# Patient Record
Sex: Female | Born: 1940
Health system: Southern US, Community
[De-identification: ages and names within clinical notes are randomized; demographics above are authoritative.]

## PROBLEM LIST (undated history)

## (undated) DIAGNOSIS — I519 Heart disease, unspecified: Secondary | ICD-10-CM

## (undated) DIAGNOSIS — J189 Pneumonia, unspecified organism: Secondary | ICD-10-CM

## (undated) DIAGNOSIS — I119 Hypertensive heart disease without heart failure: Secondary | ICD-10-CM

## (undated) DIAGNOSIS — R0602 Shortness of breath: Secondary | ICD-10-CM

## (undated) DIAGNOSIS — I10 Essential (primary) hypertension: Secondary | ICD-10-CM

## (undated) DIAGNOSIS — J449 Chronic obstructive pulmonary disease, unspecified: Secondary | ICD-10-CM

## (undated) DIAGNOSIS — E1121 Type 2 diabetes mellitus with diabetic nephropathy: Secondary | ICD-10-CM

## (undated) DIAGNOSIS — N289 Disorder of kidney and ureter, unspecified: Secondary | ICD-10-CM

## (undated) DIAGNOSIS — F329 Major depressive disorder, single episode, unspecified: Secondary | ICD-10-CM

## (undated) DIAGNOSIS — D649 Anemia, unspecified: Secondary | ICD-10-CM

## (undated) DIAGNOSIS — M1990 Unspecified osteoarthritis, unspecified site: Secondary | ICD-10-CM

## (undated) DIAGNOSIS — F32A Depression, unspecified: Secondary | ICD-10-CM

## (undated) DIAGNOSIS — E119 Type 2 diabetes mellitus without complications: Secondary | ICD-10-CM

## (undated) DIAGNOSIS — K436 Other and unspecified ventral hernia with obstruction, without gangrene: Secondary | ICD-10-CM

## (undated) DIAGNOSIS — E1165 Type 2 diabetes mellitus with hyperglycemia: Secondary | ICD-10-CM

## (undated) DIAGNOSIS — F419 Anxiety disorder, unspecified: Secondary | ICD-10-CM

## (undated) DIAGNOSIS — Z8701 Personal history of pneumonia (recurrent): Secondary | ICD-10-CM

## (undated) DIAGNOSIS — I2510 Atherosclerotic heart disease of native coronary artery without angina pectoris: Secondary | ICD-10-CM

## (undated) DIAGNOSIS — K469 Unspecified abdominal hernia without obstruction or gangrene: Secondary | ICD-10-CM

## (undated) DIAGNOSIS — I129 Hypertensive chronic kidney disease with stage 1 through stage 4 chronic kidney disease, or unspecified chronic kidney disease: Secondary | ICD-10-CM

## (undated) HISTORY — DX: Unspecified osteoarthritis, unspecified site: M19.90

## (undated) HISTORY — DX: Essential (primary) hypertension: I10

## (undated) HISTORY — DX: Chronic obstructive pulmonary disease, unspecified: J44.9

## (undated) HISTORY — DX: Anxiety disorder, unspecified: F41.9

## (undated) HISTORY — DX: Depression, unspecified: F32.A

## (undated) HISTORY — DX: Atherosclerotic heart disease of native coronary artery without angina pectoris: I25.10

## (undated) HISTORY — DX: Other and unspecified ventral hernia with obstruction, without gangrene: K43.6

## (undated) HISTORY — DX: Personal history of pneumonia (recurrent): Z87.01

## (undated) HISTORY — DX: Type 2 diabetes mellitus without complications: E11.9

## (undated) HISTORY — PX: NEPHRECTOMY: SHX65

---
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
## 2005-08-30 ENCOUNTER — Emergency Department (HOSPITAL_COMMUNITY): Admission: EM | Admit: 2005-08-30 | Discharge: 2005-08-31 | Payer: Self-pay | Admitting: *Deleted

## 2005-09-01 ENCOUNTER — Ambulatory Visit (HOSPITAL_COMMUNITY): Admission: RE | Admit: 2005-09-01 | Discharge: 2005-09-01 | Payer: Self-pay | Admitting: *Deleted

## 2005-09-08 ENCOUNTER — Encounter (HOSPITAL_COMMUNITY): Admission: RE | Admit: 2005-09-08 | Discharge: 2005-09-08 | Payer: Self-pay | Admitting: Pulmonary Disease

## 2007-07-04 ENCOUNTER — Emergency Department (HOSPITAL_COMMUNITY): Admission: EM | Admit: 2007-07-04 | Discharge: 2007-07-04 | Payer: Self-pay | Admitting: Emergency Medicine

## 2007-07-14 ENCOUNTER — Ambulatory Visit: Payer: Self-pay | Admitting: Cardiovascular Disease

## 2007-07-23 ENCOUNTER — Ambulatory Visit: Payer: Self-pay | Admitting: Internal Medicine

## 2007-07-23 ENCOUNTER — Encounter (HOSPITAL_COMMUNITY): Admission: RE | Admit: 2007-07-23 | Discharge: 2007-08-22 | Payer: Self-pay | Admitting: Cardiovascular Disease

## 2007-07-30 ENCOUNTER — Ambulatory Visit: Payer: Self-pay | Admitting: Gastroenterology

## 2007-08-02 ENCOUNTER — Ambulatory Visit (HOSPITAL_COMMUNITY): Admission: RE | Admit: 2007-08-02 | Discharge: 2007-08-02 | Payer: Self-pay | Admitting: Gastroenterology

## 2007-08-11 ENCOUNTER — Ambulatory Visit: Payer: Self-pay | Admitting: Cardiovascular Disease

## 2007-08-12 ENCOUNTER — Encounter (HOSPITAL_COMMUNITY): Admission: RE | Admit: 2007-08-12 | Discharge: 2007-09-11 | Payer: Self-pay | Admitting: Gastroenterology

## 2007-11-02 ENCOUNTER — Ambulatory Visit: Payer: Self-pay | Admitting: Cardiovascular Disease

## 2008-09-07 ENCOUNTER — Ambulatory Visit: Payer: Self-pay | Admitting: Cardiology

## 2008-12-11 ENCOUNTER — Ambulatory Visit (HOSPITAL_COMMUNITY): Admission: RE | Admit: 2008-12-11 | Discharge: 2008-12-11 | Payer: Self-pay | Admitting: Family Medicine

## 2009-06-13 DIAGNOSIS — E119 Type 2 diabetes mellitus without complications: Secondary | ICD-10-CM

## 2009-06-13 DIAGNOSIS — K219 Gastro-esophageal reflux disease without esophagitis: Secondary | ICD-10-CM | POA: Insufficient documentation

## 2009-06-13 HISTORY — DX: Gastro-esophageal reflux disease without esophagitis: K21.9

## 2009-06-13 HISTORY — DX: Type 2 diabetes mellitus without complications: E11.9

## 2010-01-28 ENCOUNTER — Inpatient Hospital Stay (HOSPITAL_COMMUNITY): Admission: EM | Admit: 2010-01-28 | Discharge: 2010-02-05 | Payer: Self-pay | Admitting: Emergency Medicine

## 2010-05-16 ENCOUNTER — Ambulatory Visit (HOSPITAL_COMMUNITY): Admission: RE | Admit: 2010-05-16 | Discharge: 2010-05-16 | Payer: Self-pay | Admitting: Pulmonary Disease

## 2010-05-30 ENCOUNTER — Emergency Department (HOSPITAL_COMMUNITY): Admission: EM | Admit: 2010-05-30 | Discharge: 2010-05-30 | Payer: Self-pay | Admitting: Emergency Medicine

## 2011-03-02 LAB — HEPATIC FUNCTION PANEL
Bilirubin, Direct: 0.1 mg/dL (ref 0.0–0.3)
Indirect Bilirubin: 0.1 mg/dL — ABNORMAL LOW (ref 0.3–0.9)
Total Bilirubin: 0.2 mg/dL — ABNORMAL LOW (ref 0.3–1.2)

## 2011-03-02 LAB — BASIC METABOLIC PANEL
BUN: 16 mg/dL (ref 6–23)
CO2: 27 mEq/L (ref 19–32)
Calcium: 8.8 mg/dL (ref 8.4–10.5)
Chloride: 104 mEq/L (ref 96–112)
Creatinine, Ser: 1.12 mg/dL (ref 0.4–1.2)
GFR calc non Af Amer: 48 mL/min — ABNORMAL LOW (ref >60)
Potassium: 4.4 mEq/L (ref 3.5–5.1)

## 2011-03-02 LAB — LIPASE, BLOOD: Lipase: 41 U/L (ref 11–59)

## 2011-03-02 LAB — DIFFERENTIAL
Eosinophils Absolute: 0.1 10*3/uL (ref 0.0–0.7)
Eosinophils Relative: 1 % (ref 0–5)
Monocytes Relative: 3 % (ref 3–12)

## 2011-03-02 LAB — URINALYSIS, ROUTINE W REFLEX MICROSCOPIC
Glucose, UA: NEGATIVE mg/dL
Hgb urine dipstick: NEGATIVE
Nitrite: NEGATIVE
Urobilinogen, UA: 0.2 mg/dL (ref 0.0–1.0)

## 2011-03-02 LAB — CBC: Platelets: 268 10*3/uL (ref 150–400)

## 2011-03-05 LAB — URINE CULTURE: Colony Count: NO GROWTH

## 2011-03-05 LAB — GLUCOSE, CAPILLARY
Glucose-Capillary: 120 mg/dL — ABNORMAL HIGH (ref 70–99)
Glucose-Capillary: 141 mg/dL — ABNORMAL HIGH (ref 70–99)
Glucose-Capillary: 198 mg/dL — ABNORMAL HIGH (ref 70–99)
Glucose-Capillary: 200 mg/dL — ABNORMAL HIGH (ref 70–99)
Glucose-Capillary: 214 mg/dL — ABNORMAL HIGH (ref 70–99)
Glucose-Capillary: 234 mg/dL — ABNORMAL HIGH (ref 70–99)
Glucose-Capillary: 256 mg/dL — ABNORMAL HIGH (ref 70–99)
Glucose-Capillary: 267 mg/dL — ABNORMAL HIGH (ref 70–99)
Glucose-Capillary: 284 mg/dL — ABNORMAL HIGH (ref 70–99)
Glucose-Capillary: 303 mg/dL — ABNORMAL HIGH (ref 70–99)
Glucose-Capillary: 305 mg/dL — ABNORMAL HIGH (ref 70–99)
Glucose-Capillary: 329 mg/dL — ABNORMAL HIGH (ref 70–99)
Glucose-Capillary: 335 mg/dL — ABNORMAL HIGH (ref 70–99)
Glucose-Capillary: 343 mg/dL — ABNORMAL HIGH (ref 70–99)
Glucose-Capillary: 364 mg/dL — ABNORMAL HIGH (ref 70–99)
Glucose-Capillary: 368 mg/dL — ABNORMAL HIGH (ref 70–99)
Glucose-Capillary: 392 mg/dL — ABNORMAL HIGH (ref 70–99)
Glucose-Capillary: 413 mg/dL — ABNORMAL HIGH (ref 70–99)

## 2011-03-05 LAB — POCT CARDIAC MARKERS
CKMB, poc: 1.9 ng/mL (ref 1.0–8.0)
Myoglobin, poc: 115 ng/mL (ref 12–200)
Troponin i, poc: <0.05 ng/mL (ref 0.00–0.09)

## 2011-03-05 LAB — URINALYSIS, ROUTINE W REFLEX MICROSCOPIC
Glucose, UA: NEGATIVE mg/dL
Hgb urine dipstick: NEGATIVE
pH: 5 (ref 5.0–8.0)

## 2011-03-05 LAB — BASIC METABOLIC PANEL
BUN: 21 mg/dL (ref 6–23)
Calcium: 8.2 mg/dL — ABNORMAL LOW (ref 8.4–10.5)
Creatinine, Ser: 1.02 mg/dL (ref 0.4–1.2)
GFR calc non Af Amer: 54 mL/min — ABNORMAL LOW (ref >60)
Glucose, Bld: 116 mg/dL — ABNORMAL HIGH (ref 70–99)

## 2011-03-05 LAB — LIPASE, BLOOD: Lipase: 35 U/L (ref 11–59)

## 2011-03-05 LAB — DIFFERENTIAL
Basophils Absolute: 0 10*3/uL (ref 0.0–0.1)
Basophils Absolute: 0 10*3/uL (ref 0.0–0.1)
Eosinophils Relative: 0 % (ref 0–5)
Lymphocytes Relative: 12 % (ref 12–46)
Lymphocytes Relative: 28 % (ref 12–46)
Lymphs Abs: 1 10*3/uL (ref 0.7–4.0)
Neutro Abs: 6.4 10*3/uL (ref 1.7–7.7)
Neutro Abs: 7.7 10*3/uL (ref 1.7–7.7)

## 2011-03-05 LAB — CBC
Hemoglobin: 12.8 g/dL (ref 12.0–15.0)
Platelets: 254 10*3/uL (ref 150–400)
Platelets: 282 10*3/uL (ref 150–400)
RDW: 13.4 % (ref 11.5–15.5)

## 2011-03-05 LAB — COMPREHENSIVE METABOLIC PANEL
Albumin: 3.4 g/dL — ABNORMAL LOW (ref 3.5–5.2)
Alkaline Phosphatase: 61 U/L (ref 39–117)
BUN: 14 mg/dL (ref 6–23)
Chloride: 101 mEq/L (ref 96–112)
Potassium: 4.3 mEq/L (ref 3.5–5.1)
Total Bilirubin: 0.4 mg/dL (ref 0.3–1.2)

## 2011-03-05 LAB — CULTURE, BLOOD (ROUTINE X 2)
Culture: NO GROWTH
Report Status: 2192011

## 2011-03-05 LAB — GLUCOSE, RANDOM: Glucose, Bld: 404 mg/dL — ABNORMAL HIGH (ref 70–99)

## 2011-03-05 LAB — BRAIN NATRIURETIC PEPTIDE: Pro B Natriuretic peptide (BNP): 63.8 pg/mL (ref 0.0–100.0)

## 2011-04-29 NOTE — Assessment & Plan Note (Signed)
[facility]                       [place] CARDIOLOGY OFFICE NOTE   NAME:[name], [name] KITAMURA                       MRN:          [id number]  DATE:08/11/2007                            DOB:          [birth year]    HISTORY OF PRESENT ILLNESS:  [name] [name] returns today for followup. I needed  to go over her results with her. She is a very complicated patient. She  is very fearful of hospitals. She had a fairly bad experience in [place]  with a normal kidney being removed, resulting in deformity and scarring  of her entire abdomen. She is a diabetic who has been having chest pain.  She was referred to me for workup of her heart. Her echocardiogram was  abnormal with an ejection fraction in the 45% to 50% range with lateral  hypokinesis. Her Myoview is read by Dr. Tenny Craw. I have reviewed it.  Unfortunately, there are some hot spots in the inferior wall and  anterior wall. This leaves the lateral wall relatively account poor.  There is a suggestion of a lateral wall infarct, which would match the  regional wall motion abnormality on echocardiogram. There was no  ischemia.   I had a long discussion with [name] [name]. She continues to have somewhat  atypical chest pain. It is sharp. It is in the center of her chest. It  can occur all day long. She takes Tylenol for it and it seems to go  away. She has never had nitroglycerin. She is having ongoing GI workup  and has had a normal esophagram with mild reflux. It sounds like she is  to have an EGD later this week. She is taking her Nexium.   I told [name] [name] that given her diabetes, her chest pain, and her abnormal  echo and Myoview, she should probably have a heart catheterization. She  did not want to have one at this time. She recounted another story of a  relative who bled all over the place with a heart catheterization.   I explained the risks to her including bleeding, infection, contrast  reaction, stroke, need for emergency  surgery. Fro the time being, she  does not want to proceed with a catheterization.   She understands that there is a high likelihood that she has had a  previous myocardial infarction that was silent, due to her diabetes. She  is willing to take the risk.   I told her that I would give her some sublingual nitroglycerin in case  she gets very bad pain.   REVIEW OF SYSTEMS:  Otherwise remarkable for ongoing stomach problems,  which she is having an EGD for. As far as I know, the colonoscopy has  not been scheduled. Otherwise, negative.   MEDICATIONS:  Nexium 40 daily, Glucovance 2 1/2-500 2 tabs bid, aspirin  a day.   PHYSICAL EXAMINATION:  GENERAL:  An overweight and somewhat anxious  white female in no distress.  VITAL SIGNS:  Blood pressure 120/72, weight 177. Pulse 96 and regular.  She is afebrile. Respiratory rate 14.  HEENT:  Normal.  NECK:  Carotids normal without bruits. No lymphadenopathy,  no  thyromegaly, and no JVP elevation.  LUNGS:  Clear with good diaphragmatic motion. No wheezing.  CARDIOVASCULAR:  There is an S1 and S2 with distant heart sounds.  ABDOMEN:  Protuberant and grossly deformed. She has some prolapse of  small bowel into the left lower quadrant. She has a huge scar on her  left flank. She is status post right nephrectomy. I cannot palpate her  aorta. There is no current tenderness. Bowel sound are throughout the  mid epigastric area and left lower quadrant. There is no  hepatosplenomegaly. No hepatojugular reflux and no AAA. Femoral's are  difficult to palpate. Distal pulses are intact with trace edema.  NEURO:  Non-focal. There is no muscular weakness.   LABORATORY DATA:  Her baseline EKG shows sinus rhythm with no previous  infarction.   IMPRESSION/PLAN:  1. Chest pain, atypical. However, abnormal echocardiogram and Myoview.      The patient declining catheterization at this time. Nitroglycerin      given in case symptoms worsen. Continue an aspirin  a day.  2. Decreased left ventricular function in the setting of diabetes.      Start Lisinopril 10 mg daily. Followup B-met in about a month. I      explained to the patient that this will help her left ventricular      function, lower her blood pressure and also protect her kidneys.  3. Diabetes. Followup with Dr. Juanetta Gosling. Hemoglobin A1C quarterly.      Currently creatinine in good shape with no retinopathy.  4. Gastrointestinal symptoms including reflux, mild on esophagram.      Followup EGD to be done this week. The patient will probably need a      screening colonoscopy as well.   Again, the patient understands that there is a risk involved with not  proceed with heart catheterization but given her horrendous experiences  in hospital, she prefers to wait. I will see her in 3 months unless she  calls [place] with increasing pain.     Noralyn Pick. Eden Emms, MD, [facility]  Electronically Signed    PCN/MedQ  DD: 08/11/2007  DT: 08/11/2007  Job #: [id number]

## 2011-04-29 NOTE — Assessment & Plan Note (Signed)
NAMEMarland Kitchen  ANASTYN, AYARS                 CHART#:  [id number]   DATE:  07/30/2007                       DOB:  May 18, 1941   REFERRING PHYSICIAN:  Oneal Deputy. Juanetta Gosling, M.D.   REASON FOR CONSULTATION:  Reflux.   HISTORY OF PRESENT ILLNESS:  Ms. [name] [name] is a 70 year old female who  reports having a surgery in 1975 that will not allow her to vomit or  feel the urge to have a bowel movement.  She often has accidents because  she does not know when her rectum is full of stool.  She subsequently  has seepage.  She reports having trouble swallowing off and on over the  years, but has gotten worse over the last month or 2.  She was started  on Nexium.  The Nexium has helped.  She states, when she bends over to  pick something up, she gets dizzy and hurts in her chest.  She can  heave.  She denies any heartburn or indigestion.  Sometimes, she gets  strangled on her own saliva.  She states, when she gets strangled on the  saliva, she cannot breathe.  She has had no weight loss.  When she  drinks something really cold, she feels it bubbling up in her esophageus  and the food comes back.  She has never had a colonoscopy.  She denies  any black or tarry stool, or blood in her stool.  Her last upper  endoscopy was years ago.  She has 0 to 3 bowel movements daily.  Her  stools vary in consistency from hard to normal, to loose.  She also may  have a heaviness in her chest periodically.   PAST MEDICAL HISTORY:  1. Diabetes diagnosed approximately 2 to 3 years ago.  2. Ulcers in the 1960s.  3. Phlebitis.  4. Arthritis.   PAST SURGICAL HISTORY:  Nephrectomy in 1975 with complications.   ALLERGIES:  Penicillin, iodine, and codeine.   MEDICATIONS:  1. Aspirin 81 mg daily.  2. Nexium 40 mg daily.  3. Glyburide/metformin 2.5/500 two twice daily.  4. Multivitamins.   FAMILY HISTORY:  She states her mother had throat cancer and breast  cancer.  Her dad died of a myocardial infarction.  She has no family  history of colon cancer.   SOCIAL HISTORY:  She is widowed and has 1 daughter.  Her home phone  number is [phone].  She was employed until 1975 and then she became  disabled.  She attributes her recovery to God.  She denies any tobacco  or alcohol use.   REVIEW OF SYSTEMS:  As per the HPI.  All other systems otherwise  negative.   PHYSICAL EXAM:  Weight 174 pounds, height 5 feet, BMI 34 (obese).  Temperature 98.3, blood pressure 115/70.  Pulse 88.  GENERAL:  She is in no apparent distress.  Alert and oriented x4.HEENT:  Normocephalic, atraumatic.  Pupils equal, round, and reactive to light.  Mouth with no oral lesions.  Posterior pharynx without erythema or  exudate.NECK:  Full range of motion.  No lymphadenopathy.  LUNGS:  Clear  to auscultation bilaterally.  CARDIOVASCULAR:  Irregular rhythm.  No  murmur.  Normal S1 and S2.  Upon attempting to examine her abdomen, she  tensed up before I even started my exam.  She stated that touching  her  abdomen causes her excruciating pain and it has been like that for  years.  She reported mild tenderness to palpation in the right upper  quadrant and the right lower quadrant.  She had moderate tenderness to  palpation in the epigastrium, left upper quadrant, and left lower  quadrant.  She has multiple incisions on her abdomen, which are well-  healed.  She does have distortion of her abdominal contour, which is  consistent with abdominal wall defects.  Unable to really appreciate  hernias, but the exam is limited secondary to her complaint of  discomfort during the exam.  EXTREMITIES:  Without cyanosis, clubbing,  or edema.  NEURO:  She has no focal neurologic deficits.   ASSESSMENT:  Ms. Leu is a 70 year old female who complains of gastric  contents bubbling up into the back of her throat, which is consistent  with regurgitation.  Differential diagnoses include esophageal motility  disorder, primary or secondary, gastric outlet obstruction  in patient  who has had previous surgery, small bowel obstruction in a patient who  has had a previous surgery, and low-likelihood of delayed gastric  emptying.  Her symptoms are atypical for gastroesophageal reflux  disease, but if she has had symptomatic improvement, then she should  continue her Nexium.  Thank you for allowing me to see Ms. Botero in  consultation.  My recommendations are to follow.   RECOMMENDATIONS:  1. We will schedule an upper GI with small bowel follow through next      week.  If the etiology for her regurgitation is not identified,      then she will have a gastric emptying study.  2. If a gastric emptying study and her small bowel follow through do      not identify a reason for her regurgitation, then an EGD will be      performed.  3. She is to continue her Nexium.  4. She should follow the [facility] handout recommendations.  5. She has a return patient visit to see me in 2 months and we will      discuss the need for screening colonoscopy.  Performing a      colonoscopy on her may be challenging due to her prior surgical      history.      Kassie Mends, M.D.  Electronically Signed    SM/MEDQ  D:  07/30/2007  T:  07/31/2007  Job:  [id number]   cc:   Ramon Dredge L. Juanetta Gosling, M.D.

## 2011-04-29 NOTE — Assessment & Plan Note (Signed)
[facility]                       [place] CARDIOLOGY OFFICE NOTE   NAME:[name] [name], [name] [name]                       MRN:          [id number]  DATE:07/14/2007                            DOB:          Jun 02, 1941    [name] [name] is an unfortunate 70 year old patient referred by Dr. Juanetta Gosling  for chest pain.  The patient was in the emergency room at [facility] 2  weeks ago.  She had substernal chest pain that was atypical.  It had  lasted 48 hours.  She ruled out for myocardial infarction.  She was sent  home with a diagnosis of question of TT syndrome or costochondritis.   The patient is a fairly new diabetic over the last year or 2.  She has  unknown cholesterol status.  She is not hypertensive.  Family history is  positive with the father having an MI at age 32.  The patient had has  chest pain over the last few months.  It tends to be atypical.  It is  actually made worse by eating.  She had significant reflux, and that  also is made worse by bending over.   She does occasionally get it with exercise.   She has some chronic exertional dyspnea.  There is no diagnosis of COPD.  She does not smoke.  However, she does probably have some restrictive  lung disease given her body habitus.   The patient felt her chest pain was progressive, culminating in her ER  visit 2 weeks ago.  It has actually not been that bad over the last  week.  She does not think antacids have helped the pain.  She has not  been taking any nonsteroidals.   Most of the time, she just sits quietly and it passes on its own in a  matter of minutes to an hour.   In regard to her history of reflux, she definitely has this.  She has  been on Nexium.  She does have some mild epigastric pain and food  definitely can bring on her pain.   The patient's past medical history is somewhat sad.  She apparently had  a left kidney removed in 1975 at [place].  The patient seems to  indicate that her  kidney was removed for no reason.  She also is very  emotional about the experience saying that the doctor was selling  kidneys to people, and it also sounds as though she was left with an  open wound and has had chronic debility from this surgery.  We actually  spent about 15 minutes talking about this experience, as it is quite  tragic for the patient and has ruined her life as far as she can tell.   PAST MEDICAL HISTORY:  Otherwise, remarkable for diabetes and reflux.   She is allergic to PENICILLIN.   Social: The patient is disabled.  She likes to sew, cook, and do crafts.  She does walk on occasion.  She is widowed.  She has 1 child.  She does  not drink. She does not smoke   Family History non-contributory  MEDICATIONS:  1. Nexium 40 a day.  2. Glucovance 2.5/500 two tabs b.i.d.  3. Multivitamins.  4. An aspirin a day.   EXAM:  Remarkable for a morbidly obese white female in no distress.  Weight is 169, blood pressure 132/66, pulse 90 and regular, respiratory  rate is 14.  She is afebrile.  HEENT:  Normal.  Carotids normal without bruit.  There is no  lymphadenopathy.  No thyromegaly.  No JVP elevation.  LUNGS:  Clear with reasonable diaphragmatic motion.  No wheezing.  There is an S1, S2 with normal heart sounds.  PMI is normal.  Her entire abdominal exam is grossly abnormal.  She appears to have  continued prolapse of some bowel in the left lower quadrant.  She has a  huge scar on the left flank.  She obviously had a horrendous experience  with her nephrectomy.  I cannot palpate an AAA.  There is no  hepatosplenomegaly or hepatojugular reflux.  There is no tenderness and  bowel sounds are positive.  Her popliteal are +2 bilaterally, as are her  PTs.  There is no lower extremity edema.  NEURO:  Nonfocal.  There is no muscular weakness.   Her baseline EKG shows sinus tachycardia at a rate of 100 with poor R  wave progression, nonspecific ST-T wave changes.    IMPRESSION:  1. Chest pain is atypical.  She has required an emergency room visit.      Although it may be difficult, we will see if we can do an adenosine      Myoview on the patient.  I think this is particularly important      given that she has had chest pain necessitating an emergency room      visit and is a diabetic.  2. Resting tachycardia.  I suspect it is due to her anxiety and her      diabetes with some autonomic dysfunction.  She may benefit from      some low-dose core b.i.d.  We will see what her left ventricular      function is by echo and make further recommendations on this.  3. Symptoms do seem like they may be related to reflux.  She will      continue her Nexium.  It may be worthwhile for her to have a      swallowing study in the future.  4. She needs a yearly followup for her creatinine.  She is status post      nephrectomy and only has 1 kidney.  She is a diabetic.  I think it      may also be worthwhile to place her on low-dose ACE inhibitor to      protect this kidney from renal failure.  I will leave this up to      Dr. Juanetta Gosling.  5. Exertional dyspnea.  It sounds functional and related to her      weight.  We will check a 2D echocardiogram to make sure she has      normal left ventricular function, particularly in light of her      resting tachycardia.  6. Emotional lability.  The patient may benefit from Prozac.  She has      clearly been scarred by this experience over 20 years ago with a      Careers adviser at [facility].  She has grossly disfiguring scars in her      abdomen and I am not sure if she has  ever seen a Engineer, petroleum,      but appears to have protruding bowel still in the left lower      quadrant.  This may have something to do with her reflux.   However, her current chest pain symptoms appear to be benign and most  likely related to reflux or costochondritis.   Further recommendations will be based on the results of her echo and  stress  test.     Theron Arista C. Eden Emms, MD, [facility]  Electronically Signed    PCN/MedQ  DD: 07/14/2007  DT: 07/15/2007  Job #: [id number]   cc:   Ramon Dredge L. Juanetta Gosling, M.D.

## 2011-04-29 NOTE — Assessment & Plan Note (Signed)
[facility]                       [place] CARDIOLOGY OFFICE NOTE   NAME:[name] [name], Spoto                       MRN:          [id number]  DATE:11/02/2007                            DOB:          30-Dec-1940    Ms. [name] [name] returns today in followup.  She is her usual anxious self.  She probably has coronary artery disease but has refused  catheterization.  She had a Myoview study done on July 23, 2007, which  showed lateral wall scar with an EF of 51%.  There was no real ischemia.  She has not been having significant chest pain.  She has not had to take  nitroglycerin.  She is a diabetic.  She had a personal family member  have a complication at catheterization and has not wanted to proceed  with this.  We have not had her on beta blockers due to relatively low  heart rates.  Apparently she has been somewhat swimmy headed lately, and  her lisinopril was cut back to 5 mg a day.   REVIEW OF SYSTEMS:  Otherwise remarkable for significant peripheral  neuropathy from her diabetes.  She has not had any hypoglycemic  reactions.  Otherwise negative.   CURRENT MEDICATIONS:  1. Nexium 40 mg a day.  2. Glucovance  2.5/500 two tablets b.i.d.  3. Aspirin 1 a day.  4. Lisinopril 5 a day.   PHYSICAL EXAMINATION:  GENERAL:  Remarkable for an elderly white female  in no distress.  VITAL SIGNS:  Weight is 175.  Blood pressure 110/70, pulse 80 and  regular, afebrile, respiratory rate 14.  HEENT:  Unremarkable.  NECK:  No carotid bruits, no lymphadenopathy, thyromegaly, or JVP  elevation.  LUNGS:  Clear to diaphragmatic motion, no wheezing.  CARDIAC:  S1, S2 with normal heart sounds.  PMI normal.  ABDOMEN:  Benign.  Bowel sounds positive.  No hepatosplenomegaly or  hepatojugular reflux.  EXTREMITIES:  Distal pulses intact,  no edema.  She does have peripheral  neuropathy below the ankles.  SKIN:  Warm and dry.  MUSCULOSKELETAL:  No muscular weakness.   IMPRESSION:  1. Presumed coronary artery disease in a diabetic, treated  medically.      Continue aspirin. Relatively low resting heart rate.  Consider      adding beta blocker if chest pain increases.  She has nitroglycerin      at home.  She will call me if she has any prolonged episodes of      pain. She will also let us know if she reconsiders heart      catheterization.  2. Diabetes, currently well controlled.  Hemoglobin A1c quarterly.      Continue Glucovance.  3. Relative hypotension on higher dose lisinopril.  Maintain 5 mg dose      related to protecting      kidneys in the setting of diabetes.  4. Reflux.  Continue Nexium 40 a day, avoid spicy foods and late-night      meals.   I will see the patient back in 6 months.     Noralyn Pick. Eden Emms, MD, [facility]  Electronically Signed    PCN/MedQ  DD: 11/02/2007  DT: 11/02/2007  Job #: [id number]

## 2011-04-29 NOTE — Assessment & Plan Note (Signed)
[facility]                       [place] CARDIOLOGY OFFICE NOTE   NAME:[name] [name]                       MRN:          [id number]  DATE:09/07/2008                            DOB:          [birth year]    PRIMARY CARE PHYSICIAN:  [name] Dredge L. Juanetta Gosling, MD   REASON FOR VISIT:  Cardiac followup.   HISTORY OF PRESENT ILLNESS:  This is my first meeting with [name] [name].  She was last seen by Dr. Tenny Craw back in November 2008.  She has been  managed medically for apparent underlying cardiovascular disease based  on a previous abnormal Myoview done back in August 2008 demonstrating  scar in the lateral wall with an ejection fraction of 51%.  Concurrent  wall motion abnormalities were also noted by echocardiography.  A  cardiac catheterization was discussed, although she has been fairly  adamant in refusal to proceed with this.  Symptomatically, she reports  occasional chest pain, although has not had to use any sublingual  nitroglycerin.  Her medicines are outlined below and have been  relatively stable.  Labs from March 2009 showed a potassium of 4.9, BUN  19, and creatinine of 0.9.  I did not see any recent lipids.  Lipid  profile from 2008 showed fairly substantial hypertriglyceridemia at 1048  with HDL of 36.  LDL was not calculated.  She is not on any statin  medications or omega-3 supplements.  I spoke with [name] [name] today about  repeating lipid studies and perhaps considering therapy depending on her  numbers.  This will be particularly important in light of her diabetes  mellitus and plans for medical therapy for cardiovascular disease.  Otherwise, she seems to be hemodynamically stable.   ALLERGIES:  PENICILLIN.   PRESENT MEDICATIONS:  1. Glucovance 2.5/500 mg 2 tablets p.o. b.i.d.  2. Multivitamin 1 p.o. daily.  3. Aspirin 81 mg p.o. daily.  4. Lisinopril 5 mg p.o. daily.  5. Omeprazole 20 mg p.o. b.i.d.  6. Azithromycin 250 mg p.o. daily for  5-day course.  7. Sublingual nitroglycerin 0.5 mg p.r.n.   REVIEW OF SYSTEMS:  As described in the history of present illness.  Otherwise negative.   PHYSICAL EXAMINATION:  VITAL SIGNS:  Blood pressure 98/60, heart rate  78, weight is 172 pounds, which is stable.  GENERAL:  This is an overweight woman, short in stature, very  loquacious, in no acute distress.  HEENT:  Conjunctiva is normal.  Oropharynx clear.  NECK:  Supple.  No elevated jugular venous pressure.  No audible bruits.  No thyromegaly is noted.  LUNGS:  Clear without labored breathing at rest.  CARDIAC:  Regular rate and rhythm.  No pathologic murmur or S3 gallop.  ABDOMEN:  Obese, nontender.  No obvious tenderness.  Bowel sounds are  present.  She has a left lateral scar apparently some degree of  abdominal herniation on that side.  EXTREMITIES:  Exhibit chronic-appearing trace edema.  Some mild venous  stasis.  Distal pulse is 1+.  SKIN:  Warm and dry.  MUSCULOSKELETAL:  No kyphosis noted.  NEUROPSYCHIATRIC:  The patient is alert and  oriented x3, somewhat  anxious.   IMPRESSION AND RECOMMENDATIONS:  1. Presumed underlying coronary artery disease based on previous      abnormal Myoview and echocardiogram as outlined.  [name] [name] is      fairly adamant and refusal to proceed with any invasive cardiac      assessment and prefers medical therapy and observation at this      point.  This was reiterated today.  In reviewing her medications, I      suggested that she have a followup lipid profile.  She may well      benefit from statin therapy as well as possibly even need      additional treatment for hypertriglyceridemia.  We will obtain      these labs and can make subsequent recommendations.  Otherwise, I      encouraged her to walk as tolerated and remain compliant with her      medications.  She does not smoke.  2. Follow up will be arranged over the next 6 months.     Jonelle Sidle, MD  Electronically  Signed    SGM/MedQ  DD: 09/07/2008  DT: 09/07/2008  Job #: [id number]   cc:   [name] [name], M.D.

## 2011-04-29 NOTE — Procedures (Signed)
NAMEALEXZA, [name] [name] NO.:  [id number]   MEDICAL RECORD NO.:  [id number]          PATIENT TYPE:  REC   LOCATION:  RAD                           FACILITY:  [facility]   PHYSICIAN:  Pricilla Riffle, MD, FACCDATE OF BIRTH:  January 25, 1941   DATE OF PROCEDURE:  07/23/2007  DATE OF DISCHARGE:                                ECHOCARDIOGRAM   PROCEDURES PERFORMED:  1. Two-dimensional echocardiogram; with,  2. Echocardiographic Doppler.   CARDIOLOGIST:  Pricilla Riffle, M.D.,  [facility]   INDICATIONS:  The patient is a 70 year old being evaluate for chest  pain.  The patient has diabetes; and, has  no other prior history of  cardiac problems.   DESCRIPTION OF THE PROCEDURE:  The left ventricle was normal in size  with an end-diastolic dimension of 45 mm.  The intraventricular septum  and posterior wall were mildly thickened at 12 mm each.  The left atrium  was normal at 37 mm.  The aortic root was normal at 29 mm.  The right  atrium and the right ventricle were grossly normal.   The aortic valve was mildly sclerotic.  There was no stenosis and no  insufficiency.  The mitral valve was mildly thickened with mild annular  calcification.  There was moderate insufficiency directed more  posteriorly, 2/4 in severity.  Pulmonic valve was normal with no  insufficiency.  The tricuspid valve was normal with trace insufficiency.   The acoustic windows were difficult, but overall LV systolic function  appeared to be mildly depressed at approximately 45-50%.  Of note, there  is hypokinesis noted in the lateral wall and also hypokinesis in the  posterior wall, mid distal.   Overall RV systolic function appeared to be normal.   On subcostal views there was no pericardial effusion. __________  function was grossly normal.   No pericardial effusion was seen.      Pricilla Riffle, MD, [facility]  Electronically Signed     PVR/MEDQ  D:  07/23/2007  T:  07/25/2007  Job:  [id number]   cc:   Ramon Dredge L.  Juanetta Gosling, M.D.  Fax: [phone]

## 2012-01-06 DIAGNOSIS — E119 Type 2 diabetes mellitus without complications: Secondary | ICD-10-CM | POA: Diagnosis not present

## 2012-01-06 DIAGNOSIS — E1149 Type 2 diabetes mellitus with other diabetic neurological complication: Secondary | ICD-10-CM | POA: Diagnosis not present

## 2012-02-12 DIAGNOSIS — I2510 Atherosclerotic heart disease of native coronary artery without angina pectoris: Secondary | ICD-10-CM | POA: Diagnosis not present

## 2012-02-12 DIAGNOSIS — J449 Chronic obstructive pulmonary disease, unspecified: Secondary | ICD-10-CM | POA: Diagnosis not present

## 2012-03-12 ENCOUNTER — Emergency Department (HOSPITAL_COMMUNITY): Payer: Medicare Other

## 2012-03-12 ENCOUNTER — Encounter (HOSPITAL_COMMUNITY): Payer: Self-pay

## 2012-03-12 ENCOUNTER — Emergency Department (HOSPITAL_COMMUNITY)
Admission: EM | Admit: 2012-03-12 | Discharge: 2012-03-12 | Disposition: A | Discharge status: Home / Self Care | Payer: Medicare Other | Attending: Emergency Medicine | Admitting: Emergency Medicine

## 2012-03-12 DIAGNOSIS — R10814 Left lower quadrant abdominal tenderness: Secondary | ICD-10-CM | POA: Diagnosis not present

## 2012-03-12 DIAGNOSIS — IMO0002 Reserved for concepts with insufficient information to code with codable children: Secondary | ICD-10-CM | POA: Diagnosis not present

## 2012-03-12 DIAGNOSIS — I10 Essential (primary) hypertension: Secondary | ICD-10-CM | POA: Diagnosis not present

## 2012-03-12 DIAGNOSIS — M542 Cervicalgia: Secondary | ICD-10-CM | POA: Diagnosis not present

## 2012-03-12 DIAGNOSIS — E119 Type 2 diabetes mellitus without complications: Secondary | ICD-10-CM | POA: Diagnosis not present

## 2012-03-12 DIAGNOSIS — M62838 Other muscle spasm: Secondary | ICD-10-CM | POA: Insufficient documentation

## 2012-03-12 DIAGNOSIS — M47812 Spondylosis without myelopathy or radiculopathy, cervical region: Secondary | ICD-10-CM | POA: Diagnosis not present

## 2012-03-12 HISTORY — DX: Disorder of kidney and ureter, unspecified: N28.9

## 2012-03-12 HISTORY — DX: Essential (primary) hypertension: I10

## 2012-03-12 LAB — BASIC METABOLIC PANEL WITH GFR
BUN: 18 mg/dL (ref 6–23)
CO2: 27 meq/L (ref 19–32)
Calcium: 9.7 mg/dL (ref 8.4–10.5)
Chloride: 99 meq/L (ref 96–112)
Creatinine, Ser: 0.98 mg/dL (ref 0.50–1.10)
GFR calc Af Amer: 66 mL/min — ABNORMAL LOW
GFR calc non Af Amer: 57 mL/min — ABNORMAL LOW
Glucose, Bld: 214 mg/dL — ABNORMAL HIGH (ref 70–99)
Potassium: 4.7 meq/L (ref 3.5–5.1)
Sodium: 137 meq/L (ref 135–145)

## 2012-03-12 LAB — DIFFERENTIAL
Basophils Absolute: 0 K/uL (ref 0.0–0.1)
Basophils Relative: 0 % (ref 0–1)
Eosinophils Absolute: 0.1 K/uL (ref 0.0–0.7)
Eosinophils Relative: 1 % (ref 0–5)
Lymphocytes Relative: 19 % (ref 12–46)
Lymphs Abs: 1.8 K/uL (ref 0.7–4.0)
Monocytes Absolute: 0.5 K/uL (ref 0.1–1.0)
Monocytes Relative: 5 % (ref 3–12)
Neutro Abs: 6.8 K/uL (ref 1.7–7.7)
Neutrophils Relative %: 74 % (ref 43–77)

## 2012-03-12 LAB — CBC
Hemoglobin: 12.9 g/dL (ref 12.0–15.0)
MCH: 31.3 pg (ref 26.0–34.0)
RBC: 4.12 MIL/uL (ref 3.87–5.11)
WBC: 9.2 10*3/uL (ref 4.0–10.5)

## 2012-03-12 MED ORDER — HYDROMORPHONE HCL PF 1 MG/ML IJ SOLN
1.0000 mg | Freq: Once | INTRAMUSCULAR | Status: AC
  Administered 2012-03-12: 1 mg via INTRAMUSCULAR
  Filled 2012-03-12: qty 1

## 2012-03-12 MED ORDER — HYDROCODONE-ACETAMINOPHEN 5-325 MG PO TABS: 1.0000 | ORAL_TABLET | Freq: Four times a day (QID) | ORAL | Status: AC | PRN

## 2012-03-12 MED ORDER — CYCLOBENZAPRINE HCL 10 MG PO TABS: 10.0000 mg | ORAL_TABLET | Freq: Three times a day (TID) | ORAL | Status: AC | PRN

## 2012-03-12 NOTE — ED Notes (Signed)
C/o rt sided neck pain that began last week and has progressively gotten worse.

## 2012-03-12 NOTE — Discharge Instructions (Signed)
Follow up with your md next week for reckeck

## 2012-03-12 NOTE — ED Provider Notes (Signed)
History   This chart was scribed for [name] Lennert, MD by [name] [name]. The patient was seen in room APA07/APA07 and the patient's care was started at 4:09PM.    CSN: [id number]  Arrival date & time 03/12/12  1544   First MD Initiated Contact with Patient 03/12/12 1605      Chief Complaint  Patient presents with  . Neck Pain    (Consider location/radiation/quality/duration/timing/severity/associated sxs/prior treatment) HPI [name] [name] is a 71 y.o. female who presents to the Emergency Department complaining of constant, moderate to severe right-sided neck pain with an onset 3 days ago. Pt stated that she woke up that morning with what she thought was a crick in her neck and presumed that the stiffness would go away; however, the pain and stiffness has both gotten progressively worse. Pt has been recently sick and has been to her PCP. Rotation of her head to the right side aggravates the pain. No HA, fever, cough, back pain, n/v/d, or extremity pain. Hx of HTN and diabetes mellitus. Allergic to penicillin. No other pertinent medical problems.  PCP: [name] [name]   Past Medical History  Diagnosis Date  . Hypertension   . Diabetes mellitus   . Renal disorder     Past Surgical History  Procedure Date  . Nephrectomy     No family history on file.  History  Substance Use Topics  . Smoking status: Never Smoker   . Smokeless tobacco: Not on file  . Alcohol Use: No    OB History    Grav Para Term Preterm Abortions TAB SAB Ect Mult Living                  Review of Systems 10 Systems reviewed and all are negative for acute change except as noted in the HPI.   Allergies  Codeine; Contrast media; Morphine and related; Other; and Penicillins  Home Medications   Current Outpatient Rx  Name Route Sig Dispense Refill  . OMEGA-3 FATTY ACIDS 1000 MG PO CAPS Oral Take 1 g by mouth 2 (two) times daily.    . GLYBURIDE-METFORMIN 2.5-500 MG PO TABS Oral Take 2 tablets by  mouth 2 (two) times daily.    Marland Kitchen LOSARTAN POTASSIUM 50 MG PO TABS Oral Take 25 mg by mouth daily.    . ADULT MULTIVITAMIN W/MINERALS CH Oral Take 1 tablet by mouth daily.    Docia Barrier IN Inhalation Inhale into the lungs at bedtime as needed.    Marland Kitchen SITAGLIPTIN PHOSPHATE 100 MG PO TABS Oral Take 100 mg by mouth daily.      BP 137/71  Pulse 106  Temp(Src) 98.7 F (37.1 C) (Oral)  Resp 22  Ht 5' (1.524 m)  Wt 180 lb (81.647 kg)  BMI 35.15 kg/m2  SpO2 96%  Physical Exam  Nursing note and vitals reviewed. Constitutional: She is oriented to person, place, and time. She appears well-developed and well-nourished.       Awake, alert, nontoxic appearance.  HENT:  Head: Normocephalic and atraumatic.  Eyes: EOM are normal. Pupils are equal, round, and reactive to light. Right eye exhibits no discharge. Left eye exhibits no discharge.  Neck: Normal range of motion. Neck supple. No tracheal deviation present.       Right posterior lateral neck tenderness with minimal swelling.  Cardiovascular: Normal rate and regular rhythm.   Pulmonary/Chest: Effort normal. She exhibits no tenderness.  Abdominal: Soft. There is tenderness (Mild LLQ tenderness). There is no rebound.  Musculoskeletal: She exhibits no tenderness.       Baseline ROM, no obvious new focal weakness.  Lymphadenopathy:    She has no cervical adenopathy.  Neurological: She is alert and oriented to person, place, and time.       Mental status and motor strength appears baseline for patient and situation.  Skin: Skin is warm. No rash noted.  Psychiatric: She has a normal mood and affect. Her behavior is normal.    ED Course  Procedures (including critical care time)  DIAGNOSTIC STUDIES: Oxygen Saturation is 96% on room air, adequate by my interpretation.    COORDINATION OF CARE:  4:14PM - EDMD will order pain meds and a neck XR for the pt. Dr. Presumes this is muscle-related. 8:22PM - recheck; pt feels better; ready for  d/c  Results for orders placed during the hospital encounter of 03/12/12  CBC      Component Value Range   WBC 9.2  4.0 - 10.5 (K/uL)   RBC 4.12  3.87 - 5.11 (MIL/uL)   Hemoglobin 12.9  12.0 - 15.0 (g/dL)   HCT 40.9  81.1 - 91.4 (%)   MCV 93.2  78.0 - 100.0 (fL)   MCH 31.3  26.0 - 34.0 (pg)   MCHC 33.6  30.0 - 36.0 (g/dL)   RDW 78.2  95.6 - 21.3 (%)   Platelets 308  150 - 400 (K/uL)  DIFFERENTIAL      Component Value Range   Neutrophils Relative 74  43 - 77 (%)   Neutro Abs 6.8  1.7 - 7.7 (K/uL)   Lymphocytes Relative 19  12 - 46 (%)   Lymphs Abs 1.8  0.7 - 4.0 (K/uL)   Monocytes Relative 5  3 - 12 (%)   Monocytes Absolute 0.5  0.1 - 1.0 (K/uL)   Eosinophils Relative 1  0 - 5 (%)   Eosinophils Absolute 0.1  0.0 - 0.7 (K/uL)   Basophils Relative 0  0 - 1 (%)   Basophils Absolute 0.0  0.0 - 0.1 (K/uL)  BASIC METABOLIC PANEL      Component Value Range   Sodium 137  135 - 145 (mEq/L)   Potassium 4.7  3.5 - 5.1 (mEq/L)   Chloride 99  96 - 112 (mEq/L)   CO2 27  19 - 32 (mEq/L)   Glucose, Bld 214 (*) 70 - 99 (mg/dL)   BUN 18  6 - 23 (mg/dL)   Creatinine, Ser 0.86  0.50 - 1.10 (mg/dL)   Calcium 9.7  8.4 - 57.8 (mg/dL)   GFR calc non Af Amer 57 (*) >90 (mL/min)   GFR calc Af Amer 66 (*) >90 (mL/min)     Ct Soft Tissue Neck Wo Contrast  03/12/2012  *RADIOLOGY REPORT*  Clinical Data: Severe right-sided posterior neck pain for past few days.  CT NECK WITHOUT CONTRAST  Technique:  Multidetector CT imaging of the neck was performed without intravenous contrast.  Comparison: No priors.  Findings: No focal soft tissue masses, adenopathy, or abnormal fluid collections are identified on this noncontrast examination. This cervical spine itself is normal in appearance, with exception of some mild multilevel degenerative disc disease and facet arthropathy.  Specifically, no displaced C-spine fractures, and no acute malalignment. Mild straightening of the normal cervical lordosis is likely  positional.  Prevertebral soft tissues are normal.  The visualized lung apices are unremarkable.  Atherosclerotic calcifications are noted throughout the visualized vasculature.  IMPRESSION: 1.  No acute radiographic abnormality within the neck  to account for the patient's symptoms. 2.  Mild multilevel degenerative disc disease and cervical spondylosis.  Original Report Authenticated By: Florencia Reasons, M.D.     No diagnosis found.  Pt improved with tx  MDM  Normal ct.  Muscle spasm  The chart was scribed for me under my direct supervision.  I personally performed the history, physical, and medical decision making and all procedures in the evaluation of this patient.[name] Lennert, MD 03/12/12 2025

## 2012-03-17 DIAGNOSIS — J3089 Other allergic rhinitis: Secondary | ICD-10-CM | POA: Diagnosis not present

## 2012-03-17 DIAGNOSIS — M542 Cervicalgia: Secondary | ICD-10-CM | POA: Diagnosis not present

## 2012-03-17 DIAGNOSIS — J449 Chronic obstructive pulmonary disease, unspecified: Secondary | ICD-10-CM | POA: Diagnosis not present

## 2012-03-23 DIAGNOSIS — E119 Type 2 diabetes mellitus without complications: Secondary | ICD-10-CM | POA: Diagnosis not present

## 2012-03-23 DIAGNOSIS — E1149 Type 2 diabetes mellitus with other diabetic neurological complication: Secondary | ICD-10-CM | POA: Diagnosis not present

## 2012-05-12 DIAGNOSIS — I2510 Atherosclerotic heart disease of native coronary artery without angina pectoris: Secondary | ICD-10-CM | POA: Diagnosis not present

## 2012-05-12 DIAGNOSIS — F411 Generalized anxiety disorder: Secondary | ICD-10-CM | POA: Diagnosis not present

## 2012-05-12 DIAGNOSIS — F329 Major depressive disorder, single episode, unspecified: Secondary | ICD-10-CM | POA: Diagnosis not present

## 2012-06-01 DIAGNOSIS — E119 Type 2 diabetes mellitus without complications: Secondary | ICD-10-CM | POA: Diagnosis not present

## 2012-06-01 DIAGNOSIS — E1149 Type 2 diabetes mellitus with other diabetic neurological complication: Secondary | ICD-10-CM | POA: Diagnosis not present

## 2012-06-01 DIAGNOSIS — H52229 Regular astigmatism, unspecified eye: Secondary | ICD-10-CM | POA: Diagnosis not present

## 2012-06-01 DIAGNOSIS — H2510 Age-related nuclear cataract, unspecified eye: Secondary | ICD-10-CM | POA: Diagnosis not present

## 2012-06-01 DIAGNOSIS — H524 Presbyopia: Secondary | ICD-10-CM | POA: Diagnosis not present

## 2012-08-10 DIAGNOSIS — E1149 Type 2 diabetes mellitus with other diabetic neurological complication: Secondary | ICD-10-CM | POA: Diagnosis not present

## 2012-08-10 DIAGNOSIS — E119 Type 2 diabetes mellitus without complications: Secondary | ICD-10-CM | POA: Diagnosis not present

## 2012-08-11 DIAGNOSIS — I259 Chronic ischemic heart disease, unspecified: Secondary | ICD-10-CM | POA: Diagnosis not present

## 2012-08-11 DIAGNOSIS — J449 Chronic obstructive pulmonary disease, unspecified: Secondary | ICD-10-CM | POA: Diagnosis not present

## 2012-08-11 DIAGNOSIS — E109 Type 1 diabetes mellitus without complications: Secondary | ICD-10-CM | POA: Diagnosis not present

## 2012-08-11 DIAGNOSIS — M1990 Unspecified osteoarthritis, unspecified site: Secondary | ICD-10-CM | POA: Diagnosis not present

## 2012-10-19 DIAGNOSIS — E119 Type 2 diabetes mellitus without complications: Secondary | ICD-10-CM | POA: Diagnosis not present

## 2012-10-19 DIAGNOSIS — E1149 Type 2 diabetes mellitus with other diabetic neurological complication: Secondary | ICD-10-CM | POA: Diagnosis not present

## 2012-11-03 DIAGNOSIS — I10 Essential (primary) hypertension: Secondary | ICD-10-CM | POA: Diagnosis not present

## 2012-11-03 DIAGNOSIS — Z23 Encounter for immunization: Secondary | ICD-10-CM | POA: Diagnosis not present

## 2012-11-03 DIAGNOSIS — E109 Type 1 diabetes mellitus without complications: Secondary | ICD-10-CM | POA: Diagnosis not present

## 2012-11-03 DIAGNOSIS — I259 Chronic ischemic heart disease, unspecified: Secondary | ICD-10-CM | POA: Diagnosis not present

## 2012-11-03 DIAGNOSIS — J449 Chronic obstructive pulmonary disease, unspecified: Secondary | ICD-10-CM | POA: Diagnosis not present

## 2013-01-04 DIAGNOSIS — E119 Type 2 diabetes mellitus without complications: Secondary | ICD-10-CM | POA: Diagnosis not present

## 2013-01-04 DIAGNOSIS — E1149 Type 2 diabetes mellitus with other diabetic neurological complication: Secondary | ICD-10-CM | POA: Diagnosis not present

## 2013-02-02 DIAGNOSIS — F329 Major depressive disorder, single episode, unspecified: Secondary | ICD-10-CM | POA: Diagnosis not present

## 2013-02-02 DIAGNOSIS — J449 Chronic obstructive pulmonary disease, unspecified: Secondary | ICD-10-CM | POA: Diagnosis not present

## 2013-03-22 DIAGNOSIS — E119 Type 2 diabetes mellitus without complications: Secondary | ICD-10-CM | POA: Diagnosis not present

## 2013-03-22 DIAGNOSIS — E1149 Type 2 diabetes mellitus with other diabetic neurological complication: Secondary | ICD-10-CM | POA: Diagnosis not present

## 2013-05-04 DIAGNOSIS — J4489 Other specified chronic obstructive pulmonary disease: Secondary | ICD-10-CM | POA: Diagnosis not present

## 2013-05-04 DIAGNOSIS — E109 Type 1 diabetes mellitus without complications: Secondary | ICD-10-CM | POA: Diagnosis not present

## 2013-05-04 DIAGNOSIS — F329 Major depressive disorder, single episode, unspecified: Secondary | ICD-10-CM | POA: Diagnosis not present

## 2013-05-04 DIAGNOSIS — J449 Chronic obstructive pulmonary disease, unspecified: Secondary | ICD-10-CM | POA: Diagnosis not present

## 2013-05-04 DIAGNOSIS — M1990 Unspecified osteoarthritis, unspecified site: Secondary | ICD-10-CM | POA: Diagnosis not present

## 2013-05-04 DIAGNOSIS — N19 Unspecified kidney failure: Secondary | ICD-10-CM | POA: Diagnosis not present

## 2013-05-04 DIAGNOSIS — F411 Generalized anxiety disorder: Secondary | ICD-10-CM | POA: Diagnosis not present

## 2013-05-04 DIAGNOSIS — I119 Hypertensive heart disease without heart failure: Secondary | ICD-10-CM | POA: Diagnosis not present

## 2013-06-01 DIAGNOSIS — H2510 Age-related nuclear cataract, unspecified eye: Secondary | ICD-10-CM | POA: Diagnosis not present

## 2013-06-01 DIAGNOSIS — H52229 Regular astigmatism, unspecified eye: Secondary | ICD-10-CM | POA: Diagnosis not present

## 2013-06-01 DIAGNOSIS — H5200 Hypermetropia, unspecified eye: Secondary | ICD-10-CM | POA: Diagnosis not present

## 2013-06-01 DIAGNOSIS — H43399 Other vitreous opacities, unspecified eye: Secondary | ICD-10-CM | POA: Diagnosis not present

## 2013-06-08 DIAGNOSIS — E1149 Type 2 diabetes mellitus with other diabetic neurological complication: Secondary | ICD-10-CM | POA: Diagnosis not present

## 2013-06-08 DIAGNOSIS — E119 Type 2 diabetes mellitus without complications: Secondary | ICD-10-CM | POA: Diagnosis not present

## 2013-08-03 DIAGNOSIS — E109 Type 1 diabetes mellitus without complications: Secondary | ICD-10-CM | POA: Diagnosis not present

## 2013-08-03 DIAGNOSIS — I119 Hypertensive heart disease without heart failure: Secondary | ICD-10-CM | POA: Diagnosis not present

## 2013-08-03 DIAGNOSIS — J449 Chronic obstructive pulmonary disease, unspecified: Secondary | ICD-10-CM | POA: Diagnosis not present

## 2013-08-17 DIAGNOSIS — E119 Type 2 diabetes mellitus without complications: Secondary | ICD-10-CM | POA: Diagnosis not present

## 2013-08-17 DIAGNOSIS — E1149 Type 2 diabetes mellitus with other diabetic neurological complication: Secondary | ICD-10-CM | POA: Diagnosis not present

## 2013-11-02 DIAGNOSIS — F411 Generalized anxiety disorder: Secondary | ICD-10-CM | POA: Diagnosis not present

## 2013-11-02 DIAGNOSIS — E109 Type 1 diabetes mellitus without complications: Secondary | ICD-10-CM | POA: Diagnosis not present

## 2013-11-02 DIAGNOSIS — F329 Major depressive disorder, single episode, unspecified: Secondary | ICD-10-CM | POA: Diagnosis not present

## 2013-11-02 DIAGNOSIS — I119 Hypertensive heart disease without heart failure: Secondary | ICD-10-CM | POA: Diagnosis not present

## 2013-11-02 DIAGNOSIS — L659 Nonscarring hair loss, unspecified: Secondary | ICD-10-CM | POA: Diagnosis not present

## 2013-11-02 DIAGNOSIS — J449 Chronic obstructive pulmonary disease, unspecified: Secondary | ICD-10-CM | POA: Diagnosis not present

## 2013-11-02 DIAGNOSIS — I259 Chronic ischemic heart disease, unspecified: Secondary | ICD-10-CM | POA: Diagnosis not present

## 2014-01-24 DIAGNOSIS — E1149 Type 2 diabetes mellitus with other diabetic neurological complication: Secondary | ICD-10-CM | POA: Diagnosis not present

## 2014-01-24 DIAGNOSIS — E119 Type 2 diabetes mellitus without complications: Secondary | ICD-10-CM | POA: Diagnosis not present

## 2014-03-22 DIAGNOSIS — F411 Generalized anxiety disorder: Secondary | ICD-10-CM | POA: Diagnosis not present

## 2014-03-22 DIAGNOSIS — I259 Chronic ischemic heart disease, unspecified: Secondary | ICD-10-CM | POA: Diagnosis not present

## 2014-03-22 DIAGNOSIS — J449 Chronic obstructive pulmonary disease, unspecified: Secondary | ICD-10-CM | POA: Diagnosis not present

## 2014-03-22 DIAGNOSIS — E109 Type 1 diabetes mellitus without complications: Secondary | ICD-10-CM | POA: Diagnosis not present

## 2014-04-11 DIAGNOSIS — E119 Type 2 diabetes mellitus without complications: Secondary | ICD-10-CM | POA: Diagnosis not present

## 2014-04-11 DIAGNOSIS — E1149 Type 2 diabetes mellitus with other diabetic neurological complication: Secondary | ICD-10-CM | POA: Diagnosis not present

## 2014-06-01 DIAGNOSIS — H43399 Other vitreous opacities, unspecified eye: Secondary | ICD-10-CM | POA: Diagnosis not present

## 2014-06-01 DIAGNOSIS — H2510 Age-related nuclear cataract, unspecified eye: Secondary | ICD-10-CM | POA: Diagnosis not present

## 2014-06-01 DIAGNOSIS — E119 Type 2 diabetes mellitus without complications: Secondary | ICD-10-CM | POA: Diagnosis not present

## 2014-06-01 DIAGNOSIS — H524 Presbyopia: Secondary | ICD-10-CM | POA: Diagnosis not present

## 2014-07-11 DIAGNOSIS — E119 Type 2 diabetes mellitus without complications: Secondary | ICD-10-CM | POA: Diagnosis not present

## 2014-07-11 DIAGNOSIS — E1149 Type 2 diabetes mellitus with other diabetic neurological complication: Secondary | ICD-10-CM | POA: Diagnosis not present

## 2014-09-20 DIAGNOSIS — I119 Hypertensive heart disease without heart failure: Secondary | ICD-10-CM | POA: Diagnosis not present

## 2014-09-20 DIAGNOSIS — Z23 Encounter for immunization: Secondary | ICD-10-CM | POA: Diagnosis not present

## 2014-09-20 DIAGNOSIS — F419 Anxiety disorder, unspecified: Secondary | ICD-10-CM | POA: Diagnosis not present

## 2014-09-20 DIAGNOSIS — J449 Chronic obstructive pulmonary disease, unspecified: Secondary | ICD-10-CM | POA: Diagnosis not present

## 2014-09-20 DIAGNOSIS — E1165 Type 2 diabetes mellitus with hyperglycemia: Secondary | ICD-10-CM | POA: Diagnosis not present

## 2014-09-26 DIAGNOSIS — E1140 Type 2 diabetes mellitus with diabetic neuropathy, unspecified: Secondary | ICD-10-CM | POA: Diagnosis not present

## 2014-09-26 DIAGNOSIS — E1151 Type 2 diabetes mellitus with diabetic peripheral angiopathy without gangrene: Secondary | ICD-10-CM | POA: Diagnosis not present

## 2014-12-12 DIAGNOSIS — E1151 Type 2 diabetes mellitus with diabetic peripheral angiopathy without gangrene: Secondary | ICD-10-CM | POA: Diagnosis not present

## 2014-12-12 DIAGNOSIS — E1140 Type 2 diabetes mellitus with diabetic neuropathy, unspecified: Secondary | ICD-10-CM | POA: Diagnosis not present

## 2014-12-20 DIAGNOSIS — E1165 Type 2 diabetes mellitus with hyperglycemia: Secondary | ICD-10-CM | POA: Diagnosis not present

## 2014-12-20 DIAGNOSIS — F419 Anxiety disorder, unspecified: Secondary | ICD-10-CM | POA: Diagnosis not present

## 2014-12-20 DIAGNOSIS — I119 Hypertensive heart disease without heart failure: Secondary | ICD-10-CM | POA: Diagnosis not present

## 2014-12-20 DIAGNOSIS — J449 Chronic obstructive pulmonary disease, unspecified: Secondary | ICD-10-CM | POA: Diagnosis not present

## 2015-02-20 DIAGNOSIS — E1151 Type 2 diabetes mellitus with diabetic peripheral angiopathy without gangrene: Secondary | ICD-10-CM | POA: Diagnosis not present

## 2015-02-20 DIAGNOSIS — E1140 Type 2 diabetes mellitus with diabetic neuropathy, unspecified: Secondary | ICD-10-CM | POA: Diagnosis not present

## 2015-03-21 DIAGNOSIS — E1165 Type 2 diabetes mellitus with hyperglycemia: Secondary | ICD-10-CM | POA: Diagnosis not present

## 2015-03-21 DIAGNOSIS — J449 Chronic obstructive pulmonary disease, unspecified: Secondary | ICD-10-CM | POA: Diagnosis not present

## 2015-03-21 DIAGNOSIS — M1990 Unspecified osteoarthritis, unspecified site: Secondary | ICD-10-CM | POA: Diagnosis not present

## 2015-06-20 DIAGNOSIS — F419 Anxiety disorder, unspecified: Secondary | ICD-10-CM | POA: Diagnosis not present

## 2015-06-20 DIAGNOSIS — J449 Chronic obstructive pulmonary disease, unspecified: Secondary | ICD-10-CM | POA: Diagnosis not present

## 2015-06-20 DIAGNOSIS — I119 Hypertensive heart disease without heart failure: Secondary | ICD-10-CM | POA: Diagnosis not present

## 2015-06-20 DIAGNOSIS — E1165 Type 2 diabetes mellitus with hyperglycemia: Secondary | ICD-10-CM | POA: Diagnosis not present

## 2015-09-19 DIAGNOSIS — Z23 Encounter for immunization: Secondary | ICD-10-CM | POA: Diagnosis not present

## 2015-09-19 DIAGNOSIS — F419 Anxiety disorder, unspecified: Secondary | ICD-10-CM | POA: Diagnosis not present

## 2015-09-19 DIAGNOSIS — I119 Hypertensive heart disease without heart failure: Secondary | ICD-10-CM | POA: Diagnosis not present

## 2015-09-19 DIAGNOSIS — J449 Chronic obstructive pulmonary disease, unspecified: Secondary | ICD-10-CM | POA: Diagnosis not present

## 2015-09-19 DIAGNOSIS — E1165 Type 2 diabetes mellitus with hyperglycemia: Secondary | ICD-10-CM | POA: Diagnosis not present

## 2015-12-19 DIAGNOSIS — E1165 Type 2 diabetes mellitus with hyperglycemia: Secondary | ICD-10-CM | POA: Diagnosis not present

## 2015-12-19 DIAGNOSIS — F419 Anxiety disorder, unspecified: Secondary | ICD-10-CM | POA: Diagnosis not present

## 2015-12-19 DIAGNOSIS — J449 Chronic obstructive pulmonary disease, unspecified: Secondary | ICD-10-CM | POA: Diagnosis not present

## 2015-12-19 DIAGNOSIS — I119 Hypertensive heart disease without heart failure: Secondary | ICD-10-CM | POA: Diagnosis not present

## 2016-03-18 DIAGNOSIS — I2510 Atherosclerotic heart disease of native coronary artery without angina pectoris: Secondary | ICD-10-CM | POA: Diagnosis not present

## 2016-03-18 DIAGNOSIS — I10 Essential (primary) hypertension: Secondary | ICD-10-CM | POA: Diagnosis not present

## 2016-03-18 DIAGNOSIS — J449 Chronic obstructive pulmonary disease, unspecified: Secondary | ICD-10-CM | POA: Diagnosis not present

## 2016-03-18 DIAGNOSIS — E119 Type 2 diabetes mellitus without complications: Secondary | ICD-10-CM | POA: Diagnosis not present

## 2016-06-11 ENCOUNTER — Emergency Department (HOSPITAL_COMMUNITY): Payer: Medicare Other

## 2016-06-11 ENCOUNTER — Encounter (HOSPITAL_COMMUNITY): Payer: Self-pay | Admitting: Emergency Medicine

## 2016-06-11 ENCOUNTER — Observation Stay (HOSPITAL_COMMUNITY)
Admission: EM | Admit: 2016-06-11 | Discharge: 2016-06-12 | Disposition: A | Discharge status: Home / Self Care | Payer: Medicare Other | Attending: Pulmonary Disease | Admitting: Pulmonary Disease

## 2016-06-11 ENCOUNTER — Other Ambulatory Visit: Payer: Self-pay

## 2016-06-11 DIAGNOSIS — E119 Type 2 diabetes mellitus without complications: Secondary | ICD-10-CM | POA: Diagnosis not present

## 2016-06-11 DIAGNOSIS — I10 Essential (primary) hypertension: Secondary | ICD-10-CM | POA: Diagnosis not present

## 2016-06-11 DIAGNOSIS — Z79899 Other long term (current) drug therapy: Secondary | ICD-10-CM | POA: Diagnosis not present

## 2016-06-11 DIAGNOSIS — Z7982 Long term (current) use of aspirin: Secondary | ICD-10-CM | POA: Insufficient documentation

## 2016-06-11 DIAGNOSIS — K439 Ventral hernia without obstruction or gangrene: Principal | ICD-10-CM | POA: Insufficient documentation

## 2016-06-11 DIAGNOSIS — R109 Unspecified abdominal pain: Secondary | ICD-10-CM | POA: Diagnosis present

## 2016-06-11 DIAGNOSIS — R079 Chest pain, unspecified: Secondary | ICD-10-CM | POA: Diagnosis present

## 2016-06-11 DIAGNOSIS — R0789 Other chest pain: Secondary | ICD-10-CM | POA: Insufficient documentation

## 2016-06-11 DIAGNOSIS — K449 Diaphragmatic hernia without obstruction or gangrene: Secondary | ICD-10-CM | POA: Diagnosis not present

## 2016-06-11 HISTORY — DX: Chest pain, unspecified: R07.9

## 2016-06-11 LAB — I-STAT TROPONIN, ED: Troponin i, poc: 0 ng/mL (ref 0.00–0.08)

## 2016-06-11 LAB — HEPATIC FUNCTION PANEL
ALK PHOS: 50 U/L (ref 38–126)
ALT: 21 U/L (ref 14–54)
AST: 22 U/L (ref 15–41)
Albumin: 4.1 g/dL (ref 3.5–5.0)
BILIRUBIN DIRECT: 0.1 mg/dL (ref 0.1–0.5)
BILIRUBIN INDIRECT: 0.3 mg/dL (ref 0.3–0.9)
Total Bilirubin: 0.4 mg/dL (ref 0.3–1.2)
Total Protein: 7.4 g/dL (ref 6.5–8.1)

## 2016-06-11 LAB — BASIC METABOLIC PANEL
Anion gap: 7 (ref 5–15)
BUN: 19 mg/dL (ref 6–20)
CALCIUM: 8.6 mg/dL — AB (ref 8.9–10.3)
CHLORIDE: 103 mmol/L (ref 101–111)
CO2: 23 mmol/L (ref 22–32)
Creatinine, Ser: 0.97 mg/dL (ref 0.44–1.00)
GFR calc Af Amer: >60 mL/min (ref >60)
GFR calc non Af Amer: 56 mL/min — ABNORMAL LOW (ref >60)
Glucose, Bld: 237 mg/dL — ABNORMAL HIGH (ref 65–99)
Potassium: 4.8 mmol/L (ref 3.5–5.1)
SODIUM: 133 mmol/L — AB (ref 135–145)

## 2016-06-11 LAB — CBC
HEMATOCRIT: 38.4 % (ref 36.0–46.0)
HEMOGLOBIN: 13.1 g/dL (ref 12.0–15.0)
MCH: 31.1 pg (ref 26.0–34.0)
MCHC: 34.1 g/dL (ref 30.0–36.0)
MCV: 91.2 fL (ref 78.0–100.0)
Platelets: 268 10*3/uL (ref 150–400)
RBC: 4.21 MIL/uL (ref 3.87–5.11)
RDW: 12.5 % (ref 11.5–15.5)
WBC: 13.5 10*3/uL — ABNORMAL HIGH (ref 4.0–10.5)

## 2016-06-11 LAB — LIPASE, BLOOD: Lipase: 39 U/L (ref 11–51)

## 2016-06-11 LAB — TROPONIN I: Troponin I: <0.03 ng/mL (ref <0.03)

## 2016-06-11 MED ORDER — INSULIN ASPART 100 UNIT/ML ~~LOC~~ SOLN: 0.0000 [IU] | Freq: Three times a day (TID) | SUBCUTANEOUS | Status: DC

## 2016-06-11 MED ORDER — ASPIRIN 81 MG PO CHEW
324.0000 mg | CHEWABLE_TABLET | Freq: Once | ORAL | Status: AC
  Administered 2016-06-11: 324 mg via ORAL
  Filled 2016-06-11: qty 4

## 2016-06-11 MED ORDER — ACETAMINOPHEN 325 MG PO TABS
650.0000 mg | ORAL_TABLET | ORAL | Status: DC | PRN
  Administered 2016-06-12: 650 mg via ORAL
  Filled 2016-06-11: qty 2

## 2016-06-11 MED ORDER — OMEGA-3-ACID ETHYL ESTERS 1 G PO CAPS
1.0000 g | ORAL_CAPSULE | Freq: Two times a day (BID) | ORAL | Status: DC
  Administered 2016-06-11 – 2016-06-12 (×2): 1 g via ORAL
  Filled 2016-06-11 (×2): qty 1

## 2016-06-11 MED ORDER — ONDANSETRON HCL 4 MG/2ML IJ SOLN: 4.0000 mg | Freq: Four times a day (QID) | INTRAMUSCULAR | Status: DC | PRN

## 2016-06-11 MED ORDER — GI COCKTAIL ~~LOC~~
30.0000 mL | Freq: Four times a day (QID) | ORAL | Status: DC | PRN
Start: 2016-06-11 — End: 2016-06-12

## 2016-06-11 MED ORDER — LOSARTAN POTASSIUM 50 MG PO TABS
25.0000 mg | ORAL_TABLET | Freq: Every day | ORAL | Status: DC
  Administered 2016-06-12: 25 mg via ORAL
  Filled 2016-06-11: qty 1

## 2016-06-11 MED ORDER — SODIUM CHLORIDE 0.9 % IV BOLUS (SEPSIS)
250.0000 mL | Freq: Once | INTRAVENOUS | Status: AC
  Administered 2016-06-11: 250 mL via INTRAVENOUS

## 2016-06-11 MED ORDER — MORPHINE SULFATE (PF) 2 MG/ML IV SOLN: 2.0000 mg | INTRAVENOUS | Status: DC | PRN

## 2016-06-11 MED ORDER — ASPIRIN EC 81 MG PO TBEC
81.0000 mg | DELAYED_RELEASE_TABLET | Freq: Every day | ORAL | Status: DC
  Administered 2016-06-12: 81 mg via ORAL
  Filled 2016-06-11: qty 1

## 2016-06-11 MED ORDER — ONDANSETRON HCL 4 MG/2ML IJ SOLN
4.0000 mg | Freq: Once | INTRAMUSCULAR | Status: AC
  Administered 2016-06-11: 4 mg via INTRAVENOUS
  Filled 2016-06-11: qty 2

## 2016-06-11 MED ORDER — SODIUM CHLORIDE 0.9 % IV SOLN
INTRAVENOUS | Status: DC
  Administered 2016-06-11: 17:00:00 via INTRAVENOUS

## 2016-06-11 MED ORDER — ENOXAPARIN SODIUM 40 MG/0.4ML ~~LOC~~ SOLN
40.0000 mg | SUBCUTANEOUS | Status: DC
  Filled 2016-06-11: qty 0.4

## 2016-06-11 NOTE — H&P (Signed)
[facility] H&P   Patient Demographics:    [name] [name], is a 75 y.o. female  MRN: [id number]   DOB - 1941/06/23  Admit Date - 06/11/2016  Outpatient Primary MD for the patient is No primary care provider on file.  Referring MD/NP/PA: Dr Jodelle GrossZakowski  Patient coming from: home   Chief Complaint  Patient presents with  . Chest Pain  . Abdominal Pain      HPI:    [name] MedicusJoyce Supak  is a 75 y.o. female, Referred from Dr. Juanetta GoslingHawkins office for concern for left-sided chest pain which started around 8:00 this morning. Patient has long-standing history of left-sided ventral hernia after she had left nephrectomy in 1970s. CT abdomen and pelvis done in the ED showed no acute abnormality but showed left large ventral hernia with small bowel and sigmoid colon without evidence for bowel obstruction. Patient denies shortness of breath. No fever or chills. No nausea vomiting or diarrhea. No dysuria urgency frequency of urination.    Review of systems:    In addition to the HPI above,   No Headache, No changes with Vision or hearing, No problems swallowing food or Liquids,   No Blood in stool or Urine, No dysuria, No new skin rashes or bruises, No new joints pains-aches,  No new weakness, tingling, numbness in any extremity, No recent weight gain or loss, No polyuria, polydypsia or polyphagia, No significant Mental Stressors.  A full 10 point Review of Systems was done, except as stated above, all other Review of Systems were negative.   With Past History of the following :    Past Medical History  Diagnosis Date  . Hypertension   . Diabetes mellitus   . Renal disorder       Past Surgical History  Procedure Laterality Date  . Nephrectomy        Social History:     Social History  Substance Use Topics  . Smoking status: Never Smoker   . Smokeless tobacco: Not on file  . Alcohol Use: No        Family History :      Home Medications:   Prior to Admission medications   Medication Sig Start Date End Date Taking? Authorizing Provider  aspirin 81 MG tablet Take 81 mg by mouth daily.   Yes Historical Provider, MD  fish oil-omega-3 fatty acids 1000 MG capsule Take 1 g by mouth 2 (two) times daily.   Yes Historical Provider, MD  JANUMET 50-1000 MG tablet Take 1 tablet by mouth 2 (two) times daily. 05/13/16  Yes Historical Provider, MD  losartan (COZAAR) 50 MG tablet Take 25 mg by mouth daily.   Yes Historical Provider, MD  Multiple Vitamin (MULITIVITAMIN WITH MINERALS) TABS Take 1 tablet by mouth daily.   Yes Historical Provider, MD  OXYGEN-HELIUM IN Inhale into the lungs at bedtime as needed.    Historical Provider, MD     Allergies:     Allergies  Allergen Reactions  . Codeine   . Contrast Media [  Iodinated Diagnostic Agents]   . Morphine And Related   . Other     Pt has multiple allergies per pt but unsure of all.   . Penicillins     Has patient had a PCN reaction causing immediate rash, facial/tongue/throat swelling, SOB or lightheadedness with hypotension: Yes Has patient had a PCN reaction causing severe rash involving mucus membranes or skin necrosis: No Has patient had a PCN reaction that required hospitalization No Has patient had a PCN reaction occurring within the last 10 years: No If all of the above answers are "NO", then may proceed with Cephalosporin use.      Physical Exam:   Vitals  Blood pressure 147/85, pulse 77, temperature 99.3 F (37.4 C), temperature source Oral, resp. rate 23, height 5' (1.524 m), weight 81.647 kg (180 lb), SpO2 99 %.   1. General Caucasian female lying in bed in NAD, cooperative with exam  2. Normal affect and insight, Not Suicidal or Homicidal, Awake Alert, Oriented X 3.  3. No F.N deficits, ALL C.Nerves Intact, Strength 5/5 all 4 extremities, Sensation intact all 4 extremities, Plantars down going.  4. Ears and Eyes appear  Normal, Conjunctivae clear, PERRLA. Moist Oral Mucosa.  5. Supple Neck, No JVD, No cervical lymphadenopathy appriciated, No Carotid Bruits.  6. Symmetrical Chest wall movement, Good air movement bilaterally, CTAB.  7. RRR, No Gallops, Rubs or Murmurs, No Parasternal Heave.  8. Positive Bowel Sounds, Abdomen Soft, No tenderness,very large left sided ventral hernia, No organomegaly appriciated,No rebound -guarding or rigidity.  9.  No Cyanosis, Normal Skin Turgor, No Skin Rash or Bruise.  10. Good muscle tone,  joints appear normal , no effusions, Normal ROM.      Data Review:    CBC  Recent Labs Lab 06/11/16 1555  WBC 13.5*  HGB 13.1  HCT 38.4  PLT 268  MCV 91.2  MCH 31.1  MCHC 34.1  RDW 12.5   ------------------------------------------------------------------------------------------------------------------  Chemistries   Recent Labs Lab 06/11/16 1555  NA 133*  K 4.8  CL 103  CO2 23  GLUCOSE 237*  BUN 19  CREATININE 0.97  CALCIUM 8.6*  AST 22  ALT 21  ALKPHOS 50  BILITOT 0.4   -------------------------------------------------------------------------------------------------------------------  Cardiac Enzymes  Recent Labs Lab 06/11/16 1555  TROPONINI <0.03   ------------------------------------------------------------------------------------------------------------------ No results found for: BNP   ---------------------------------------------------------------------------------------------------------------  Urinalysis    Component Value Date/Time   COLORURINE YELLOW 05/30/2010 1903   APPEARANCEUR CLEAR 05/30/2010 1903   LABSPEC >1.030* 05/30/2010 1903   PHURINE 6.0 05/30/2010 1903   GLUCOSEU NEGATIVE 05/30/2010 1903   HGBUR NEGATIVE 05/30/2010 1903   BILIRUBINUR NEGATIVE 05/30/2010 1903   KETONESUR TRACE* 05/30/2010 1903   PROTEINUR NEGATIVE 05/30/2010 1903   UROBILINOGEN 0.2 05/30/2010 1903   NITRITE NEGATIVE 05/30/2010 1903    LEUKOCYTESUR  05/30/2010 1903    NEGATIVE MICROSCOPIC NOT DONE ON URINES WITH NEGATIVE PROTEIN, BLOOD, LEUKOCYTES, NITRITE, OR GLUCOSE <1000 mg/dL.    ----------------------------------------------------------------------------------------------------------------   Imaging Results:    Ct Abdomen Pelvis Wo Contrast  06/11/2016  CLINICAL DATA:  Abdominal pain ventral hernia.  Abdominal swelling. EXAM: CT ABDOMEN AND PELVIS WITHOUT CONTRAST TECHNIQUE: Multidetector CT imaging of the abdomen and pelvis was performed following the standard protocol without IV contrast. COMPARISON:  12/11/2008. FINDINGS: Lower chest:  Subsegmental atelectasis. Hepatobiliary: No focal abnormality in the liver on this study without intravenous contrast. No evidence of hepatomegaly. There is no evidence for gallstones, gallbladder wall thickening, or pericholecystic fluid. No intrahepatic  or extrahepatic biliary dilation. Pancreas: No focal mass lesion. No dilatation of the main duct. No intraparenchymal cyst. No peripancreatic edema. Spleen: No splenomegaly. No focal mass lesion. Adrenals/Urinary Tract: No adrenal nodule or mass. No hydronephrosis or gross lesion seen in the right kidney on this study without IV contrast material. No right hydroureter left kidney surgically absent. The urinary bladder appears normal for the degree of distention. Stomach/Bowel: Stomach is nondistended. No gastric wall thickening. No evidence of outlet obstruction. Duodenum is normally positioned as is the ligament of Treitz. No small bowel wall thickening. No small bowel dilatation. The terminal ileum is normal. The appendix is normal. Diverticuli are seen scattered along the entire length of the colon without CT findings of diverticulitis. Vascular/Lymphatic: There is abdominal aortic atherosclerosis without aneurysm. There is no gastrohepatic or hepatoduodenal ligament lymphadenopathy. No intraperitoneal or retroperitoneal lymphadenopathy. No  pelvic sidewall lymphadenopathy. Reproductive: Uterus unremarkable.  There is no adnexal mass. Other: No intraperitoneal free fluid. Musculoskeletal: Complex left abdominal wall hernia contains loops of small bowel and sigmoid colon there is no evidence for bowel obstruction related to the hernia and although there may be a trace amount of edema in the hernia sac, there is no gross free fluid in the hernia. No evidence for wall thickening in the small bowel or colon involved in the hernia. IMPRESSION: 1. Large, complex, left-sided the anterior abdominal wall hernia contains small bowel and sigmoid colon without evidence for bowel obstruction. There is no bowel wall thickening in the herniated loops in no gross fluid in the hernia sac. 2. Status post left nephrectomy. Imaging findings of potential clinical significance: Aortic Atherosclerosis (ICD10-170.0) Electronically Signed   By: Kennith CenterEric  Mansell M.D.   On: 06/11/2016 20:53   Dg Chest 2 View  06/11/2016  CLINICAL DATA:  Intermittent right flank pain and central chest pain since yesterday. EXAM: CHEST  2 VIEW COMPARISON:  May 16, 2010 FINDINGS: The heart size and mediastinal contours are within normal limits. There is no focal infiltrate, pulmonary edema, or pleural effusion. Minimal left lung base atelectasis is identified. The visualized skeletal structures are stable. IMPRESSION: No active cardiopulmonary disease. Electronically Signed   By: Sherian ReinWei-Chen  Lin M.D.   On: 06/11/2016 17:17   EKG- Sinus bradycardia    Assessment & Plan:    Active Problems:   Chest pain     1. Chest pain- will admit under observation, will obtain serial cardiac enzymes. Morphine prn for pain. 2. Diabetes mellitus- will start sliding scale insulin. Hold oral hypoglycemic agents. 3. Large ventral hernia- chronic, no small bowel obstruction. Morphine prn for pain.   DVT Prophylaxis-   Lovenox   AM Labs Ordered, also please review Full Orders  Family Communication:  Admission, patients condition and plan of care including tests being ordered have been discussed with the patient and her daughter at bedside* who indicate understanding and agree with the plan and Code Status.  Code Status:  Full code  Admission status: Observation  Time spent in minutes : 60 minutes   Shaman Muscarella S M.D on 06/11/2016 at 10:08 PM  Between 7am to 7pm - Pager - [phone]. After 7pm go to [email or web address] - password [email or web address]  [facility] - Office  [phone]

## 2016-06-11 NOTE — ED Provider Notes (Signed)
CSN: [id number]     Arrival date & time 06/11/16  1505 History   First MD Initiated Contact with Patient 06/11/16 1546     Chief Complaint  Patient presents with  . Chest Pain  . Abdominal Pain     (Consider location/radiation/quality/duration/timing/severity/associated sxs/prior Treatment) The history is provided by the patient.  75 year old female referred in by Dr. Juanetta GoslingHawkins office for concern for left chest pain described as pressure that started about 8 or 8:30 this morning. Patient's had a long-standing large left-sided ventral hernia going back into the 1970s. Patient has cardiac risk factors of hypertension diabetes. Patient states the pressure is something new and has been persistent. Patient states that is 10 out of 10. Patient is difficult to get specifics from the history. Friend was helpful.  Past Medical History  Diagnosis Date  . Hypertension   . Diabetes mellitus   . Renal disorder    Past Surgical History  Procedure Laterality Date  . Nephrectomy     No family history on file. Social History  Substance Use Topics  . Smoking status: Never Smoker   . Smokeless tobacco: None  . Alcohol Use: No   OB History    No data available     Review of Systems  Constitutional: Negative for fever.  HENT: Negative for congestion.   Eyes: Negative for visual disturbance.  Respiratory: Negative for shortness of breath.   Cardiovascular: Positive for chest pain.  Gastrointestinal: Positive for nausea, abdominal pain and abdominal distention. Negative for vomiting.  Genitourinary: Negative for dysuria.  Musculoskeletal: Negative for back pain.  Skin: Negative for rash.  Neurological: Negative for headaches.  Hematological: Does not bruise/bleed easily.  Psychiatric/Behavioral: Negative for confusion.      Allergies  Codeine; Contrast media; Morphine and related; Other; and Penicillins  Home Medications   Prior to Admission medications   Medication Sig Start Date  End Date Taking? Authorizing Provider  aspirin 81 MG tablet Take 81 mg by mouth daily.   Yes Historical Provider, MD  fish oil-omega-3 fatty acids 1000 MG capsule Take 1 g by mouth 2 (two) times daily.   Yes Historical Provider, MD  JANUMET 50-1000 MG tablet Take 1 tablet by mouth 2 (two) times daily. 05/13/16  Yes Historical Provider, MD  losartan (COZAAR) 50 MG tablet Take 25 mg by mouth daily.   Yes Historical Provider, MD  Multiple Vitamin (MULITIVITAMIN WITH MINERALS) TABS Take 1 tablet by mouth daily.   Yes Historical Provider, MD  OXYGEN-HELIUM IN Inhale into the lungs at bedtime as needed.    Historical Provider, MD   BP 147/85 mmHg  Pulse 77  Temp(Src) 99.3 F (37.4 C) (Oral)  Resp 23  Ht 5' (1.524 m)  Wt 81.647 kg  BMI 35.15 kg/m2  SpO2 99% Physical Exam  Constitutional: She is oriented to person, place, and time. She appears well-developed and well-nourished. No distress.  HENT:  Head: Normocephalic and atraumatic.  Mouth/Throat: Oropharynx is clear and moist.  Eyes: Conjunctivae and EOM are normal. Pupils are equal, round, and reactive to light.  Neck: Normal range of motion. Neck supple.  Cardiovascular: Normal rate, regular rhythm and normal heart sounds.   No murmur heard. Pulmonary/Chest: Effort normal and breath sounds normal. No respiratory distress.  Abdominal: Soft. Bowel sounds are normal. She exhibits mass.  Very large left-sided ventral hernia no erythema mild discomfort to palpation. Too large to be reducible.  Musculoskeletal: Normal range of motion.  Neurological: She is alert and oriented to  person, place, and time. No cranial nerve deficit. She exhibits normal muscle tone. Coordination normal.  Skin: Skin is warm.  Nursing note and vitals reviewed.   ED Course  Procedures (including critical care time) Labs Review Labs Reviewed  BASIC METABOLIC PANEL - Abnormal; Notable for the following:    Sodium 133 (*)    Glucose, Bld 237 (*)    Calcium 8.6 (*)     GFR calc non Af Amer 56 (*)    All other components within normal limits  CBC - Abnormal; Notable for the following:    WBC 13.5 (*)    All other components within normal limits  TROPONIN I  HEPATIC FUNCTION PANEL  LIPASE, BLOOD  I-STAT TROPOININ, ED    Imaging Review Ct Abdomen Pelvis Wo Contrast  06/11/2016  CLINICAL DATA:  Abdominal pain ventral hernia.  Abdominal swelling. EXAM: CT ABDOMEN AND PELVIS WITHOUT CONTRAST TECHNIQUE: Multidetector CT imaging of the abdomen and pelvis was performed following the standard protocol without IV contrast. COMPARISON:  12/11/2008. FINDINGS: Lower chest:  Subsegmental atelectasis. Hepatobiliary: No focal abnormality in the liver on this study without intravenous contrast. No evidence of hepatomegaly. There is no evidence for gallstones, gallbladder wall thickening, or pericholecystic fluid. No intrahepatic or extrahepatic biliary dilation. Pancreas: No focal mass lesion. No dilatation of the main duct. No intraparenchymal cyst. No peripancreatic edema. Spleen: No splenomegaly. No focal mass lesion. Adrenals/Urinary Tract: No adrenal nodule or mass. No hydronephrosis or gross lesion seen in the right kidney on this study without IV contrast material. No right hydroureter left kidney surgically absent. The urinary bladder appears normal for the degree of distention. Stomach/Bowel: Stomach is nondistended. No gastric wall thickening. No evidence of outlet obstruction. Duodenum is normally positioned as is the ligament of Treitz. No small bowel wall thickening. No small bowel dilatation. The terminal ileum is normal. The appendix is normal. Diverticuli are seen scattered along the entire length of the colon without CT findings of diverticulitis. Vascular/Lymphatic: There is abdominal aortic atherosclerosis without aneurysm. There is no gastrohepatic or hepatoduodenal ligament lymphadenopathy. No intraperitoneal or retroperitoneal lymphadenopathy. No pelvic  sidewall lymphadenopathy. Reproductive: Uterus unremarkable.  There is no adnexal mass. Other: No intraperitoneal free fluid. Musculoskeletal: Complex left abdominal wall hernia contains loops of small bowel and sigmoid colon there is no evidence for bowel obstruction related to the hernia and although there may be a trace amount of edema in the hernia sac, there is no gross free fluid in the hernia. No evidence for wall thickening in the small bowel or colon involved in the hernia. IMPRESSION: 1. Large, complex, left-sided the anterior abdominal wall hernia contains small bowel and sigmoid colon without evidence for bowel obstruction. There is no bowel wall thickening in the herniated loops in no gross fluid in the hernia sac. 2. Status post left nephrectomy. Imaging findings of potential clinical significance: Aortic Atherosclerosis (ICD10-170.0) Electronically Signed   By: Kennith Center M.D.   On: 06/11/2016 20:53   Dg Chest 2 View  06/11/2016  CLINICAL DATA:  Intermittent right flank pain and central chest pain since yesterday. EXAM: CHEST  2 VIEW COMPARISON:  May 16, 2010 FINDINGS: The heart size and mediastinal contours are within normal limits. There is no focal infiltrate, pulmonary edema, or pleural effusion. Minimal left lung base atelectasis is identified. The visualized skeletal structures are stable. IMPRESSION: No active cardiopulmonary disease. Electronically Signed   By: Sherian Rein M.D.   On: 06/11/2016 17:17   I have personally  reviewed and evaluated these images and lab results as part of my medical decision-making.   EKG Interpretation   Date/Time:  Wednesday June 11 2016 15:25:40 EDT Ventricular Rate:  58 PR Interval:  166 QRS Duration: 92 QT Interval:  382 QTC Calculation: 374 R Axis:   73 Text Interpretation:  Sinus bradycardia with marked sinus arrhythmia Low  voltage QRS Borderline ECG Confirmed by Markeith Jue  MD, Aldea Avis ([id number]) on  06/11/2016 3:51:07 PM      MDM    Final diagnoses:  Chest pain, unspecified chest pain type  Ventral hernia without obstruction or gangrene    Patient with persistent left-sided chest pressure. 2 negative troponins unlikely to be an acute cardiac event. However patient has risk factors with diabetes and hypertension. Said discomfort in that area since 8 or 8:30 this morning. However it may be related to the large ventral hernia. Has no acute abnormalities based on CT scan.  Discussed with hospitalist they will admit for a cardiac rule out patient followed by Dr. Juanetta GoslingHawkins.    Vanetta MuldersScott Samanta Gal, MD 06/11/16 2117

## 2016-06-11 NOTE — ED Notes (Signed)
Pt sent over by Dr. Juanetta GoslingHawkins for evaluation of LT sided abdominal swelling and chest pain. States chest pain is right under rib cage. States that her abdomen began swelling last night. States she thinks the pressure is what is causing the CP. Pt also reports n/v.

## 2016-06-12 DIAGNOSIS — K439 Ventral hernia without obstruction or gangrene: Secondary | ICD-10-CM | POA: Diagnosis not present

## 2016-06-12 DIAGNOSIS — R079 Chest pain, unspecified: Secondary | ICD-10-CM | POA: Diagnosis not present

## 2016-06-12 DIAGNOSIS — R072 Precordial pain: Secondary | ICD-10-CM

## 2016-06-12 LAB — GLUCOSE, CAPILLARY
GLUCOSE-CAPILLARY: 157 mg/dL — AB (ref 65–99)
GLUCOSE-CAPILLARY: 147 mg/dL — AB (ref 65–99)
Glucose-Capillary: 193 mg/dL — ABNORMAL HIGH (ref 65–99)

## 2016-06-12 LAB — TROPONIN I: Troponin I: <0.03 ng/mL (ref <0.03)

## 2016-06-12 NOTE — Consult Note (Signed)
Primary Physician:  Juanetta GoslingHawkins   Primary Cardiologist:  2012:  Nishan  Pt is a 75 yo we are asked to see by Dr Juanetta GoslingHawkins for CP  HPI: Pt is a 75 yo with history of CP She was last seen in cardiology in 2012 for CP   Had a myoview in 2008 that showed lateral scar +/- soft tissue attenuation.  Myoview not done in 2012. The pt was admitted on 6/28 with L sided CP  Begain yesterday at 8 AM  CT abdomen showed ventral hernia      Tues felt OK  Wed  Woke up with abdominal and then chest pressure  Constant  Swollen back and side.  Tight.  Told to come to hspital  Pt has a long history of abdomnal pain  Has ventral hernia  Has a lot of swelling    Today feeling better  No CP  Breathing is OK    Father with MI at age 75  He was a smoker      Past Medical History  Diagnosis Date  . Hypertension   . Diabetes mellitus   . Renal disorder     Medications Prior to Admission  Medication Sig Dispense Refill  . aspirin 81 MG tablet Take 81 mg by mouth daily.    . fish oil-omega-3 fatty acids 1000 MG capsule Take 1 g by mouth 2 (two) times daily.    Marland Kitchen. JANUMET 50-1000 MG tablet Take 1 tablet by mouth 2 (two) times daily.    Marland Kitchen. losartan (COZAAR) 50 MG tablet Take 25 mg by mouth daily.    . Multiple Vitamin (MULITIVITAMIN WITH MINERALS) TABS Take 1 tablet by mouth daily.    Docia Barrier. OXYGEN-HELIUM IN Inhale into the lungs at bedtime as needed.       Marland Kitchen. aspirin EC  81 mg Oral Daily  . enoxaparin (LOVENOX) injection  40 mg Subcutaneous Q24H  . insulin aspart  0-9 Units Subcutaneous TID WC  . losartan  25 mg Oral Daily  . omega-3 acid ethyl esters  1 g Oral BID    Infusions: . sodium chloride 75 mL/hr at 06/11/16 1721    Allergies  Allergen Reactions  . Codeine   . Contrast Media [Iodinated Diagnostic Agents]   . Morphine And Related   . Other     Pt has multiple allergies per pt but unsure of all.   . Penicillins     Has patient had a PCN reaction causing immediate rash, facial/tongue/throat  swelling, SOB or lightheadedness with hypotension: Yes Has patient had a PCN reaction causing severe rash involving mucus membranes or skin necrosis: No Has patient had a PCN reaction that required hospitalization No Has patient had a PCN reaction occurring within the last 10 years: No If all of the above answers are "NO", then may proceed with Cephalosporin use.     Social History   Social History  . Marital Status: Widowed    Spouse Name: N/A  . Number of Children: N/A  . Years of Education: N/A   Occupational History  . Not on file.   Social History Main Topics  . Smoking status: Never Smoker   . Smokeless tobacco: Not on file  . Alcohol Use: No  . Drug Use: No  . Sexual Activity: Not on file   Other Topics Concern  . Not on file   Social History Narrative    History reviewed. No pertinent family history.  REVIEW OF SYSTEMS:  All  systems reviewed  Negative to the above problem except as noted above.    PHYSICAL EXAM: Filed Vitals:   06/12/16 0303 06/12/16 0540  BP: 121/75 117/46  Pulse: 76 85  Temp: 98.3 F (36.8 C) 97.9 F (36.6 C)  Resp: 20 20     Intake/Output Summary (Last 24 hours) at 06/12/16 0858 Last data filed at 06/12/16 0302  Gross per 24 hour  Intake      0 ml  Output   1075 ml  Net  -1075 ml    General:  Morbidly obese 75 yo in NAD   HEENT: normal Neck: supple. no JVD. Carotids 2+ bilat; no bruits. No lymphadenopathy or thryomegaly appreciated. Cor: PMI nondisplaced. Regular rate & rhythm. No rubs, gallops or murmurs. Lungs: clear Abdomen: Diffusely tender  Supple  No masses  No rebound  No hepatosplenomegaly. No bruits or masses. Good bowel sounds.  surg scar   Extremities: no cyanosis, clubbing, rash, edema Neuro: alert & oriented x 3, cranial nerves grossly intact. moves all 4 extremities w/o difficulty. Affect pleasant.  ECG:  SB 58 bpm with PACs  Nonspecific ST changes    Results for orders placed or performed during the hospital  encounter of 06/11/16 (from the past 24 hour(s))  Basic metabolic panel     Status: Abnormal   Collection Time: 06/11/16  3:55 PM  Result Value Ref Range   Sodium 133 (L) 135 - 145 mmol/L   Potassium 4.8 3.5 - 5.1 mmol/L   Chloride 103 101 - 111 mmol/L   CO2 23 22 - 32 mmol/L   Glucose, Bld 237 (H) 65 - 99 mg/dL   BUN 19 6 - 20 mg/dL   Creatinine, Ser 1.61 0.44 - 1.00 mg/dL   Calcium 8.6 (L) 8.9 - 10.3 mg/dL   GFR calc non Af Amer 56 (L) >60 mL/min   GFR calc Af Amer >60 >60 mL/min   Anion gap 7 5 - 15  CBC     Status: Abnormal   Collection Time: 06/11/16  3:55 PM  Result Value Ref Range   WBC 13.5 (H) 4.0 - 10.5 K/uL   RBC 4.21 3.87 - 5.11 MIL/uL   Hemoglobin 13.1 12.0 - 15.0 g/dL   HCT 09.6 04.5 - 40.9 %   MCV 91.2 78.0 - 100.0 fL   MCH 31.1 26.0 - 34.0 pg   MCHC 34.1 30.0 - 36.0 g/dL   RDW 81.1 91.4 - 78.2 %   Platelets 268 150 - 400 K/uL  Troponin I     Status: None   Collection Time: 06/11/16  3:55 PM  Result Value Ref Range   Troponin I <0.03 <0.03 ng/mL  Hepatic function panel     Status: None   Collection Time: 06/11/16  3:55 PM  Result Value Ref Range   Total Protein 7.4 6.5 - 8.1 g/dL   Albumin 4.1 3.5 - 5.0 g/dL   AST 22 15 - 41 U/L   ALT 21 14 - 54 U/L   Alkaline Phosphatase 50 38 - 126 U/L   Total Bilirubin 0.4 0.3 - 1.2 mg/dL   Bilirubin, Direct 0.1 0.1 - 0.5 mg/dL   Indirect Bilirubin 0.3 0.3 - 0.9 mg/dL  Lipase, blood     Status: None   Collection Time: 06/11/16  3:55 PM  Result Value Ref Range   Lipase 39 11 - 51 U/L  I-Stat Troponin, ED (not at [facility])     Status: None   Collection Time: 06/11/16  8:37 PM  Result Value Ref Range   Troponin i, poc 0.00 0.00 - 0.08 ng/mL   Comment 3          Troponin I (q 6hr x 3)     Status: None   Collection Time: 06/11/16 11:33 PM  Result Value Ref Range   Troponin I <0.03 <0.03 ng/mL  Troponin I (q 6hr x 3)     Status: None   Collection Time: 06/12/16  5:53 AM  Result Value Ref Range   Troponin I <0.03 <0.03  ng/mL  Glucose, capillary     Status: Abnormal   Collection Time: 06/12/16  7:27 AM  Result Value Ref Range   Glucose-Capillary 157 (H) 65 - 99 mg/dL   Comment 1 Notify RN    Comment 2 Document in Chart    Ct Abdomen Pelvis Wo Contrast  06/11/2016  CLINICAL DATA:  Abdominal pain ventral hernia.  Abdominal swelling. EXAM: CT ABDOMEN AND PELVIS WITHOUT CONTRAST TECHNIQUE: Multidetector CT imaging of the abdomen and pelvis was performed following the standard protocol without IV contrast. COMPARISON:  12/11/2008. FINDINGS: Lower chest:  Subsegmental atelectasis. Hepatobiliary: No focal abnormality in the liver on this study without intravenous contrast. No evidence of hepatomegaly. There is no evidence for gallstones, gallbladder wall thickening, or pericholecystic fluid. No intrahepatic or extrahepatic biliary dilation. Pancreas: No focal mass lesion. No dilatation of the main duct. No intraparenchymal cyst. No peripancreatic edema. Spleen: No splenomegaly. No focal mass lesion. Adrenals/Urinary Tract: No adrenal nodule or mass. No hydronephrosis or gross lesion seen in the right kidney on this study without IV contrast material. No right hydroureter left kidney surgically absent. The urinary bladder appears normal for the degree of distention. Stomach/Bowel: Stomach is nondistended. No gastric wall thickening. No evidence of outlet obstruction. Duodenum is normally positioned as is the ligament of Treitz. No small bowel wall thickening. No small bowel dilatation. The terminal ileum is normal. The appendix is normal. Diverticuli are seen scattered along the entire length of the colon without CT findings of diverticulitis. Vascular/Lymphatic: There is abdominal aortic atherosclerosis without aneurysm. There is no gastrohepatic or hepatoduodenal ligament lymphadenopathy. No intraperitoneal or retroperitoneal lymphadenopathy. No pelvic sidewall lymphadenopathy. Reproductive: Uterus unremarkable.  There is no  adnexal mass. Other: No intraperitoneal free fluid. Musculoskeletal: Complex left abdominal wall hernia contains loops of small bowel and sigmoid colon there is no evidence for bowel obstruction related to the hernia and although there may be a trace amount of edema in the hernia sac, there is no gross free fluid in the hernia. No evidence for wall thickening in the small bowel or colon involved in the hernia. IMPRESSION: 1. Large, complex, left-sided the anterior abdominal wall hernia contains small bowel and sigmoid colon without evidence for bowel obstruction. There is no bowel wall thickening in the herniated loops in no gross fluid in the hernia sac. 2. Status post left nephrectomy. Imaging findings of potential clinical significance: Aortic Atherosclerosis (ICD10-170.0) Electronically Signed   By: Kennith Center M.D.   On: 06/11/2016 20:53   Dg Chest 2 View  06/11/2016  CLINICAL DATA:  Intermittent right flank pain and central chest pain since yesterday. EXAM: CHEST  2 VIEW COMPARISON:  May 16, 2010 FINDINGS: The heart size and mediastinal contours are within normal limits. There is no focal infiltrate, pulmonary edema, or pleural effusion. Minimal left lung base atelectasis is identified. The visualized skeletal structures are stable. IMPRESSION: No active cardiopulmonary disease. Electronically Signed   By: Scherry Ran  Juel BurrowLin M.D.   On: 06/11/2016 17:17     ASSESSMENT: 75 yo with history of CP in past  Does have a history of HTN and DM Admitted yesterday with abdominal pain rising to chest   Not exertional  She says belly has been more swollen  R/O for MI  EKG without acute changes   I do not think discomfort is cardiac in origin  Probably related to hernia   I would not pursue any cardiac testing at present    2.  HTN  Adequately controlled    Will be available for questions

## 2016-06-12 NOTE — Care Management Note (Signed)
Case Management Note  Patient Details  Name: [name] BourbonJoyce H Shaff MRN: [id number] Date of Birth: 1941-12-06  Subjective/Objective:  Patient admitted from home with chest pain. She is independent with ADL's. Her PCP is Dr. Juanetta GoslingHawkins. She has oxygen at home but has not had to use it "in a long time".                   Action/Plan: Anticipate d/c home today with self care. No CM needs.    Expected Discharge Date:  06/12/16               Expected Discharge Plan:  Home/Self Care  In-House Referral:  NA  Discharge planning Services  CM Consult  Post Acute Care Choice:  NA Choice offered to:  NA  DME Arranged:    DME Agency:     HH Arranged:    HH Agency:     Status of Service:  Completed, signed off  If discussed at [employer] Stay Meetings, dates discussed:    Additional Comments:  Kaion Tisdale, Chrystine OilerSharley Diane, RN 06/12/2016, 11:35 AM

## 2016-06-12 NOTE — Progress Notes (Signed)
Subjective: She says she feels okay. She is not having any chest pain now. Troponins have been negative. It seems to me that her problem was likely was that she had problems with her known very large hernia and this produced some chest discomfort. However she does have a relatively vague history of cardiac disease and I think it would be reasonable to have cardiology see her and get her established with them to try to make some sort of definition of if she has cardiac disease and if so how severe.  Objective: Vital signs in last 24 hours: Temp:  [97.9 F (36.6 C)-99.3 F (37.4 C)] 97.9 F (36.6 C) (06/29 0540) Pulse Rate:  [67-86] 85 (06/29 0540) Resp:  [17-23] 20 (06/29 0540) BP: (104-147)/(46-97) 117/46 mmHg (06/29 0540) SpO2:  [95 %-100 %] 96 % (06/29 0540) Weight:  [81.647 kg (180 lb)] 81.647 kg (180 lb) (06/28 1520) Weight change:  Last BM Date: 06/11/16  Intake/Output from previous day: 06/28 0701 - 06/29 0700 In: -  Out: 1075 [Urine:1075]  PHYSICAL EXAM General appearance: alert, cooperative and no distress Resp: clear to auscultation bilaterally Cardio: regular rate and rhythm, S1, S2 normal, no murmur, click, rub or gallop GI: She has a very large hernia. We have discussed surgical repair multiple times in the past and she doesn't want to do that and I'm not sure that this is going to be very well surgically repairable anyway Extremities: extremities normal, atraumatic, no cyanosis or edema  Lab Results:  Results for orders placed or performed during the hospital encounter of 06/11/16 (from the past 48 hour(s))  Basic metabolic panel     Status: Abnormal   Collection Time: 06/11/16  3:55 PM  Result Value Ref Range   Sodium 133 (L) 135 - 145 mmol/L   Potassium 4.8 3.5 - 5.1 mmol/L   Chloride 103 101 - 111 mmol/L   CO2 23 22 - 32 mmol/L   Glucose, Bld 237 (H) 65 - 99 mg/dL   BUN 19 6 - 20 mg/dL   Creatinine, Ser 0.97 0.44 - 1.00 mg/dL   Calcium 8.6 (L) 8.9 - 10.3  mg/dL   GFR calc non Af Amer 56 (L) >60 mL/min   GFR calc Af Amer >60 >60 mL/min    Comment: (NOTE) The eGFR has been calculated using the CKD EPI equation. This calculation has not been validated in all clinical situations. eGFR's persistently <60 mL/min signify possible Chronic Kidney Disease.    Anion gap 7 5 - 15  CBC     Status: Abnormal   Collection Time: 06/11/16  3:55 PM  Result Value Ref Range   WBC 13.5 (H) 4.0 - 10.5 K/uL   RBC 4.21 3.87 - 5.11 MIL/uL   Hemoglobin 13.1 12.0 - 15.0 g/dL   HCT 38.4 36.0 - 46.0 %   MCV 91.2 78.0 - 100.0 fL   MCH 31.1 26.0 - 34.0 pg   MCHC 34.1 30.0 - 36.0 g/dL   RDW 12.5 11.5 - 15.5 %   Platelets 268 150 - 400 K/uL  Troponin I     Status: None   Collection Time: 06/11/16  3:55 PM  Result Value Ref Range   Troponin I <0.03 <0.03 ng/mL  Hepatic function panel     Status: None   Collection Time: 06/11/16  3:55 PM  Result Value Ref Range   Total Protein 7.4 6.5 - 8.1 g/dL   Albumin 4.1 3.5 - 5.0 g/dL   AST 22 15 -  41 U/L   ALT 21 14 - 54 U/L   Alkaline Phosphatase 50 38 - 126 U/L   Total Bilirubin 0.4 0.3 - 1.2 mg/dL   Bilirubin, Direct 0.1 0.1 - 0.5 mg/dL   Indirect Bilirubin 0.3 0.3 - 0.9 mg/dL  Lipase, blood     Status: None   Collection Time: 06/11/16  3:55 PM  Result Value Ref Range   Lipase 39 11 - 51 U/L  I-Stat Troponin, ED (not at [facility])     Status: None   Collection Time: 06/11/16  8:37 PM  Result Value Ref Range   Troponin i, poc 0.00 0.00 - 0.08 ng/mL   Comment 3            Comment: Due to the release kinetics of cTnI, a negative result within the first hours of the onset of symptoms does not rule out myocardial infarction with certainty. If myocardial infarction is still suspected, repeat the test at appropriate intervals.   Troponin I (q 6hr x 3)     Status: None   Collection Time: 06/11/16 11:33 PM  Result Value Ref Range   Troponin I <0.03 <0.03 ng/mL  Troponin I (q 6hr x 3)     Status: None   Collection  Time: 06/12/16  5:53 AM  Result Value Ref Range   Troponin I <0.03 <0.03 ng/mL  Glucose, capillary     Status: Abnormal   Collection Time: 06/12/16  7:27 AM  Result Value Ref Range   Glucose-Capillary 157 (H) 65 - 99 mg/dL   Comment 1 Notify RN    Comment 2 Document in Chart     ABGS No results for input(s): PHART, PO2ART, TCO2, HCO3 in the last 72 hours.  Invalid input(s): PCO2 CULTURES No results found for this or any previous visit (from the past 240 hour(s)). Studies/Results: Ct Abdomen Pelvis Wo Contrast  06/11/2016  CLINICAL DATA:  Abdominal pain ventral hernia.  Abdominal swelling. EXAM: CT ABDOMEN AND PELVIS WITHOUT CONTRAST TECHNIQUE: Multidetector CT imaging of the abdomen and pelvis was performed following the standard protocol without IV contrast. COMPARISON:  12/11/2008. FINDINGS: Lower chest:  Subsegmental atelectasis. Hepatobiliary: No focal abnormality in the liver on this study without intravenous contrast. No evidence of hepatomegaly. There is no evidence for gallstones, gallbladder wall thickening, or pericholecystic fluid. No intrahepatic or extrahepatic biliary dilation. Pancreas: No focal mass lesion. No dilatation of the main duct. No intraparenchymal cyst. No peripancreatic edema. Spleen: No splenomegaly. No focal mass lesion. Adrenals/Urinary Tract: No adrenal nodule or mass. No hydronephrosis or gross lesion seen in the right kidney on this study without IV contrast material. No right hydroureter left kidney surgically absent. The urinary bladder appears normal for the degree of distention. Stomach/Bowel: Stomach is nondistended. No gastric wall thickening. No evidence of outlet obstruction. Duodenum is normally positioned as is the ligament of Treitz. No small bowel wall thickening. No small bowel dilatation. The terminal ileum is normal. The appendix is normal. Diverticuli are seen scattered along the entire length of the colon without CT findings of diverticulitis.  Vascular/Lymphatic: There is abdominal aortic atherosclerosis without aneurysm. There is no gastrohepatic or hepatoduodenal ligament lymphadenopathy. No intraperitoneal or retroperitoneal lymphadenopathy. No pelvic sidewall lymphadenopathy. Reproductive: Uterus unremarkable.  There is no adnexal mass. Other: No intraperitoneal free fluid. Musculoskeletal: Complex left abdominal wall hernia contains loops of small bowel and sigmoid colon there is no evidence for bowel obstruction related to the hernia and although there may be a trace amount of  edema in the hernia sac, there is no gross free fluid in the hernia. No evidence for wall thickening in the small bowel or colon involved in the hernia. IMPRESSION: 1. Large, complex, left-sided the anterior abdominal wall hernia contains small bowel and sigmoid colon without evidence for bowel obstruction. There is no bowel wall thickening in the herniated loops in no gross fluid in the hernia sac. 2. Status post left nephrectomy. Imaging findings of potential clinical significance: Aortic Atherosclerosis (ICD10-170.0) Electronically Signed   By: Misty Stanley M.D.   On: 06/11/2016 20:53   Dg Chest 2 View  06/11/2016  CLINICAL DATA:  Intermittent right flank pain and central chest pain since yesterday. EXAM: CHEST  2 VIEW COMPARISON:  May 16, 2010 FINDINGS: The heart size and mediastinal contours are within normal limits. There is no focal infiltrate, pulmonary edema, or pleural effusion. Minimal left lung base atelectasis is identified. The visualized skeletal structures are stable. IMPRESSION: No active cardiopulmonary disease. Electronically Signed   By: Abelardo Diesel M.D.   On: 06/11/2016 17:17    Medications:  Prior to Admission:  Prescriptions prior to admission  Medication Sig Dispense Refill Last Dose  . aspirin 81 MG tablet Take 81 mg by mouth daily.   06/11/2016 at Unknown time  . fish oil-omega-3 fatty acids 1000 MG capsule Take 1 g by mouth 2 (two) times  daily.   06/11/2016 at Unknown time  . JANUMET 50-1000 MG tablet Take 1 tablet by mouth 2 (two) times daily.   06/11/2016 at Unknown time  . losartan (COZAAR) 50 MG tablet Take 25 mg by mouth daily.   06/11/2016 at Unknown time  . Multiple Vitamin (MULITIVITAMIN WITH MINERALS) TABS Take 1 tablet by mouth daily.   06/11/2016 at Unknown time  . OXYGEN-HELIUM IN Inhale into the lungs at bedtime as needed.      Scheduled: . aspirin EC  81 mg Oral Daily  . enoxaparin (LOVENOX) injection  40 mg Subcutaneous Q24H  . insulin aspart  0-9 Units Subcutaneous TID WC  . losartan  25 mg Oral Daily  . omega-3 acid ethyl esters  1 g Oral BID   Continuous: . sodium chloride 75 mL/hr at 06/11/16 1721   GTX:[id number], gi cocktail, morphine injection, ondansetron (ZOFRAN) IV  Assesment:She was admitted with chest pain. She has ruled out for MI. She has a history of cardiac disease but that is not very well-defined. Active Problems:   Chest pain    Plan: I will ask for cardiology to see her. If she doesn't need any inpatient workup I will plan to let her go home later today      Quintina Hakeem L 06/12/2016, 8:35 AM

## 2016-06-12 NOTE — Care Management Obs Status (Signed)
MEDICARE OBSERVATION STATUS NOTIFICATION   Patient Details  Name: [name] [name] MRN: [id number] Date of Birth: 1941-08-10   Medicare Observation Status Notification Given:  Yes    Jerian Morais, Chrystine OilerSharley Diane, RN 06/12/2016, 11:37 AM

## 2016-06-12 NOTE — Progress Notes (Signed)
Patient with orders to be discharge home. Discharge instructions given, patient verbalized understanding. Patient stable.

## 2016-06-13 LAB — HEMOGLOBIN A1C
HEMOGLOBIN A1C: 6.8 % — AB (ref 4.8–5.6)
Mean Plasma Glucose: 148 mg/dL

## 2016-06-19 DIAGNOSIS — J449 Chronic obstructive pulmonary disease, unspecified: Secondary | ICD-10-CM | POA: Diagnosis not present

## 2016-06-19 DIAGNOSIS — K210 Gastro-esophageal reflux disease with esophagitis: Secondary | ICD-10-CM | POA: Diagnosis not present

## 2016-06-19 DIAGNOSIS — E1165 Type 2 diabetes mellitus with hyperglycemia: Secondary | ICD-10-CM | POA: Diagnosis not present

## 2016-06-19 DIAGNOSIS — I2510 Atherosclerotic heart disease of native coronary artery without angina pectoris: Secondary | ICD-10-CM | POA: Diagnosis not present

## 2016-06-19 NOTE — Discharge Summary (Signed)
Physician Discharge Summary  Patient ID: [name] [name] MRN: [id number] DOB/AGE: November 11, 1941 75 y.o. Primary Care Physician:No primary care provider on file. Admit date: 06/11/2016 Discharge date: 06/19/2016    Discharge Diagnoses:   Active Problems:   Chest pain Hypertension Diabetes COPD Large incisional hernia Anxiety and depression    Medication List    TAKE these medications        aspirin 81 MG tablet  Take 81 mg by mouth daily.     fish oil-omega-3 fatty acids 1000 MG capsule  Take 1 g by mouth 2 (two) times daily.     JANUMET 50-1000 MG tablet  Generic drug:  sitaGLIPtin-metformin  Take 1 tablet by mouth 2 (two) times daily.     losartan 50 MG tablet  Commonly known as:  COZAAR  Take 25 mg by mouth daily.     multivitamin with minerals Tabs tablet  Take 1 tablet by mouth daily.     OXYGEN  Inhale into the lungs at bedtime as needed.        Discharged Condition:Improved    Consults: Cardiology  Significant Diagnostic Studies: Ct Abdomen Pelvis Wo Contrast  06/11/2016  CLINICAL DATA:  Abdominal pain ventral hernia.  Abdominal swelling. EXAM: CT ABDOMEN AND PELVIS WITHOUT CONTRAST TECHNIQUE: Multidetector CT imaging of the abdomen and pelvis was performed following the standard protocol without IV contrast. COMPARISON:  12/11/2008. FINDINGS: Lower chest:  Subsegmental atelectasis. Hepatobiliary: No focal abnormality in the liver on this study without intravenous contrast. No evidence of hepatomegaly. There is no evidence for gallstones, gallbladder wall thickening, or pericholecystic fluid. No intrahepatic or extrahepatic biliary dilation. Pancreas: No focal mass lesion. No dilatation of the main duct. No intraparenchymal cyst. No peripancreatic edema. Spleen: No splenomegaly. No focal mass lesion. Adrenals/Urinary Tract: No adrenal nodule or mass. No hydronephrosis or gross lesion seen in the right kidney on this study without IV contrast material. No right  hydroureter left kidney surgically absent. The urinary bladder appears normal for the degree of distention. Stomach/Bowel: Stomach is nondistended. No gastric wall thickening. No evidence of outlet obstruction. Duodenum is normally positioned as is the ligament of Treitz. No small bowel wall thickening. No small bowel dilatation. The terminal ileum is normal. The appendix is normal. Diverticuli are seen scattered along the entire length of the colon without CT findings of diverticulitis. Vascular/Lymphatic: There is abdominal aortic atherosclerosis without aneurysm. There is no gastrohepatic or hepatoduodenal ligament lymphadenopathy. No intraperitoneal or retroperitoneal lymphadenopathy. No pelvic sidewall lymphadenopathy. Reproductive: Uterus unremarkable.  There is no adnexal mass. Other: No intraperitoneal free fluid. Musculoskeletal: Complex left abdominal wall hernia contains loops of small bowel and sigmoid colon there is no evidence for bowel obstruction related to the hernia and although there may be a trace amount of edema in the hernia sac, there is no gross free fluid in the hernia. No evidence for wall thickening in the small bowel or colon involved in the hernia. IMPRESSION: 1. Large, complex, left-sided the anterior abdominal wall hernia contains small bowel and sigmoid colon without evidence for bowel obstruction. There is no bowel wall thickening in the herniated loops in no gross fluid in the hernia sac. 2. Status post left nephrectomy. Imaging findings of potential clinical significance: Aortic Atherosclerosis (ICD10-170.0) Electronically Signed   By: Kennith CenterEric  Mansell M.D.   On: 06/11/2016 20:53   Dg Chest 2 View  06/11/2016  CLINICAL DATA:  Intermittent right flank pain and central chest pain since yesterday. EXAM: CHEST  2 VIEW COMPARISON:  May 16, 2010 FINDINGS: The heart size and mediastinal contours are within normal limits. There is no focal infiltrate, pulmonary edema, or pleural effusion.  Minimal left lung base atelectasis is identified. The visualized skeletal structures are stable. IMPRESSION: No active cardiopulmonary disease. Electronically Signed   By: Sherian ReinWei-Chen  Lin M.D.   On: 06/11/2016 17:17    Lab Results: Basic Metabolic Panel: No results for input(s): NA, K, CL, CO2, GLUCOSE, BUN, CREATININE, CALCIUM, MG, PHOS in the last 72 hours. Liver Function Tests: No results for input(s): AST, ALT, ALKPHOS, BILITOT, PROT, ALBUMIN in the last 72 hours.   CBC: No results for input(s): WBC, NEUTROABS, HGB, HCT, MCV, PLT in the last 72 hours.  No results found for this or any previous visit (from the past 240 hour(s)).   Hospital Course: This is a 75 year old came to the emergency department because of chest pain. She said that it felt like she had pressure. This seemed to have started when she developed problems with a long known very large abdominal hernia and had abdominal discomfort and some bloating. When she came to the emergency department her troponin was negative she had EKG that did not show any acute changes. She ruled out for MI had cardiology consultation and it was felt that her problem was related to the hernia and not due to cardiac disease. She had significant flatulence and burping and her pain and bloating resolved. She was ready for discharge  Discharge Exam: Blood pressure 120/61, pulse 71, temperature 98 F (36.7 C), temperature source Oral, resp. rate 20, height 5' (1.524 m), weight 81.647 kg (180 lb), SpO2 95 %. She is awake and alert. She is anxious. Her chest is clear. Her heart is regular.  Disposition: Home      Signed: Onell Mcmath L   06/19/2016, 7:22 AM

## 2016-06-30 DIAGNOSIS — E1151 Type 2 diabetes mellitus with diabetic peripheral angiopathy without gangrene: Secondary | ICD-10-CM | POA: Diagnosis not present

## 2016-06-30 DIAGNOSIS — E1140 Type 2 diabetes mellitus with diabetic neuropathy, unspecified: Secondary | ICD-10-CM | POA: Diagnosis not present

## 2016-09-08 DIAGNOSIS — E1140 Type 2 diabetes mellitus with diabetic neuropathy, unspecified: Secondary | ICD-10-CM | POA: Diagnosis not present

## 2016-09-08 DIAGNOSIS — E1151 Type 2 diabetes mellitus with diabetic peripheral angiopathy without gangrene: Secondary | ICD-10-CM | POA: Diagnosis not present

## 2016-09-23 DIAGNOSIS — E119 Type 2 diabetes mellitus without complications: Secondary | ICD-10-CM | POA: Diagnosis not present

## 2016-09-23 DIAGNOSIS — J449 Chronic obstructive pulmonary disease, unspecified: Secondary | ICD-10-CM | POA: Diagnosis not present

## 2016-09-23 DIAGNOSIS — I2510 Atherosclerotic heart disease of native coronary artery without angina pectoris: Secondary | ICD-10-CM | POA: Diagnosis not present

## 2016-09-23 DIAGNOSIS — I10 Essential (primary) hypertension: Secondary | ICD-10-CM | POA: Diagnosis not present

## 2016-09-23 DIAGNOSIS — Z23 Encounter for immunization: Secondary | ICD-10-CM | POA: Diagnosis not present

## 2016-12-24 DIAGNOSIS — J449 Chronic obstructive pulmonary disease, unspecified: Secondary | ICD-10-CM | POA: Diagnosis not present

## 2016-12-24 DIAGNOSIS — I10 Essential (primary) hypertension: Secondary | ICD-10-CM | POA: Diagnosis not present

## 2016-12-24 DIAGNOSIS — I2510 Atherosclerotic heart disease of native coronary artery without angina pectoris: Secondary | ICD-10-CM | POA: Diagnosis not present

## 2016-12-24 DIAGNOSIS — E119 Type 2 diabetes mellitus without complications: Secondary | ICD-10-CM | POA: Diagnosis not present

## 2017-03-24 DIAGNOSIS — I2510 Atherosclerotic heart disease of native coronary artery without angina pectoris: Secondary | ICD-10-CM | POA: Diagnosis not present

## 2017-03-24 DIAGNOSIS — J449 Chronic obstructive pulmonary disease, unspecified: Secondary | ICD-10-CM | POA: Diagnosis not present

## 2017-03-24 DIAGNOSIS — E1165 Type 2 diabetes mellitus with hyperglycemia: Secondary | ICD-10-CM | POA: Diagnosis not present

## 2017-03-24 DIAGNOSIS — K210 Gastro-esophageal reflux disease with esophagitis: Secondary | ICD-10-CM | POA: Diagnosis not present

## 2017-03-24 DIAGNOSIS — J309 Allergic rhinitis, unspecified: Secondary | ICD-10-CM | POA: Diagnosis not present

## 2017-06-24 ENCOUNTER — Encounter: Payer: Self-pay | Admitting: Family Medicine

## 2017-06-24 DIAGNOSIS — J309 Allergic rhinitis, unspecified: Secondary | ICD-10-CM | POA: Diagnosis not present

## 2017-06-24 DIAGNOSIS — K210 Gastro-esophageal reflux disease with esophagitis: Secondary | ICD-10-CM | POA: Diagnosis not present

## 2017-06-24 DIAGNOSIS — J449 Chronic obstructive pulmonary disease, unspecified: Secondary | ICD-10-CM | POA: Diagnosis not present

## 2017-06-24 DIAGNOSIS — I2510 Atherosclerotic heart disease of native coronary artery without angina pectoris: Secondary | ICD-10-CM | POA: Diagnosis not present

## 2017-06-24 DIAGNOSIS — Z0000 Encounter for general adult medical examination without abnormal findings: Secondary | ICD-10-CM | POA: Diagnosis not present

## 2017-06-24 DIAGNOSIS — E1165 Type 2 diabetes mellitus with hyperglycemia: Secondary | ICD-10-CM | POA: Diagnosis not present

## 2017-06-24 LAB — HM DIABETES FOOT EXAM

## 2017-07-01 ENCOUNTER — Emergency Department (HOSPITAL_COMMUNITY): Payer: Medicare Other

## 2017-07-01 ENCOUNTER — Emergency Department (HOSPITAL_COMMUNITY)
Admission: EM | Admit: 2017-07-01 | Discharge: 2017-07-02 | Disposition: A | Discharge status: Home / Self Care | Payer: Medicare Other | Attending: Emergency Medicine | Admitting: Emergency Medicine

## 2017-07-01 ENCOUNTER — Encounter (HOSPITAL_COMMUNITY): Payer: Self-pay | Admitting: Emergency Medicine

## 2017-07-01 DIAGNOSIS — K439 Ventral hernia without obstruction or gangrene: Secondary | ICD-10-CM | POA: Diagnosis not present

## 2017-07-01 DIAGNOSIS — E119 Type 2 diabetes mellitus without complications: Secondary | ICD-10-CM | POA: Diagnosis not present

## 2017-07-01 DIAGNOSIS — B379 Candidiasis, unspecified: Secondary | ICD-10-CM | POA: Diagnosis not present

## 2017-07-01 DIAGNOSIS — Z7984 Long term (current) use of oral hypoglycemic drugs: Secondary | ICD-10-CM | POA: Diagnosis not present

## 2017-07-01 DIAGNOSIS — Z7982 Long term (current) use of aspirin: Secondary | ICD-10-CM | POA: Diagnosis not present

## 2017-07-01 DIAGNOSIS — I10 Essential (primary) hypertension: Secondary | ICD-10-CM | POA: Diagnosis not present

## 2017-07-01 DIAGNOSIS — K469 Unspecified abdominal hernia without obstruction or gangrene: Secondary | ICD-10-CM | POA: Diagnosis not present

## 2017-07-01 DIAGNOSIS — Z79899 Other long term (current) drug therapy: Secondary | ICD-10-CM | POA: Insufficient documentation

## 2017-07-01 DIAGNOSIS — B372 Candidiasis of skin and nail: Secondary | ICD-10-CM | POA: Diagnosis not present

## 2017-07-01 DIAGNOSIS — R109 Unspecified abdominal pain: Secondary | ICD-10-CM | POA: Diagnosis present

## 2017-07-01 DIAGNOSIS — K5730 Diverticulosis of large intestine without perforation or abscess without bleeding: Secondary | ICD-10-CM | POA: Diagnosis not present

## 2017-07-01 LAB — COMPREHENSIVE METABOLIC PANEL
ALT: 18 U/L (ref 14–54)
ANION GAP: 10 (ref 5–15)
AST: 20 U/L (ref 15–41)
Albumin: 3.7 g/dL (ref 3.5–5.0)
Alkaline Phosphatase: 49 U/L (ref 38–126)
BUN: 23 mg/dL — AB (ref 6–20)
CALCIUM: 9.3 mg/dL (ref 8.9–10.3)
CO2: 22 mmol/L (ref 22–32)
Chloride: 106 mmol/L (ref 101–111)
Creatinine, Ser: 1.16 mg/dL — ABNORMAL HIGH (ref 0.44–1.00)
GFR calc Af Amer: 52 mL/min — ABNORMAL LOW (ref >60)
GFR, EST NON AFRICAN AMERICAN: 45 mL/min — AB (ref >60)
Glucose, Bld: 196 mg/dL — ABNORMAL HIGH (ref 65–99)
POTASSIUM: 4.7 mmol/L (ref 3.5–5.1)
Sodium: 138 mmol/L (ref 135–145)
TOTAL PROTEIN: 7.3 g/dL (ref 6.5–8.1)
Total Bilirubin: 0.4 mg/dL (ref 0.3–1.2)

## 2017-07-01 LAB — CBC
HEMATOCRIT: 35.7 % — AB (ref 36.0–46.0)
HEMOGLOBIN: 12.1 g/dL (ref 12.0–15.0)
MCH: 31.2 pg (ref 26.0–34.0)
MCHC: 33.9 g/dL (ref 30.0–36.0)
MCV: 92 fL (ref 78.0–100.0)
Platelets: 289 10*3/uL (ref 150–400)
RBC: 3.88 MIL/uL (ref 3.87–5.11)
RDW: 12.6 % (ref 11.5–15.5)
WBC: 12.1 10*3/uL — AB (ref 4.0–10.5)

## 2017-07-01 LAB — LIPASE, BLOOD: Lipase: 35 U/L (ref 11–51)

## 2017-07-01 LAB — URINALYSIS, ROUTINE W REFLEX MICROSCOPIC
BILIRUBIN URINE: NEGATIVE
Bacteria, UA: NONE SEEN
Glucose, UA: NEGATIVE mg/dL
HGB URINE DIPSTICK: NEGATIVE
KETONES UR: NEGATIVE mg/dL
NITRITE: NEGATIVE
PH: 5 (ref 5.0–8.0)
Protein, ur: NEGATIVE mg/dL
Specific Gravity, Urine: 1.02 (ref 1.005–1.030)

## 2017-07-01 LAB — I-STAT CG4 LACTIC ACID, ED: Lactic Acid, Venous: 2.01 mmol/L (ref 0.5–1.9)

## 2017-07-01 MED ORDER — ONDANSETRON HCL 4 MG/2ML IJ SOLN
4.0000 mg | Freq: Once | INTRAMUSCULAR | Status: AC
  Administered 2017-07-01: 4 mg via INTRAVENOUS
  Filled 2017-07-01: qty 2

## 2017-07-01 MED ORDER — SODIUM CHLORIDE 0.9 % IV BOLUS (SEPSIS)
500.0000 mL | Freq: Once | INTRAVENOUS | Status: AC
  Administered 2017-07-01: 500 mL via INTRAVENOUS

## 2017-07-01 NOTE — ED Notes (Signed)
Pt reports she has had L sided abd hernia since the 70s. Denies new sensations of giving way. Had normal BM this morning. States she was told her hernia was inoperable. Has noted skin breakdown in groin and fold of abd with serosanguinous d/c and bleeding per pt.

## 2017-07-01 NOTE — ED Provider Notes (Signed)
At signout, f/u on CT imaging Will need treatment for topical candidiasis   Zadie RhineWickline, Klayton Monie, MD 07/01/17 [id number]

## 2017-07-01 NOTE — ED Triage Notes (Signed)
Pt c/o increased abd hernia pain since Sunday.

## 2017-07-01 NOTE — ED Provider Notes (Signed)
[facility] DEPT Provider Note   CSN: [id number] Arrival date & time: 07/01/17  1944     History   Chief Complaint Chief Complaint  Patient presents with  . Abdominal Pain    HPI [name] [name] is a 76 y.o. female.  HPI Patient with chronic abdominal wall hernias states that the pain has acutely worsened since Sunday evening. She's had nausea but no vomiting. Says she's had several small bowel movements today. She also complains of irritation, bleeding and redness in the fold between the hernia in the abdominal wall. No fever or chills. No urinary symptoms. States she occasionally takes Tylenol for pain. Past Medical History:  Diagnosis Date  . Diabetes mellitus   . Hypertension   . Renal disorder     Patient Active Problem List   Diagnosis Date Noted  . Chest pain 06/11/2016  . DIABETES MELLITUS 06/13/2009  . GERD 06/13/2009    Past Surgical History:  Procedure Laterality Date  . NEPHRECTOMY      OB History    No data available       Home Medications    Prior to Admission medications   Medication Sig Start Date End Date Taking? Authorizing Provider  aspirin 81 MG tablet Take 81 mg by mouth daily.   Yes [provider]  fish oil-omega-3 fatty acids 1000 MG capsule Take 1 g by mouth 2 (two) times daily.   Yes [provider]  JANUMET 50-1000 MG tablet Take 1 tablet by mouth 2 (two) times daily. 05/13/16  Yes [provider]  losartan (COZAAR) 50 MG tablet Take 25 mg by mouth daily.   Yes [provider]  Multiple Vitamin (MULITIVITAMIN WITH MINERALS) TABS Take 1 tablet by mouth daily.   Yes [provider]  HYDROcodone-acetaminophen (NORCO/VICODIN) 5-325 MG tablet Take 1 tablet by mouth every 6 (six) hours as needed for severe pain. 07/02/17   Zadie Rhine, MD  nystatin (MYCOSTATIN/NYSTOP) powder Apply topically 3 (three) times daily. 07/02/17   Zadie Rhine, MD    Family History History reviewed. No  pertinent family history.  Social History Social History  Substance Use Topics  . Smoking status: Never Smoker  . Smokeless tobacco: Never Used  . Alcohol use No     Allergies   Contrast media [iodinated diagnostic agents]; Morphine and related; Other; Penicillins; and Codeine   Review of Systems Review of Systems  Constitutional: Negative for chills and fever.  Eyes: Negative for visual disturbance.  Respiratory: Negative for cough and shortness of breath.   Cardiovascular: Negative for chest pain.  Gastrointestinal: Positive for abdominal distention, abdominal pain and nausea. Negative for blood in stool, constipation, diarrhea and vomiting.  Genitourinary: Negative for dysuria, flank pain and frequency.  Musculoskeletal: Negative for back pain, myalgias, neck pain and neck stiffness.  Skin: Positive for rash.  Neurological: Negative for dizziness, weakness, light-headedness, numbness and headaches.  All other systems reviewed and are negative.    Physical Exam Updated Vital Signs BP 124/62   Pulse 67   Temp 98.5 F (36.9 C) (Oral)   Resp 18   Ht 5' (1.524 m)   Wt 63.5 kg (140 lb)   SpO2 98%   BMI 27.34 kg/m   Physical Exam  Constitutional: She is oriented to person, place, and time. She appears well-developed and well-nourished. No distress.  HENT:  Head: Normocephalic and atraumatic.  Mouth/Throat: Oropharynx is clear and moist. No oropharyngeal exudate.  Eyes: Pupils are equal, round, and reactive to light.  EOM are normal.  Neck: Normal range of motion. Neck supple.  Cardiovascular: Normal rate and regular rhythm.  Exam reveals no gallop and no friction rub.   No murmur heard. Pulmonary/Chest: Effort normal and breath sounds normal.  Abdominal: Soft. She exhibits distension and mass. There is tenderness. There is no rebound and no guarding. A hernia is present.  Patient with very large left-sided lower abdominal wall hernia which wraps around to the left  flank. Very tender to palpation diffusely. High-pitched bowel sounds heard in the mass.  Musculoskeletal: Normal range of motion. She exhibits no edema or tenderness.  Neurological: She is alert and oriented to person, place, and time.  Skin: Skin is warm and dry. Capillary refill takes less than 2 seconds. No rash noted. She is not diaphoretic. There is erythema.  Patient has erythematous intertriginous rash with satellite lesions in the fold between the hernia and the abdominal wall in the left lower abdomen. No obvious bleeding.  Psychiatric: She has a normal mood and affect. Her behavior is normal.  Nursing note and vitals reviewed.    ED Treatments / Results  Labs (all labs ordered are listed, but only abnormal results are displayed) Labs Reviewed  COMPREHENSIVE METABOLIC PANEL - Abnormal; Notable for the following:       Result Value   Glucose, Bld 196 (*)    BUN 23 (*)    Creatinine, Ser 1.16 (*)    GFR calc non Af Amer 45 (*)    GFR calc Af Amer 52 (*)    All other components within normal limits  CBC - Abnormal; Notable for the following:    WBC 12.1 (*)    HCT 35.7 (*)    All other components within normal limits  URINALYSIS, ROUTINE W REFLEX MICROSCOPIC - Abnormal; Notable for the following:    Leukocytes, UA TRACE (*)    Squamous Epithelial / LPF 0-5 (*)    All other components within normal limits  I-STAT CG4 LACTIC ACID, ED - Abnormal; Notable for the following:    Lactic Acid, Venous 2.01 (*)    All other components within normal limits  LIPASE, BLOOD    EKG  EKG Interpretation None       Radiology Ct Abdomen Pelvis Wo Contrast  Result Date: 07/02/2017 CLINICAL DATA:  76 year old female with abdominal pain. EXAM: CT ABDOMEN AND PELVIS WITHOUT CONTRAST TECHNIQUE: Multidetector CT imaging of the abdomen and pelvis was performed following the standard protocol without IV contrast. COMPARISON:  Abdominal CT dated 06/11/2016 FINDINGS: Evaluation of this exam is  limited in the absence of intravenous contrast. Lower chest: The visualized lung bases are clear. There is coronary vascular calcification. No intra-abdominal free air or free fluid. Hepatobiliary: No focal liver abnormality is seen. No gallstones, gallbladder wall thickening, or biliary dilatation. Pancreas: Unremarkable. No pancreatic ductal dilatation or surrounding inflammatory changes. Spleen: Normal in size without focal abnormality. Adrenals/Urinary Tract: The adrenal glands are unremarkable. There is a left nephrectomy. The right kidney is unremarkable. The visualized right ureter and urinary bladder appear unremarkable. Stomach/Bowel: Oral contrast opacifies the stomach and multiple loops of small bowel. The stomach is distended. No evidence of gastric outlet obstruction. There is a 3.3 cm distal duodenal diverticulum without active inflammation. There is a large complex left anterolateral abdominal wall hernia containing multiple loops of small bowel as well as a segment of the sigmoid colon. There is no evidence of obstruction of the bowel in the hernia sac. Mild stranding of the  mesentery in the hernia without free fluid. The appearance of the hernia is similar to prior CT. There is large amount of stool throughout the colon. There are scattered colonic diverticula without active inflammatory changes. There is no evidence of bowel obstruction or active inflammation. Normal appendix. Vascular/Lymphatic: There is advanced aortoiliac atherosclerotic disease. Evaluation of the vasculature is limited on this noncontrast CT. No portal venous gas identified. There is no adenopathy. Reproductive: The uterus and ovaries are grossly unremarkable. No pelvic mass. Other: Complex left anterolateral hernia containing loops of small bowel and segment of sigmoid colon with probable mild edema. These findings are similar to the prior CT. Musculoskeletal: Degenerative changes of the spine. No acute osseous pathology.  IMPRESSION: 1. Large complex left anterolateral pelvic wall hernia containing loops of small bowel and a segment of sigmoid colon. The appearance of the hernia is similar to prior CT. No evidence of obstruction. 2. Scattered colonic diverticula as well as a distal duodenal diverticulum without active inflammatory changes. 3. Moderate colonic stool burden. No bowel obstruction or active inflammation. Normal appendix. 4. Status post prior left nephrectomy. No hydronephrosis on the right. 5.  Aortic Atherosclerosis (ICD10-I70.0). Electronically Signed   By: Elgie Collard M.D.   On: 07/02/2017 01:00    Procedures Procedures (including critical care time)  Medications Ordered in ED Medications  sodium chloride 0.9 % bolus 500 mL (0 mLs Intravenous Stopped 07/01/17 2350)  ondansetron (ZOFRAN) injection 4 mg (4 mg Intravenous Given 07/01/17 2158)     Initial Impression / Assessment and Plan / ED Course  I have reviewed the triage vital signs and the nursing notes.  Pertinent labs & imaging results that were available during my care of the patient were reviewed by me and considered in my medical decision making (see chart for details).    Signed out to oncoming emergency physician pending CT abdomen and pelvis.   Final Clinical Impressions(s) / ED Diagnoses   Final diagnoses:  Abdominal wall hernia  Intertriginous candidiasis    New Prescriptions Discharge Medication List as of 07/02/2017  1:35 AM    START taking these medications   Details  nystatin (MYCOSTATIN/NYSTOP) powder Apply topically 3 (three) times daily., Starting Thu 07/02/2017, Print         Loren Racer, MD 07/03/17 2146

## 2017-07-02 DIAGNOSIS — K469 Unspecified abdominal hernia without obstruction or gangrene: Secondary | ICD-10-CM | POA: Diagnosis not present

## 2017-07-02 DIAGNOSIS — K5730 Diverticulosis of large intestine without perforation or abscess without bleeding: Secondary | ICD-10-CM | POA: Diagnosis not present

## 2017-07-02 DIAGNOSIS — K439 Ventral hernia without obstruction or gangrene: Secondary | ICD-10-CM | POA: Diagnosis not present

## 2017-07-02 MED ORDER — NYSTATIN 100000 UNIT/GM EX POWD: Freq: Three times a day (TID) | CUTANEOUS | 0 refills | Status: DC

## 2017-07-02 MED ORDER — HYDROCODONE-ACETAMINOPHEN 5-325 MG PO TABS: 1.0000 | ORAL_TABLET | Freq: Four times a day (QID) | ORAL | 0 refills | Status: DC | PRN

## 2017-07-02 NOTE — ED Notes (Signed)
Informed pt that her daughter had called

## 2017-07-02 NOTE — ED Provider Notes (Signed)
Pt stable/sleeping No distress Ct imaging negative Pt feels well for d/c home Referred to gen. Surgery Discussed return precautions Ordered nystatin at time of discharge    Zadie RhineWickline, Prudence Heiny, MD 07/02/17 [id number]

## 2017-07-09 MED FILL — Hydrocodone-Acetaminophen Tab 5-325 MG: ORAL | Qty: 6 | Status: AC

## 2017-07-16 LAB — FECAL OCCULT BLOOD, GUAIAC: Fecal Occult Blood: NEGATIVE

## 2017-07-21 DIAGNOSIS — Z1211 Encounter for screening for malignant neoplasm of colon: Secondary | ICD-10-CM | POA: Diagnosis not present

## 2017-07-27 ENCOUNTER — Emergency Department (HOSPITAL_COMMUNITY): Payer: Medicare Other

## 2017-07-27 ENCOUNTER — Emergency Department (HOSPITAL_COMMUNITY)
Admission: EM | Admit: 2017-07-27 | Discharge: 2017-07-28 | Disposition: A | Discharge status: Home / Self Care | Payer: Medicare Other | Attending: Emergency Medicine | Admitting: Emergency Medicine

## 2017-07-27 ENCOUNTER — Encounter (HOSPITAL_COMMUNITY): Payer: Self-pay

## 2017-07-27 DIAGNOSIS — K439 Ventral hernia without obstruction or gangrene: Secondary | ICD-10-CM | POA: Insufficient documentation

## 2017-07-27 DIAGNOSIS — Z7984 Long term (current) use of oral hypoglycemic drugs: Secondary | ICD-10-CM | POA: Diagnosis not present

## 2017-07-27 DIAGNOSIS — Z7982 Long term (current) use of aspirin: Secondary | ICD-10-CM | POA: Insufficient documentation

## 2017-07-27 DIAGNOSIS — R1084 Generalized abdominal pain: Secondary | ICD-10-CM | POA: Insufficient documentation

## 2017-07-27 DIAGNOSIS — R109 Unspecified abdominal pain: Secondary | ICD-10-CM | POA: Diagnosis not present

## 2017-07-27 DIAGNOSIS — Z79899 Other long term (current) drug therapy: Secondary | ICD-10-CM | POA: Insufficient documentation

## 2017-07-27 DIAGNOSIS — R05 Cough: Secondary | ICD-10-CM | POA: Diagnosis not present

## 2017-07-27 DIAGNOSIS — R1111 Vomiting without nausea: Secondary | ICD-10-CM | POA: Diagnosis not present

## 2017-07-27 DIAGNOSIS — I10 Essential (primary) hypertension: Secondary | ICD-10-CM | POA: Diagnosis not present

## 2017-07-27 DIAGNOSIS — R1110 Vomiting, unspecified: Secondary | ICD-10-CM | POA: Insufficient documentation

## 2017-07-27 DIAGNOSIS — R079 Chest pain, unspecified: Secondary | ICD-10-CM | POA: Diagnosis not present

## 2017-07-27 DIAGNOSIS — E119 Type 2 diabetes mellitus without complications: Secondary | ICD-10-CM | POA: Insufficient documentation

## 2017-07-27 LAB — COMPREHENSIVE METABOLIC PANEL
ALBUMIN: 4.1 g/dL (ref 3.5–5.0)
ALK PHOS: 50 U/L (ref 38–126)
ALT: 22 U/L (ref 14–54)
AST: 23 U/L (ref 15–41)
Anion gap: 13 (ref 5–15)
BILIRUBIN TOTAL: 0.5 mg/dL (ref 0.3–1.2)
BUN: 22 mg/dL — AB (ref 6–20)
CALCIUM: 9.4 mg/dL (ref 8.9–10.3)
CO2: 26 mmol/L (ref 22–32)
CREATININE: 1.22 mg/dL — AB (ref 0.44–1.00)
Chloride: 103 mmol/L (ref 101–111)
GFR calc Af Amer: 49 mL/min — ABNORMAL LOW (ref >60)
GFR, EST NON AFRICAN AMERICAN: 42 mL/min — AB (ref >60)
GLUCOSE: 258 mg/dL — AB (ref 65–99)
Potassium: 4.6 mmol/L (ref 3.5–5.1)
Sodium: 142 mmol/L (ref 135–145)
TOTAL PROTEIN: 7.9 g/dL (ref 6.5–8.1)

## 2017-07-27 LAB — CBC
HCT: 37.7 % (ref 36.0–46.0)
Hemoglobin: 12.8 g/dL (ref 12.0–15.0)
MCH: 31 pg (ref 26.0–34.0)
MCHC: 34 g/dL (ref 30.0–36.0)
MCV: 91.3 fL (ref 78.0–100.0)
Platelets: 291 10*3/uL (ref 150–400)
RBC: 4.13 MIL/uL (ref 3.87–5.11)
RDW: 12.9 % (ref 11.5–15.5)
WBC: 14.9 10*3/uL — ABNORMAL HIGH (ref 4.0–10.5)

## 2017-07-27 LAB — LIPASE, BLOOD: Lipase: 43 U/L (ref 11–51)

## 2017-07-27 LAB — URINALYSIS, ROUTINE W REFLEX MICROSCOPIC
Bilirubin Urine: NEGATIVE
Glucose, UA: >=500 mg/dL — AB
HGB URINE DIPSTICK: NEGATIVE
Ketones, ur: 20 mg/dL — AB
NITRITE: NEGATIVE
PH: 5 (ref 5.0–8.0)
Protein, ur: 100 mg/dL — AB
SPECIFIC GRAVITY, URINE: 1.023 (ref 1.005–1.030)

## 2017-07-27 LAB — TROPONIN I

## 2017-07-27 MED ORDER — ONDANSETRON HCL 4 MG/2ML IJ SOLN
4.0000 mg | Freq: Once | INTRAMUSCULAR | Status: AC | PRN
  Administered 2017-07-27: 4 mg via INTRAVENOUS
  Filled 2017-07-27: qty 2

## 2017-07-27 NOTE — ED Notes (Signed)
Reminded pt urine was needed

## 2017-07-27 NOTE — ED Notes (Signed)
EKG given to Dr. Mesner. 

## 2017-07-27 NOTE — ED Triage Notes (Addendum)
Reports of abdominal pain that radiates into left flank/chest. Started vomiting today. Patient diaphoretic.

## 2017-07-28 DIAGNOSIS — R109 Unspecified abdominal pain: Secondary | ICD-10-CM | POA: Diagnosis not present

## 2017-07-28 DIAGNOSIS — E119 Type 2 diabetes mellitus without complications: Secondary | ICD-10-CM | POA: Diagnosis not present

## 2017-07-28 DIAGNOSIS — R1111 Vomiting without nausea: Secondary | ICD-10-CM | POA: Diagnosis not present

## 2017-07-28 DIAGNOSIS — R1084 Generalized abdominal pain: Secondary | ICD-10-CM | POA: Diagnosis not present

## 2017-07-28 DIAGNOSIS — K469 Unspecified abdominal hernia without obstruction or gangrene: Secondary | ICD-10-CM | POA: Diagnosis not present

## 2017-07-28 DIAGNOSIS — I10 Essential (primary) hypertension: Secondary | ICD-10-CM | POA: Diagnosis not present

## 2017-07-28 MED ORDER — HYDROCODONE-ACETAMINOPHEN 5-325 MG PO TABS: 1.0000 | ORAL_TABLET | Freq: Four times a day (QID) | ORAL | 0 refills | Status: DC | PRN

## 2017-07-28 MED ORDER — FENTANYL CITRATE (PF) 100 MCG/2ML IJ SOLN
25.0000 ug | Freq: Once | INTRAMUSCULAR | Status: AC
  Administered 2017-07-28: 25 ug via INTRAVENOUS
  Filled 2017-07-28: qty 2

## 2017-07-28 NOTE — ED Provider Notes (Signed)
[facility] Provider Note   CSN: [id number] Arrival date & time: 07/27/17  4098     History   Chief Complaint Chief Complaint  Patient presents with  . Abdominal Pain  . Emesis    HPI [name] [name] is a 76 y.o. female.  Patient with long-standing large ventral hernia. Patient states is been there since the 1970s. 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 Patient was seen in mid July for similar symptoms with negative CT here. Dr. Juanetta Gosling as her primary care doctor. Patient states his left sided abdominal pain left flank pain radiates to the epigastric area. Associated with nausea and did have some vomiting today. Patient states that she had diaphoresis. Not diaphoretic here today. No fevers.Chest discomfort is mostly epigastric area. Not as severe as the left lower quadrant pain. Patient has a known history of hypertension and diabetes.      Past Medical History:  Diagnosis Date  . Diabetes mellitus   . Hypertension   . Renal disorder     Patient Active Problem List   Diagnosis Date Noted  . Chest pain 06/11/2016  . DIABETES MELLITUS 06/13/2009  . GERD 06/13/2009    Past Surgical History:  Procedure Laterality Date  . NEPHRECTOMY      OB History    No data available       Home Medications    Prior to Admission medications   Medication Sig Start Date End Date Taking? Authorizing Provider  aspirin 81 MG tablet Take 81 mg by mouth every evening.    Yes [provider]  fish oil-omega-3 fatty acids 1000 MG capsule Take 1 g by mouth 2 (two) times daily.   Yes [provider]  JANUMET 50-1000 MG tablet Take 1 tablet by mouth 2 (two) times daily. 05/13/16  Yes [provider]  losartan (COZAAR) 50 MG tablet Take 25 mg by mouth daily.   Yes [provider]  Multiple Vitamin (MULITIVITAMIN WITH MINERALS) TABS Take 1 tablet by mouth daily.   Yes [provider]  nystatin (MYCOSTATIN/NYSTOP)  powder Apply topically 3 (three) times daily. 07/02/17  Yes Zadie Rhine, MD  HYDROcodone-acetaminophen (NORCO/VICODIN) 5-325 MG tablet Take 1 tablet by mouth every 6 (six) hours as needed for severe pain. Patient not taking: Reported on 07/27/2017 07/02/17   Zadie Rhine, MD    Family History No family history on file.  Social History Social History  Substance Use Topics  . Smoking status: Never Smoker  . Smokeless tobacco: Never Used  . Alcohol use No     Allergies   Contrast media [iodinated diagnostic agents]; Morphine and related; Other; Penicillins; and Codeine   Review of Systems Review of Systems  Constitutional: Positive for diaphoresis. Negative for fever.  HENT: Positive for congestion.   Eyes: Negative for redness.  Respiratory: Negative for shortness of breath.   Cardiovascular: Positive for chest pain.  Gastrointestinal: Positive for abdominal pain, nausea and vomiting. Negative for diarrhea.  Genitourinary: Positive for dysuria.  Musculoskeletal: Positive for back pain.  Skin: Negative for rash.  Neurological: Negative for syncope and headaches.  Hematological: Does not bruise/bleed easily.  Psychiatric/Behavioral: Negative for confusion.     Physical Exam Updated Vital Signs BP 118/69   Pulse 81   Temp 98.2 F (36.8 C) (Oral)   Resp 18   Ht 1.524 m (5')   Wt 63.5 kg (140 lb)   SpO2 93%   BMI 27.34 kg/m   Physical Exam  Constitutional: She is oriented to person, place, and time. She appears well-developed and well-nourished. She appears distressed.  HENT:  Head: Normocephalic and atraumatic.  Mouth/Throat: Oropharynx is clear and moist.  Eyes: Pupils are equal, round, and reactive to light. Conjunctivae and EOM are normal.  Neck: Normal range of motion. Neck supple.  Cardiovascular: Normal rate and regular rhythm.   Pulmonary/Chest: Effort normal and breath sounds normal.  Abdominal: Soft. Bowel sounds are normal. She exhibits distension  and mass. A hernia is present.  large ventral hernia predominantly left lower quadrant area. No erythema. Mild tenderness no significant tenderness.although not able to ensure that it's completely reduced. Do not feel that it is incarcerated at this time.  Musculoskeletal: Normal range of motion.  Neurological: She is alert and oriented to person, place, and time. No cranial nerve deficit or sensory deficit. She exhibits normal muscle tone. Coordination normal.  Skin: Skin is warm.  Nursing note and vitals reviewed.    ED Treatments / Results  Labs (all labs ordered are listed, but only abnormal results are displayed) Labs Reviewed  COMPREHENSIVE METABOLIC PANEL - Abnormal; Notable for the following:       Result Value   Glucose, Bld 258 (*)    BUN 22 (*)    Creatinine, Ser 1.22 (*)    GFR calc non Af Amer 42 (*)    GFR calc Af Amer 49 (*)    All other components within normal limits  CBC - Abnormal; Notable for the following:    WBC 14.9 (*)    All other components within normal limits  URINALYSIS, ROUTINE W REFLEX MICROSCOPIC - Abnormal; Notable for the following:    APPearance HAZY (*)    Glucose, UA >=500 (*)    Ketones, ur 20 (*)    Protein, ur 100 (*)    Leukocytes, UA TRACE (*)    Bacteria, UA RARE (*)    Squamous Epithelial / LPF 0-5 (*)    All other components within normal limits  LIPASE, BLOOD  TROPONIN I    EKG  EKG Interpretation  Date/Time:  Monday July 27 2017 19:36:39 EDT Ventricular Rate:  38 PR Interval:    QRS Duration: 115 QT Interval:  435 QTC Calculation: 346 R Axis:   88 Text Interpretation:  Junctional rhythm Nonspecific intraventricular conduction delay Low voltage, precordial leads Nonspecific repol abnormality, diffuse leads rhythm changed since earlier in day, present on june 2017 Confirmed by Marily MemosMesner, Jason [phone]([id number]) on 07/27/2017 7:38:59 PM       Radiology Dg Chest 2 View  Result Date: 07/27/2017 CLINICAL DATA:  Mid abdominal and mid  chest pain. Cough today. Question pneumonia. EXAM: CHEST  2 VIEW COMPARISON:  Portable AP view earlier this day demonstrating retrocardiac opacity. FINDINGS: Left retrocardiac opacity on prior exam is consistent with atelectasis or scarring with mild elevation of left hemidiaphragm. No evidence of pneumonia. The lungs are otherwise clear. There is atherosclerosis of the thoracic aorta. Heart is normal in size. No pulmonary edema or pleural fluid. No pneumothorax. No acute osseous abnormalities are seen. IMPRESSION: Left retrocardiac opacity consistent with atelectasis or scarring. No evidence of pneumonia. Electronically Signed   By: Rubye OaksMelanie  Ehinger M.D.   On: 07/27/2017 22:11   Dg Chest Portable 1 View  Result Date: 07/27/2017 CLINICAL DATA:  Chest pain EXAM: PORTABLE CHEST 1 VIEW COMPARISON:  06/11/2016 FINDINGS: The lungs are clear without focal pneumonia, edema, right lung is unremarkable. There is retrocardiac opacity which may be related atelectasis  or pneumonia. Cardiopericardial silhouette is at upper limits of normal for size. The visualized bony structures of the thorax are intact. Telemetry leads overlie the chest. IMPRESSION: Retrocardiac opacity compatible with atelectasis or pneumonia. Dedicated upright PA and lateral views of the chest when the patient is able may prove helpful to further evaluate. Electronically Signed   By: Kennith Center M.D.   On: 07/27/2017 20:21   Results for orders placed or performed during the hospital encounter of 07/27/17  Lipase, blood  Result Value Ref Range   Lipase 43 11 - 51 U/L  Comprehensive metabolic panel  Result Value Ref Range   Sodium 142 135 - 145 mmol/L   Potassium 4.6 3.5 - 5.1 mmol/L   Chloride 103 101 - 111 mmol/L   CO2 26 22 - 32 mmol/L   Glucose, Bld 258 (H) 65 - 99 mg/dL   BUN 22 (H) 6 - 20 mg/dL   Creatinine, Ser 1.61 (H) 0.44 - 1.00 mg/dL   Calcium 9.4 8.9 - 09.6 mg/dL   Total Protein 7.9 6.5 - 8.1 g/dL   Albumin 4.1 3.5 - 5.0 g/dL    AST 23 15 - 41 U/L   ALT 22 14 - 54 U/L   Alkaline Phosphatase 50 38 - 126 U/L   Total Bilirubin 0.5 0.3 - 1.2 mg/dL   GFR calc non Af Amer 42 (L) >60 mL/min   GFR calc Af Amer 49 (L) >60 mL/min   Anion gap 13 5 - 15  CBC  Result Value Ref Range   WBC 14.9 (H) 4.0 - 10.5 K/uL   RBC 4.13 3.87 - 5.11 MIL/uL   Hemoglobin 12.8 12.0 - 15.0 g/dL   HCT 04.5 40.9 - 81.1 %   MCV 91.3 78.0 - 100.0 fL   MCH 31.0 26.0 - 34.0 pg   MCHC 34.0 30.0 - 36.0 g/dL   RDW 91.4 78.2 - 95.6 %   Platelets 291 150 - 400 K/uL  Urinalysis, Routine w reflex microscopic  Result Value Ref Range   Color, Urine YELLOW YELLOW   APPearance HAZY (A) CLEAR   Specific Gravity, Urine 1.023 1.005 - 1.030   pH 5.0 5.0 - 8.0   Glucose, UA >=500 (A) NEGATIVE mg/dL   Hgb urine dipstick NEGATIVE NEGATIVE   Bilirubin Urine NEGATIVE NEGATIVE   Ketones, ur 20 (A) NEGATIVE mg/dL   Protein, ur 213 (A) NEGATIVE mg/dL   Nitrite NEGATIVE NEGATIVE   Leukocytes, UA TRACE (A) NEGATIVE   RBC / HPF 0-5 0 - 5 RBC/hpf   WBC, UA 6-30 0 - 5 WBC/hpf   Bacteria, UA RARE (A) NONE SEEN   Squamous Epithelial / LPF 0-5 (A) NONE SEEN   Mucous PRESENT   Troponin I  Result Value Ref Range   Troponin I <0.03 <0.03 ng/mL   Ct Abdomen Pelvis Wo Contrast  Result Date: 07/02/2017 CLINICAL DATA:  76 year old female with abdominal pain. EXAM: CT ABDOMEN AND PELVIS WITHOUT CONTRAST TECHNIQUE: Multidetector CT imaging of the abdomen and pelvis was performed following the standard protocol without IV contrast. COMPARISON:  Abdominal CT dated 06/11/2016 FINDINGS: Evaluation of this exam is limited in the absence of intravenous contrast. Lower chest: The visualized lung bases are clear. There is coronary vascular calcification. No intra-abdominal free air or free fluid. Hepatobiliary: No focal liver abnormality is seen. No gallstones, gallbladder wall thickening, or biliary dilatation. Pancreas: Unremarkable. No pancreatic ductal dilatation or  surrounding inflammatory changes. Spleen: Normal in size without focal abnormality. Adrenals/Urinary Tract:  The adrenal glands are unremarkable. There is a left nephrectomy. The right kidney is unremarkable. The visualized right ureter and urinary bladder appear unremarkable. Stomach/Bowel: Oral contrast opacifies the stomach and multiple loops of small bowel. The stomach is distended. No evidence of gastric outlet obstruction. There is a 3.3 cm distal duodenal diverticulum without active inflammation. There is a large complex left anterolateral abdominal wall hernia containing multiple loops of small bowel as well as a segment of the sigmoid colon. There is no evidence of obstruction of the bowel in the hernia sac. Mild stranding of the mesentery in the hernia without free fluid. The appearance of the hernia is similar to prior CT. There is large amount of stool throughout the colon. There are scattered colonic diverticula without active inflammatory changes. There is no evidence of bowel obstruction or active inflammation. Normal appendix. Vascular/Lymphatic: There is advanced aortoiliac atherosclerotic disease. Evaluation of the vasculature is limited on this noncontrast CT. No portal venous gas identified. There is no adenopathy. Reproductive: The uterus and ovaries are grossly unremarkable. No pelvic mass. Other: Complex left anterolateral hernia containing loops of small bowel and segment of sigmoid colon with probable mild edema. These findings are similar to the prior CT. Musculoskeletal: Degenerative changes of the spine. No acute osseous pathology. IMPRESSION: 1. Large complex left anterolateral pelvic wall hernia containing loops of small bowel and a segment of sigmoid colon. The appearance of the hernia is similar to prior CT. No evidence of obstruction. 2. Scattered colonic diverticula as well as a distal duodenal diverticulum without active inflammatory changes. 3. Moderate colonic stool burden. No  bowel obstruction or active inflammation. Normal appendix. 4. Status post prior left nephrectomy. No hydronephrosis on the right. 5.  Aortic Atherosclerosis (ICD10-I70.0). Electronically Signed   By: Elgie Collard M.D.   On: 07/02/2017 01:00   Dg Chest 2 View  Result Date: 07/27/2017 CLINICAL DATA:  Mid abdominal and mid chest pain. Cough today. Question pneumonia. EXAM: CHEST  2 VIEW COMPARISON:  Portable AP view earlier this day demonstrating retrocardiac opacity. FINDINGS: Left retrocardiac opacity on prior exam is consistent with atelectasis or scarring with mild elevation of left hemidiaphragm. No evidence of pneumonia. The lungs are otherwise clear. There is atherosclerosis of the thoracic aorta. Heart is normal in size. No pulmonary edema or pleural fluid. No pneumothorax. No acute osseous abnormalities are seen. IMPRESSION: Left retrocardiac opacity consistent with atelectasis or scarring. No evidence of pneumonia. Electronically Signed   By: Rubye Oaks M.D.   On: 07/27/2017 22:11   Dg Chest Portable 1 View  Result Date: 07/27/2017 CLINICAL DATA:  Chest pain EXAM: PORTABLE CHEST 1 VIEW COMPARISON:  06/11/2016 FINDINGS: The lungs are clear without focal pneumonia, edema, right lung is unremarkable. There is retrocardiac opacity which may be related atelectasis or pneumonia. Cardiopericardial silhouette is at upper limits of normal for size. The visualized bony structures of the thorax are intact. Telemetry leads overlie the chest. IMPRESSION: Retrocardiac opacity compatible with atelectasis or pneumonia. Dedicated upright PA and lateral views of the chest when the patient is able may prove helpful to further evaluate. Electronically Signed   By: Kennith Center M.D.   On: 07/27/2017 20:21    Procedures Procedures (including critical care time)  Medications Ordered in ED Medications  fentaNYL (SUBLIMAZE) injection 25 mcg (not administered)  ondansetron (ZOFRAN) injection 4 mg (4 mg  Intravenous Given 07/27/17 1955)     Initial Impression / Assessment and Plan / ED Course  I have reviewed  the triage vital signs and the nursing notes.  Pertinent labs & imaging results that were available during my care of the patient were reviewed by me and considered in my medical decision making (see chart for details).    Patient presents today with significant abdominal pain. Patient known to have a large left-sided the ventral hernia. Patient states the pain is fair radiates to her left low back and across to her epigastric area. Some slight discomfort in the chest area. Symptoms have been present for the past 2 days but have gotten worse just today.  Patient was seen on July 19 with similar complaints had CT of the abdomen which showed no obstruction or incarceration or strangulation.  Today's workup started with portable chest which raise some concerns for pneumonia thought maybe that's why she was feeling bad clinically I felt as if the ventral hernia was stable and baseline. Was no redness no significant tenderness to palpation.  Repeat chest x-ray PA and lateral showed no evidence of pneumonia. So have ordered a CT scan of the abdomen with oral contrast only. Patient has IV dye contrast contrast allergy.  Patient will be turned over to the overnight emergency physician to check CT scan. If negative for any acute process patient can follow-up with Dr. Juanetta Gosling.  Patient's labs here today without any significant abnormalities there is a slight leukocytosis. Patient has had that frequently. Patient also has some hyperglycemia with blood sugars around 258 but no evidence of any acidosis. Urinalysis negative for any urinary tract infection.  Final Clinical Impressions(s) / ED Diagnoses   Final diagnoses:  Generalized abdominal pain  Ventral hernia without obstruction or gangrene    New Prescriptions New Prescriptions   No medications on file     Vanetta Mulders, MD 07/28/17  [phone]

## 2017-07-28 NOTE — Discharge Instructions (Signed)
Follow-up with Dr. Juanetta GoslingHawkins make an appointment. Return for any new or worse symptoms.

## 2017-07-28 NOTE — ED Notes (Signed)
Pt wheeled to waiting room. Pt verbalized understanding of discharge instructions.   

## 2017-07-28 NOTE — ED Provider Notes (Addendum)
Patient signed out pending CT scan. CT scan shows no evidence of obstruction or strangulation. Large ventral wall hernia. Symptoms appear acute on chronic. Follow-up with primary physician as directed area discharge instructions per Dr. Jodelle GrossZakowski.  2:47 AM Discussed plan with family. Previously referred to general surgery. They prefer to go through Dr. Juanetta GoslingHawkins for referrals. They're requesting pain medication at discharge. [place] controlled substance database reviewed without any active prescriptions. Patient given a short course of pain medication. Follow-up with primary physician for any further needs.   Shon BatonHorton, Jaedan Schuman F, MD 07/28/17 [phone]    Shon BatonHorton, Alissia Lory F, MD 07/28/17 [phone]

## 2017-08-03 ENCOUNTER — Other Ambulatory Visit (HOSPITAL_BASED_OUTPATIENT_CLINIC_OR_DEPARTMENT_OTHER): Payer: Self-pay

## 2017-08-03 DIAGNOSIS — G4710 Hypersomnia, unspecified: Secondary | ICD-10-CM

## 2017-08-03 DIAGNOSIS — G4730 Sleep apnea, unspecified: Secondary | ICD-10-CM

## 2017-08-03 DIAGNOSIS — R0683 Snoring: Secondary | ICD-10-CM

## 2017-08-26 ENCOUNTER — Encounter (HOSPITAL_COMMUNITY): Payer: Self-pay | Admitting: Emergency Medicine

## 2017-08-26 ENCOUNTER — Emergency Department (HOSPITAL_COMMUNITY)
Admission: EM | Admit: 2017-08-26 | Discharge: 2017-08-26 | Disposition: A | Discharge status: Home / Self Care | Payer: Medicare Other | Attending: Emergency Medicine | Admitting: Emergency Medicine

## 2017-08-26 ENCOUNTER — Emergency Department (HOSPITAL_COMMUNITY): Payer: Medicare Other

## 2017-08-26 DIAGNOSIS — Z7984 Long term (current) use of oral hypoglycemic drugs: Secondary | ICD-10-CM | POA: Diagnosis not present

## 2017-08-26 DIAGNOSIS — Z7982 Long term (current) use of aspirin: Secondary | ICD-10-CM | POA: Diagnosis not present

## 2017-08-26 DIAGNOSIS — Z79899 Other long term (current) drug therapy: Secondary | ICD-10-CM | POA: Diagnosis not present

## 2017-08-26 DIAGNOSIS — I10 Essential (primary) hypertension: Secondary | ICD-10-CM | POA: Insufficient documentation

## 2017-08-26 DIAGNOSIS — R1084 Generalized abdominal pain: Secondary | ICD-10-CM | POA: Diagnosis present

## 2017-08-26 DIAGNOSIS — E119 Type 2 diabetes mellitus without complications: Secondary | ICD-10-CM | POA: Diagnosis not present

## 2017-08-26 DIAGNOSIS — K439 Ventral hernia without obstruction or gangrene: Secondary | ICD-10-CM | POA: Diagnosis not present

## 2017-08-26 DIAGNOSIS — R1031 Right lower quadrant pain: Secondary | ICD-10-CM | POA: Diagnosis not present

## 2017-08-26 LAB — COMPREHENSIVE METABOLIC PANEL
ALT: 18 U/L (ref 14–54)
ANION GAP: 10 (ref 5–15)
AST: 21 U/L (ref 15–41)
Albumin: 3.7 g/dL (ref 3.5–5.0)
Alkaline Phosphatase: 45 U/L (ref 38–126)
BUN: 15 mg/dL (ref 6–20)
CO2: 27 mmol/L (ref 22–32)
Calcium: 8.9 mg/dL (ref 8.9–10.3)
Chloride: 102 mmol/L (ref 101–111)
Creatinine, Ser: 1.1 mg/dL — ABNORMAL HIGH (ref 0.44–1.00)
GFR calc Af Amer: 55 mL/min — ABNORMAL LOW (ref >60)
GFR, EST NON AFRICAN AMERICAN: 48 mL/min — AB (ref >60)
Glucose, Bld: 115 mg/dL — ABNORMAL HIGH (ref 65–99)
POTASSIUM: 4.3 mmol/L (ref 3.5–5.1)
Sodium: 139 mmol/L (ref 135–145)
TOTAL PROTEIN: 7.1 g/dL (ref 6.5–8.1)
Total Bilirubin: 0.3 mg/dL (ref 0.3–1.2)

## 2017-08-26 LAB — CBC
HEMATOCRIT: 34.7 % — AB (ref 36.0–46.0)
HEMOGLOBIN: 12 g/dL (ref 12.0–15.0)
MCH: 31.8 pg (ref 26.0–34.0)
MCHC: 34.6 g/dL (ref 30.0–36.0)
MCV: 92 fL (ref 78.0–100.0)
Platelets: 266 10*3/uL (ref 150–400)
RBC: 3.77 MIL/uL — ABNORMAL LOW (ref 3.87–5.11)
RDW: 12.7 % (ref 11.5–15.5)
WBC: 11.5 10*3/uL — AB (ref 4.0–10.5)

## 2017-08-26 LAB — URINALYSIS, ROUTINE W REFLEX MICROSCOPIC
BACTERIA UA: NONE SEEN
Glucose, UA: NEGATIVE mg/dL
Hgb urine dipstick: NEGATIVE
KETONES UR: 5 mg/dL — AB
Nitrite: NEGATIVE
PH: 5 (ref 5.0–8.0)
PROTEIN: 100 mg/dL — AB
Specific Gravity, Urine: 1.029 (ref 1.005–1.030)

## 2017-08-26 LAB — LIPASE, BLOOD: Lipase: 39 U/L (ref 11–51)

## 2017-08-26 MED ORDER — ONDANSETRON HCL 4 MG/2ML IJ SOLN
4.0000 mg | Freq: Once | INTRAMUSCULAR | Status: AC
  Administered 2017-08-26: 4 mg via INTRAVENOUS
  Filled 2017-08-26: qty 2

## 2017-08-26 MED ORDER — HYDROMORPHONE HCL 1 MG/ML IJ SOLN
0.5000 mg | Freq: Once | INTRAMUSCULAR | Status: AC
  Administered 2017-08-26: 0.5 mg via INTRAVENOUS
  Filled 2017-08-26: qty 1

## 2017-08-26 NOTE — Discharge Instructions (Signed)
Follow-up with your primary doctor and consider seeing a surgeon in consultation, return as needed for worsening symptoms

## 2017-08-26 NOTE — ED Provider Notes (Signed)
[facility] DEPT Provider Note   CSN: [id number] Arrival date & time: 08/26/17  1826   History   Chief Complaint Chief Complaint  Patient presents with  . Abdominal Pain    HPI [name] [name] is a 76 y.o. female.  HPI Pt presents to the ED for recurrent abdominal pain.  Pt has a history of ventral hernia after complications from renal surgery in the 70s.  Pt has had intermittent trouble with abdominal pain over the years.   She started having pain this evening.  The pain was sharp and moved to both sides of her abdomen and toward her chest.  The pain has decreased somewhat. She denies any vomiting.  No fever.  No dysuria. Past Medical History:  Diagnosis Date  . Diabetes mellitus   . Hypertension   . Renal disorder     Patient Active Problem List   Diagnosis Date Noted  . Chest pain 06/11/2016  . DIABETES MELLITUS 06/13/2009  . GERD 06/13/2009    Past Surgical History:  Procedure Laterality Date  . NEPHRECTOMY      OB History    No data available       Home Medications    Prior to Admission medications   Medication Sig Start Date End Date Taking? Authorizing Provider  aspirin 81 MG tablet Take 81 mg by mouth every evening.    Yes [provider]  fish oil-omega-3 fatty acids 1000 MG capsule Take 1 g by mouth 2 (two) times daily.   Yes [provider]  JANUMET 50-1000 MG tablet Take 1 tablet by mouth 2 (two) times daily. 05/13/16  Yes [provider]  ketoconazole (NIZORAL) 2 % cream Apply 1 application topically daily.   Yes [provider]  losartan (COZAAR) 50 MG tablet Take 25 mg by mouth daily.   Yes [provider]  Multiple Vitamin (MULITIVITAMIN WITH MINERALS) TABS Take 1 tablet by mouth daily.   Yes [provider]  nystatin (MYCOSTATIN/NYSTOP) powder Apply topically 3 (three) times daily. 07/02/17  Yes Zadie Rhine, MD  HYDROcodone-acetaminophen (NORCO/VICODIN) 5-325 MG tablet Take 1 tablet by  mouth every 6 (six) hours as needed for severe pain. Patient not taking: Reported on 08/26/2017 07/28/17   Horton, Mayer Masker, MD    Family History History reviewed. No pertinent family history.  Social History Social History  Substance Use Topics  . Smoking status: Never Smoker  . Smokeless tobacco: Never Used  . Alcohol use No     Allergies   Contrast media [iodinated diagnostic agents]; Morphine and related; Other; Penicillins; and Codeine   Review of Systems Review of Systems  All other systems reviewed and are negative.    Physical Exam Updated Vital Signs BP (!) 116/56 (BP Location: Right Arm)   Pulse 67   Temp 98.2 F (36.8 C) (Oral)   Resp 18   Ht 1.524 m (5')   Wt 63.5 kg (140 lb)   SpO2 97%   BMI 27.34 kg/m   Physical Exam  Constitutional: She appears well-developed and well-nourished. No distress.  HENT:  Head: Normocephalic and atraumatic.  Right Ear: External ear normal.  Left Ear: External ear normal.  Eyes: Conjunctivae are normal. Right eye exhibits no discharge. Left eye exhibits no discharge. No scleral icterus.  Neck: Neck supple. No tracheal deviation present.  Cardiovascular: Normal rate, regular rhythm and intact distal pulses.   Pulmonary/Chest: Effort normal and breath sounds normal. No stridor. No respiratory distress. She has no wheezes. She  has no rales.  Abdominal: Soft. Bowel sounds are normal. She exhibits mass. She exhibits no distension. There is no tenderness. There is no rebound and no guarding. A hernia is present. Hernia confirmed positive in the ventral area.  Large ventral hernia, abdomen is soft, no guarding, no erythema  Musculoskeletal: She exhibits no edema or tenderness.  Neurological: She is alert. She has normal strength. No cranial nerve deficit (no facial droop, extraocular movements intact, no slurred speech) or sensory deficit. She exhibits normal muscle tone. She displays no seizure activity. Coordination normal.    Skin: Skin is warm and dry. No rash noted.  Psychiatric: She has a normal mood and affect.  Nursing note and vitals reviewed.    ED Treatments / Results  Labs (all labs ordered are listed, but only abnormal results are displayed) Labs Reviewed  COMPREHENSIVE METABOLIC PANEL - Abnormal; Notable for the following:       Result Value   Glucose, Bld 115 (*)    Creatinine, Ser 1.10 (*)    GFR calc non Af Amer 48 (*)    GFR calc Af Amer 55 (*)    All other components within normal limits  CBC - Abnormal; Notable for the following:    WBC 11.5 (*)    RBC 3.77 (*)    HCT 34.7 (*)    All other components within normal limits  URINALYSIS, ROUTINE W REFLEX MICROSCOPIC - Abnormal; Notable for the following:    Color, Urine AMBER (*)    APPearance HAZY (*)    Bilirubin Urine SMALL (*)    Ketones, ur 5 (*)    Protein, ur 100 (*)    Leukocytes, UA MODERATE (*)    Squamous Epithelial / LPF 0-5 (*)    All other components within normal limits  LIPASE, BLOOD    EKG  EKG Interpretation  Date/Time:  Wednesday August 26 2017 18:36:58 EDT Ventricular Rate:  77 PR Interval:  152 QRS Duration: 82 QT Interval:  372 QTC Calculation: 420 R Axis:   67 Text Interpretation:  Normal sinus rhythm Low voltage QRS Nonspecific ST and T wave abnormality Abnormal ECG sinus rhythm has replaced junctional rhythm compared to last tracing Confirmed by Linwood DibblesKnapp, Aasiyah Auerbach [phone]([id number]) on 08/26/2017 6:44:53 PM       Radiology Dg Abdomen Acute W/chest  Result Date: 08/26/2017 CLINICAL DATA:  Left upper quadrant pain radiating to the chest and right abdomen. Vomiting. EXAM: DG ABDOMEN ACUTE W/ 1V CHEST COMPARISON:  07/28/2017 CT FINDINGS: Small and large bowel loops are seen within the expected location of the left lateral abdominopelvic hernia previously identified on recent CT. No bowel obstruction is noted. Small rounded density in the left hemiabdomen may reflect contrast within a diverticulum. There is  thoracolumbar spondylosis. No acute osseous abnormality. Heart size is normal. There is aortic atherosclerosis at the arch. Minimal atelectasis is seen at the left lung base. No overt pulmonary edema, effusion or pneumothorax. IMPRESSION: 1. Small and large bowel loops are lateralized to the left, some of which are likely to be within the patient's pannus and in the expected location of the known left lateral hernia. 2. There is no bowel obstruction or free air. 3. No acute cardiopulmonary disease. Electronically Signed   By: Tollie Ethavid  Kwon M.D.   On: 08/26/2017 22:01    Procedures Procedures (including critical care time)  Medications Ordered in ED Medications  HYDROmorphone (DILAUDID) injection 0.5 mg (0.5 mg Intravenous Given 08/26/17 2150)  ondansetron (ZOFRAN) injection 4 mg (  4 mg Intravenous Given 08/26/17 2150)     Initial Impression / Assessment and Plan / ED Course  I have reviewed the triage vital signs and the nursing notes.  Pertinent labs & imaging results that were available during my care of the patient were reviewed by me and considered in my medical decision making (see chart for details).   patient has a large ventral hernia on exam.  The abdomen is soft. Plain films show no evidence of obstruction.  I reviewed her previous CT scans over the last couple of months.  eachTime they showed her large complicated ventral hernias without any signs of obstruction.  Patient is comfortable in the ED. She's not having any vomiting. I suspect her symptoms were related to her hernia but there is no indication for any acute obstruction or incarceration. We discussed outpatient follow-up  Final Clinical Impressions(s) / ED Diagnoses   Final diagnoses:  Ventral hernia without obstruction or gangrene    New Prescriptions New Prescriptions   No medications on file     Linwood Dibbles, MD 08/26/17 2318

## 2017-08-26 NOTE — ED Triage Notes (Signed)
Pt reports LUQ pain that radiates to chest and right side of abdomen. Pt reports history of same and reports "ive had this hernia since 1975."

## 2017-09-01 ENCOUNTER — Observation Stay (HOSPITAL_COMMUNITY)
Admission: EM | Admit: 2017-09-01 | Discharge: 2017-09-02 | Disposition: A | Discharge status: Home / Self Care | Payer: Medicare Other | Attending: General Surgery | Admitting: General Surgery

## 2017-09-01 ENCOUNTER — Encounter (HOSPITAL_COMMUNITY): Payer: Self-pay | Admitting: Cardiology

## 2017-09-01 ENCOUNTER — Emergency Department (HOSPITAL_COMMUNITY): Payer: Medicare Other

## 2017-09-01 DIAGNOSIS — K219 Gastro-esophageal reflux disease without esophagitis: Secondary | ICD-10-CM | POA: Insufficient documentation

## 2017-09-01 DIAGNOSIS — Z880 Allergy status to penicillin: Secondary | ICD-10-CM | POA: Diagnosis not present

## 2017-09-01 DIAGNOSIS — R140 Abdominal distension (gaseous): Secondary | ICD-10-CM | POA: Diagnosis not present

## 2017-09-01 DIAGNOSIS — K439 Ventral hernia without obstruction or gangrene: Secondary | ICD-10-CM | POA: Diagnosis not present

## 2017-09-01 DIAGNOSIS — Z888 Allergy status to other drugs, medicaments and biological substances status: Secondary | ICD-10-CM | POA: Insufficient documentation

## 2017-09-01 DIAGNOSIS — Z91041 Radiographic dye allergy status: Secondary | ICD-10-CM | POA: Insufficient documentation

## 2017-09-01 DIAGNOSIS — Z7982 Long term (current) use of aspirin: Secondary | ICD-10-CM | POA: Diagnosis not present

## 2017-09-01 DIAGNOSIS — I10 Essential (primary) hypertension: Secondary | ICD-10-CM | POA: Insufficient documentation

## 2017-09-01 DIAGNOSIS — K460 Unspecified abdominal hernia with obstruction, without gangrene: Secondary | ICD-10-CM

## 2017-09-01 DIAGNOSIS — R279 Unspecified lack of coordination: Secondary | ICD-10-CM | POA: Diagnosis not present

## 2017-09-01 DIAGNOSIS — I700 Atherosclerosis of aorta: Secondary | ICD-10-CM | POA: Diagnosis not present

## 2017-09-01 DIAGNOSIS — E119 Type 2 diabetes mellitus without complications: Secondary | ICD-10-CM | POA: Insufficient documentation

## 2017-09-01 DIAGNOSIS — Z885 Allergy status to narcotic agent status: Secondary | ICD-10-CM | POA: Diagnosis not present

## 2017-09-01 DIAGNOSIS — R1012 Left upper quadrant pain: Secondary | ICD-10-CM | POA: Diagnosis not present

## 2017-09-01 DIAGNOSIS — K436 Other and unspecified ventral hernia with obstruction, without gangrene: Secondary | ICD-10-CM | POA: Diagnosis not present

## 2017-09-01 DIAGNOSIS — Z79899 Other long term (current) drug therapy: Secondary | ICD-10-CM | POA: Insufficient documentation

## 2017-09-01 DIAGNOSIS — Z905 Acquired absence of kidney: Secondary | ICD-10-CM | POA: Insufficient documentation

## 2017-09-01 DIAGNOSIS — G8929 Other chronic pain: Secondary | ICD-10-CM | POA: Diagnosis not present

## 2017-09-01 DIAGNOSIS — Z743 Need for continuous supervision: Secondary | ICD-10-CM | POA: Diagnosis not present

## 2017-09-01 HISTORY — PX: VENTRAL HERNIA REPAIR: SHX424

## 2017-09-01 LAB — COMPREHENSIVE METABOLIC PANEL
ALBUMIN: 3.8 g/dL (ref 3.5–5.0)
ALK PHOS: 45 U/L (ref 38–126)
ALT: 20 U/L (ref 14–54)
ANION GAP: 9 (ref 5–15)
AST: 22 U/L (ref 15–41)
BILIRUBIN TOTAL: 0.4 mg/dL (ref 0.3–1.2)
BUN: 21 mg/dL — ABNORMAL HIGH (ref 6–20)
CALCIUM: 9 mg/dL (ref 8.9–10.3)
CO2: 26 mmol/L (ref 22–32)
Chloride: 101 mmol/L (ref 101–111)
Creatinine, Ser: 1.26 mg/dL — ABNORMAL HIGH (ref 0.44–1.00)
GFR calc Af Amer: 47 mL/min — ABNORMAL LOW (ref >60)
GFR calc non Af Amer: 41 mL/min — ABNORMAL LOW (ref >60)
Glucose, Bld: 198 mg/dL — ABNORMAL HIGH (ref 65–99)
POTASSIUM: 5.2 mmol/L — AB (ref 3.5–5.1)
SODIUM: 136 mmol/L (ref 135–145)
Total Protein: 7.4 g/dL (ref 6.5–8.1)

## 2017-09-01 LAB — CBC WITH DIFFERENTIAL/PLATELET
BASOS ABS: 0 10*3/uL (ref 0.0–0.1)
BASOS PCT: 0 %
Eosinophils Absolute: 0.2 10*3/uL (ref 0.0–0.7)
Eosinophils Relative: 2 %
HEMATOCRIT: 35.7 % — AB (ref 36.0–46.0)
HEMOGLOBIN: 12.1 g/dL (ref 12.0–15.0)
Lymphocytes Relative: 20 %
Lymphs Abs: 1.7 10*3/uL (ref 0.7–4.0)
MCH: 31.3 pg (ref 26.0–34.0)
MCHC: 33.9 g/dL (ref 30.0–36.0)
MCV: 92.5 fL (ref 78.0–100.0)
MONOS PCT: 6 %
Monocytes Absolute: 0.5 10*3/uL (ref 0.1–1.0)
NEUTROS ABS: 5.9 10*3/uL (ref 1.7–7.7)
Neutrophils Relative %: 72 %
Platelets: 236 10*3/uL (ref 150–400)
RBC: 3.86 MIL/uL — AB (ref 3.87–5.11)
RDW: 12.7 % (ref 11.5–15.5)
WBC: 8.3 10*3/uL (ref 4.0–10.5)

## 2017-09-01 LAB — URINALYSIS, ROUTINE W REFLEX MICROSCOPIC
BILIRUBIN URINE: NEGATIVE
GLUCOSE, UA: NEGATIVE mg/dL
HGB URINE DIPSTICK: NEGATIVE
Ketones, ur: 5 mg/dL — AB
NITRITE: NEGATIVE
Protein, ur: 30 mg/dL — AB
SPECIFIC GRAVITY, URINE: 1.017 (ref 1.005–1.030)
pH: 5 (ref 5.0–8.0)

## 2017-09-01 LAB — I-STAT CG4 LACTIC ACID, ED
Lactic Acid, Venous: 2.92 mmol/L (ref 0.5–1.9)
Lactic Acid, Venous: 2.47 mmol/L (ref 0.5–1.9)

## 2017-09-01 MED ORDER — SODIUM CHLORIDE 0.9 % IV BOLUS (SEPSIS)
500.0000 mL | Freq: Once | INTRAVENOUS | Status: AC
  Administered 2017-09-01: 500 mL via INTRAVENOUS

## 2017-09-01 MED ORDER — ACETAMINOPHEN 325 MG PO TABS: 650.0000 mg | ORAL_TABLET | Freq: Four times a day (QID) | ORAL | Status: DC | PRN

## 2017-09-01 MED ORDER — HYDROMORPHONE HCL 1 MG/ML IJ SOLN
0.5000 mg | Freq: Once | INTRAMUSCULAR | Status: AC
  Administered 2017-09-01: 0.5 mg via INTRAVENOUS
  Filled 2017-09-01: qty 1

## 2017-09-01 MED ORDER — ACETAMINOPHEN 650 MG RE SUPP
650.0000 mg | Freq: Four times a day (QID) | RECTAL | Status: DC | PRN
Start: 2017-09-01 — End: 2017-09-02

## 2017-09-01 MED ORDER — METHOCARBAMOL 500 MG PO TABS: 500.0000 mg | ORAL_TABLET | Freq: Four times a day (QID) | ORAL | Status: DC | PRN

## 2017-09-01 MED ORDER — LOSARTAN POTASSIUM 50 MG PO TABS
25.0000 mg | ORAL_TABLET | Freq: Every day | ORAL | Status: DC
  Administered 2017-09-02: 25 mg via ORAL
  Filled 2017-09-01: qty 1

## 2017-09-01 MED ORDER — ONDANSETRON HCL 4 MG/2ML IJ SOLN
4.0000 mg | Freq: Four times a day (QID) | INTRAMUSCULAR | Status: DC | PRN
Start: 2017-09-01 — End: 2017-09-02

## 2017-09-01 MED ORDER — INSULIN ASPART 100 UNIT/ML ~~LOC~~ SOLN: 0.0000 [IU] | Freq: Every day | SUBCUTANEOUS | Status: DC

## 2017-09-01 MED ORDER — HYDROMORPHONE HCL 1 MG/ML IJ SOLN
1.0000 mg | Freq: Once | INTRAMUSCULAR | Status: DC
  Filled 2017-09-01: qty 1

## 2017-09-01 MED ORDER — OXYCODONE HCL 5 MG PO TABS
5.0000 mg | ORAL_TABLET | ORAL | Status: DC | PRN
Start: 2017-09-01 — End: 2017-09-02
  Administered 2017-09-02: 10 mg via ORAL
  Filled 2017-09-01: qty 2

## 2017-09-01 MED ORDER — SODIUM CHLORIDE 0.9 % IV SOLN
INTRAVENOUS | Status: DC
  Administered 2017-09-02: 01:00:00 via INTRAVENOUS

## 2017-09-01 MED ORDER — ONDANSETRON 4 MG PO TBDP: 4.0000 mg | ORAL_TABLET | Freq: Four times a day (QID) | ORAL | Status: DC | PRN

## 2017-09-01 MED ORDER — INSULIN ASPART 100 UNIT/ML ~~LOC~~ SOLN
0.0000 [IU] | Freq: Three times a day (TID) | SUBCUTANEOUS | Status: DC
  Administered 2017-09-02: 2 [IU] via SUBCUTANEOUS
  Administered 2017-09-02: 3 [IU] via SUBCUTANEOUS

## 2017-09-01 MED ORDER — ENOXAPARIN SODIUM 40 MG/0.4ML ~~LOC~~ SOLN
40.0000 mg | Freq: Every day | SUBCUTANEOUS | Status: DC
  Administered 2017-09-02: 40 mg via SUBCUTANEOUS
  Filled 2017-09-01: qty 0.4

## 2017-09-01 MED ORDER — HYDROMORPHONE HCL 1 MG/ML IJ SOLN: 0.5000 mg | INTRAMUSCULAR | Status: DC | PRN

## 2017-09-01 NOTE — ED Triage Notes (Signed)
Worsening  LUQ pain this morning.  Sent here by Dr. Juanetta Gosling.  Vomited several times this morning.
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
## 2017-09-01 NOTE — ED Provider Notes (Signed)
Please see previous physicians note regarding patient's presenting history and physical, initial ED course, and associated medical decision making.  76 year old female who presents with abdominal pain. She was seen at [facility] earlier, with CT scan suggestive of multiple hernias. Was evaluated by Dr. Henreitta Leber from general surgery who felt that she had concerns for incarcerated hernia, nearing strangulation. She had discussed this with Dr. Janee Morn who accepted her at [facility] for evaluation.  Patient is hemodynamically stable. Tender in the upper abdomen, with some persistent vomiting since then. General surgery consulted and I spoke with Dr. Dwain Sarna. He evaluated patient with plan for admission.    Lavera Guise, MD 09/02/17 [phone]

## 2017-09-01 NOTE — ED Notes (Signed)
Have put pt. On a bedpan to try to obtain urine specimen

## 2017-09-01 NOTE — ED Notes (Signed)
CRITICAL VALUE ALERT  Critical Value:  Lactic acid 2.92  Date & Time Notied:  09/01/17 1222  Provider Notified: Ranae Palms  Orders Received/Actions taken: na

## 2017-09-01 NOTE — ED Notes (Signed)
Called [facility] for transport to [facility] ED.Staff  Stated, "It would be awhile, several calls ahead of [place]".  Called [facility] for transfer after consult with EDP.

## 2017-09-01 NOTE — ED Notes (Signed)
Pt to CT

## 2017-09-01 NOTE — ED Notes (Signed)
Pt arrives by [facility].  Pt is alert and oriented.  IV in RAC.  Pt reports that her pain is less

## 2017-09-01 NOTE — ED Provider Notes (Signed)
4:50 PM patient is resting comfortably at after treatment here she is alert Glasgow Coma Score 15. Patient to be transferred to [facility] emergency department. Dr.Thompson is acceptinng MD after evalaution here by Dr Henreitta Leber from general surgery. Patient agrees with transfer Results for orders placed or performed during the hospital encounter of 09/01/17  CBC with Differential/Platelet  Result Value Ref Range   WBC 8.3 4.0 - 10.5 K/uL   RBC 3.86 (L) 3.87 - 5.11 MIL/uL   Hemoglobin 12.1 12.0 - 15.0 g/dL   HCT 40.3 (L) 47.4 - 25.9 %   MCV 92.5 78.0 - 100.0 fL   MCH 31.3 26.0 - 34.0 pg   MCHC 33.9 30.0 - 36.0 g/dL   RDW 56.3 87.5 - 64.3 %   Platelets 236 150 - 400 K/uL   Neutrophils Relative % 72 %   Neutro Abs 5.9 1.7 - 7.7 K/uL   Lymphocytes Relative 20 %   Lymphs Abs 1.7 0.7 - 4.0 K/uL   Monocytes Relative 6 %   Monocytes Absolute 0.5 0.1 - 1.0 K/uL   Eosinophils Relative 2 %   Eosinophils Absolute 0.2 0.0 - 0.7 K/uL   Basophils Relative 0 %   Basophils Absolute 0.0 0.0 - 0.1 K/uL  Comprehensive metabolic panel  Result Value Ref Range   Sodium 136 135 - 145 mmol/L   Potassium 5.2 (H) 3.5 - 5.1 mmol/L   Chloride 101 101 - 111 mmol/L   CO2 26 22 - 32 mmol/L   Glucose, Bld 198 (H) 65 - 99 mg/dL   BUN 21 (H) 6 - 20 mg/dL   Creatinine, Ser 3.29 (H) 0.44 - 1.00 mg/dL   Calcium 9.0 8.9 - 51.8 mg/dL   Total Protein 7.4 6.5 - 8.1 g/dL   Albumin 3.8 3.5 - 5.0 g/dL   AST 22 15 - 41 U/L   ALT 20 14 - 54 U/L   Alkaline Phosphatase 45 38 - 126 U/L   Total Bilirubin 0.4 0.3 - 1.2 mg/dL   GFR calc non Af Amer 41 (L) >60 mL/min   GFR calc Af Amer 47 (L) >60 mL/min   Anion gap 9 5 - 15  Urinalysis, Routine w reflex microscopic  Result Value Ref Range   Color, Urine YELLOW YELLOW   APPearance HAZY (A) CLEAR   Specific Gravity, Urine 1.017 1.005 - 1.030   pH 5.0 5.0 - 8.0   Glucose, UA NEGATIVE NEGATIVE mg/dL   Hgb urine dipstick NEGATIVE NEGATIVE   Bilirubin Urine NEGATIVE NEGATIVE    Ketones, ur 5 (A) NEGATIVE mg/dL   Protein, ur 30 (A) NEGATIVE mg/dL   Nitrite NEGATIVE NEGATIVE   Leukocytes, UA TRACE (A) NEGATIVE   RBC / HPF 0-5 0 - 5 RBC/hpf   WBC, UA 0-5 0 - 5 WBC/hpf   Bacteria, UA RARE (A) NONE SEEN   Squamous Epithelial / LPF 0-5 (A) NONE SEEN  I-Stat CG4 Lactic Acid, ED  Result Value Ref Range   Lactic Acid, Venous 2.92 (HH) 0.5 - 1.9 mmol/L   Comment NOTIFIED PHYSICIAN   I-Stat CG4 Lactic Acid, ED  Result Value Ref Range   Lactic Acid, Venous 2.47 (HH) 0.5 - 1.9 mmol/L   Ct Abdomen Pelvis Wo Contrast  Result Date: 09/01/2017 CLINICAL DATA:  Left upper quadrant pain. Abdominal distention. Patient states has had swelling of for 44 years, progressive. EXAM: CT ABDOMEN AND PELVIS WITHOUT CONTRAST TECHNIQUE: Multidetector CT imaging of the abdomen and pelvis was performed following the standard protocol  without IV contrast. COMPARISON:  07/28/2017 FINDINGS: Lower chest:  No acute finding.  Mild scarring at the bases. Hepatobiliary: No focal liver abnormality.No evidence of biliary obstruction or stone. Pancreas: Unremarkable. Spleen: Unremarkable. Adrenals/Urinary Tract: Negative adrenals. 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 No hydronephrosis or stone. Unremarkable bladder. Stomach/Bowel: No obstruction. No appendicitis. Few colonic diverticula. Vascular/Lymphatic: Diffuse atherosclerotic calcification of the aorta and iliacs. No acute vascular finding. No mass or adenopathy. Reproductive:No pathologic findings. Other: No ascites or pneumoperitoneum. Lax abdominal wall. There is a superior left lumbar hernia containing fat and descending colon, likely incisional in this patient with left nephrectomy. Large small and large bowel containing left lower quadrant hernia with relatively small neck for the size of the hernia sac. There is chronic reticulation of fat within this hernia sac. There is mild haziness of fat along the associated small bowel mesentery which  may have progressed from prior. Musculoskeletal: No acute or aggressive finding. Lumbar facet arthropathy and spondylosis. IMPRESSION: 1. Chronic abdominal distention correlates with 2 large hernias superimposed on a lax abdominal wall. 2. Left lower quadrant hernia contains small and large bowel. The small bowel mesentery is mildly congested in the hernia sac. Negative for bowel obstruction or wall thickening. 3. A superior left lumbar hernia, likely incisional after nephrectomy, contains ascending colon. 4.  Aortic Atherosclerosis (ICD10-I70.0). Electronically Signed   By: Marnee Spring M.D.   On: 09/01/2017 13:17   Dg Abdomen Acute W/chest  Result Date: 08/26/2017 CLINICAL DATA:  Left upper quadrant pain radiating to the chest and right abdomen. Vomiting. EXAM: DG ABDOMEN ACUTE W/ 1V CHEST COMPARISON:  07/28/2017 CT FINDINGS: Small and large bowel loops are seen within the expected location of the left lateral abdominopelvic hernia previously identified on recent CT. No bowel obstruction is noted. Small rounded density in the left hemiabdomen may reflect contrast within a diverticulum. There is thoracolumbar spondylosis. No acute osseous abnormality. Heart size is normal. There is aortic atherosclerosis at the arch. Minimal atelectasis is seen at the left lung base. No overt pulmonary edema, effusion or pneumothorax. IMPRESSION: 1. Small and large bowel loops are lateralized to the left, some of which are likely to be within the patient's pannus and in the expected location of the known left lateral hernia. 2. There is no bowel obstruction or free air. 3. No acute cardiopulmonary disease. Electronically Signed   By: Tollie Eth M.D.   On: 08/26/2017 22:01     Doug Sou, MD 09/01/17 [phone]

## 2017-09-01 NOTE — ED Provider Notes (Signed)
[facility]-EMERGENCY DEPT Provider Note   CSN: [id number] Arrival date & time: 09/01/17  1107     History   Chief Complaint Chief Complaint  Patient presents with  . Abdominal Pain    HPI [name] [name] is a 76 y.o. female.  HPI Patient presents from her doctor's office for concerns for possible small bowel obstruction. Patient has chronic large ventral hernia and presents with worsening pain to the abdomen, distention of the hernia and nausea and vomiting. Last had a bowel movement this morning.No fever or chills. Past Medical History:  Diagnosis Date  . Diabetes mellitus   . Hypertension   . Renal disorder     Patient Active Problem List   Diagnosis Date Noted  . Ventral hernia without obstruction or gangrene 09/01/2017  . Incarcerated ventral hernia   . Chest pain 06/11/2016  . DIABETES MELLITUS 06/13/2009  . GERD 06/13/2009    Past Surgical History:  Procedure Laterality Date  . NEPHRECTOMY    . VENTRAL HERNIA REPAIR  09/01/2017   mistaken entry    OB History    No data available       Home Medications    Prior to Admission medications   Medication Sig Start Date End Date Taking? Authorizing Provider  aspirin 81 MG tablet Take 81 mg by mouth every evening.    Yes [provider]  aspirin-sod bicarb-citric acid (ALKA-SELTZER) 325 MG TBEF tablet Take 325 mg by mouth every 6 (six) hours as needed (stomach).   Yes [provider]  fish oil-omega-3 fatty acids 1000 MG capsule Take 1 g by mouth 2 (two) times daily.   Yes [provider]  JANUMET 50-1000 MG tablet Take 1 tablet by mouth 2 (two) times daily. 05/13/16  Yes [provider]  ketoconazole (NIZORAL) 2 % cream Apply 1 application topically daily.   Yes [provider]  losartan (COZAAR) 50 MG tablet Take 25 mg by mouth daily.   Yes [provider]  Multiple Vitamin (MULITIVITAMIN WITH MINERALS) TABS Take 1 tablet by mouth daily.   Yes [provider]  nystatin (MYCOSTATIN/NYSTOP) powder Apply topically 3 (three) times daily. Patient taking differently: Apply 1 g topically daily as needed (infection).  07/02/17  Yes Zadie Rhine, MD  HYDROcodone-acetaminophen (NORCO/VICODIN) 5-325 MG tablet Take 1 tablet by mouth every 6 (six) hours as needed for severe pain. 07/28/17   Horton, Mayer Masker, MD    Family History History reviewed. No pertinent family history.  Social History Social History  Substance Use Topics  . Smoking status: Never Smoker  . Smokeless tobacco: Never Used  . Alcohol use No     Allergies   Contrast media [iodinated diagnostic agents]; Morphine and related; Other; Penicillins; and Codeine   Review of Systems Review of Systems  Constitutional: Negative for chills and fever.  Respiratory: Negative for cough and shortness of breath.   Cardiovascular: Negative for chest pain.  Gastrointestinal: Positive for abdominal distention, abdominal pain, nausea and vomiting.  Genitourinary: Negative for dysuria, flank pain and frequency.  Musculoskeletal: Negative for back pain and myalgias.  Neurological: Negative for dizziness, weakness, light-headedness, numbness and headaches.  All other systems reviewed and are negative.    Physical Exam Updated Vital Signs BP (!) 119/58 (BP Location: Left Arm)   Pulse 62   Temp 98.8 F (37.1 C) (Oral)   Resp 16   Ht 5' (1.524 m)   Wt 67.1 kg (147 lb 14.4 oz)   SpO2 96%  BMI 28.88 kg/m   Physical Exam  Constitutional: She is oriented to person, place, and time. She appears well-developed and well-nourished.  HENT:  Head: Normocephalic and atraumatic.  Mouth/Throat: Oropharynx is clear and moist.  Eyes: Pupils are equal, round, and reactive to light. EOM are normal.  Neck: Normal range of motion. Neck supple.  Cardiovascular: Normal rate and regular rhythm.   Pulmonary/Chest: Effort normal and breath sounds normal.  Abdominal: Soft. Bowel sounds are  normal. She exhibits distension. There is tenderness. There is no rebound and no guarding.  Large left-sided ventral hernia. Very tender to palpation. High-pitched bowel sounds noted.  Musculoskeletal: Normal range of motion. She exhibits no edema or tenderness.  Neurological: She is alert and oriented to person, place, and time.  Skin: Skin is warm and dry. No rash noted. No erythema.  Psychiatric: She has a normal mood and affect. Her behavior is normal.  Nursing note and vitals reviewed.    ED Treatments / Results  Labs (all labs ordered are listed, but only abnormal results are displayed) Labs Reviewed  CBC WITH DIFFERENTIAL/PLATELET - Abnormal; Notable for the following:       Result Value   RBC 3.86 (*)    HCT 35.7 (*)    All other components within normal limits  COMPREHENSIVE METABOLIC PANEL - Abnormal; Notable for the following:    Potassium 5.2 (*)    Glucose, Bld 198 (*)    BUN 21 (*)    Creatinine, Ser 1.26 (*)    GFR calc non Af Amer 41 (*)    GFR calc Af Amer 47 (*)    All other components within normal limits  URINALYSIS, ROUTINE W REFLEX MICROSCOPIC - Abnormal; Notable for the following:    APPearance HAZY (*)    Ketones, ur 5 (*)    Protein, ur 30 (*)    Leukocytes, UA TRACE (*)    Bacteria, UA RARE (*)    Squamous Epithelial / LPF 0-5 (*)    All other components within normal limits  BASIC METABOLIC PANEL - Abnormal; Notable for the following:    Glucose, Bld 131 (*)    Creatinine, Ser 1.04 (*)    Calcium 8.4 (*)    GFR calc non Af Amer 51 (*)    GFR calc Af Amer 59 (*)    All other components within normal limits  CBC - Abnormal; Notable for the following:    RBC 3.46 (*)    Hemoglobin 10.8 (*)    HCT 31.9 (*)    All other components within normal limits  HEMOGLOBIN A1C - Abnormal; Notable for the following:    Hgb A1c MFr Bld 6.3 (*)    All other components within normal limits  GLUCOSE, CAPILLARY - Abnormal; Notable for the following:     Glucose-Capillary 102 (*)    All other components within normal limits  GLUCOSE, CAPILLARY - Abnormal; Notable for the following:    Glucose-Capillary 138 (*)    All other components within normal limits  GLUCOSE, CAPILLARY - Abnormal; Notable for the following:    Glucose-Capillary 180 (*)    All other components within normal limits  I-STAT CG4 LACTIC ACID, ED - Abnormal; Notable for the following:    Lactic Acid, Venous 2.92 (*)    All other components within normal limits  I-STAT CG4 LACTIC ACID, ED - Abnormal; Notable for the following:    Lactic Acid, Venous 2.47 (*)    All other components within normal  limits    EKG  EKG Interpretation None       Radiology Ct Abdomen Pelvis Wo Contrast  Result Date: 09/01/2017 CLINICAL DATA:  Left upper quadrant pain. Abdominal distention. Patient states has had swelling of for 44 years, progressive. EXAM: CT ABDOMEN AND PELVIS WITHOUT CONTRAST TECHNIQUE: Multidetector CT imaging of the abdomen and pelvis was performed following the standard protocol without IV contrast. COMPARISON:  07/28/2017 FINDINGS: Lower chest:  No acute finding.  Mild scarring at the bases. Hepatobiliary: No focal liver abnormality.No evidence of biliary obstruction or stone. Pancreas: Unremarkable. Spleen: Unremarkable. Adrenals/Urinary Tract: Negative adrenals. 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 No hydronephrosis or stone. Unremarkable bladder. Stomach/Bowel: No obstruction. No appendicitis. Few colonic diverticula. Vascular/Lymphatic: Diffuse atherosclerotic calcification of the aorta and iliacs. No acute vascular finding. No mass or adenopathy. Reproductive:No pathologic findings. Other: No ascites or pneumoperitoneum. Lax abdominal wall. There is a superior left lumbar hernia containing fat and descending colon, likely incisional in this patient with left nephrectomy. Large small and large bowel containing left lower quadrant hernia with relatively small  neck for the size of the hernia sac. There is chronic reticulation of fat within this hernia sac. There is mild haziness of fat along the associated small bowel mesentery which may have progressed from prior. Musculoskeletal: No acute or aggressive finding. Lumbar facet arthropathy and spondylosis. IMPRESSION: 1. Chronic abdominal distention correlates with 2 large hernias superimposed on a lax abdominal wall. 2. Left lower quadrant hernia contains small and large bowel. The small bowel mesentery is mildly congested in the hernia sac. Negative for bowel obstruction or wall thickening. 3. A superior left lumbar hernia, likely incisional after nephrectomy, contains ascending colon. 4.  Aortic Atherosclerosis (ICD10-I70.0). Electronically Signed   By: Marnee Spring M.D.   On: 09/01/2017 13:17    Procedures Procedures (including critical care time)  Medications Ordered in ED Medications  enoxaparin (LOVENOX) injection 40 mg (40 mg Subcutaneous Given 09/02/17 0856)  0.9 %  sodium chloride infusion ( Intravenous New Bag/Given 09/02/17 0040)  acetaminophen (TYLENOL) tablet 650 mg (not administered)    Or  acetaminophen (TYLENOL) suppository 650 mg (not administered)  oxyCODONE (Oxy IR/ROXICODONE) immediate release tablet 5-10 mg (not administered)  HYDROmorphone (DILAUDID) injection 0.5 mg (not administered)  methocarbamol (ROBAXIN) tablet 500 mg (not administered)  ondansetron (ZOFRAN-ODT) disintegrating tablet 4 mg (not administered)    Or  ondansetron (ZOFRAN) injection 4 mg (not administered)  insulin aspart (novoLOG) injection 0-15 Units (2 Units Subcutaneous Given 09/02/17 0856)  insulin aspart (novoLOG) injection 0-5 Units (0 Units Subcutaneous Not Given 09/02/17 0050)  losartan (COZAAR) tablet 25 mg (25 mg Oral Given 09/02/17 0855)  HYDROmorphone (DILAUDID) injection 0.5 mg (0.5 mg Intravenous Given 09/01/17 1400)  sodium chloride 0.9 % bolus 500 mL (0 mLs Intravenous Stopped 09/01/17 1533)    HYDROmorphone (DILAUDID) injection 0.5 mg (0.5 mg Intravenous Given 09/01/17 1526)  sodium chloride 0.9 % bolus 500 mL (0 mLs Intravenous Stopped 09/01/17 1713)  HYDROmorphone (DILAUDID) injection 0.5 mg (0.5 mg Intravenous Given 09/01/17 1930)     Initial Impression / Assessment and Plan / ED Course  I have reviewed the triage vital signs and the nursing notes.  Pertinent labs & imaging results that were available during my care of the patient were reviewed by me and considered in my medical decision making (see chart for details).    Discussed with Dr. Henreitta Leber who will evaluate patient in the emergency department. Dr. Henreitta Leber evaluated in the emergency department.  Dr. Henreitta Leber asked me to discuss case with surgeon at [facility]. Spoke with Dr. Janee Morn. Review of patient's CT scan and labs. Does not believe that emergent transfer and surgery is necessary at this point. Recommends reducing pain medications and fluids.  Patient states that her pain has improved after IV medication. She still tender to palpation. Dr. Henreitta Leber will reevaluate in the emergency department and disposition. Final Clinical Impressions(s) / ED Diagnoses   Final diagnoses:  Hernia of abdominal wall    New Prescriptions Current Discharge Medication List       Loren Racer, MD 09/02/17 1248

## 2017-09-01 NOTE — ED Notes (Signed)
Call to 6N to give report 

## 2017-09-02 ENCOUNTER — Encounter (HOSPITAL_COMMUNITY): Payer: Self-pay | Admitting: General Practice

## 2017-09-02 ENCOUNTER — Ambulatory Visit: Payer: Medicare Other | Attending: Pulmonary Disease | Admitting: Neurology

## 2017-09-02 DIAGNOSIS — G4733 Obstructive sleep apnea (adult) (pediatric): Secondary | ICD-10-CM | POA: Insufficient documentation

## 2017-09-02 DIAGNOSIS — G4710 Hypersomnia, unspecified: Secondary | ICD-10-CM | POA: Diagnosis not present

## 2017-09-02 DIAGNOSIS — G4730 Sleep apnea, unspecified: Secondary | ICD-10-CM

## 2017-09-02 DIAGNOSIS — R0683 Snoring: Secondary | ICD-10-CM

## 2017-09-02 DIAGNOSIS — K436 Other and unspecified ventral hernia with obstruction, without gangrene: Secondary | ICD-10-CM | POA: Diagnosis not present

## 2017-09-02 LAB — BASIC METABOLIC PANEL
Anion gap: 6 (ref 5–15)
BUN: 12 mg/dL (ref 6–20)
CHLORIDE: 105 mmol/L (ref 101–111)
CO2: 26 mmol/L (ref 22–32)
CREATININE: 1.04 mg/dL — AB (ref 0.44–1.00)
Calcium: 8.4 mg/dL — ABNORMAL LOW (ref 8.9–10.3)
GFR calc Af Amer: 59 mL/min — ABNORMAL LOW (ref >60)
GFR calc non Af Amer: 51 mL/min — ABNORMAL LOW (ref >60)
Glucose, Bld: 131 mg/dL — ABNORMAL HIGH (ref 65–99)
Potassium: 4.2 mmol/L (ref 3.5–5.1)
Sodium: 137 mmol/L (ref 135–145)

## 2017-09-02 LAB — CBC
HEMATOCRIT: 31.9 % — AB (ref 36.0–46.0)
Hemoglobin: 10.8 g/dL — ABNORMAL LOW (ref 12.0–15.0)
MCH: 31.2 pg (ref 26.0–34.0)
MCHC: 33.9 g/dL (ref 30.0–36.0)
MCV: 92.2 fL (ref 78.0–100.0)
PLATELETS: 216 10*3/uL (ref 150–400)
RBC: 3.46 MIL/uL — AB (ref 3.87–5.11)
RDW: 13 % (ref 11.5–15.5)
WBC: 7.3 10*3/uL (ref 4.0–10.5)

## 2017-09-02 LAB — GLUCOSE, CAPILLARY
GLUCOSE-CAPILLARY: 102 mg/dL — AB (ref 65–99)
Glucose-Capillary: 138 mg/dL — ABNORMAL HIGH (ref 65–99)
Glucose-Capillary: 180 mg/dL — ABNORMAL HIGH (ref 65–99)
Glucose-Capillary: 107 mg/dL — ABNORMAL HIGH (ref 65–99)

## 2017-09-02 LAB — HEMOGLOBIN A1C
HEMOGLOBIN A1C: 6.3 % — AB (ref 4.8–5.6)
MEAN PLASMA GLUCOSE: 134.11 mg/dL

## 2017-09-02 MED ORDER — METHOCARBAMOL 750 MG PO TABS: 750.0000 mg | ORAL_TABLET | Freq: Four times a day (QID) | ORAL | 2 refills | Status: DC | PRN

## 2017-09-02 MED ORDER — OXYCODONE HCL 5 MG PO TABS: 5.0000 mg | ORAL_TABLET | Freq: Four times a day (QID) | ORAL | 0 refills | Status: DC | PRN
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
## 2017-09-02 NOTE — Progress Notes (Signed)
Mathis Dad, RN went over discharge instructions with patient and family. Home medications were discussed. Prescriptions given. Follow up appointments are in place. Diet, activity, and reasons to call the doctor discussed. Patient verbalized understanding of instructions.

## 2017-09-02 NOTE — Progress Notes (Signed)
Patient ID: [name] [name], female   DOB: Sep 14, 1941, 76 y.o.   MRN: [id number] I discussed plan with her and added robaxin PRN to D/C meds. SHe will see Dr. Abbey Chatters this Friday at 2pm. I advised her that it may not be possible to repair this long standing hernia. Violeta Gelinas, MD, MPH, FACS Trauma: [phone] General Surgery: [phone]

## 2017-09-04 DIAGNOSIS — K439 Ventral hernia without obstruction or gangrene: Secondary | ICD-10-CM | POA: Diagnosis not present

## 2017-09-04 NOTE — Consult Note (Signed)
           [facility] CM Primary Care Navigator  09/04/2017  CLYTEE HEINRICH 1941/11/21 [id number]   Attempt to seepatient at the bedsideto identify possible discharge needs but she was already discharged. Patient was discharged homeper staff report.  Primary care provider's office called (Joy Smith)to notify of patient's discharge and needfor post hospital follow-up and transition of care. Joy, RN was notified ofpatient's health issues needing follow-up.   Made aware to refer patient to [facility] care management ifdeemed appropriateand necessary for further services.   For questions, please contact:  Wyatt Haste, BSN, RN- [facility] Primary Care Navigator  Telephone: [phone] [facility]

## 2017-09-13 NOTE — Procedures (Signed)
[facility] Audrie Kuri A. Gerilyn Pilgrim, MD     [email or web address]             NOCTURNAL POLYSOMNOGRAPHY   LOCATION: [facility]  Patient Name: [name] [name], [name] [name] Date: 09/02/2017 Gender: Female D.O.B: [birth year] Age (years): 59 Referring Provider: Kari Baars Height (inches): 60 Interpreting Physician: Beryle Beams MD, ABSM Weight (lbs): 147 RPSGT: Peak, Robert BMI: 29 MRN: [id number] Neck Size: 15.50 CLINICAL INFORMATION Sleep Study Type: NPSG  Indication for sleep study: Excessive Daytime Sleepiness, Snoring  Epworth Sleepiness Score: 11  SLEEP STUDY TECHNIQUE As per the AASM Manual for the Scoring of Sleep and Associated Events v2.3 (April 2016) with a hypopnea requiring 4% desaturations.  The channels recorded and monitored were frontal, central and occipital EEG, electrooculogram (EOG), submentalis EMG (chin), nasal and oral airflow, thoracic and abdominal wall motion, anterior tibialis EMG, snore microphone, electrocardiogram, and pulse oximetry.  MEDICATIONS Medications self-administered by patient taken the night of the study : N/A  Current Outpatient Prescriptions:  .  aspirin 81 MG tablet, Take 81 mg by mouth every evening. , Disp: , Rfl:  .  aspirin-sod bicarb-citric acid (ALKA-SELTZER) 325 MG TBEF tablet, Take 325 mg by mouth every 6 (six) hours as needed (stomach)., Disp: , Rfl:  .  fish oil-omega-3 fatty acids 1000 MG capsule, Take 1 g by mouth 2 (two) times daily., Disp: , Rfl:  .  JANUMET 50-1000 MG tablet, Take 1 tablet by mouth 2 (two) times daily., Disp: , Rfl:  .  ketoconazole (NIZORAL) 2 % cream, Apply 1 application topically daily., Disp: , Rfl:  .  losartan (COZAAR) 50 MG tablet, Take 25 mg by mouth daily., Disp: , Rfl:  .  methocarbamol (ROBAXIN) 750 MG tablet, Take 1 tablet (750 mg total) by mouth 4 (four) times daily as needed (use for muscle cramps/pain)., Disp: 30 tablet, Rfl: 2 .  Multiple Vitamin (MULITIVITAMIN WITH MINERALS)  TABS, Take 1 tablet by mouth daily., Disp: , Rfl:  .  nystatin (MYCOSTATIN/NYSTOP) powder, Apply topically 3 (three) times daily. (Patient taking differently: Apply 1 g topically daily as needed (infection). ), Disp: 15 g, Rfl: 0 .  oxyCODONE (OXY IR/ROXICODONE) 5 MG immediate release tablet, Take 1-2 tablets (5-10 mg total) by mouth every 6 (six) hours as needed for moderate pain, severe pain or breakthrough pain., Disp: 30 tablet, Rfl: 0   SLEEP ARCHITECTURE The study was initiated at 10:13:15 PM and ended at 4:41:07 AM.  Sleep onset time was 5.3 minutes and the sleep efficiency was 96.0%. The total sleep time was 372.5 minutes.  Stage REM latency was 62.5 minutes.  The patient spent 6.84% of the night in stage N1 sleep, 80.00% in stage N2 sleep, 1.74% in stage N3 and 11.41% in REM.  Alpha intrusion was absent.  Supine sleep was 100.00%.  RESPIRATORY PARAMETERS The overall apnea/hypopnea index (AHI) was 32.2 per hour. There were 65 total apneas, including 65 obstructive, 0 central and 0 mixed apneas. There were 135 hypopneas and 164 RERAs.  The AHI during Stage REM sleep was 33.9 per hour.  AHI while supine was 32.2 per hour.  The mean oxygen saturation was 93.28%. The minimum SpO2 during sleep was 81.00%.  moderate snoring was noted during this study.  CARDIAC DATA The 2 lead EKG demonstrated sinus rhythm. The mean heart rate was 68.89 beats per minute. Other EKG findings include: None. LEG MOVEMENT DATA The total PLMS were 0 with a resulting PLMS index of 0.00. Associated arousal with leg movement index  was 0.0.  IMPRESSIONS - Moderate obstructive sleep apnea is documented during the study. The patient should have a formal CPAP titration study.   Argie Ramming, MD Diplomate, American Board of Sleep Medicine.  ELECTRONICALLY SIGNED ON:  09/13/2017, 5:31 PM [facility] PH: [phone]   FX: [phone] ACCREDITED BY THE AMERICAN ACADEMY OF  SLEEP MEDICINE

## 2017-09-19 ENCOUNTER — Emergency Department (HOSPITAL_COMMUNITY): Payer: Medicare Other

## 2017-09-19 ENCOUNTER — Emergency Department (HOSPITAL_COMMUNITY)
Admission: EM | Admit: 2017-09-19 | Discharge: 2017-09-19 | Disposition: A | Discharge status: Home / Self Care | Payer: Medicare Other | Attending: Emergency Medicine | Admitting: Emergency Medicine

## 2017-09-19 ENCOUNTER — Encounter (HOSPITAL_COMMUNITY): Payer: Self-pay | Admitting: Emergency Medicine

## 2017-09-19 DIAGNOSIS — Z7984 Long term (current) use of oral hypoglycemic drugs: Secondary | ICD-10-CM | POA: Insufficient documentation

## 2017-09-19 DIAGNOSIS — R140 Abdominal distension (gaseous): Secondary | ICD-10-CM | POA: Diagnosis not present

## 2017-09-19 DIAGNOSIS — K469 Unspecified abdominal hernia without obstruction or gangrene: Secondary | ICD-10-CM | POA: Diagnosis not present

## 2017-09-19 DIAGNOSIS — Z79899 Other long term (current) drug therapy: Secondary | ICD-10-CM | POA: Diagnosis not present

## 2017-09-19 DIAGNOSIS — Z7982 Long term (current) use of aspirin: Secondary | ICD-10-CM | POA: Diagnosis not present

## 2017-09-19 DIAGNOSIS — R1030 Lower abdominal pain, unspecified: Secondary | ICD-10-CM | POA: Diagnosis not present

## 2017-09-19 DIAGNOSIS — E119 Type 2 diabetes mellitus without complications: Secondary | ICD-10-CM | POA: Diagnosis not present

## 2017-09-19 DIAGNOSIS — I10 Essential (primary) hypertension: Secondary | ICD-10-CM | POA: Diagnosis not present

## 2017-09-19 DIAGNOSIS — R1110 Vomiting, unspecified: Secondary | ICD-10-CM | POA: Diagnosis not present

## 2017-09-19 LAB — CBC WITH DIFFERENTIAL/PLATELET
BASOS PCT: 0 %
Basophils Absolute: 0 10*3/uL (ref 0.0–0.1)
EOS ABS: 0.1 10*3/uL (ref 0.0–0.7)
Eosinophils Relative: 1 %
HCT: 36.1 % (ref 36.0–46.0)
Hemoglobin: 12.1 g/dL (ref 12.0–15.0)
Lymphocytes Relative: 15 %
Lymphs Abs: 1.6 10*3/uL (ref 0.7–4.0)
MCH: 31.1 pg (ref 26.0–34.0)
MCHC: 33.5 g/dL (ref 30.0–36.0)
MCV: 92.8 fL (ref 78.0–100.0)
Monocytes Absolute: 0.5 10*3/uL (ref 0.1–1.0)
Monocytes Relative: 5 %
NEUTROS PCT: 79 %
Neutro Abs: 8.5 10*3/uL — ABNORMAL HIGH (ref 1.7–7.7)
Platelets: 278 10*3/uL (ref 150–400)
RBC: 3.89 MIL/uL (ref 3.87–5.11)
RDW: 12.6 % (ref 11.5–15.5)
WBC: 10.8 10*3/uL — AB (ref 4.0–10.5)

## 2017-09-19 LAB — COMPREHENSIVE METABOLIC PANEL
ALBUMIN: 3.9 g/dL (ref 3.5–5.0)
ALK PHOS: 54 U/L (ref 38–126)
ALT: 23 U/L (ref 14–54)
AST: 21 U/L (ref 15–41)
Anion gap: 11 (ref 5–15)
BILIRUBIN TOTAL: 0.5 mg/dL (ref 0.3–1.2)
BUN: 21 mg/dL — ABNORMAL HIGH (ref 6–20)
CHLORIDE: 103 mmol/L (ref 101–111)
CO2: 29 mmol/L (ref 22–32)
CREATININE: 1.22 mg/dL — AB (ref 0.44–1.00)
Calcium: 9.2 mg/dL (ref 8.9–10.3)
GFR calc Af Amer: 49 mL/min — ABNORMAL LOW (ref >60)
GFR calc non Af Amer: 42 mL/min — ABNORMAL LOW (ref >60)
Glucose, Bld: 171 mg/dL — ABNORMAL HIGH (ref 65–99)
Potassium: 5.2 mmol/L — ABNORMAL HIGH (ref 3.5–5.1)
Sodium: 143 mmol/L (ref 135–145)
Total Protein: 7.4 g/dL (ref 6.5–8.1)

## 2017-09-19 MED ORDER — ONDANSETRON HCL 4 MG/2ML IJ SOLN
4.0000 mg | Freq: Once | INTRAMUSCULAR | Status: AC
  Administered 2017-09-19: 4 mg via INTRAVENOUS
  Filled 2017-09-19: qty 2

## 2017-09-19 MED ORDER — ONDANSETRON 4 MG PO TBDP: ORAL_TABLET | ORAL | 0 refills | Status: DC

## 2017-09-19 MED ORDER — HYDROMORPHONE HCL 1 MG/ML IJ SOLN
0.5000 mg | Freq: Once | INTRAMUSCULAR | Status: AC
  Administered 2017-09-19: 0.5 mg via INTRAVENOUS
  Filled 2017-09-19: qty 1

## 2017-09-19 MED ORDER — SODIUM CHLORIDE 0.9 % IV BOLUS (SEPSIS)
500.0000 mL | Freq: Once | INTRAVENOUS | Status: AC
  Administered 2017-09-19: 500 mL via INTRAVENOUS

## 2017-09-19 NOTE — ED Notes (Signed)
Pt reports increase pain in her left side. EDP notified

## 2017-09-19 NOTE — ED Provider Notes (Signed)
[facility]-EMERGENCY DEPT Provider Note   CSN: [id number] Arrival date & time: 09/19/17  1621     History   Chief Complaint Chief Complaint  Patient presents with  . Abdominal Pain    HPI [name] [name] is a 76 y.o. female.  Patient states that she's had some nausea and vomiting today. Patient has history of a large hernia in her abdomen for years that she has chronic pain with   The history is provided by the patient.  Abdominal Pain   This is a new problem. The current episode started 3 to 5 hours ago. The problem occurs constantly. The problem has not changed since onset.The pain is associated with an unknown factor. The pain is located in the LLQ. The quality of the pain is aching. The pain is at a severity of 4/10. The pain is moderate. Associated symptoms include nausea. Pertinent negatives include anorexia, diarrhea, frequency, hematuria and headaches.    Past Medical History:  Diagnosis Date  . Diabetes mellitus   . Hypertension   . Renal disorder     Patient Active Problem List   Diagnosis Date Noted  . Ventral hernia without obstruction or gangrene 09/01/2017  . Incarcerated ventral hernia   . Chest pain 06/11/2016  . DIABETES MELLITUS 06/13/2009  . GERD 06/13/2009    Past Surgical History:  Procedure Laterality Date  . NEPHRECTOMY    . VENTRAL HERNIA REPAIR  09/01/2017   mistaken entry    OB History    No data available       Home Medications    Prior to Admission medications   Medication Sig Start Date End Date Taking? Authorizing Provider  acetaminophen (TYLENOL) 325 MG tablet Take 650 mg by mouth every 6 (six) hours as needed for moderate pain.   Yes [provider]  aspirin 81 MG tablet Take 81 mg by mouth every evening.    Yes [provider]  aspirin-sod bicarb-citric acid (ALKA-SELTZER) 325 MG TBEF tablet Take 325 mg by mouth every 6 (six) hours as needed (stomach).   Yes [provider]  fish oil-omega-3 fatty acids  1000 MG capsule Take 1 g by mouth 2 (two) times daily.   Yes [provider]  JANUMET 50-1000 MG tablet Take 1 tablet by mouth 2 (two) times daily. 05/13/16  Yes [provider]  ketoconazole (NIZORAL) 2 % cream Apply 1 application topically daily.   Yes [provider]  losartan (COZAAR) 50 MG tablet Take 25 mg by mouth daily.   Yes [provider]  methocarbamol (ROBAXIN) 750 MG tablet Take 1 tablet (750 mg total) by mouth 4 (four) times daily as needed (use for muscle cramps/pain). 09/02/17  Yes Violeta Gelinas, MD  Multiple Vitamin (MULITIVITAMIN WITH MINERALS) TABS Take 1 tablet by mouth daily.   Yes [provider]  nystatin (MYCOSTATIN/NYSTOP) powder Apply topically 3 (three) times daily. Patient taking differently: Apply 1 g topically daily as needed (infection).  07/02/17  Yes Zadie Rhine, MD  oxyCODONE (OXY IR/ROXICODONE) 5 MG immediate release tablet Take 1-2 tablets (5-10 mg total) by mouth every 6 (six) hours as needed for moderate pain, severe pain or breakthrough pain. 09/02/17  Yes Violeta Gelinas, MD  ondansetron (ZOFRAN ODT) 4 MG disintegrating tablet  ODT q4 hours prn nausea/vomit 09/19/17   Bethann Berkshire, MD  ondansetron (ZOFRAN ODT) 4 MG disintegrating tablet  ODT q4 hours prn nausea/vomit 09/19/17   Bethann Berkshire, MD    Family History History reviewed. No  pertinent family history.  Social History Social History  Substance Use Topics  . Smoking status: Never Smoker  . Smokeless tobacco: Never Used  . Alcohol use No     Allergies   Contrast media [iodinated diagnostic agents]; Morphine and related; Other; Penicillins; and Codeine   Review of Systems Review of Systems  Constitutional: Negative for appetite change and fatigue.  HENT: Negative for congestion, ear discharge and sinus pressure.   Eyes: Negative for discharge.  Respiratory: Negative for cough.   Cardiovascular: Negative for chest pain.    Gastrointestinal: Positive for abdominal pain and nausea. Negative for anorexia and diarrhea.  Genitourinary: Negative for frequency and hematuria.  Musculoskeletal: Negative for back pain.  Skin: Negative for rash.  Neurological: Negative for seizures and headaches.  Psychiatric/Behavioral: Negative for hallucinations.     Physical Exam Updated Vital Signs BP 106/67   Pulse 82   Temp 98 F (36.7 C) (Oral)   Resp 19   Ht 5' (1.524 m)   Wt 66.7 kg (147 lb)   SpO2 95%   BMI 28.71 kg/m   Physical Exam  Constitutional: She is oriented to person, place, and time. She appears well-developed.  HENT:  Head: Normocephalic.  Eyes: Conjunctivae and EOM are normal. No scleral icterus.  Neck: Neck supple. No thyromegaly present.  Cardiovascular: Normal rate and regular rhythm.  Exam reveals no gallop and no friction rub.   No murmur heard. Pulmonary/Chest: No stridor. She has no wheezes. She has no rales. She exhibits no tenderness.  Abdominal: She exhibits no distension. There is no tenderness. There is no rebound.  Large abdominal hernia on left lower quadrant. Mild tenderness  Musculoskeletal: Normal range of motion. She exhibits no edema.  Lymphadenopathy:    She has no cervical adenopathy.  Neurological: She is oriented to person, place, and time. She exhibits normal muscle tone. Coordination normal.  Skin: No rash noted. No erythema.  Psychiatric: She has a normal mood and affect. Her behavior is normal.     ED Treatments / Results  Labs (all labs ordered are listed, but only abnormal results are displayed) Labs Reviewed  CBC WITH DIFFERENTIAL/PLATELET - Abnormal; Notable for the following:       Result Value   WBC 10.8 (*)    Neutro Abs 8.5 (*)    All other components within normal limits  COMPREHENSIVE METABOLIC PANEL - Abnormal; Notable for the following:    Potassium 5.2 (*)    Glucose, Bld 171 (*)    BUN 21 (*)    Creatinine, Ser 1.22 (*)    GFR calc non Af  Amer 42 (*)    GFR calc Af Amer 49 (*)    All other components within normal limits    EKG  EKG Interpretation None       Radiology Ct Abdomen Pelvis Wo Contrast  Result Date: 09/19/2017 CLINICAL DATA:  Acute onset of left upper and lower quadrant abdominal pain and vomiting. Abdominal distention. Initial encounter. EXAM: CT ABDOMEN AND PELVIS WITHOUT CONTRAST TECHNIQUE: Multidetector CT imaging of the abdomen and pelvis was performed following the standard protocol without IV contrast. COMPARISON:  CT of the abdomen and pelvis from 09/01/2017 FINDINGS: Lower chest: Minimal atelectasis or scarring is noted at the left lung base. Calcification is seen at the ascending thoracic aorta. Hepatobiliary: The liver is unremarkable in appearance. The gallbladder is unremarkable in appearance. The common bile duct remains normal in caliber. Pancreas: The pancreas is within normal limits. Spleen: The spleen  is unremarkable in appearance. Adrenals/Urinary Tract: The adrenal glands are unremarkable in appearance. The patient is status post left-sided nephrectomy. Nonspecific perinephric stranding is noted about the right kidney. The right kidney is otherwise unremarkable. There is no evidence of hydronephrosis. No renal or ureteral stones are identified. Stomach/Bowel: The stomach is grossly unremarkable in appearance. There is herniation of small and large bowel loops into a complex large left lateral abdominal wall hernia. There is also herniation of the splenic flexure of the colon into a focal hernia along the left flank. There is no evidence for bowel obstruction at this time. The appendix is normal in caliber, without evidence for appendicitis. Remaining visualized small and large bowel loops are grossly unremarkable. Vascular/Lymphatic: Diffuse calcification is seen along the abdominal aorta and its branches. The abdominal aorta is otherwise grossly unremarkable. The inferior vena cava is grossly  unremarkable. No retroperitoneal lymphadenopathy is seen. No pelvic sidewall lymphadenopathy is identified. Reproductive: The bladder is mildly distended and grossly unremarkable. The uterus is unremarkable in appearance. The ovaries are relatively symmetric. No suspicious adnexal masses are seen. Other: No additional soft tissue abnormalities are seen. Musculoskeletal: No acute osseous abnormalities are identified. Facet disease is noted along the lumbar spine. The visualized musculature is unremarkable in appearance. IMPRESSION: 1. No acute abnormality seen to explain the patient's symptoms. 2. Herniation of small and large bowel loops into a complex large left lateral abdominal wall hernia. Herniation of the splenic flexure of the colon into a focal hernia along the left flank. No evidence for bowel obstruction. 3. Diffuse aortic atherosclerosis. Electronically Signed   By: Roanna Raider M.D.   On: 09/19/2017 20:14    Procedures Procedures (including critical care time)  Medications Ordered in ED Medications  sodium chloride 0.9 % bolus 500 mL (0 mLs Intravenous Stopped 09/19/17 1942)  ondansetron (ZOFRAN) injection 4 mg (4 mg Intravenous Given 09/19/17 1728)  HYDROmorphone (DILAUDID) injection 0.5 mg (0.5 mg Intravenous Given 09/19/17 1852)  ondansetron (ZOFRAN) injection 4 mg (4 mg Intravenous Given 09/19/17 1850)     Initial Impression / Assessment and Plan / ED Course  I have reviewed the triage vital signs and the nursing notes.  Pertinent labs & imaging results that were available during my care of the patient were reviewed by me and considered in my medical decision making (see chart for details).     CT scan shows no bowel obstruction. Nausea is improved with Zofran. Patient is given Zofran to take at home and she is going to take her own pain medicine home and continue to follow-up with the surgeon  Final Clinical Impressions(s) / ED Diagnoses   Final diagnoses:  Lower abdominal  pain    New Prescriptions New Prescriptions   ONDANSETRON (ZOFRAN ODT) 4 MG DISINTEGRATING TABLET     ODT q4 hours prn nausea/vomit   ONDANSETRON (ZOFRAN ODT) 4 MG DISINTEGRATING TABLET     ODT q4 hours prn nausea/vomit     Bethann Berkshire, MD 09/19/17 2107

## 2017-09-19 NOTE — Discharge Instructions (Signed)
Follow-up this week with her doctor not improving

## 2017-09-19 NOTE — ED Triage Notes (Signed)
Pt reports left upper and lower quad pain with vomiting starting this morning.

## 2017-09-21 MED FILL — Ondansetron HCl Tab 4 MG: ORAL | Qty: 4 | Status: AC

## 2017-09-28 DIAGNOSIS — J449 Chronic obstructive pulmonary disease, unspecified: Secondary | ICD-10-CM | POA: Diagnosis not present

## 2017-09-28 DIAGNOSIS — G4733 Obstructive sleep apnea (adult) (pediatric): Secondary | ICD-10-CM | POA: Diagnosis not present

## 2017-09-28 DIAGNOSIS — K469 Unspecified abdominal hernia without obstruction or gangrene: Secondary | ICD-10-CM | POA: Diagnosis not present

## 2017-09-28 DIAGNOSIS — E119 Type 2 diabetes mellitus without complications: Secondary | ICD-10-CM | POA: Diagnosis not present

## 2017-11-12 DIAGNOSIS — I10 Essential (primary) hypertension: Secondary | ICD-10-CM | POA: Diagnosis not present

## 2017-11-12 DIAGNOSIS — Z23 Encounter for immunization: Secondary | ICD-10-CM | POA: Diagnosis not present

## 2017-11-12 DIAGNOSIS — F419 Anxiety disorder, unspecified: Secondary | ICD-10-CM | POA: Diagnosis not present

## 2017-11-12 DIAGNOSIS — E119 Type 2 diabetes mellitus without complications: Secondary | ICD-10-CM | POA: Diagnosis not present

## 2017-11-12 DIAGNOSIS — J449 Chronic obstructive pulmonary disease, unspecified: Secondary | ICD-10-CM | POA: Diagnosis not present

## 2017-12-09 ENCOUNTER — Encounter (HOSPITAL_COMMUNITY): Payer: Self-pay

## 2017-12-09 ENCOUNTER — Emergency Department (HOSPITAL_COMMUNITY)
Admission: EM | Admit: 2017-12-09 | Discharge: 2017-12-09 | Disposition: A | Discharge status: Home / Self Care | Payer: Medicare Other | Attending: Emergency Medicine | Admitting: Emergency Medicine

## 2017-12-09 ENCOUNTER — Emergency Department (HOSPITAL_COMMUNITY): Payer: Medicare Other

## 2017-12-09 DIAGNOSIS — Z880 Allergy status to penicillin: Secondary | ICD-10-CM | POA: Insufficient documentation

## 2017-12-09 DIAGNOSIS — Z885 Allergy status to narcotic agent status: Secondary | ICD-10-CM | POA: Diagnosis not present

## 2017-12-09 DIAGNOSIS — E119 Type 2 diabetes mellitus without complications: Secondary | ICD-10-CM | POA: Diagnosis not present

## 2017-12-09 DIAGNOSIS — M436 Torticollis: Secondary | ICD-10-CM | POA: Insufficient documentation

## 2017-12-09 DIAGNOSIS — Z7982 Long term (current) use of aspirin: Secondary | ICD-10-CM | POA: Diagnosis not present

## 2017-12-09 DIAGNOSIS — Z79899 Other long term (current) drug therapy: Secondary | ICD-10-CM | POA: Diagnosis not present

## 2017-12-09 DIAGNOSIS — M5030 Other cervical disc degeneration, unspecified cervical region: Secondary | ICD-10-CM

## 2017-12-09 DIAGNOSIS — M542 Cervicalgia: Secondary | ICD-10-CM | POA: Diagnosis not present

## 2017-12-09 DIAGNOSIS — I10 Essential (primary) hypertension: Secondary | ICD-10-CM | POA: Diagnosis not present

## 2017-12-09 DIAGNOSIS — Z7984 Long term (current) use of oral hypoglycemic drugs: Secondary | ICD-10-CM | POA: Insufficient documentation

## 2017-12-09 DIAGNOSIS — R51 Headache: Secondary | ICD-10-CM | POA: Diagnosis not present

## 2017-12-09 DIAGNOSIS — R221 Localized swelling, mass and lump, neck: Secondary | ICD-10-CM | POA: Diagnosis not present

## 2017-12-09 MED ORDER — HYDROCODONE-ACETAMINOPHEN 5-325 MG PO TABS
1.0000 | ORAL_TABLET | Freq: Once | ORAL | Status: AC
  Administered 2017-12-09: 1 via ORAL
  Filled 2017-12-09: qty 1

## 2017-12-09 MED ORDER — ONDANSETRON HCL 4 MG PO TABS: 4.0000 mg | ORAL_TABLET | Freq: Four times a day (QID) | ORAL | 0 refills | Status: DC

## 2017-12-09 MED ORDER — METHOCARBAMOL 500 MG PO TABS
500.0000 mg | ORAL_TABLET | Freq: Once | ORAL | Status: AC
  Administered 2017-12-09: 500 mg via ORAL
  Filled 2017-12-09: qty 1

## 2017-12-09 MED ORDER — ONDANSETRON HCL 4 MG PO TABS
4.0000 mg | ORAL_TABLET | Freq: Once | ORAL | Status: AC
  Administered 2017-12-09: 4 mg via ORAL
  Filled 2017-12-09: qty 1

## 2017-12-09 MED ORDER — HYDROCODONE-ACETAMINOPHEN 5-325 MG PO TABS: 1.0000 | ORAL_TABLET | Freq: Four times a day (QID) | ORAL | 0 refills | Status: DC | PRN

## 2017-12-09 MED ORDER — ONDANSETRON HCL 4 MG PO TABS: 4.0000 mg | ORAL_TABLET | Freq: Once | ORAL | Status: DC

## 2017-12-09 NOTE — ED Triage Notes (Signed)
Last week my head and neck started hurting and I called Dr. Juanetta GoslingHawkins and was placed on muscle relaxers and steroids.  My left shoulder and left side of my neck was swollen and hurting.  Cannot turn my head.  It hurts and pulls from my shoulder into my neck.  I was bending over and had some books come down and hit me in the head yesterday.

## 2017-12-09 NOTE — ED Provider Notes (Signed)
[facility] EMERGENCY DEPARTMENT Provider Note   CSN: [id number] Arrival date & time: 12/09/17  1359     History   Chief Complaint Chief Complaint  Patient presents with  . Neck Pain    HPI Veatrice BourbonJoyce H Masci is a 76 y.o. female.  Patient is a 76 year old female who presents to the emergency department with complaint of head and neck pain.  The patient states that approximately a week ago she began to have neck pain and later some headache related to the neck pain.  She notified her primary physician and was placed on muscle relaxer and steroids.  The patient states she only took one steroid tablet about 2 maybe 3 days.  And she did even less with the steroid medication.  She states that she pain came to the emergency department because she still cannot turn her head or neck without problem.  The patient also reports that she was bending over and some books came down off of a shelf and hit her on the back of the head and neck on yesterday December 25.  No loss of consciousness reported.  No difficulty with using extremities.  No changes in speech.  No difficulty with eating.  Patient request evaluation concerning her neck.      Past Medical History:  Diagnosis Date  . Diabetes mellitus   . Hypertension   . Renal disorder     Patient Active Problem List   Diagnosis Date Noted  . Ventral hernia without obstruction or gangrene 09/01/2017  . Incarcerated ventral hernia   . Chest pain 06/11/2016  . DIABETES MELLITUS 06/13/2009  . GERD 06/13/2009    Past Surgical History:  Procedure Laterality Date  . NEPHRECTOMY    . VENTRAL HERNIA REPAIR  09/01/2017   mistaken entry    OB History    No data available       Home Medications    Prior to Admission medications   Medication Sig Start Date End Date Taking? Authorizing Provider  acetaminophen (TYLENOL) 325 MG tablet Take 650 mg by mouth every 6 (six) hours as needed for moderate pain.    [provider]    aspirin 81 MG tablet Take 81 mg by mouth every evening.     [provider]  aspirin-sod bicarb-citric acid (ALKA-SELTZER) 325 MG TBEF tablet Take 325 mg by mouth every 6 (six) hours as needed (stomach).    [provider]  fish oil-omega-3 fatty acids 1000 MG capsule Take 1 g by mouth 2 (two) times daily.    [provider]  JANUMET 50-1000 MG tablet Take 1 tablet by mouth 2 (two) times daily. 05/13/16   [provider]  ketoconazole (NIZORAL) 2 % cream Apply 1 application topically daily.    [provider]  losartan (COZAAR) 50 MG tablet Take 25 mg by mouth daily.    [provider]  methocarbamol (ROBAXIN) 750 MG tablet Take 1 tablet (750 mg total) by mouth 4 (four) times daily as needed (use for muscle cramps/pain). 09/02/17   Violeta Gelinashompson, Burke, MD  Multiple Vitamin (MULITIVITAMIN WITH MINERALS) TABS Take 1 tablet by mouth daily.    [provider]  nystatin (MYCOSTATIN/NYSTOP) powder Apply topically 3 (three) times daily. Patient taking differently: Apply 1 g topically daily as needed (infection).  07/02/17   Zadie RhineWickline, Donald, MD  ondansetron (ZOFRAN ODT) 4 MG disintegrating tablet 4mg  ODT q4 hours prn nausea/vomit 09/19/17   Bethann BerkshireZammit, Joseph, MD  ondansetron (ZOFRAN ODT) 4 MG disintegrating  tablet 4mg  ODT q4 hours prn nausea/vomit 09/19/17   Bethann BerkshireZammit, Joseph, MD  oxyCODONE (OXY IR/ROXICODONE) 5 MG immediate release tablet Take 1-2 tablets (5-10 mg total) by mouth every 6 (six) hours as needed for moderate pain, severe pain or breakthrough pain. 09/02/17   Violeta Gelinashompson, Burke, MD    Family History History reviewed. No pertinent family history.  Social History Social History   Tobacco Use  . Smoking status: Never Smoker  . Smokeless tobacco: Never Used  Substance Use Topics  . Alcohol use: No  . Drug use: No     Allergies   Contrast media [iodinated diagnostic agents]; Morphine and related; Other; Penicillins; and  Codeine   Review of Systems Review of Systems  Constitutional: Negative for activity change.       All ROS Neg except as noted in HPI  HENT: Negative for nosebleeds.   Eyes: Negative for photophobia and discharge.  Respiratory: Negative for cough, shortness of breath and wheezing.   Cardiovascular: Negative for chest pain and palpitations.  Gastrointestinal: Negative for abdominal pain and blood in stool.  Genitourinary: Negative for dysuria, frequency and hematuria.  Musculoskeletal: Positive for neck pain. Negative for arthralgias and back pain.  Skin: Negative.   Neurological: Positive for headaches. Negative for dizziness, seizures and speech difficulty.  Psychiatric/Behavioral: Negative for confusion and hallucinations.     Physical Exam Updated Vital Signs BP 122/74 (BP Location: Right Arm)   Pulse (!) 103   Temp 99.5 F (37.5 C) (Oral)   Resp 18   Ht 5' (1.524 m)   Wt 66.3 kg (146 lb 1 oz)   SpO2 95%   BMI 28.53 kg/m   Physical Exam  Constitutional: She is oriented to person, place, and time. She appears well-developed and well-nourished.  Non-toxic appearance.  HENT:  Head: Normocephalic.  Right Ear: Tympanic membrane and external ear normal.  Left Ear: Tympanic membrane and external ear normal.  Eyes: EOM and lids are normal. Pupils are equal, round, and reactive to light.  Neck: Normal range of motion. Neck supple. Carotid bruit is not present.  Cardiovascular: Normal rate, regular rhythm, normal heart sounds, intact distal pulses and normal pulses.  Pulmonary/Chest: Breath sounds normal. No respiratory distress.  Abdominal: Soft. Bowel sounds are normal. There is no tenderness. There is no guarding.  Musculoskeletal: Normal range of motion.  There is tightness and tenseness of the left upper trapezius.  There is no bruise or contusion noted about the head or neck.  No palpable step-off of the cervical or thoracic spine.  Lymphadenopathy:       Head (right  side): No submandibular adenopathy present.       Head (left side): No submandibular adenopathy present.    She has no cervical adenopathy.  Neurological: She is alert and oriented to person, place, and time. She has normal strength. No cranial nerve deficit or sensory deficit. She exhibits normal muscle tone. Coordination normal.  Grip is symmetrical.  There is no change in motor strength of the upper or lower extremities.  No sensory deficits appreciated.  Gait is steady and intact.  Skin: Skin is warm and dry.  Psychiatric: She has a normal mood and affect. Her speech is normal.  Nursing note and vitals reviewed.    ED Treatments / Results  Labs (all labs ordered are listed, but only abnormal results are displayed) Labs Reviewed - No data to display  EKG  EKG Interpretation None       Radiology Ct Cervical  Spine Wo Contrast  Result Date: 12/09/2017 CLINICAL DATA:  Head and neck pain beginning last week, LEFT neck swelling, unable to rotate head. Struck in head by books yesterday. EXAM: CT CERVICAL SPINE WITHOUT CONTRAST TECHNIQUE: Multidetector CT imaging of the cervical spine was performed without intravenous contrast. Multiplanar CT image reconstructions were also generated. COMPARISON:  CT cervical spine March 12, 2012 FINDINGS: ALIGNMENT: Maintained lordosis. Vertebral bodies in alignment. SKULL BASE AND VERTEBRAE: Cervical vertebral bodies and posterior elements are intact. Intervertebral disc heights preserved. Osteopenia. T3 hemangioma, unchanged. No destructive bony lesions. C1-2 articulation maintained, mild arthropathy. Moderate to severe multilevel bilateral facet arthropathy. SOFT TISSUES AND SPINAL CANAL: Nonacute. Mild calcific atherosclerosis carotid bifurcations. Retropharyngeal course of RIGHT carotid artery. DISC LEVELS: No significant osseous canal stenosis. Severe LEFT C3-4 and severe LEFT C6-7 neural foraminal narrowing. UPPER CHEST: Lung apices are clear. OTHER:  Mild sphenoid sinus mucosal thickening versus small air-fluid level. IMPRESSION: 1. Degenerative change of the cervical spine without fracture or malalignment. 2. Severe LEFT C3-4 and LEFT C6-7 neural foraminal narrowing. Electronically Signed   By: Awilda Metro M.D.   On: 12/09/2017 17:48    Procedures Procedures (including critical care time)  Medications Ordered in ED Medications - No data to display   Initial Impression / Assessment and Plan / ED Course  I have reviewed the triage vital signs and the nursing notes.  Pertinent labs & imaging results that were available during my care of the patient were reviewed by me and considered in my medical decision making (see chart for details).       Final Clinical Impressions(s) / ED Diagnoses  Vital signs reviewed.  Pulse oximetry is 95% on room air.  Within normal limits by my interpretation.  CT scan of the cervical spine reveals severe C3-C4 and severe C5-6 C7 neuroforaminal narrowing.  There is no fracture or malalignment noted.  Patient seen by Dr. Adriana Simas  The examination findings and the CT findings were discussed with the patient and her family in terms of which they understand.  Questions were answered.  I have asked the patient to follow-up with the C3-C4 and C5-C6 neuroforaminal narrowing.  Prescription for Norco and Zofran given to the patient.  I have asked the patient to use her muscle relaxer at least twice a day.  I warned her of the muscle relaxer and the pain medication causing drowsiness.  Instructed her to use extreme caution when getting around with these medications.  Patient and the family acknowledge understanding of these instructions.  Patient will return to the emergency department if any emergent changes, problems, or concerns.   Final diagnoses:  Torticollis  DDD (degenerative disc disease), cervical    ED Discharge Orders        Ordered    HYDROcodone-acetaminophen (NORCO/VICODIN) 5-325 MG tablet   Every 6 hours PRN     12/09/17 1935    ondansetron (ZOFRAN) 4 MG tablet  Every 6 hours     12/09/17 1950       Ivery Quale, Cordelia Poche 12/10/17 2034    Donnetta Hutching, MD 12/13/17 [id number]

## 2017-12-09 NOTE — Discharge Instructions (Signed)
A heating pad to your neck may be helpful.  Please follow-up with your primary physician.  Please continue your muscle relaxer.  Use Tylenol for mild pain.  May use Norco for more severe pain.  Norco and your muscle relaxer may cause drowsiness, please use these medications with caution.

## 2018-02-11 DIAGNOSIS — J449 Chronic obstructive pulmonary disease, unspecified: Secondary | ICD-10-CM | POA: Diagnosis not present

## 2018-02-11 DIAGNOSIS — I10 Essential (primary) hypertension: Secondary | ICD-10-CM | POA: Diagnosis not present

## 2018-02-11 DIAGNOSIS — F419 Anxiety disorder, unspecified: Secondary | ICD-10-CM | POA: Diagnosis not present

## 2018-02-11 DIAGNOSIS — E119 Type 2 diabetes mellitus without complications: Secondary | ICD-10-CM | POA: Diagnosis not present

## 2018-02-11 DIAGNOSIS — K219 Gastro-esophageal reflux disease without esophagitis: Secondary | ICD-10-CM | POA: Diagnosis not present

## 2018-04-27 IMAGING — DX DG CHEST 2V
2 series · 2 of 2 positions shown · non-contrast
Comparison: Portable AP view earlier this day demonstrating
retrocardiac opacity.

CLINICAL DATA: Mid abdominal and mid chest pain. Cough today.
Question pneumonia.

EXAM:
CHEST  2 VIEW

[chest pa]
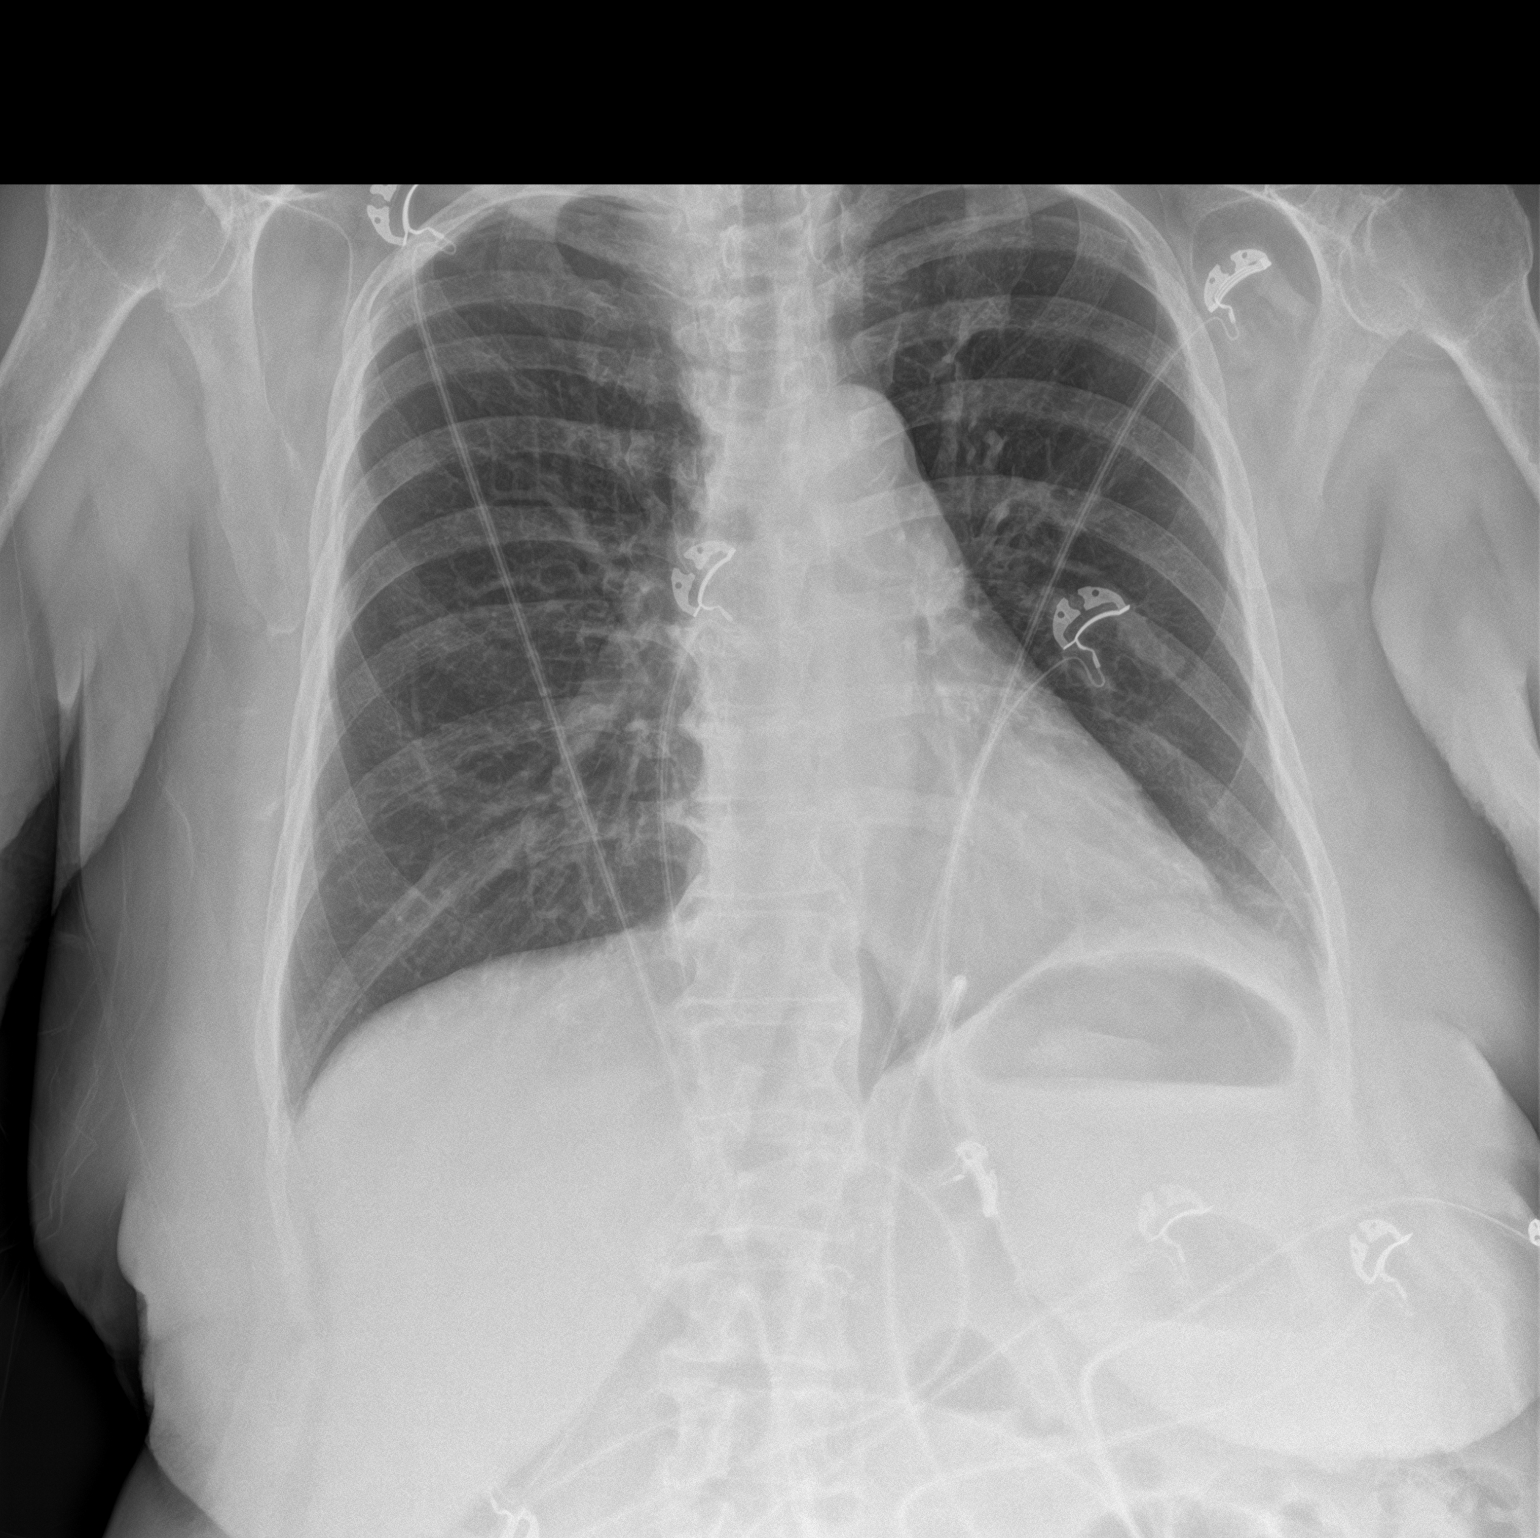

[chest lat]
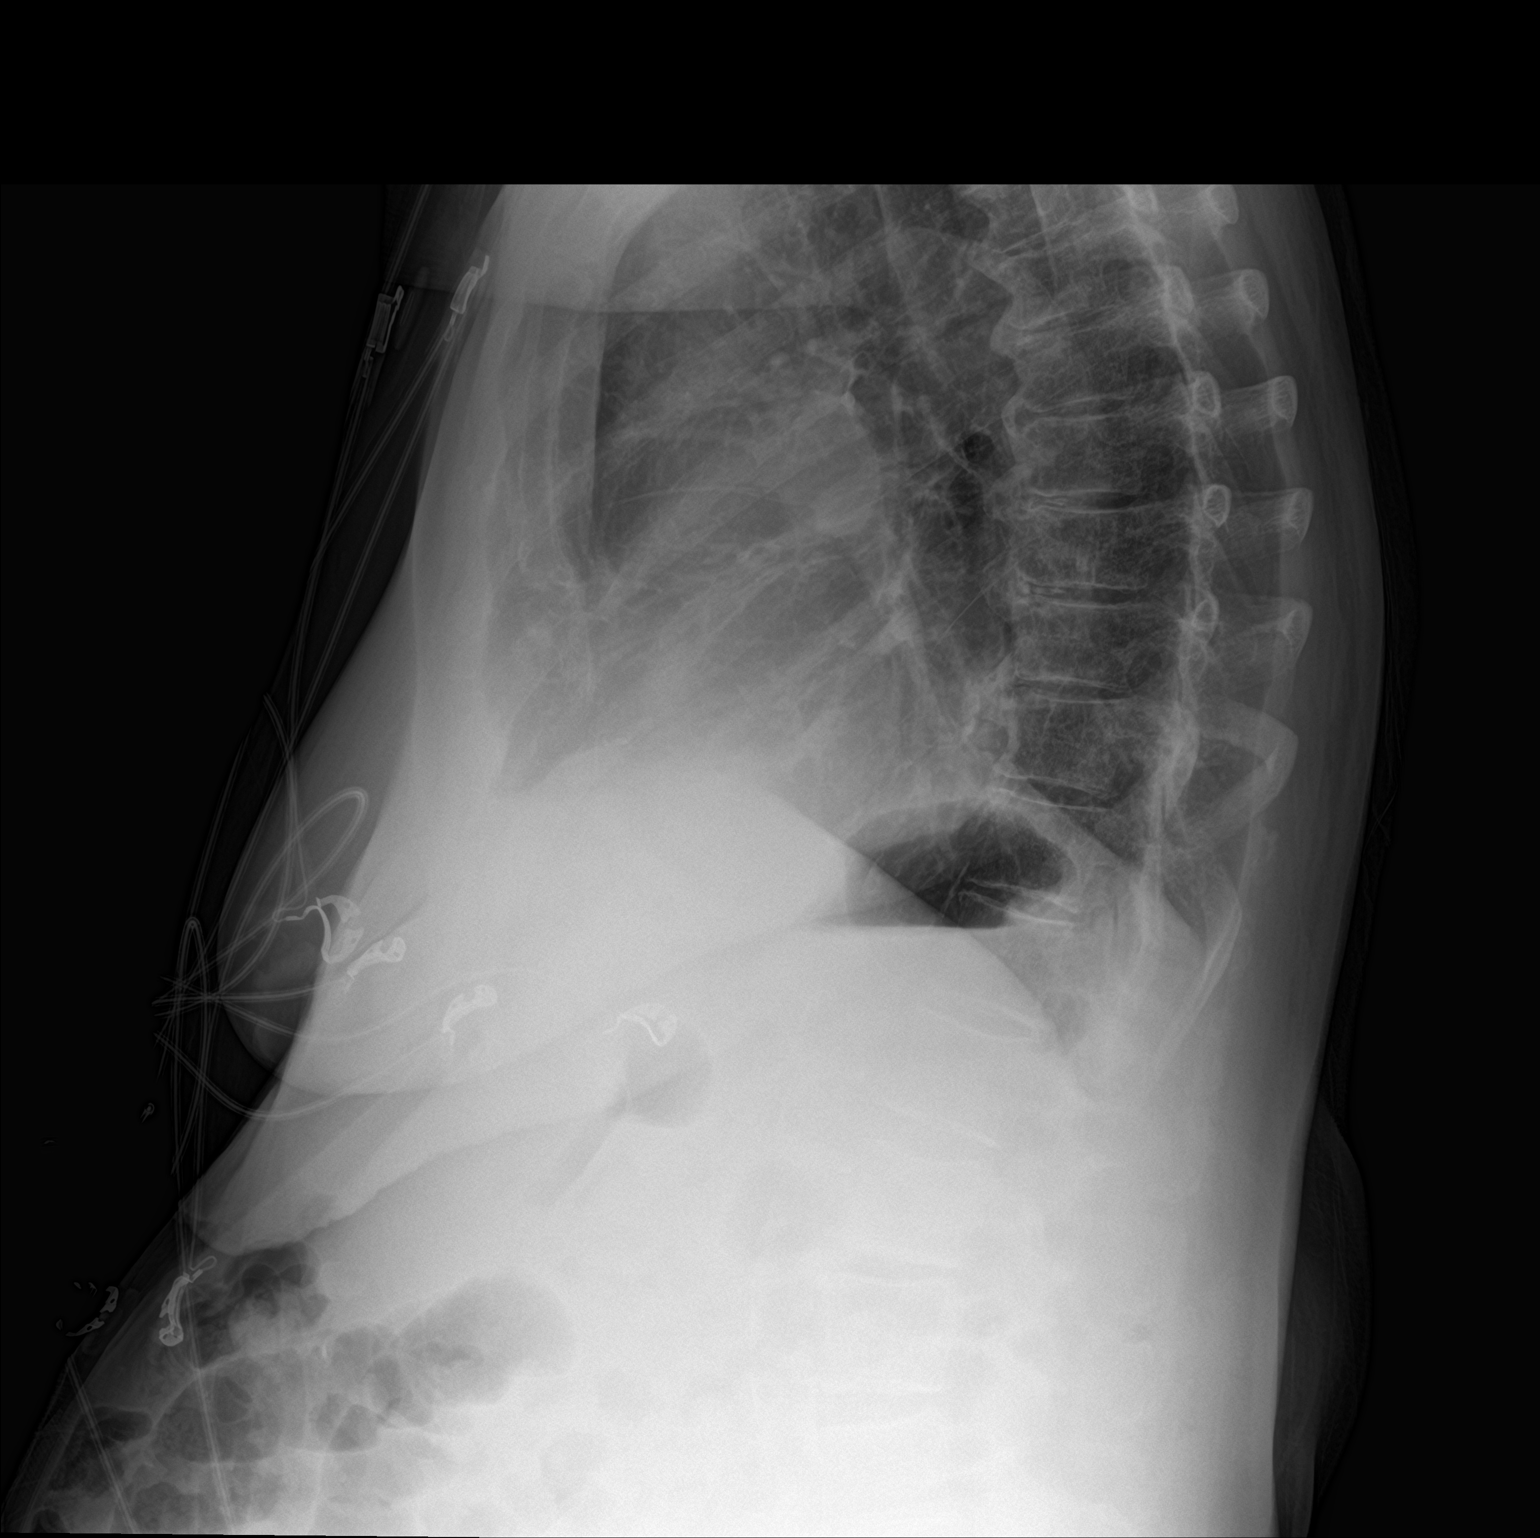

[2 of 2 positions shown; findings below may reference images not displayed]

FINDINGS: Left retrocardiac opacity on prior exam is consistent with
atelectasis or scarring with mild elevation of left hemidiaphragm.
No evidence of pneumonia. The lungs are otherwise clear. There is
atherosclerosis of the thoracic aorta. Heart is normal in size. No
pulmonary edema or pleural fluid. No pneumothorax. No acute osseous
abnormalities are seen.
IMPRESSION: Left retrocardiac opacity consistent with atelectasis or scarring.
No evidence of pneumonia.

## 2018-05-13 DIAGNOSIS — E1165 Type 2 diabetes mellitus with hyperglycemia: Secondary | ICD-10-CM | POA: Diagnosis not present

## 2018-05-13 DIAGNOSIS — J449 Chronic obstructive pulmonary disease, unspecified: Secondary | ICD-10-CM | POA: Diagnosis not present

## 2018-05-13 DIAGNOSIS — K210 Gastro-esophageal reflux disease with esophagitis: Secondary | ICD-10-CM | POA: Diagnosis not present

## 2018-05-13 DIAGNOSIS — K469 Unspecified abdominal hernia without obstruction or gangrene: Secondary | ICD-10-CM | POA: Diagnosis not present

## 2018-06-21 DIAGNOSIS — E1151 Type 2 diabetes mellitus with diabetic peripheral angiopathy without gangrene: Secondary | ICD-10-CM | POA: Diagnosis not present

## 2018-06-21 DIAGNOSIS — E1140 Type 2 diabetes mellitus with diabetic neuropathy, unspecified: Secondary | ICD-10-CM | POA: Diagnosis not present

## 2018-08-13 DIAGNOSIS — Z23 Encounter for immunization: Secondary | ICD-10-CM | POA: Diagnosis not present

## 2018-08-13 DIAGNOSIS — I129 Hypertensive chronic kidney disease with stage 1 through stage 4 chronic kidney disease, or unspecified chronic kidney disease: Secondary | ICD-10-CM | POA: Diagnosis not present

## 2018-08-13 DIAGNOSIS — D649 Anemia, unspecified: Secondary | ICD-10-CM | POA: Diagnosis not present

## 2018-08-13 DIAGNOSIS — E1121 Type 2 diabetes mellitus with diabetic nephropathy: Secondary | ICD-10-CM | POA: Diagnosis not present

## 2018-08-13 DIAGNOSIS — J449 Chronic obstructive pulmonary disease, unspecified: Secondary | ICD-10-CM | POA: Diagnosis not present

## 2018-08-13 LAB — MICROALBUMIN, URINE: Microalb, Ur: 0.9

## 2018-09-06 DIAGNOSIS — E1140 Type 2 diabetes mellitus with diabetic neuropathy, unspecified: Secondary | ICD-10-CM | POA: Diagnosis not present

## 2018-09-06 DIAGNOSIS — E1151 Type 2 diabetes mellitus with diabetic peripheral angiopathy without gangrene: Secondary | ICD-10-CM | POA: Diagnosis not present

## 2018-11-15 DIAGNOSIS — E1121 Type 2 diabetes mellitus with diabetic nephropathy: Secondary | ICD-10-CM | POA: Diagnosis not present

## 2018-11-15 DIAGNOSIS — F419 Anxiety disorder, unspecified: Secondary | ICD-10-CM | POA: Diagnosis not present

## 2018-11-15 DIAGNOSIS — J449 Chronic obstructive pulmonary disease, unspecified: Secondary | ICD-10-CM | POA: Diagnosis not present

## 2018-11-15 DIAGNOSIS — I129 Hypertensive chronic kidney disease with stage 1 through stage 4 chronic kidney disease, or unspecified chronic kidney disease: Secondary | ICD-10-CM | POA: Diagnosis not present

## 2018-11-29 DIAGNOSIS — E1140 Type 2 diabetes mellitus with diabetic neuropathy, unspecified: Secondary | ICD-10-CM | POA: Diagnosis not present

## 2018-11-29 DIAGNOSIS — E1151 Type 2 diabetes mellitus with diabetic peripheral angiopathy without gangrene: Secondary | ICD-10-CM | POA: Diagnosis not present

## 2019-02-14 DIAGNOSIS — J449 Chronic obstructive pulmonary disease, unspecified: Secondary | ICD-10-CM | POA: Diagnosis not present

## 2019-02-14 DIAGNOSIS — K469 Unspecified abdominal hernia without obstruction or gangrene: Secondary | ICD-10-CM | POA: Diagnosis not present

## 2019-02-14 DIAGNOSIS — I10 Essential (primary) hypertension: Secondary | ICD-10-CM | POA: Diagnosis not present

## 2019-02-14 DIAGNOSIS — E1121 Type 2 diabetes mellitus with diabetic nephropathy: Secondary | ICD-10-CM | POA: Diagnosis not present

## 2019-02-21 DIAGNOSIS — E1151 Type 2 diabetes mellitus with diabetic peripheral angiopathy without gangrene: Secondary | ICD-10-CM | POA: Diagnosis not present

## 2019-02-21 DIAGNOSIS — E1140 Type 2 diabetes mellitus with diabetic neuropathy, unspecified: Secondary | ICD-10-CM | POA: Diagnosis not present

## 2019-05-16 DIAGNOSIS — E1140 Type 2 diabetes mellitus with diabetic neuropathy, unspecified: Secondary | ICD-10-CM | POA: Diagnosis not present

## 2019-05-16 DIAGNOSIS — E1151 Type 2 diabetes mellitus with diabetic peripheral angiopathy without gangrene: Secondary | ICD-10-CM | POA: Diagnosis not present

## 2019-05-17 DIAGNOSIS — I129 Hypertensive chronic kidney disease with stage 1 through stage 4 chronic kidney disease, or unspecified chronic kidney disease: Secondary | ICD-10-CM | POA: Diagnosis not present

## 2019-05-17 DIAGNOSIS — E1121 Type 2 diabetes mellitus with diabetic nephropathy: Secondary | ICD-10-CM | POA: Diagnosis not present

## 2019-05-17 DIAGNOSIS — F419 Anxiety disorder, unspecified: Secondary | ICD-10-CM | POA: Diagnosis not present

## 2019-05-17 DIAGNOSIS — J449 Chronic obstructive pulmonary disease, unspecified: Secondary | ICD-10-CM | POA: Diagnosis not present

## 2019-05-19 DIAGNOSIS — I129 Hypertensive chronic kidney disease with stage 1 through stage 4 chronic kidney disease, or unspecified chronic kidney disease: Secondary | ICD-10-CM | POA: Diagnosis not present

## 2019-05-19 DIAGNOSIS — F419 Anxiety disorder, unspecified: Secondary | ICD-10-CM | POA: Diagnosis not present

## 2019-05-19 DIAGNOSIS — E1121 Type 2 diabetes mellitus with diabetic nephropathy: Secondary | ICD-10-CM | POA: Diagnosis not present

## 2019-05-19 DIAGNOSIS — J449 Chronic obstructive pulmonary disease, unspecified: Secondary | ICD-10-CM | POA: Diagnosis not present

## 2019-05-19 LAB — CBC AND DIFFERENTIAL
HCT: 34 — AB (ref 36–46)
Hemoglobin: 11.5 — AB (ref 12.0–16.0)
Neutrophils Absolute: 4451
Platelets: 263 (ref 150–399)
WBC: 6.9

## 2019-05-19 LAB — CBC: RBC: 3.67 — AB (ref 3.87–5.11)

## 2019-07-14 ENCOUNTER — Other Ambulatory Visit: Payer: Self-pay

## 2019-08-01 DIAGNOSIS — E1140 Type 2 diabetes mellitus with diabetic neuropathy, unspecified: Secondary | ICD-10-CM | POA: Diagnosis not present

## 2019-08-01 DIAGNOSIS — E1151 Type 2 diabetes mellitus with diabetic peripheral angiopathy without gangrene: Secondary | ICD-10-CM | POA: Diagnosis not present

## 2019-08-18 DIAGNOSIS — Z23 Encounter for immunization: Secondary | ICD-10-CM | POA: Diagnosis not present

## 2019-08-18 DIAGNOSIS — E785 Hyperlipidemia, unspecified: Secondary | ICD-10-CM | POA: Diagnosis not present

## 2019-08-18 DIAGNOSIS — I129 Hypertensive chronic kidney disease with stage 1 through stage 4 chronic kidney disease, or unspecified chronic kidney disease: Secondary | ICD-10-CM | POA: Diagnosis not present

## 2019-08-18 DIAGNOSIS — E1121 Type 2 diabetes mellitus with diabetic nephropathy: Secondary | ICD-10-CM | POA: Diagnosis not present

## 2019-08-18 DIAGNOSIS — J449 Chronic obstructive pulmonary disease, unspecified: Secondary | ICD-10-CM | POA: Diagnosis not present

## 2019-10-17 DIAGNOSIS — E1151 Type 2 diabetes mellitus with diabetic peripheral angiopathy without gangrene: Secondary | ICD-10-CM | POA: Diagnosis not present

## 2019-10-17 DIAGNOSIS — E1140 Type 2 diabetes mellitus with diabetic neuropathy, unspecified: Secondary | ICD-10-CM | POA: Diagnosis not present

## 2019-11-17 DIAGNOSIS — F419 Anxiety disorder, unspecified: Secondary | ICD-10-CM | POA: Diagnosis not present

## 2019-11-17 DIAGNOSIS — E1121 Type 2 diabetes mellitus with diabetic nephropathy: Secondary | ICD-10-CM | POA: Diagnosis not present

## 2019-11-17 DIAGNOSIS — K469 Unspecified abdominal hernia without obstruction or gangrene: Secondary | ICD-10-CM | POA: Diagnosis not present

## 2019-11-17 DIAGNOSIS — J449 Chronic obstructive pulmonary disease, unspecified: Secondary | ICD-10-CM | POA: Diagnosis not present

## 2019-11-17 LAB — COMPREHENSIVE METABOLIC PANEL
Albumin: 4.3 (ref 3.5–5.0)
Calcium: 9.2 (ref 8.7–10.7)
GFR calc Af Amer: 46
GFR calc non Af Amer: 40
Globulin: 2.6

## 2019-11-17 LAB — BASIC METABOLIC PANEL
BUN: 25 — AB (ref 4–21)
CO2: 22 (ref 13–22)
Chloride: 107 (ref 99–108)
Creatinine: 1.3 — AB (ref 0.5–1.1)
Glucose: 147
Potassium: 5 (ref 3.4–5.3)
Sodium: 139 (ref 137–147)

## 2019-11-17 LAB — LIPID PANEL
Cholesterol: 215 — AB (ref 0–200)
HDL: 38 (ref 35–70)
LDL Cholesterol: 135
Triglycerides: 297 — AB (ref 40–160)

## 2019-11-17 LAB — HEPATIC FUNCTION PANEL
ALT: 15 (ref 7–35)
AST: 16 (ref 13–35)
Alkaline Phosphatase: 42 (ref 25–125)

## 2019-11-17 LAB — TSH: TSH: 2.02 (ref <=5.90)

## 2019-11-17 LAB — HEMOGLOBIN A1C: Hemoglobin A1C: 6.9

## 2019-11-27 DIAGNOSIS — N1832 Chronic kidney disease, stage 3b: Secondary | ICD-10-CM | POA: Insufficient documentation

## 2019-11-27 DIAGNOSIS — E1165 Type 2 diabetes mellitus with hyperglycemia: Secondary | ICD-10-CM | POA: Insufficient documentation

## 2019-11-27 DIAGNOSIS — I119 Hypertensive heart disease without heart failure: Secondary | ICD-10-CM | POA: Insufficient documentation

## 2019-11-27 DIAGNOSIS — I2510 Atherosclerotic heart disease of native coronary artery without angina pectoris: Secondary | ICD-10-CM | POA: Insufficient documentation

## 2019-11-27 DIAGNOSIS — F329 Major depressive disorder, single episode, unspecified: Secondary | ICD-10-CM | POA: Insufficient documentation

## 2019-11-27 DIAGNOSIS — K2100 Gastro-esophageal reflux disease with esophagitis, without bleeding: Secondary | ICD-10-CM | POA: Insufficient documentation

## 2019-11-27 DIAGNOSIS — I129 Hypertensive chronic kidney disease with stage 1 through stage 4 chronic kidney disease, or unspecified chronic kidney disease: Secondary | ICD-10-CM

## 2019-11-27 DIAGNOSIS — J449 Chronic obstructive pulmonary disease, unspecified: Secondary | ICD-10-CM | POA: Insufficient documentation

## 2019-11-27 DIAGNOSIS — D649 Anemia, unspecified: Secondary | ICD-10-CM | POA: Insufficient documentation

## 2019-11-27 DIAGNOSIS — E1121 Type 2 diabetes mellitus with diabetic nephropathy: Secondary | ICD-10-CM | POA: Insufficient documentation

## 2019-11-27 DIAGNOSIS — F419 Anxiety disorder, unspecified: Secondary | ICD-10-CM | POA: Insufficient documentation

## 2019-11-27 DIAGNOSIS — M1990 Unspecified osteoarthritis, unspecified site: Secondary | ICD-10-CM | POA: Insufficient documentation

## 2019-11-27 DIAGNOSIS — K469 Unspecified abdominal hernia without obstruction or gangrene: Secondary | ICD-10-CM | POA: Insufficient documentation

## 2020-01-09 DIAGNOSIS — E1140 Type 2 diabetes mellitus with diabetic neuropathy, unspecified: Secondary | ICD-10-CM | POA: Diagnosis not present

## 2020-01-09 DIAGNOSIS — E1151 Type 2 diabetes mellitus with diabetic peripheral angiopathy without gangrene: Secondary | ICD-10-CM | POA: Diagnosis not present

## 2020-03-07 ENCOUNTER — Encounter: Payer: Self-pay | Admitting: Family Medicine

## 2020-03-07 ENCOUNTER — Ambulatory Visit (INDEPENDENT_AMBULATORY_CARE_PROVIDER_SITE_OTHER): Payer: Medicare Other | Admitting: Family Medicine

## 2020-03-07 ENCOUNTER — Other Ambulatory Visit: Payer: Self-pay

## 2020-03-07 VITALS — BP 130/70 | HR 95 | Temp 97.6°F | Ht 59.0 in | Wt 157.8 lb

## 2020-03-07 DIAGNOSIS — I2510 Atherosclerotic heart disease of native coronary artery without angina pectoris: Secondary | ICD-10-CM

## 2020-03-07 DIAGNOSIS — K219 Gastro-esophageal reflux disease without esophagitis: Secondary | ICD-10-CM | POA: Diagnosis not present

## 2020-03-07 DIAGNOSIS — E1165 Type 2 diabetes mellitus with hyperglycemia: Secondary | ICD-10-CM | POA: Diagnosis not present

## 2020-03-07 NOTE — Patient Instructions (Addendum)
   Fasting labwork If you have lab work done today you will be contacted with your lab results within the next 2 weeks.  If you have not heard from us then please contact us. The fastest way to get your results is to register for My Chart.   IF you received an x-ray today, you will receive an invoice from [facility]. Please contact [facility] at [phone] with questions or concerns regarding your invoice.   IF you received labwork today, you will receive an invoice from [facility]. Please contact [facility] at [phone] with questions or concerns regarding your invoice.   Our billing staff will not be able to assist you with questions regarding bills from these companies.  You will be contacted with the lab results as soon as they are available. The fastest way to get your results is to activate your My Chart account. Instructions are located on the last page of this paperwork. If you have not heard from us regarding the results in 2 weeks, please contact this office.      

## 2020-03-07 NOTE — Progress Notes (Signed)
New Patient Office Visit  Subjective:  Patient ID: [name] [name], female    DOB: 24-Oct-1941  Age: 79 y.o. MRN: [id number]  CC:  Chief Complaint  Patient presents with  . Establish Care    follow up from Dr Juanetta Gosling  HTN/DM/GERD  HPI [name] [name] presents for DM-Janumet-BID,  Hyperlipidemia-fish oil-omega 3 750mg -pt taking 2/day HTN-Losartan-12/ daily, asa 81mg  Vit D-1000IU Past Medical History:  Diagnosis Date  . Anemia, unspecified   . Anxiety disorder, unspecified   . Atherosclerotic heart disease of native coronary artery without angina pectoris   . Chronic obstructive pulmonary disease, unspecified (HCC)   . Diabetes mellitus   . Gastro-esophageal reflux disease with esophagitis   . Heart disease   . Hypertension   . Hypertensive chronic kidney disease with stage 1 through stage 4 chronic kidney disease, or unspecified chronic kidney disease   . Hypertensive heart disease without heart failure   . Major depressive disorder, single episode, unspecified   . Pneumonia   . Renal disorder   . Shortness of breath   . Type 2 diabetes mellitus with diabetic nephropathy (HCC)   . Type 2 diabetes mellitus with hyperglycemia (HCC)   . Unspecified abdominal hernia without obstruction or gangrene   . Unspecified osteoarthritis, unspecified site     Past Surgical History:  Procedure Laterality Date  . NEPHRECTOMY    . VENTRAL HERNIA REPAIR  09/01/2017   mistaken entry    No family history on file.  Social History   Socioeconomic History  . Marital status: Widowed    Spouse name: Not on file  . Number of children: Not on file  . Years of education: Not on file  . Highest education level: Not on file  Occupational History  . Not on file  Tobacco Use  . Smoking status: Never Smoker  . Smokeless tobacco: Never Used  Substance and Sexual Activity  . Alcohol use: No  . Drug use: No  . Sexual activity: Never  Other Topics Concern  . Not on file  Social History  Narrative  . Not on file   Social Determinants of Health   Financial Resource Strain:   . Difficulty of Paying Living Expenses:   Food Insecurity:   . Worried About in the Last Year:   . 09/03/2017 in the Last Year:   Transportation Needs:   . Programme researcher, broadcasting/film/video (Medical):   Barista Lack of Transportation (Non-Medical):   Physical Activity:   . Days of Exercise per Week:   . Minutes of Exercise per Session:   Stress:   . Feeling of Stress :   Social Connections:   . Frequency of Communication with Friends and Family:   . Frequency of Social Gatherings with Friends and Family:   . Attends Religious Services:   . Active Member of Clubs or Organizations:   . Attends Freight forwarder Meetings:   Marland Kitchen Marital Status:   Intimate Partner Violence:   . Fear of Current or Ex-Partner:   . Emotionally Abused:   Banker Physically Abused:   . Sexually Abused:     ROS Review of Systems  Constitutional: Negative.   Eyes:       Reading glasses  Respiratory: Negative.   Cardiovascular: Negative.   Gastrointestinal: Negative.   Endocrine:       DM  Genitourinary:       Pt had a kidney removed-pt states the kidney was sold  Allergic/Immunologic: Positive for environmental allergies.  Hematological: Negative.   Psychiatric/Behavioral: Negative.     Objective:   Today's Vitals: BP 130/70 (BP Location: Right Arm, Patient Position: Sitting, Cuff Size: Normal)   Pulse 95   Temp 97.6 F (36.4 C) (Temporal)   Ht 4\' 11"  (1.499 m)   Wt 157 lb 12.8 oz (71.6 kg)   SpO2 98%   BMI 31.87 kg/m   Physical Exam Constitutional:      Appearance: Normal appearance.  HENT:     Head: Normocephalic and atraumatic.  Cardiovascular:     Rate and Rhythm: Normal rate and regular rhythm.     Pulses: Normal pulses.     Heart sounds: Normal heart sounds.  Neurological:     Mental Status: She is alert and oriented to person, place, and time.  Psychiatric:        Mood and  Affect: Mood normal.        Behavior: Behavior normal.     Assessment & Plan:  1. Atherosclerosis of native coronary artery without angina pectoris, unspecified whether native or transplanted heart Asa daily 2. Gastroesophageal reflux disease without esophagitis Stable-diet modification 3. Type 2 diabetes mellitus with hyperglycemia, without long-term current use of insulin (HCC) Janumet-BID-stable - Lipid panel - COMPLETE METABOLIC PANEL WITH GFR - Hemoglobin A1c - Microalbumin, urine  Outpatient Encounter Medications as of 03/07/2020  Medication Sig  . acetaminophen (TYLENOL) 325 MG tablet Take 650 mg by mouth every 6 (six) hours as needed for moderate pain.  Marland Kitchen aspirin 81 MG tablet Take 81 mg by mouth every evening.   . calcium-vitamin D (OSCAL WITH D) 500-200 MG-UNIT tablet Take 1 tablet by mouth.  . cetirizine (ZYRTEC) 10 MG tablet Take 10 mg by mouth daily.  . fish oil-omega-3 fatty acids 1000 MG capsule Take 1 g by mouth 2 (two) times daily.  Marland Kitchen JANUMET 50-1000 MG tablet Take 1 tablet by mouth 2 (two) times daily.  Javier Docker Oil 1000 MG CAPS Take by mouth.  . losartan (COZAAR) 50 MG tablet Take 25 mg by mouth daily.  . Multiple Vitamin (MULITIVITAMIN WITH MINERALS) TABS Take 1 tablet by mouth daily.  . Multiple Vitamins-Minerals (HAIR SKIN AND NAILS FORMULA) TABS Take by mouth.  . nystatin (MYCOSTATIN/NYSTOP) powder Apply topically 3 (three) times daily. (Patient taking differently: Apply 1 g topically daily as needed (infection). )  . vitamin E 1000 UNIT capsule Take 1,000 Units by mouth daily.  . [DISCONTINUED] aspirin-sod bicarb-citric acid (ALKA-SELTZER) 325 MG TBEF tablet Take 325 mg by mouth every 6 (six) hours as needed (stomach).  . [DISCONTINUED] baclofen (LIORESAL) 10 MG tablet Take 10 mg by mouth 3 (three) times daily as needed for muscle spasms.  . [DISCONTINUED] HYDROcodone-acetaminophen (NORCO/VICODIN) 5-325 MG tablet Take 1 tablet by mouth every 6 (six) hours as  needed.  . [DISCONTINUED] ketoconazole (NIZORAL) 2 % cream Apply 1 application topically daily.  . [DISCONTINUED] methocarbamol (ROBAXIN) 750 MG tablet Take 1 tablet (750 mg total) by mouth 4 (four) times daily as needed (use for muscle cramps/pain).  . [DISCONTINUED] metroNIDAZOLE (METROGEL) 0.75 % gel Apply 1 application topically 2 (two) times daily.  . [DISCONTINUED] ondansetron (ZOFRAN ODT) 4 MG disintegrating tablet 4mg  ODT q4 hours prn nausea/vomit  . [DISCONTINUED] ondansetron (ZOFRAN ODT) 4 MG disintegrating tablet 4mg  ODT q4 hours prn nausea/vomit  . [DISCONTINUED] ondansetron (ZOFRAN) 4 MG tablet Take 1 tablet (4 mg total) by mouth every 6 (six) hours.  . [DISCONTINUED] oxyCODONE (OXY IR/ROXICODONE) 5  MG immediate release tablet Take 1-2 tablets (5-10 mg total) by mouth every 6 (six) hours as needed for moderate pain, severe pain or breakthrough pain.   No facility-administered encounter medications on file as of 03/07/2020.    Follow-up: fasting labwork  Tashona Calk Mat Carne, MD

## 2020-03-08 ENCOUNTER — Encounter: Payer: Self-pay | Admitting: Emergency Medicine

## 2020-03-08 LAB — COMPLETE METABOLIC PANEL WITH GFR
AG Ratio: 1.5 (calc) (ref 1.0–2.5)
ALT: 16 U/L (ref 6–29)
AST: 15 U/L (ref 10–35)
Albumin: 4.2 g/dL (ref 3.6–5.1)
Alkaline phosphatase (APISO): 49 U/L (ref 37–153)
BUN/Creatinine Ratio: 16 (calc) (ref 6–22)
BUN: 21 mg/dL (ref 7–25)
CO2: 27 mmol/L (ref 20–32)
Calcium: 9.1 mg/dL (ref 8.6–10.4)
Chloride: 106 mmol/L (ref 98–110)
Creat: 1.28 mg/dL — ABNORMAL HIGH (ref 0.60–0.93)
GFR, Est African American: 46 mL/min/{1.73_m2} — ABNORMAL LOW (ref >=60)
GFR, Est Non African American: 40 mL/min/{1.73_m2} — ABNORMAL LOW (ref >=60)
Globulin: 2.8 g/dL (calc) (ref 1.9–3.7)
Glucose, Bld: 167 mg/dL — ABNORMAL HIGH (ref 65–99)
Potassium: 5.3 mmol/L (ref 3.5–5.3)
Sodium: 139 mmol/L (ref 135–146)
Total Bilirubin: 0.3 mg/dL (ref 0.2–1.2)
Total Protein: 7 g/dL (ref 6.1–8.1)

## 2020-03-08 LAB — LIPID PANEL
Cholesterol: 182 mg/dL (ref <200)
HDL: 38 mg/dL — ABNORMAL LOW (ref >=50)
LDL Cholesterol (Calc): 109 mg/dL (calc) — ABNORMAL HIGH
Non-HDL Cholesterol (Calc): 144 mg/dL (calc) — ABNORMAL HIGH (ref <130)
Total CHOL/HDL Ratio: 4.8 (calc) (ref <5.0)
Triglycerides: 234 mg/dL — ABNORMAL HIGH (ref <150)

## 2020-03-08 LAB — MICROALBUMIN, URINE: Microalb, Ur: 2.4 mg/dL

## 2020-03-08 LAB — HEMOGLOBIN A1C
Hgb A1c MFr Bld: 6.8 % of total Hgb — ABNORMAL HIGH (ref <5.7)
Mean Plasma Glucose: 148 (calc)
eAG (mmol/L): 8.2 (calc)

## 2020-03-26 DIAGNOSIS — E1140 Type 2 diabetes mellitus with diabetic neuropathy, unspecified: Secondary | ICD-10-CM | POA: Diagnosis not present

## 2020-03-26 DIAGNOSIS — E1151 Type 2 diabetes mellitus with diabetic peripheral angiopathy without gangrene: Secondary | ICD-10-CM | POA: Diagnosis not present

## 2020-05-17 ENCOUNTER — Inpatient Hospital Stay (HOSPITAL_COMMUNITY)
Admission: EM | Admit: 2020-05-17 | Discharge: 2020-06-01 | DRG: 233 | Disposition: A | Discharge status: Skilled Nursing Facility | Payer: Medicare Other | Attending: Thoracic Surgery (Cardiothoracic Vascular Surgery) | Admitting: Thoracic Surgery (Cardiothoracic Vascular Surgery)

## 2020-05-17 ENCOUNTER — Emergency Department (HOSPITAL_COMMUNITY): Payer: Medicare Other

## 2020-05-17 ENCOUNTER — Encounter (HOSPITAL_COMMUNITY): Payer: Self-pay

## 2020-05-17 ENCOUNTER — Other Ambulatory Visit: Payer: Self-pay

## 2020-05-17 DIAGNOSIS — Z905 Acquired absence of kidney: Secondary | ICD-10-CM | POA: Diagnosis not present

## 2020-05-17 DIAGNOSIS — D62 Acute posthemorrhagic anemia: Secondary | ICD-10-CM | POA: Diagnosis not present

## 2020-05-17 DIAGNOSIS — I252 Old myocardial infarction: Secondary | ICD-10-CM

## 2020-05-17 DIAGNOSIS — R072 Precordial pain: Secondary | ICD-10-CM | POA: Diagnosis not present

## 2020-05-17 DIAGNOSIS — I959 Hypotension, unspecified: Secondary | ICD-10-CM | POA: Diagnosis not present

## 2020-05-17 DIAGNOSIS — R079 Chest pain, unspecified: Secondary | ICD-10-CM | POA: Diagnosis present

## 2020-05-17 DIAGNOSIS — Z7401 Bed confinement status: Secondary | ICD-10-CM | POA: Diagnosis not present

## 2020-05-17 DIAGNOSIS — J189 Pneumonia, unspecified organism: Secondary | ICD-10-CM | POA: Diagnosis present

## 2020-05-17 DIAGNOSIS — F329 Major depressive disorder, single episode, unspecified: Secondary | ICD-10-CM | POA: Diagnosis present

## 2020-05-17 DIAGNOSIS — I129 Hypertensive chronic kidney disease with stage 1 through stage 4 chronic kidney disease, or unspecified chronic kidney disease: Secondary | ICD-10-CM | POA: Diagnosis present

## 2020-05-17 DIAGNOSIS — Z91041 Radiographic dye allergy status: Secondary | ICD-10-CM | POA: Diagnosis not present

## 2020-05-17 DIAGNOSIS — R9439 Abnormal result of other cardiovascular function study: Secondary | ICD-10-CM | POA: Diagnosis not present

## 2020-05-17 DIAGNOSIS — I2510 Atherosclerotic heart disease of native coronary artery without angina pectoris: Secondary | ICD-10-CM | POA: Diagnosis not present

## 2020-05-17 DIAGNOSIS — I25119 Atherosclerotic heart disease of native coronary artery with unspecified angina pectoris: Secondary | ICD-10-CM | POA: Diagnosis not present

## 2020-05-17 DIAGNOSIS — Z951 Presence of aortocoronary bypass graft: Secondary | ICD-10-CM | POA: Diagnosis not present

## 2020-05-17 DIAGNOSIS — R0902 Hypoxemia: Secondary | ICD-10-CM | POA: Diagnosis not present

## 2020-05-17 DIAGNOSIS — Z7982 Long term (current) use of aspirin: Secondary | ICD-10-CM

## 2020-05-17 DIAGNOSIS — G8929 Other chronic pain: Secondary | ICD-10-CM | POA: Diagnosis present

## 2020-05-17 DIAGNOSIS — E1122 Type 2 diabetes mellitus with diabetic chronic kidney disease: Secondary | ICD-10-CM | POA: Diagnosis present

## 2020-05-17 DIAGNOSIS — N179 Acute kidney failure, unspecified: Secondary | ICD-10-CM | POA: Diagnosis present

## 2020-05-17 DIAGNOSIS — Z8249 Family history of ischemic heart disease and other diseases of the circulatory system: Secondary | ICD-10-CM | POA: Diagnosis not present

## 2020-05-17 DIAGNOSIS — R0789 Other chest pain: Secondary | ICD-10-CM

## 2020-05-17 DIAGNOSIS — M6281 Muscle weakness (generalized): Secondary | ICD-10-CM | POA: Diagnosis not present

## 2020-05-17 DIAGNOSIS — J449 Chronic obstructive pulmonary disease, unspecified: Secondary | ICD-10-CM | POA: Diagnosis not present

## 2020-05-17 DIAGNOSIS — J440 Chronic obstructive pulmonary disease with acute lower respiratory infection: Secondary | ICD-10-CM | POA: Diagnosis present

## 2020-05-17 DIAGNOSIS — E1165 Type 2 diabetes mellitus with hyperglycemia: Secondary | ICD-10-CM | POA: Diagnosis not present

## 2020-05-17 DIAGNOSIS — E119 Type 2 diabetes mellitus without complications: Secondary | ICD-10-CM

## 2020-05-17 DIAGNOSIS — Z20822 Contact with and (suspected) exposure to covid-19: Secondary | ICD-10-CM | POA: Diagnosis present

## 2020-05-17 DIAGNOSIS — K439 Ventral hernia without obstruction or gangrene: Secondary | ICD-10-CM | POA: Diagnosis present

## 2020-05-17 DIAGNOSIS — E785 Hyperlipidemia, unspecified: Secondary | ICD-10-CM | POA: Diagnosis not present

## 2020-05-17 DIAGNOSIS — K5900 Constipation, unspecified: Secondary | ICD-10-CM | POA: Diagnosis not present

## 2020-05-17 DIAGNOSIS — Z79899 Other long term (current) drug therapy: Secondary | ICD-10-CM | POA: Diagnosis not present

## 2020-05-17 DIAGNOSIS — Z7984 Long term (current) use of oral hypoglycemic drugs: Secondary | ICD-10-CM | POA: Diagnosis not present

## 2020-05-17 DIAGNOSIS — N1832 Chronic kidney disease, stage 3b: Secondary | ICD-10-CM | POA: Diagnosis present

## 2020-05-17 DIAGNOSIS — E669 Obesity, unspecified: Secondary | ICD-10-CM | POA: Diagnosis not present

## 2020-05-17 DIAGNOSIS — J90 Pleural effusion, not elsewhere classified: Secondary | ICD-10-CM | POA: Diagnosis not present

## 2020-05-17 DIAGNOSIS — R0602 Shortness of breath: Secondary | ICD-10-CM | POA: Diagnosis not present

## 2020-05-17 DIAGNOSIS — R279 Unspecified lack of coordination: Secondary | ICD-10-CM | POA: Diagnosis not present

## 2020-05-17 DIAGNOSIS — R231 Pallor: Secondary | ICD-10-CM | POA: Diagnosis not present

## 2020-05-17 DIAGNOSIS — N183 Chronic kidney disease, stage 3 unspecified: Secondary | ICD-10-CM | POA: Diagnosis not present

## 2020-05-17 DIAGNOSIS — Z48812 Encounter for surgical aftercare following surgery on the circulatory system: Secondary | ICD-10-CM | POA: Diagnosis not present

## 2020-05-17 DIAGNOSIS — Z4659 Encounter for fitting and adjustment of other gastrointestinal appliance and device: Secondary | ICD-10-CM

## 2020-05-17 DIAGNOSIS — I469 Cardiac arrest, cause unspecified: Secondary | ICD-10-CM | POA: Diagnosis not present

## 2020-05-17 DIAGNOSIS — I25110 Atherosclerotic heart disease of native coronary artery with unstable angina pectoris: Principal | ICD-10-CM | POA: Diagnosis present

## 2020-05-17 DIAGNOSIS — J9811 Atelectasis: Secondary | ICD-10-CM | POA: Diagnosis present

## 2020-05-17 DIAGNOSIS — I119 Hypertensive heart disease without heart failure: Secondary | ICD-10-CM | POA: Diagnosis not present

## 2020-05-17 DIAGNOSIS — R41841 Cognitive communication deficit: Secondary | ICD-10-CM | POA: Diagnosis not present

## 2020-05-17 DIAGNOSIS — Z4682 Encounter for fitting and adjustment of non-vascular catheter: Secondary | ICD-10-CM | POA: Diagnosis not present

## 2020-05-17 DIAGNOSIS — F419 Anxiety disorder, unspecified: Secondary | ICD-10-CM | POA: Diagnosis not present

## 2020-05-17 DIAGNOSIS — R2689 Other abnormalities of gait and mobility: Secondary | ICD-10-CM | POA: Diagnosis not present

## 2020-05-17 DIAGNOSIS — Z01810 Encounter for preprocedural cardiovascular examination: Secondary | ICD-10-CM | POA: Diagnosis not present

## 2020-05-17 DIAGNOSIS — I10 Essential (primary) hypertension: Secondary | ICD-10-CM | POA: Diagnosis not present

## 2020-05-17 LAB — HEPATIC FUNCTION PANEL
ALT: 18 U/L (ref 0–44)
AST: 17 U/L (ref 15–41)
Albumin: 3.8 g/dL (ref 3.5–5.0)
Alkaline Phosphatase: 48 U/L (ref 38–126)
Bilirubin, Direct: <0.1 mg/dL (ref 0.0–0.2)
Total Bilirubin: 0.4 mg/dL (ref 0.3–1.2)
Total Protein: 7 g/dL (ref 6.5–8.1)

## 2020-05-17 LAB — CBC WITH DIFFERENTIAL/PLATELET
Abs Immature Granulocytes: 0.03 10*3/uL (ref 0.00–0.07)
Basophils Absolute: 0.1 10*3/uL (ref 0.0–0.1)
Basophils Relative: 1 %
Eosinophils Absolute: 0.2 10*3/uL (ref 0.0–0.5)
Eosinophils Relative: 2 %
HCT: 34.2 % — ABNORMAL LOW (ref 36.0–46.0)
Hemoglobin: 11.3 g/dL — ABNORMAL LOW (ref 12.0–15.0)
Immature Granulocytes: 0 %
Lymphocytes Relative: 19 %
Lymphs Abs: 1.6 10*3/uL (ref 0.7–4.0)
MCH: 31.6 pg (ref 26.0–34.0)
MCHC: 33 g/dL (ref 30.0–36.0)
MCV: 95.5 fL (ref 80.0–100.0)
Monocytes Absolute: 0.5 10*3/uL (ref 0.1–1.0)
Monocytes Relative: 6 %
Neutro Abs: 6 10*3/uL (ref 1.7–7.7)
Neutrophils Relative %: 72 %
Platelets: 264 10*3/uL (ref 150–400)
RBC: 3.58 MIL/uL — ABNORMAL LOW (ref 3.87–5.11)
RDW: 12.1 % (ref 11.5–15.5)
WBC: 8.4 10*3/uL (ref 4.0–10.5)
nRBC: 0 % (ref 0.0–0.2)

## 2020-05-17 LAB — TROPONIN I (HIGH SENSITIVITY)
Troponin I (High Sensitivity): 9 ng/L (ref <18)
Troponin I (High Sensitivity): 12 ng/L (ref <18)
Troponin I (High Sensitivity): 13 ng/L (ref <18)

## 2020-05-17 LAB — BASIC METABOLIC PANEL
Anion gap: 10 (ref 5–15)
BUN: 25 mg/dL — ABNORMAL HIGH (ref 8–23)
CO2: 22 mmol/L (ref 22–32)
Calcium: 9.1 mg/dL (ref 8.9–10.3)
Chloride: 105 mmol/L (ref 98–111)
Creatinine, Ser: 1.4 mg/dL — ABNORMAL HIGH (ref 0.44–1.00)
GFR calc Af Amer: 42 mL/min — ABNORMAL LOW (ref >60)
GFR calc non Af Amer: 36 mL/min — ABNORMAL LOW (ref >60)
Glucose, Bld: 121 mg/dL — ABNORMAL HIGH (ref 70–99)
Potassium: 4.4 mmol/L (ref 3.5–5.1)
Sodium: 137 mmol/L (ref 135–145)

## 2020-05-17 LAB — SARS CORONAVIRUS 2 BY RT PCR (HOSPITAL ORDER, PERFORMED IN ~~LOC~~ HOSPITAL LAB): SARS Coronavirus 2: NEGATIVE

## 2020-05-17 MED ORDER — ONDANSETRON HCL 4 MG PO TABS: 4.0000 mg | ORAL_TABLET | Freq: Four times a day (QID) | ORAL | Status: DC | PRN

## 2020-05-17 MED ORDER — SODIUM CHLORIDE 0.9 % IV BOLUS
250.0000 mL | Freq: Once | INTRAVENOUS | Status: AC
  Administered 2020-05-17: 250 mL via INTRAVENOUS

## 2020-05-17 MED ORDER — PANTOPRAZOLE SODIUM 40 MG IV SOLR
40.0000 mg | Freq: Once | INTRAVENOUS | Status: AC
  Administered 2020-05-17: 40 mg via INTRAVENOUS
  Filled 2020-05-17: qty 40

## 2020-05-17 MED ORDER — ATORVASTATIN CALCIUM 40 MG PO TABS
40.0000 mg | ORAL_TABLET | Freq: Every day | ORAL | Status: DC
  Administered 2020-05-19 – 2020-06-01 (×13): 40 mg via ORAL
  Filled 2020-05-17 (×15): qty 1

## 2020-05-17 MED ORDER — ACETAMINOPHEN 325 MG PO TABS
650.0000 mg | ORAL_TABLET | Freq: Four times a day (QID) | ORAL | Status: DC | PRN
  Administered 2020-05-20: 650 mg via ORAL
  Filled 2020-05-17: qty 2

## 2020-05-17 MED ORDER — LOSARTAN POTASSIUM 25 MG PO TABS
25.0000 mg | ORAL_TABLET | Freq: Every morning | ORAL | Status: DC
  Administered 2020-05-19: 25 mg via ORAL
  Filled 2020-05-17 (×2): qty 1

## 2020-05-17 MED ORDER — ENOXAPARIN SODIUM 40 MG/0.4ML ~~LOC~~ SOLN
40.0000 mg | Freq: Every day | SUBCUTANEOUS | Status: DC
  Administered 2020-05-18: 40 mg via SUBCUTANEOUS
  Filled 2020-05-17: qty 0.4

## 2020-05-17 MED ORDER — ONDANSETRON HCL 4 MG/2ML IJ SOLN: 4.0000 mg | Freq: Four times a day (QID) | INTRAMUSCULAR | Status: DC | PRN

## 2020-05-17 MED ORDER — ASPIRIN 325 MG PO TABS
325.0000 mg | ORAL_TABLET | Freq: Once | ORAL | Status: AC
  Administered 2020-05-18: 325 mg via ORAL
  Filled 2020-05-17: qty 1

## 2020-05-17 MED ORDER — ACETAMINOPHEN 650 MG RE SUPP: 650.0000 mg | Freq: Four times a day (QID) | RECTAL | Status: DC | PRN

## 2020-05-17 MED ORDER — ASPIRIN EC 81 MG PO TBEC
81.0000 mg | DELAYED_RELEASE_TABLET | Freq: Every evening | ORAL | Status: DC
  Administered 2020-05-18 – 2020-05-20 (×3): 81 mg via ORAL
  Filled 2020-05-17 (×3): qty 1

## 2020-05-17 MED ORDER — NITROGLYCERIN 0.4 MG SL SUBL
0.4000 mg | SUBLINGUAL_TABLET | SUBLINGUAL | Status: DC | PRN
  Administered 2020-05-17 – 2020-05-21 (×2): 0.4 mg via SUBLINGUAL
  Filled 2020-05-17 (×3): qty 1

## 2020-05-17 MED ORDER — POLYETHYLENE GLYCOL 3350 17 G PO PACK
17.0000 g | PACK | Freq: Every day | ORAL | Status: DC | PRN
  Administered 2020-05-18: 17 g via ORAL
  Filled 2020-05-17: qty 1

## 2020-05-17 MED ORDER — INSULIN ASPART 100 UNIT/ML ~~LOC~~ SOLN
0.0000 [IU] | Freq: Every day | SUBCUTANEOUS | Status: DC
  Administered 2020-05-19: 3 [IU] via SUBCUTANEOUS
  Administered 2020-05-20: 5 [IU] via SUBCUTANEOUS
  Administered 2020-05-21: 3 [IU] via SUBCUTANEOUS

## 2020-05-17 MED ORDER — INSULIN ASPART 100 UNIT/ML ~~LOC~~ SOLN
0.0000 [IU] | Freq: Three times a day (TID) | SUBCUTANEOUS | Status: DC
  Administered 2020-05-18: 3 [IU] via SUBCUTANEOUS
  Administered 2020-05-19: 5 [IU] via SUBCUTANEOUS
  Administered 2020-05-19: 2 [IU] via SUBCUTANEOUS
  Administered 2020-05-19: 1 [IU] via SUBCUTANEOUS
  Administered 2020-05-20: 3 [IU] via SUBCUTANEOUS
  Administered 2020-05-20 (×2): 2 [IU] via SUBCUTANEOUS
  Administered 2020-05-21: 3 [IU] via SUBCUTANEOUS

## 2020-05-17 NOTE — H&P (Signed)
History and Physical    [name] [name] PPI:[id number] DOB: 12/15/1941 DOA: 05/17/2020  PCP: Wandra Feinstein, MD   Patient coming from: Home  I have personally briefly reviewed patient's old medical records in [facility]  Chief Complaint: Chest pain  HPI: [name] [name] is a 79 y.o. female with medical history significant for diabetes mellitus, COPD, hypertension, CKD 3. Patient presented to the ED with complaints of central chest pain ongoing for at least 6 months, intermittent, lasting 10 to 20 minutes, described as a heaviness on her chest.  Chest pain radiates from mid chest towards left side of chest and occasionally into the right arm, occurs at rest, with activity, stretching or bending over, and resolves when she is stays still without movement.  It is associated difficulty breathing. Over time the intensity of the chest pain has increased.  Reports compliance with aspirin 81 mg daily. EMS gave 1 nitro, and chest pain resolved.  No history of cardiac disease. At baseline she denies difficulty breathing, no cough, no fever or chills.  ED Course: Heart rate mostly 70s, tachypneic, otherwise stable vitals O2 sats greater than 94% on room air.  Troponin 9 > 12, WBC 8.4.  Creatinine 1.4.  Chest x-ray shows mild left base subsegmental atelectasis/infiltrate.  EKG shows sinus rhythm with mild ST depressions in lead V5, V6, II, III aVF.  Chest pain improved with nitro given by EMS, and again in the ED.  Hospitalist called to admit for chest pain rule out ACS.  Review of Systems: As per HPI all other systems reviewed and negative.  Past Medical History:  Diagnosis Date  . Anemia, unspecified   . Anxiety disorder, unspecified   . Atherosclerotic heart disease of native coronary artery without angina pectoris   . Chronic obstructive pulmonary disease, unspecified (HCC)   . Diabetes mellitus   . Gastro-esophageal reflux disease with esophagitis   . Heart disease   . Hypertension   .  Hypertensive chronic kidney disease with stage 1 through stage 4 chronic kidney disease, or unspecified chronic kidney disease   . Hypertensive heart disease without heart failure   . Major depressive disorder, single episode, unspecified   . Pneumonia   . Renal disorder   . Shortness of breath   . Type 2 diabetes mellitus with diabetic nephropathy (HCC)   . Type 2 diabetes mellitus with hyperglycemia (HCC)   . Unspecified abdominal hernia without obstruction or gangrene   . Unspecified osteoarthritis, unspecified site     Past Surgical History:  Procedure Laterality Date  . NEPHRECTOMY    . VENTRAL HERNIA REPAIR  09/01/2017   mistaken entry     reports that she has never smoked. She has never used smokeless tobacco. She reports that she does not drink alcohol or use drugs.  Allergies  Allergen Reactions  . Contrast Media [Iodinated Diagnostic Agents]     Unknown reaction  . Morphine And Related   . Other     Pt has multiple allergies per pt but unsure of all.   . Penicillins     Has patient had a PCN reaction causing immediate rash, facial/tongue/throat swelling, SOB or lightheadedness with hypotension: Yes Has patient had a PCN reaction causing severe rash involving mucus membranes or skin necrosis: No Has patient had a PCN reaction that required hospitalization No Has patient had a PCN reaction occurring within the last 10 years: No If all of the above answers are "NO", then may proceed with Cephalosporin  use.   . Codeine Rash    Family history of coronary artery disease.  Prior to Admission medications   Medication Sig Start Date End Date Taking? Authorizing Provider  acetaminophen (TYLENOL) 325 MG tablet Take 650 mg by mouth every 6 (six) hours as needed for moderate pain.    [provider]  aspirin 81 MG tablet Take 81 mg by mouth every evening.     [provider]  calcium-vitamin D (OSCAL WITH D) 500-200 MG-UNIT tablet Take 1 tablet by mouth.     [provider]  cetirizine (ZYRTEC) 10 MG tablet Take 10 mg by mouth daily.    [provider]  fish oil-omega-3 fatty acids 1000 MG capsule Take 1 g by mouth 2 (two) times daily.    [provider]  JANUMET 50-1000 MG tablet Take 1 tablet by mouth 2 (two) times daily. 05/13/16   [provider]  Javier Docker Oil 1000 MG CAPS Take by mouth.    [provider]  losartan (COZAAR) 50 MG tablet Take 25 mg by mouth daily.    [provider]  Multiple Vitamin (MULITIVITAMIN WITH MINERALS) TABS Take 1 tablet by mouth daily.    [provider]  Multiple Vitamins-Minerals (HAIR SKIN AND NAILS FORMULA) TABS Take by mouth.    [provider]  nystatin (MYCOSTATIN/NYSTOP) powder Apply topically 3 (three) times daily. Patient taking differently: Apply 1 g topically daily as needed (infection).  07/02/17   Ripley Fraise, MD  vitamin E 1000 UNIT capsule Take 1,000 Units by mouth daily.    [provider]    Physical Exam: Vitals:   05/17/20 1700 05/17/20 1715 05/17/20 1730 05/17/20 1745  BP: (!) 125/59  121/65   Pulse: 71 73 72 68  Resp: (!) 22 (!) 24 (!) 23 (!) 25  Temp:      TempSrc:      SpO2: 97% 97% 99% 98%  Height:        Constitutional: NAD, calm, comfortable Vitals:   05/17/20 1700 05/17/20 1715 05/17/20 1730 05/17/20 1745  BP: (!) 125/59  121/65   Pulse: 71 73 72 68  Resp: (!) 22 (!) 24 (!) 23 (!) 25  Temp:      TempSrc:      SpO2: 97% 97% 99% 98%  Height:       Eyes: PERRL, lids and conjunctivae normal ENMT: Mucous membranes are moist.   Neck: normal, supple, no masses, no thyromegaly Respiratory:  Normal respiratory effort. No accessory muscle use.  Cardiovascular: Regular rate and rhythm, no murmurs / rubs / gallops. No extremity edema. 2+ pedal pulses. No carotid bruits.  Abdomen: Very large ventral hernia extending posteriorly towards right flank, sensitive, does not tolerate any form of  palpation Musculoskeletal: no clubbing / cyanosis. No joint deformity upper and lower extremities. Good ROM, no contractures. Normal muscle tone.  Skin: no rashes, lesions, ulcers. No induration Neurologic: No apparent cranial nerve abnormality, moving all extremities spontaneously. Psychiatric: Normal judgment and insight. Alert and oriented x 3. Normal mood.   Labs on Admission: I have personally reviewed following labs and imaging studies  CBC: Recent Labs  Lab 05/17/20 1538  WBC 8.4  NEUTROABS 6.0  HGB 11.3*  HCT 34.2*  MCV 95.5  PLT 784   Basic Metabolic Panel: Recent Labs  Lab 05/17/20 1538  NA 137  K 4.4  CL 105  CO2 22  GLUCOSE 121*  BUN 25*  CREATININE 1.40*  CALCIUM 9.1  GFR: CrCl cannot be calculated (Unknown ideal weight.). Liver Function Tests: Recent Labs  Lab 05/17/20 1539  AST 17  ALT 18  ALKPHOS 48  BILITOT 0.4  PROT 7.0  ALBUMIN 3.8    Radiological Exams on Admission: DG Chest Portable 1 View  Result Date: 05/17/2020 CLINICAL DATA:  Chest pain. EXAM: PORTABLE CHEST 1 VIEW COMPARISON:  07/27/2017. FINDINGS: Mediastinum hilar structures normal. Mild left base subsegmental atelectasis/infiltrate. Small left pleural effusion. No pneumothorax. Cardiomegaly. No pulmonary venous congestion. Degenerative change thoracic spine. IMPRESSION: Mild left base subsegmental atelectasis/infiltrate. Electronically Signed   By: Maisie Fus  Register   On: 05/17/2020 16:19    EKG: Independently reviewed.  Sinus rhythm, QTC 385.  Mild ST depression in lead II, III, aVF, V5 V6.  Assessment/Plan Principal Problem:   Chest pain Active Problems:   Diabetes mellitus (HCC)   Ventral hernia without obstruction or gangrene   Chronic obstructive pulmonary disease, unspecified (HCC)   Major depressive disorder, single episode, unspecified   Chest pain-typical with atypical components.  Resolved with nitroglycerin.  Heart score at least 5.  Troponin 9 > 12.  EKG showing  some mild ST depressions in leads II, III, aVF, V5 V6, different from prior EKG.  She has a large abdominal hernia, which may be related to her chest pain.  Shows mild left base subsegmental atelectasis/infiltrate-she is without respiratory complaints, afebrile without leukocytosis. -Trend troponin -Obtain echocardiogram -Aspirin 325, continue 81 mg daily -Nitro as needed -Lipid panel a.m. -Repeat EKG a.m. -Atorvastatin 40 mg daily -Cardiology consult -N. P.o. midnight  Controlled diabetes mellitus-random glucose 121, 03/07/2020 6.8. -Hold Janumet - SSI-S  Hypertension-stable. -Resume losartan.  CKD 3-creatinine 1.4, baseline 1.2-1.3.  COPD-stable.   DVT prophylaxis: Lovenox Code Status: Full code Family Communication: Daughter [name] at bedside. Disposition Plan: 1 to 2 days.  Pending cardiology evaluation. Consults called: Cardiology Admission status: Observation, telemetry.   Onnie Boer MD [facility]  05/17/2020, 7:37 PM

## 2020-05-17 NOTE — ED Triage Notes (Signed)
EMS reports pt has been having intermittent chest pain for the past few days. Today was in [employer] and became very weak, sob, and chest started hurting.  CBG 141.  EMS administered 324mg  asa and 1 nitro.  Reports no relief from nitro.

## 2020-05-17 NOTE — ED Provider Notes (Signed)
[facility] EMERGENCY DEPARTMENT Provider Note   CSN: [id number] Arrival date & time: 05/17/20  1509     History Chief Complaint  Patient presents with  . Chest Pain    [name] [name] is a 79 y.o. female.  Patient complains of chest discomfort.  She has been hurting off and on for most of the day.  Patient was given 1 nitro by the paramedics and that seemed to help her pain but did not completely take it away.  Patient was given nitro here in the emergency department and her pain went away.  Patient has a history of diabetes hypertension  The history is provided by the patient. No language interpreter was used.  Chest Pain Pain location:  Substernal area Pain quality: aching   Pain radiates to:  Does not radiate Pain severity:  Moderate Onset quality:  Sudden Timing:  Constant Progression:  Worsening Chronicity:  New Context: not breathing   Relieved by:  Nothing Worsened by:  Nothing Ineffective treatments:  Nitroglycerin Associated symptoms: no abdominal pain, no back pain, no cough, no fatigue and no headache        Past Medical History:  Diagnosis Date  . Anemia, unspecified   . Anxiety disorder, unspecified   . Atherosclerotic heart disease of native coronary artery without angina pectoris   . Chronic obstructive pulmonary disease, unspecified ([place])   . Diabetes mellitus   . Gastro-esophageal reflux disease with esophagitis   . Heart disease   . Hypertension   . Hypertensive chronic kidney disease with stage 1 through stage 4 chronic kidney disease, or unspecified chronic kidney disease   . Hypertensive heart disease without heart failure   . Major depressive disorder, single episode, unspecified   . Pneumonia   . Renal disorder   . Shortness of breath   . Type 2 diabetes mellitus with diabetic nephropathy ([place])   . Type 2 diabetes mellitus with hyperglycemia ([place])   . Unspecified abdominal hernia without obstruction or gangrene   . Unspecified  osteoarthritis, unspecified site     Patient Active Problem List   Diagnosis Date Noted  . Anxiety disorder, unspecified   . Chronic obstructive pulmonary disease, unspecified ([place])   . Gastro-esophageal reflux disease with esophagitis   . Major depressive disorder, single episode, unspecified   . Type 2 diabetes mellitus with hyperglycemia ([place])   . Unspecified osteoarthritis, unspecified site   . Anemia, unspecified   . Atherosclerotic heart disease of native coronary artery without angina pectoris   . Hypertensive chronic kidney disease with stage 1 through stage 4 chronic kidney disease, or unspecified chronic kidney disease   . Hypertensive heart disease without heart failure   . Type 2 diabetes mellitus with diabetic nephropathy ([place])   . Unspecified abdominal hernia without obstruction or gangrene   . Ventral hernia without obstruction or gangrene 09/01/2017  . Incarcerated ventral hernia   . Chest pain 06/11/2016  . DIABETES MELLITUS 06/13/2009  . GERD 06/13/2009    Past Surgical History:  Procedure Laterality Date  . NEPHRECTOMY    . VENTRAL HERNIA REPAIR  09/01/2017   mistaken entry     OB History   No obstetric history on file.     No family history on file.  Social History   Tobacco Use  . Smoking status: Never Smoker  . Smokeless tobacco: Never Used  Substance Use Topics  . Alcohol use: No  . Drug use: No    Home Medications Prior to Admission medications  Medication Sig Start Date End Date Taking? Authorizing Provider  acetaminophen (TYLENOL) 325 MG tablet Take 650 mg by mouth every 6 (six) hours as needed for moderate pain.    [provider]  aspirin 81 MG tablet Take 81 mg by mouth every evening.     [provider]  calcium-vitamin D (OSCAL WITH D) 500-200 MG-UNIT tablet Take 1 tablet by mouth.    [provider]  cetirizine (ZYRTEC) 10 MG tablet Take 10 mg by mouth daily.    [provider]  fish  oil-omega-3 fatty acids 1000 MG capsule Take 1 g by mouth 2 (two) times daily.    [provider]  JANUMET 50-1000 MG tablet Take 1 tablet by mouth 2 (two) times daily. 05/13/16   [provider]  Boris Lown Oil 1000 MG CAPS Take by mouth.    [provider]  losartan (COZAAR) 50 MG tablet Take 25 mg by mouth daily.    [provider]  Multiple Vitamin (MULITIVITAMIN WITH MINERALS) TABS Take 1 tablet by mouth daily.    [provider]  Multiple Vitamins-Minerals (HAIR SKIN AND NAILS FORMULA) TABS Take by mouth.    [provider]  nystatin (MYCOSTATIN/NYSTOP) powder Apply topically 3 (three) times daily. Patient taking differently: Apply 1 g topically daily as needed (infection).  07/02/17   Zadie Rhine, MD  vitamin E 1000 UNIT capsule Take 1,000 Units by mouth daily.    [provider]    Allergies    Contrast media [iodinated diagnostic agents], Morphine and related, Other, Penicillins, and Codeine  Review of Systems   Review of Systems  Constitutional: Negative for appetite change and fatigue.  HENT: Negative for congestion, ear discharge and sinus pressure.   Eyes: Negative for discharge.  Respiratory: Negative for cough.   Cardiovascular: Positive for chest pain.  Gastrointestinal: Negative for abdominal pain and diarrhea.  Genitourinary: Negative for frequency and hematuria.  Musculoskeletal: Negative for back pain.  Skin: Negative for rash.  Neurological: Negative for seizures and headaches.  Psychiatric/Behavioral: Negative for hallucinations.    Physical Exam Updated Vital Signs BP 121/65   Pulse 68   Temp 98 F (36.7 C) (Oral)   Resp (!) 25   Ht 4\' 11"  (1.499 m)   SpO2 98%   BMI 31.87 kg/m   Physical Exam Vitals and nursing note reviewed.  Constitutional:      Appearance: She is well-developed.  HENT:     Head: Normocephalic.     Nose: Nose normal.  Eyes:     General: No scleral icterus.     Conjunctiva/sclera: Conjunctivae normal.  Neck:     Thyroid: No thyromegaly.  Cardiovascular:     Rate and Rhythm: Normal rate and regular rhythm.     Heart sounds: No murmur. No friction rub. No gallop.   Pulmonary:     Breath sounds: No stridor. No wheezing or rales.  Chest:     Chest wall: No tenderness.  Abdominal:     General: There is no distension.     Tenderness: There is no abdominal tenderness. There is no rebound.  Musculoskeletal:        General: Normal range of motion.     Cervical back: Neck supple.  Lymphadenopathy:     Cervical: No cervical adenopathy.  Skin:    Findings: No erythema or rash.  Neurological:     Mental Status: She is alert and oriented to person, place, and time.     Motor:  No abnormal muscle tone.     Coordination: Coordination normal.  Psychiatric:        Behavior: Behavior normal.     ED Results / Procedures / Treatments   Labs (all labs ordered are listed, but only abnormal results are displayed) Labs Reviewed  CBC WITH DIFFERENTIAL/PLATELET - Abnormal; Notable for the following components:      Result Value   RBC 3.58 (*)    Hemoglobin 11.3 (*)    HCT 34.2 (*)    All other components within normal limits  BASIC METABOLIC PANEL - Abnormal; Notable for the following components:   Glucose, Bld 121 (*)    BUN 25 (*)    Creatinine, Ser 1.40 (*)    GFR calc non Af Amer 36 (*)    GFR calc Af Amer 42 (*)    All other components within normal limits  SARS CORONAVIRUS 2 BY RT PCR (HOSPITAL ORDER, PERFORMED IN [facility])  HEPATIC FUNCTION PANEL  TROPONIN I (HIGH SENSITIVITY)  TROPONIN I (HIGH SENSITIVITY)    EKG EKG Interpretation  Date/Time:  Thursday May 17 2020 15:16:25 EDT Ventricular Rate:  91 PR Interval:    QRS Duration: 104 QT Interval:  313 QTC Calculation: 385 R Axis:   81 Text Interpretation: Sinus rhythm Borderline right axis deviation Nonspecific repol abnormality, lateral leads Confirmed by Bethann Berkshire [phone]) on 05/17/2020 3:32:34 PM   Radiology DG Chest Portable 1 View  Result Date: 05/17/2020 CLINICAL DATA:  Chest pain. EXAM: PORTABLE CHEST 1 VIEW COMPARISON:  07/27/2017. FINDINGS: Mediastinum hilar structures normal. Mild left base subsegmental atelectasis/infiltrate. Small left pleural effusion. No pneumothorax. Cardiomegaly. No pulmonary venous congestion. Degenerative change thoracic spine. IMPRESSION: Mild left base subsegmental atelectasis/infiltrate. Electronically Signed   By: Maisie Fus  Register   On: 05/17/2020 16:19    Procedures Procedures (including critical care time)  Medications Ordered in ED Medications  nitroGLYCERIN (NITROSTAT) SL tablet 0.4 mg (0.4 mg Sublingual Given 05/17/20 1550)  sodium chloride 0.9 % bolus 250 mL (0 mLs Intravenous Stopped 05/17/20 1721)  pantoprazole (PROTONIX) injection 40 mg (40 mg Intravenous Given 05/17/20 1751)    ED Course  I have reviewed the triage vital signs and the nursing notes.  Pertinent labs & imaging results that were available during my care of the patient were reviewed by me and considered in my medical decision making (see chart for details). CRITICAL CARE Performed by: Bethann Berkshire Total critical care time: 45 minutes Critical care time was exclusive of separately billable procedures and treating other patients. Critical care was necessary to treat or prevent imminent or life-threatening deterioration. Critical care was time spent personally by me on the following activities: development of treatment plan with patient and/or surrogate as well as nursing, discussions with consultants, evaluation of patient's response to treatment, examination of patient, obtaining history from patient or surrogate, ordering and performing treatments and interventions, ordering and review of laboratory studies, ordering and review of radiographic studies, pulse oximetry and re-evaluation of patient's condition.    MDM Rules/Calculators/A&P                       Patient with chest pain that was relieved by 2 nitros.  Patient had EKG depression laterally.  She will be admitted for cardiac work-up      This patient presents to the ED for concern of chest pain this involves an extensive number of treatment options, and is a complaint that carries with it a  high risk of complications and morbidity.  The differential diagnosis includes cardiac disease pneumonia   Lab Tests:   I Ordered, reviewed, and interpreted labs, which included CBC and chemistries and troponin.  Patient has mild anemia normal troponin  Medicines ordered:   I ordered medication nitroglycerin for chest pain  Imaging Studies ordered:   I ordered imaging studies which included chest x-ray and  I independently visualized and interpreted imaging which showed unremarkable  Additional history obtained:   Additional history obtained from records  Previous records obtained and reviewed   Consultations Obtained:   I consulted hospitalist and discussed lab and imaging findings  Reevaluation:  After the interventions stated above, I reevaluated the patient and found improved  Critical Interventions:  .   Final Clinical Impression(s) / ED Diagnoses Final diagnoses:  Atypical chest pain    Rx / DC Orders ED Discharge Orders    None       Bethann Berkshire, MD 05/17/20 1820

## 2020-05-18 ENCOUNTER — Observation Stay (HOSPITAL_COMMUNITY): Payer: Medicare Other

## 2020-05-18 ENCOUNTER — Encounter (HOSPITAL_COMMUNITY): Payer: Self-pay | Admitting: Internal Medicine

## 2020-05-18 DIAGNOSIS — R9439 Abnormal result of other cardiovascular function study: Secondary | ICD-10-CM | POA: Diagnosis not present

## 2020-05-18 DIAGNOSIS — N179 Acute kidney failure, unspecified: Secondary | ICD-10-CM | POA: Diagnosis present

## 2020-05-18 DIAGNOSIS — Z48812 Encounter for surgical aftercare following surgery on the circulatory system: Secondary | ICD-10-CM | POA: Diagnosis not present

## 2020-05-18 DIAGNOSIS — E1165 Type 2 diabetes mellitus with hyperglycemia: Secondary | ICD-10-CM | POA: Diagnosis not present

## 2020-05-18 DIAGNOSIS — J90 Pleural effusion, not elsewhere classified: Secondary | ICD-10-CM | POA: Diagnosis not present

## 2020-05-18 DIAGNOSIS — R079 Chest pain, unspecified: Secondary | ICD-10-CM

## 2020-05-18 DIAGNOSIS — Z20822 Contact with and (suspected) exposure to covid-19: Secondary | ICD-10-CM | POA: Diagnosis present

## 2020-05-18 DIAGNOSIS — E785 Hyperlipidemia, unspecified: Secondary | ICD-10-CM

## 2020-05-18 DIAGNOSIS — Z951 Presence of aortocoronary bypass graft: Secondary | ICD-10-CM | POA: Diagnosis not present

## 2020-05-18 DIAGNOSIS — N1832 Chronic kidney disease, stage 3b: Secondary | ICD-10-CM | POA: Diagnosis present

## 2020-05-18 DIAGNOSIS — J189 Pneumonia, unspecified organism: Secondary | ICD-10-CM | POA: Diagnosis present

## 2020-05-18 DIAGNOSIS — K5900 Constipation, unspecified: Secondary | ICD-10-CM | POA: Diagnosis not present

## 2020-05-18 DIAGNOSIS — Z905 Acquired absence of kidney: Secondary | ICD-10-CM | POA: Diagnosis not present

## 2020-05-18 DIAGNOSIS — I10 Essential (primary) hypertension: Secondary | ICD-10-CM

## 2020-05-18 DIAGNOSIS — J440 Chronic obstructive pulmonary disease with acute lower respiratory infection: Secondary | ICD-10-CM | POA: Diagnosis present

## 2020-05-18 DIAGNOSIS — F419 Anxiety disorder, unspecified: Secondary | ICD-10-CM | POA: Diagnosis not present

## 2020-05-18 DIAGNOSIS — I129 Hypertensive chronic kidney disease with stage 1 through stage 4 chronic kidney disease, or unspecified chronic kidney disease: Secondary | ICD-10-CM | POA: Diagnosis present

## 2020-05-18 DIAGNOSIS — I25119 Atherosclerotic heart disease of native coronary artery with unspecified angina pectoris: Secondary | ICD-10-CM | POA: Diagnosis not present

## 2020-05-18 DIAGNOSIS — D62 Acute posthemorrhagic anemia: Secondary | ICD-10-CM | POA: Diagnosis not present

## 2020-05-18 DIAGNOSIS — N183 Chronic kidney disease, stage 3 unspecified: Secondary | ICD-10-CM

## 2020-05-18 DIAGNOSIS — M6281 Muscle weakness (generalized): Secondary | ICD-10-CM | POA: Diagnosis not present

## 2020-05-18 DIAGNOSIS — Z7982 Long term (current) use of aspirin: Secondary | ICD-10-CM | POA: Diagnosis not present

## 2020-05-18 DIAGNOSIS — R0789 Other chest pain: Secondary | ICD-10-CM | POA: Diagnosis present

## 2020-05-18 DIAGNOSIS — Z7984 Long term (current) use of oral hypoglycemic drugs: Secondary | ICD-10-CM | POA: Diagnosis not present

## 2020-05-18 DIAGNOSIS — R2689 Other abnormalities of gait and mobility: Secondary | ICD-10-CM | POA: Diagnosis not present

## 2020-05-18 DIAGNOSIS — K439 Ventral hernia without obstruction or gangrene: Secondary | ICD-10-CM | POA: Diagnosis present

## 2020-05-18 DIAGNOSIS — I119 Hypertensive heart disease without heart failure: Secondary | ICD-10-CM | POA: Diagnosis not present

## 2020-05-18 DIAGNOSIS — E119 Type 2 diabetes mellitus without complications: Secondary | ICD-10-CM | POA: Diagnosis not present

## 2020-05-18 DIAGNOSIS — Z8249 Family history of ischemic heart disease and other diseases of the circulatory system: Secondary | ICD-10-CM | POA: Diagnosis not present

## 2020-05-18 DIAGNOSIS — I252 Old myocardial infarction: Secondary | ICD-10-CM | POA: Diagnosis not present

## 2020-05-18 DIAGNOSIS — R41841 Cognitive communication deficit: Secondary | ICD-10-CM | POA: Diagnosis not present

## 2020-05-18 DIAGNOSIS — E1122 Type 2 diabetes mellitus with diabetic chronic kidney disease: Secondary | ICD-10-CM | POA: Diagnosis present

## 2020-05-18 DIAGNOSIS — Z91041 Radiographic dye allergy status: Secondary | ICD-10-CM | POA: Diagnosis not present

## 2020-05-18 DIAGNOSIS — I25110 Atherosclerotic heart disease of native coronary artery with unstable angina pectoris: Secondary | ICD-10-CM | POA: Diagnosis present

## 2020-05-18 DIAGNOSIS — J9811 Atelectasis: Secondary | ICD-10-CM | POA: Diagnosis present

## 2020-05-18 DIAGNOSIS — Z01810 Encounter for preprocedural cardiovascular examination: Secondary | ICD-10-CM | POA: Diagnosis not present

## 2020-05-18 DIAGNOSIS — F329 Major depressive disorder, single episode, unspecified: Secondary | ICD-10-CM | POA: Diagnosis present

## 2020-05-18 DIAGNOSIS — J449 Chronic obstructive pulmonary disease, unspecified: Secondary | ICD-10-CM | POA: Diagnosis not present

## 2020-05-18 DIAGNOSIS — E669 Obesity, unspecified: Secondary | ICD-10-CM | POA: Diagnosis not present

## 2020-05-18 DIAGNOSIS — G8929 Other chronic pain: Secondary | ICD-10-CM | POA: Diagnosis present

## 2020-05-18 DIAGNOSIS — Z79899 Other long term (current) drug therapy: Secondary | ICD-10-CM | POA: Diagnosis not present

## 2020-05-18 DIAGNOSIS — I2510 Atherosclerotic heart disease of native coronary artery without angina pectoris: Secondary | ICD-10-CM | POA: Diagnosis not present

## 2020-05-18 DIAGNOSIS — Z4682 Encounter for fitting and adjustment of non-vascular catheter: Secondary | ICD-10-CM | POA: Diagnosis not present

## 2020-05-18 HISTORY — DX: Abnormal result of other cardiovascular function study: R94.39

## 2020-05-18 LAB — ECHOCARDIOGRAM COMPLETE: Height: 59 in

## 2020-05-18 LAB — NM MYOCAR MULTI W/SPECT W/WALL MOTION / EF
LV dias vol: 74 mL (ref 46–106)
LV sys vol: 34 mL
Peak HR: 97 {beats}/min
RATE: 0.21
Rest HR: 60 {beats}/min
SDS: 19
SRS: 6
SSS: 25
TID: 1.01

## 2020-05-18 LAB — LIPID PANEL
Cholesterol: 159 mg/dL (ref 0–200)
HDL: 29 mg/dL — ABNORMAL LOW (ref >40)
LDL Cholesterol: 73 mg/dL (ref 0–99)
Total CHOL/HDL Ratio: 5.5 RATIO
Triglycerides: 286 mg/dL — ABNORMAL HIGH (ref <150)
VLDL: 57 mg/dL — ABNORMAL HIGH (ref 0–40)

## 2020-05-18 LAB — HEPARIN LEVEL (UNFRACTIONATED): Heparin Unfractionated: 0.12 IU/mL — ABNORMAL LOW (ref 0.30–0.70)

## 2020-05-18 LAB — TROPONIN I (HIGH SENSITIVITY)
Troponin I (High Sensitivity): 9 ng/L (ref <18)
Troponin I (High Sensitivity): 14 ng/L (ref <18)

## 2020-05-18 LAB — GLUCOSE, CAPILLARY
Glucose-Capillary: 143 mg/dL — ABNORMAL HIGH (ref 70–99)
Glucose-Capillary: 211 mg/dL — ABNORMAL HIGH (ref 70–99)
Glucose-Capillary: 100 mg/dL — ABNORMAL HIGH (ref 70–99)

## 2020-05-18 LAB — CBG MONITORING, ED
Glucose-Capillary: 105 mg/dL — ABNORMAL HIGH (ref 70–99)
Glucose-Capillary: 139 mg/dL — ABNORMAL HIGH (ref 70–99)

## 2020-05-18 MED ORDER — SODIUM CHLORIDE FLUSH 0.9 % IV SOLN
INTRAVENOUS | Status: AC
  Administered 2020-05-18: 10 mL via INTRAVENOUS
  Filled 2020-05-18: qty 10

## 2020-05-18 MED ORDER — GUAIFENESIN ER 600 MG PO TB12
600.0000 mg | ORAL_TABLET | Freq: Two times a day (BID) | ORAL | Status: DC
  Administered 2020-05-19 – 2020-05-21 (×6): 600 mg via ORAL
  Filled 2020-05-18 (×6): qty 1

## 2020-05-18 MED ORDER — NITROGLYCERIN 0.4 MG SL SUBL
SUBLINGUAL_TABLET | SUBLINGUAL | Status: AC
  Administered 2020-05-18: 0.4 mg via SUBLINGUAL
  Filled 2020-05-18: qty 1

## 2020-05-18 MED ORDER — DOXYCYCLINE HYCLATE 100 MG PO TABS
100.0000 mg | ORAL_TABLET | Freq: Two times a day (BID) | ORAL | Status: DC
  Administered 2020-05-18 – 2020-05-20 (×5): 100 mg via ORAL
  Filled 2020-05-18 (×5): qty 1

## 2020-05-18 MED ORDER — SODIUM CHLORIDE 0.9 % IV SOLN
1.0000 g | INTRAVENOUS | Status: DC
  Administered 2020-05-18 – 2020-05-20 (×3): 1 g via INTRAVENOUS
  Filled 2020-05-18 (×3): qty 10

## 2020-05-18 MED ORDER — HEPARIN (PORCINE) 25000 UT/250ML-% IV SOLN
800.0000 [IU]/h | INTRAVENOUS | Status: DC
  Administered 2020-05-18: 750 [IU]/h via INTRAVENOUS
  Administered 2020-05-19 – 2020-05-20 (×2): 950 [IU]/h via INTRAVENOUS
  Filled 2020-05-18 (×3): qty 250

## 2020-05-18 MED ORDER — TECHNETIUM TC 99M TETROFOSMIN IV KIT
10.0000 | PACK | Freq: Once | INTRAVENOUS | Status: AC | PRN
  Administered 2020-05-18: 10.8 via INTRAVENOUS

## 2020-05-18 MED ORDER — TECHNETIUM TC 99M TETROFOSMIN IV KIT
30.0000 | PACK | Freq: Once | INTRAVENOUS | Status: AC | PRN
  Administered 2020-05-18: 31.2 via INTRAVENOUS

## 2020-05-18 MED ORDER — REGADENOSON 0.4 MG/5ML IV SOLN
INTRAVENOUS | Status: AC
  Administered 2020-05-18: 0.4 mg via INTRAVENOUS
  Filled 2020-05-18: qty 5

## 2020-05-18 NOTE — Progress Notes (Signed)
ANTICOAGULATION CONSULT NOTE   Pharmacy Consult for:  heparin dosing Indication: ACS/STEMI  Allergies  Allergen Reactions   Contrast Media [Iodinated Diagnostic Agents]     Unknown reaction   Morphine And Related    Other     Pt has multiple allergies per pt but unsure of all.    Penicillins     Unknown reaction   Codeine Rash    Patient Measurements: Height: 4' 11.02" (149.9 cm) Weight: 71 kg (156 lb 8.4 oz) IBW/kg (Calculated) : 43.24 Heparin Dosing Weight:  HEPARIN DW (KG): 59.1 HEPARIN DW (KG): 59.1  Vital Signs: Temp: 98.4 F (36.9 C) (06/04 2030) Temp Source: Oral (06/04 2030) BP: 146/62 (06/04 2030) Pulse Rate: 67 (06/04 2030)  Labs: Recent Labs    05/17/20 1538 05/17/20 1722 05/17/20 1951 05/18/20 1255 05/18/20 1452 05/18/20 2102  HGB 11.3*  --   --   --   --   --   HCT 34.2*  --   --   --   --   --   PLT 264  --   --   --   --   --   HEPARINUNFRC  --   --   --   --   --  0.12*  CREATININE 1.40*  --   --   --   --   --   TROPONINIHS 9   < > 13 9 14   --    < > = values in this interval not displayed.   Estimated Creatinine Clearance: 28.4 mL/min (A) (by C-G formula based on SCr of 1.4 mg/dL (H)).    Assessment: Pharmacy consulted to dose heparin infusion for this   79 yo female with  chest pain.    Baseline  Hb  11.3 and platelets are WNL.  Patient wasn't taking any anti-coagulant medications prior to admission, but she did receive a dose of prophylactic Lovenox 40mg  at 0151 on 6/4.  Heparin level low at 0.12. RN states issues with IV site prior to this.     Goal of Therapy:  Heparin level 0.3-0.7 units/ml Monitor platelets by anticoagulation protocol: Yes   Plan:  Increase heparin drip to 950 units/hr Heparin level in ~8 hours Monitor for bleeding  Georgean Spainhower A Weston Fulco 05/18/2020,9:55 PM

## 2020-05-18 NOTE — Consult Note (Addendum)
Cardiology Consult    Patient ID: [name] [name]; [id number]; Feb 10, 1941   Admit date: 05/17/2020 Date of Consult: 05/18/2020  Primary Care Provider: Maryruth Hancock, MD Primary Cardiologist: Evaluated by Dr. Harrington Challenger during admission in 05/2016 - No outpatient follow-up.   Patient Profile    [name] [name] is a 79 y.o. female with past medical history of HTN, HLD, Stage 3 CKD (s/p L nephrectomy in 3151'V complicated by abdominal wall hernia) and Type 2 DM who is being seen today for the evaluation of chest pain at the request of Dr. Denton Brick.   History of Present Illness    [name] presented to [facility] ED on 05/17/2020 for evaluation of chest pain which started while at [facility]. She is a rambling historian and alternates between talking about current events and her surgery that occurred in the 1970's. She reports having intermittent episodes of chest discomfort over the past several years which is typically worse with bending forward. She has chronic pain along her prior nephrectomy site which was complicated by an abdominal wall hernia and says she has been in constant pain due to this since the 1970's. She describes her chest discomfort as a pressure which occurs along her sternum and is typically worse with positional changes. Pain improves when she lays down. She has not noticed any association with exertion but her pain the day of presentation did occur while walking around [place]. She is not overly active at baseline. She denies any associated orthopnea, PND or lower extremity edema. No recent palpitations, dizziness or presyncope. She denies any recurrent pain overnight and feels back to baseline this morning.  She does report a family history of CAD with her father having died of an MI in the setting of PNA when he was 64. Reports her sister had CHF but is unsure of the specifics surrounding this. The patient herself denies any personal history of known CAD or CHF.  No prior tobacco  use.  Initial labs showed WBC 8.4, Hgb 11.3, platelets 264, Na+ 137, K+ 4.4 and creatinine 1.40 (baseline 1.2 - 1.3). Initial and repeat HS Troponin values negative at 9, 12 and 13. COVID negative. CXR shows mild left base subsegmental atelectasis with possible infiltrate. EKG shows NSR, HR 91 with nonspecific IVCD and ST depression along the inferior and lateral leads which is similar to prior tracings but now appears more prominent.    Past Medical History:  Diagnosis Date  . Anemia, unspecified   . Anxiety disorder, unspecified   . Atherosclerotic heart disease of native coronary artery without angina pectoris   . Chronic obstructive pulmonary disease, unspecified ([place])   . Diabetes mellitus   . Gastro-esophageal reflux disease with esophagitis   . Heart disease   . Hypertension   . Hypertensive chronic kidney disease with stage 1 through stage 4 chronic kidney disease, or unspecified chronic kidney disease   . Hypertensive heart disease without heart failure   . Major depressive disorder, single episode, unspecified   . Pneumonia   . Renal disorder   . Shortness of breath   . Type 2 diabetes mellitus with diabetic nephropathy ([place])   . Type 2 diabetes mellitus with hyperglycemia ([place])   . Unspecified abdominal hernia without obstruction or gangrene   . Unspecified osteoarthritis, unspecified site     Past Surgical History:  Procedure Laterality Date  . NEPHRECTOMY    . VENTRAL HERNIA REPAIR  09/01/2017   mistaken entry     Home  Medications:  Prior to Admission medications   Medication Sig Start Date End Date Taking? Authorizing Provider  aspirin 81 MG tablet Take 81 mg by mouth every evening.    Yes [provider]  Biotin w/ Vitamins C & E (HAIR SKIN & NAILS GUMMIES PO) Take 2 each by mouth every morning.   Yes [provider]  fish oil-omega-3 fatty acids 1000 MG capsule Take 1 g by mouth 2 (two) times daily.   Yes [provider]  JANUMET  50-1000 MG tablet Take 1 tablet by mouth 2 (two) times daily. 05/13/16  Yes [provider]  losartan (COZAAR) 50 MG tablet Take 25 mg by mouth every morning.    Yes [provider]  Multiple Vitamin (MULITIVITAMIN WITH MINERALS) TABS Take 1 tablet by mouth daily.   Yes [provider]  Multiple Vitamins-Minerals (AIRBORNE GUMMIES PO) Take 1 each by mouth every morning.   Yes [provider]    Inpatient Medications: Scheduled Meds: . aspirin EC  81 mg Oral QPM  . atorvastatin  40 mg Oral Daily  . enoxaparin (LOVENOX) injection  40 mg Subcutaneous QHS  . insulin aspart  0-5 Units Subcutaneous QHS  . insulin aspart  0-9 Units Subcutaneous TID WC  . losartan  25 mg Oral q morning - 10a   Continuous Infusions:  PRN Meds: acetaminophen **OR** acetaminophen, nitroGLYCERIN, ondansetron **OR** ondansetron (ZOFRAN) IV, polyethylene glycol  Allergies:    Allergies  Allergen Reactions  . Contrast Media [Iodinated Diagnostic Agents]     Unknown reaction  . Morphine And Related   . Other     Pt has multiple allergies per pt but unsure of all.   . Penicillins     Unknown reaction  . Codeine Rash    Social History:   Social History   Socioeconomic History  . Marital status: Widowed    Spouse name: Not on file  . Number of children: Not on file  . Years of education: Not on file  . Highest education level: Not on file  Occupational History  . Not on file  Tobacco Use  . Smoking status: Never Smoker  . Smokeless tobacco: Never Used  Substance and Sexual Activity  . Alcohol use: No  . Drug use: No  . Sexual activity: Never  Other Topics Concern  . Not on file  Social History Narrative  . Not on file   Social Determinants of Health   Financial Resource Strain:   . Difficulty of Paying Living Expenses:   Food Insecurity:   . Worried About Programme researcher, broadcasting/film/video in the Last Year:   . Barista in the Last Year:   Transportation Needs:    . Freight forwarder (Medical):   Marland Kitchen Lack of Transportation (Non-Medical):   Physical Activity:   . Days of Exercise per Week:   . Minutes of Exercise per Session:   Stress:   . Feeling of Stress :   Social Connections:   . Frequency of Communication with Friends and Family:   . Frequency of Social Gatherings with Friends and Family:   . Attends Religious Services:   . Active Member of Clubs or Organizations:   . Attends Banker Meetings:   Marland Kitchen Marital Status:   Intimate Partner Violence:   . Fear of Current or Ex-Partner:   . Emotionally Abused:   Marland Kitchen Physically Abused:   . Sexually Abused:      Family History:  Family History  Problem Relation Age of Onset  . CAD Father   . Heart failure Sister       Review of Systems    General:  No chills, fever, night sweats or weight changes.  Cardiovascular:  No dyspnea on exertion, edema, orthopnea, palpitations, paroxysmal nocturnal dyspnea. Positive for chest pain.  Dermatological: No rash, lesions/masses Respiratory: No cough, dyspnea Urologic: No hematuria, dysuria Abdominal:   No nausea, vomiting, diarrhea, bright red blood per rectum, melena, or hematemesis Neurologic:  No visual changes, wkns, changes in mental status. All other systems reviewed and are otherwise negative except as noted above.  Physical Exam/Data    Vitals:   05/18/20 0600 05/18/20 0615 05/18/20 0630 05/18/20 0700  BP: (!) 112/45  (!) 110/46 (!) 122/56  Pulse: (!) 59 61 63 65  Resp: (!) 25 (!) 24 (!) 23 19  Temp:      TempSrc:      SpO2: 96% 94% 97% 97%  Height:        Intake/Output Summary (Last 24 hours) at 05/18/2020 0837 Last data filed at 05/17/2020 1721 Gross per 24 hour  Intake 250 ml  Output --  Net 250 ml   There were no vitals filed for this visit. Body mass index is 31.87 kg/m.   General: Pleasant Caucasian female appearing in NAD Psych: Normal affect. Neuro: Alert and oriented X 3. Moves all extremities  spontaneously. HEENT: Normal  Neck: Supple without bruits or JVD. Lungs:  Resp regular and unlabored, CTA without wheezing or rales. Heart: RRR no s3, s4, or murmurs. Abdomen: Soft, BS + x 4. Abdominal wall mass noted along LLQ which stretches along her left side.  Extremities: No clubbing, cyanosis or lower extremity edema. DP/PT/Radials 2+ and equal bilaterally.   EKG:  The EKG was personally reviewed and demonstrates: NSR, HR 91 with nonspecific IVCD and ST depression along the inferior and lateral leads which is similar to prior tracings but now appears more prominent.   Telemetry:  Telemetry was personally reviewed and demonstrates: NSR, HR in 60's to 70's. Occasional sinus pauses at 1.72 seconds.    Labs/Studies     Relevant CV Studies:  NST: 2008 Overall findings are consistent with scar in the lateral wall, scar and/or soft tissue attenuation inferiorly, and probable soft tissue attenuation in the anteroseptal region. LVF on gating calculated at 51%, though visually it appeared somewhat worse.     Echocardiogram: Pending  Laboratory Data:  Chemistry Recent Labs  Lab 05/17/20 1538  NA 137  K 4.4  CL 105  CO2 22  GLUCOSE 121*  BUN 25*  CREATININE 1.40*  CALCIUM 9.1  GFRNONAA 36*  GFRAA 42*  ANIONGAP 10    Recent Labs  Lab 05/17/20 1539  PROT 7.0  ALBUMIN 3.8  AST 17  ALT 18  ALKPHOS 48  BILITOT 0.4   Hematology Recent Labs  Lab 05/17/20 1538  WBC 8.4  RBC 3.58*  HGB 11.3*  HCT 34.2*  MCV 95.5  MCH 31.6  MCHC 33.0  RDW 12.1  PLT 264   Cardiac EnzymesNo results for input(s): TROPONINI in the last 168 hours. No results for input(s): TROPIPOC in the last 168 hours.  BNPNo results for input(s): BNP, PROBNP in the last 168 hours.  DDimer No results for input(s): DDIMER in the last 168 hours.  Radiology/Studies:  DG Chest Portable 1 View  Result Date: 05/17/2020 CLINICAL DATA:  Chest pain. EXAM: PORTABLE CHEST 1 VIEW COMPARISON:  07/27/2017.  FINDINGS:  Mediastinum hilar structures normal. Mild left base subsegmental atelectasis/infiltrate. Small left pleural effusion. No pneumothorax. Cardiomegaly. No pulmonary venous congestion. Degenerative change thoracic spine. IMPRESSION: Mild left base subsegmental atelectasis/infiltrate. Electronically Signed   By: Maisie Fus  Register   On: 05/17/2020 16:19     Assessment & Plan    1. Chest Pain with Atypical Features/ Abnormal EKG - Her episodes of chest discomfort seem atypical for for a cardiac etiology as they typically occur when she bends over and improve spontaneously upon sitting up or lying down. They have not been associated with exertion. - High-sensitivity troponin values have been negative but her EKG does show nonspecific IVCD and ST depression along the inferior and lateral leads which is similar to prior tracings but now appears more prominent.  - An echocardiogram is pending to assess for any structural abnormalities. Even if her echocardiogam is without acute findings, she would benefit from ischemic evaluation given her cardiac risk factors (HTN, HLD, prediabetes, and family history of CAD) and abnormal EKG. Will disucss inpatient versus outpatient evaluation with Dr. Wyline Mood. Keep NPO for now.   2. HTN - BP has overall been well controlled while in the ED, at 122/56 on most recent check. She has been continued on PTA Losartan 25mg  daily.   3. HLD - FLP in 02/2020 showed total cholesterol 182, HDL 38, triglycerides 234 and LDL 109. She was only on Fish Oil prior to admission but has been started on Atorvastatin 40mg  daily.   4. Stage 3 CKD - she is s/p L nephrectomy in 1970's complicated by abdominal wall hernia. Baseline creatinine 1.2 - 1.3. Elevated to 1.40 on admission.   5. Type 2 DM - Hgb A1c elevated to 6.8 in 02/2020 and 6.9 six months prior. On Janumet as an outpatient.     For questions or updates, please contact [facility] Please consult [email or web address] for  contact info under Cardiology/STEMI.  Signed, , PA-C 05/18/2020, 8:37 AM Pager: [phone]  Attending note   Patient seen and discussed with PA 07/18/2020, I agree with her documentation. 79 yo female history of chest pain with nuclear stress 2008 without ischemia, DM2, COPD, HTN, CKD 3 admitted with chest pain   She is an imprecise historian, difficult to distinguish concise details of her chest pain. It is reported to be heaviness mid to left chest with associated SOB,. Can occur at rest or with exertion,can be worst with bending over, better with pressing on her chest. Lasts about 15 minutes at a time.   WBC 8.4 Hgb 11.3 Plt 264 K 4.4 Cr 1.4 BUN 25  hstrop 9-->12-->13 EKG inferior and lateral ST depressions.  COVID neg  CXR: mild left base subsegmental atelctasis vs infilrtate   Unclear etiology of her chest pain. She is an imprecise historian as reported above, some components are typical some atypical. She has CAD risk factors including DM2, certaintly at risk for atypical angina.  Has had some prior nonspecific inferior/lateral ST/T changes on EKG, findings more prominent this admission. Troponins are negative, echo is prelim review shows LVEF 60% no WMAs. We will plan for inpatient lexiscan to further evaluate.   70 MD

## 2020-05-18 NOTE — Progress Notes (Signed)
*  PRELIMINARY RESULTS* Echocardiogram 2D Echocardiogram has been performed.  [name] [name] 05/18/2020, 9:20 AM

## 2020-05-18 NOTE — Progress Notes (Addendum)
ANTICOAGULATION CONSULT NOTE   Pharmacy Consult for:  heparin dosing Indication: ACS/STEMI  Allergies  Allergen Reactions  . Contrast Media [Iodinated Diagnostic Agents]     Unknown reaction  . Morphine And Related   . Other     Pt has multiple allergies per pt but unsure of all.   . Penicillins     Unknown reaction  . Codeine Rash    Patient Measurements: Height: 4\' 11"  (149.9 cm) IBW/kg (Calculated) : 43.2 Heparin Dosing Weight:    HEPARIN DW (KG): 59.1  Vital Signs: Temp: 98.3 F (36.8 C) (06/04 1217) Temp Source: Oral (06/04 1217) BP: 106/70 (06/04 1217) Pulse Rate: 82 (06/04 1217)  Labs: Recent Labs    05/17/20 1538 05/17/20 1722 05/17/20 1951  HGB 11.3*  --   --   HCT 34.2*  --   --   PLT 264  --   --   CREATININE 1.40*  --   --   TROPONINIHS 9 12 13    CrCl cannot be calculated (Unknown ideal weight.).    Assessment: Pharmacy consulted to dose heparin infusion for this   79 yo female with  chest pain.    Baseline  Hb  11.3 and platelets are WNL.  Patient wasn't taking any anti-coagulant medications prior to admission, but she did receive a dose of prophylactic Lovenox 40mg  at 0151 on 6/4.   Goal of Therapy:  Heparin level 0.3-0.7 units/ml Monitor platelets by anticoagulation protocol: Yes   Plan:  Start heparin infusion at 750 units/hr Check anti-Xa level in 6-8 hours and daily while on heparin Continue to monitor H&H and platelets  70 05/18/2020,12:28 PM

## 2020-05-18 NOTE — Progress Notes (Signed)
    Patient's stress test was high-risk and cardiac catheterization was recommended by Dr. Wyline Mood. Called the catheterization lab today and there is no availability until Monday. Therefore, her procedure is scheduled with Dr. [place] on 05/21/2020.  Reviewed the procedure with a patient and her daughter [name] who is a patient of [facility] as well). She is in agreement to proceed. The patient understands that risks include but are not limited to stroke (1 in 1000), death (1 in 1000), kidney failure [usually temporary] (1 in 500), bleeding (1 in 200), allergic reaction [possibly serious] (1 in 200). She does report an allergic reaction to contrast many years ago and is unsure of her exact reaction but reports having hives. Will pre-treat for a contrast allergy.   Continue ASA and statin therapy. Heparin has been ordered. I spoke to the Hospitalist who will arrange transfer.   Signed, Ellsworth Lennox, PA-C 05/18/2020, 1:11 PM Pager: [phone]

## 2020-05-18 NOTE — Plan of Care (Signed)
Family visiting at bedside. All procedures, medications, and plan of care explained.

## 2020-05-18 NOTE — Progress Notes (Signed)
Patient Demographics:    [name] [name], is a 79 y.o. female, DOB - June 06, 1941, FBP:[id number]  Admit date - 05/17/2020   Admitting Physician Ejiroghene Arlyce Dice, MD  Outpatient Primary MD for the patient is Corum, Rex Kras, MD  LOS - 0   Chief Complaint  Patient presents with  . Chest Pain        Subjective:    [place] today has no fevers, no emesis,   --Chest pains appear to have resolved at this time -No dizziness palpitations or pleuritic symptoms at this time  Assessment  & Plan :    Principal Problem:   Chest pain Active Problems:   Diabetes mellitus ([place])   Ventral hernia without obstruction or gangrene   Chronic obstructive pulmonary disease, unspecified (HCC)   Major depressive disorder, single episode, unspecified   Brief Summary:- 79 y.o. female with past medical history of HTN, HLD, Stage 3 bCKD (s/p L nephrectomy in 8242'P complicated by abdominal wall hernia) and Type 2 DM admitted on 05/17/2020 with atypical chest pain, stress test is high risk, needs LHC however patient has contrast allergy, will have to be premedicated prior to [facility] on 05/21/2020 -Transfer to [facility] for [facility]  A/p 1)Chest Pain--with atypical features--- cardiovascular risk factors include age, status post menopause, sedentary lifestyle, HTN, HLD, family history of CAD (father died of MI at age 52)---- -stress test is abnormal high risk (see below) --- needs LHC, however patient has contrast allergy, will have to be premedicated prior to [facility] on 05/21/2020 -Transfer to [facility] =-Chest pain currently has resolved, if chest pain reoccurs and persist consider nitro if BP allows -IV heparin, aspirin and Lipitor as ordered -Cardiology input appreciated -Echo with preserved EF of 55 to 60%, without wall motion abnormalities,  2)AKI----acute kidney injury on CKD stage - 3b  creatinine on admission=1.40  ,  baseline creatinine =  1.3 (02/2020)   ,  renally adjust medications, avoid nephrotoxic agents / dehydration  / hypotension -Monitor renal function closely especially due to pending LHC -Hold Metformin -hold  losartan prior to LHC (EF is WNL) -she is s/p L nephrectomy in 5361'W complicated by abdominal wall hernia  3)DM2----A1c 6.8 reflecting excellent diabetic control PTA, hold Metformin due to AKI on CKD and pending contrast exposure with LHC - Use Novolog/Humalog Sliding scale insulin with Accu-Cheks/Fingersticks as ordered   4)Possible Lt sided PNA--- Doxy and rocephin as ordered, mucolytics and as needed bronchodilators  5) hyperlipidemia-fasting lipid profile from March 2021 noted, LDL was 109 and HDL was 38, PTA patient was already on fish oil, Lipitor 40 mg daily has been added this admission  6) COPD--- stable, no acute exacerbation despite #4 above, management as above #4 -No indication for steroids at this time   Disposition/Need for in-Hospital Stay- patient unable to be discharged at this time due to --chest pain with abnormal stress test requires LHC currently on IV heparin  Status is: Inpatient  Remains inpatient appropriate because:Ongoing diagnostic testing needed not appropriate for outpatient work up, IV treatments appropriate due to intensity of illness or inability to take PO and Inpatient level of care appropriate due to severity of illness   Dispo: The patient is from: Home  Anticipated d/c is to: Home              Anticipated d/c date is: > 3 days              Patient currently is not medically stable to d/c.    Procedures:- Nuclear stress test 05/18/2020-  Mild progression of baseline inferior and lateral ST/T changes after regadenoson  Chest pain after regadenoson, mild improvement with sublignual nitroglycerin x 2  Findings consistent with large inferior/inferoapical/inferolateral prior myocardial infarction with high degree of peri-infarct  ischemia. Large inferoseptal/septal infarct with high degree of peri-infarct ischemia. Overall SSS 25, SRS 6, SDS 19  This is a high risk study.  The left ventricular ejection fraction is normal (55-65%).  Barriers: Not Clinically Stable- -needs IV heparin and cardiac cath  Code Status : full  Family Communication:   NA (patient is alert, awake and coherent)  Consults  :  cardiology  DVT Prophylaxis  : IV heparin  Lab Results  Component Value Date   PLT 264 05/17/2020    Inpatient Medications  Scheduled Meds: . aspirin EC  81 mg Oral QPM  . atorvastatin  40 mg Oral Daily  . doxycycline  100 mg Oral Q12H  . guaiFENesin  600 mg Oral BID  . insulin aspart  0-5 Units Subcutaneous QHS  . insulin aspart  0-9 Units Subcutaneous TID WC  . losartan  25 mg Oral q morning - 10a   Continuous Infusions: . cefTRIAXone (ROCEPHIN)  IV    . heparin 750 Units/hr (05/18/20 1354)   PRN Meds:.acetaminophen **OR** acetaminophen, nitroGLYCERIN, ondansetron **OR** ondansetron (ZOFRAN) IV, polyethylene glycol    Anti-infectives (From admission, onward)   Start     Dose/Rate Route Frequency Ordered Stop   05/18/20 2200  doxycycline (VIBRA-TABS) tablet 100 mg     100 mg Oral Every 12 hours 05/18/20 1620     05/18/20 1630  cefTRIAXone (ROCEPHIN) 1 g in sodium chloride 0.9 % 100 mL IVPB     1 g 200 mL/hr over 30 Minutes Intravenous Every 24 hours 05/18/20 1620          Objective:   Vitals:   05/18/20 0615 05/18/20 0630 05/18/20 0700 05/18/20 1217  BP:  (!) 110/46 (!) 122/56 106/70  Pulse: 61 63 65 82  Resp: (!) 24 (!) 23 19 16   Temp:    98.3 F (36.8 C)  TempSrc:    Oral  SpO2: 94% 97% 97% 100%  Weight:    71 kg  Height:    4' 11.02" (1.499 m)    Wt Readings from Last 3 Encounters:  05/18/20 71 kg  03/07/20 71.6 kg  12/09/17 66.3 kg     Intake/Output Summary (Last 24 hours) at 05/18/2020 1621 Last data filed at 05/17/2020 1721 Gross per 24 hour  Intake 250 ml  Output --   Net 250 ml     Physical Exam  Gen:- Awake Alert, in no acute distress HEENT:- [place].AT, No sclera icterus Neck-Supple Neck,No JVD,.  Lungs-  CTAB , fair symmetrical air movement CV- S1, S2 normal, regular  Abd-  +ve B.Sounds, Abd Soft, No tenderness, rather large ventral hernia extending to the right flank Extremity/Skin:- No  edema, pedal pulses present  Psych-affect is appropriate, oriented x3 Neuro-no new focal deficits, no tremors   Data Review:   Micro Results Recent Results (from the past 240 hour(s))  SARS Coronavirus 2 by RT PCR (hospital order, performed in [facility] hospital lab) Nasopharyngeal Nasopharyngeal  Swab     Status: None   Collection Time: 05/17/20  6:02 PM   Specimen: Nasopharyngeal Swab  Result Value Ref Range Status   SARS Coronavirus 2 NEGATIVE NEGATIVE Final    Comment: (NOTE) SARS-CoV-2 target nucleic acids are NOT DETECTED. The SARS-CoV-2 RNA is generally detectable in upper and lower respiratory specimens during the acute phase of infection. The lowest concentration of SARS-CoV-2 viral copies this assay can detect is 250 copies / mL. A negative result does not preclude SARS-CoV-2 infection and should not be used as the sole basis for treatment or other patient management decisions.  A negative result may occur with improper specimen collection / handling, submission of specimen other than nasopharyngeal swab, presence of viral mutation(s) within the areas targeted by this assay, and inadequate number of viral copies (<250 copies / mL). A negative result must be combined with clinical observations, patient history, and epidemiological information. Fact Sheet for Patients:   BoilerBrush.com.cy Fact Sheet for Healthcare Providers: https://pope.com/ This test is not yet approved or cleared  by the [place] FDA and has been authorized for detection and/or diagnosis of SARS-CoV-2 by FDA under an  Emergency Use Authorization (EUA).  This EUA will remain in effect (meaning this test can be used) for the duration of the COVID-19 declaration under Section 564(b)(1) of the Act, 21 U.S.C. section 360bbb-3(b)(1), unless the authorization is terminated or revoked sooner. Performed at [facility], [address] [place], [place] [address]     Radiology Reports NM Myocar Multi W/Spect W/Wall Motion / EF  Result Date: 05/18/2020  Mild progression of baseline inferior and lateral ST/T changes after regadenoson  Chest pain after regadenoson, mild improvement with sublignual nitroglycerin x 2  Findings consistent with large inferior/inferoapical/inferolateral prior myocardial infarction with high degree of peri-infarct ischemia. Large inferoseptal/septal infarct with high degree of peri-infarct ischemia. Overall SSS 25, SRS 6, SDS 19  This is a high risk study.  The left ventricular ejection fraction is normal (55-65%).    DG Chest Portable 1 View  Result Date: 05/17/2020 CLINICAL DATA:  Chest pain. EXAM: PORTABLE CHEST 1 VIEW COMPARISON:  07/27/2017. FINDINGS: Mediastinum hilar structures normal. Mild left base subsegmental atelectasis/infiltrate. Small left pleural effusion. No pneumothorax. Cardiomegaly. No pulmonary venous congestion. Degenerative change thoracic spine. IMPRESSION: Mild left base subsegmental atelectasis/infiltrate. Electronically Signed   By: Maisie Fus  Register   On: 05/17/2020 16:19   ECHOCARDIOGRAM COMPLETE  Result Date: 05/18/2020    ECHOCARDIOGRAM REPORT   Patient Name:   [name] [name] Date of Exam: 05/18/2020 Medical Rec #:  [id number]      Height:       59.0 in Accession #:    [id number]     Weight:       157.8 lb Date of Birth:  12-02-1941     BSA:          1.668 m Patient Age:    78 years       BP:           122/56 mmHg Patient Gender: F              HR:           65 bpm. [facility] Procedure: 2D Echo Indications:    Chest Pain 786.50 / R07.9  History:         Patient has no prior history of Echocardiogram examinations.  COPD, Signs/Symptoms:Chest Pain; Risk Factors:Diabetes and                 Non-Smoker. GERD.  Sonographer:    Jeryl ColumbiaJohanna Elliott RDCS (AE) Referring Phys: [id number] Heloise BeechamEJIROGHENE E [facility] Healthcare[facility]OKPAE IMPRESSIONS  1. Left ventricular ejection fraction, by estimation, is 55 to 60%. The left ventricle has normal function. The left ventricle has no regional wall motion abnormalities. There is mild left ventricular hypertrophy. Left ventricular diastolic parameters are indeterminate.  2. Right ventricular systolic function is normal. The right ventricular size is normal.  3. Left atrial size was mildly dilated.  4. The mitral valve is normal in structure. Trivial mitral valve regurgitation. No evidence of mitral stenosis.  5. The aortic valve has an indeterminant number of cusps. Aortic valve regurgitation is not visualized. No aortic stenosis is present.  6. The inferior vena cava is normal in size with greater than 50% respiratory variability, suggesting right atrial pressure of 3 mmHg. FINDINGS  Left Ventricle: Left ventricular ejection fraction, by estimation, is 55 to 60%. The left ventricle has normal function. The left ventricle has no regional wall motion abnormalities. The left ventricular internal cavity size was normal in size. There is  mild left ventricular hypertrophy. Left ventricular diastolic parameters are indeterminate. Right Ventricle: The right ventricular size is normal. No increase in right ventricular wall thickness. Right ventricular systolic function is normal. Left Atrium: Left atrial size was mildly dilated. Right Atrium: Right atrial size was normal in size. Pericardium: There is no evidence of pericardial effusion. Mitral Valve: The mitral valve is normal in structure. Trivial mitral valve regurgitation. No evidence of mitral valve stenosis. Tricuspid Valve: The tricuspid valve is normal in structure. Tricuspid valve regurgitation is  trivial. No evidence of tricuspid stenosis. Aortic Valve: The aortic valve has an indeterminant number of cusps. Aortic valve regurgitation is not visualized. No aortic stenosis is present. Aortic valve mean gradient measures 4.2 mmHg. Aortic valve peak gradient measures 7.3 mmHg. Aortic valve area, by VTI measures 1.57 cm. Pulmonic Valve: The pulmonic valve was not well visualized. Pulmonic valve regurgitation is not visualized. No evidence of pulmonic stenosis. Aorta: The aortic root is normal in size and structure. Pulmonary Artery: Indeterminant PASP, inadequate TR jet. Venous: The inferior vena cava is normal in size with greater than 50% respiratory variability, suggesting right atrial pressure of 3 mmHg. IAS/Shunts: No atrial level shunt detected by color flow Doppler.  LEFT VENTRICLE PLAX 2D LVIDd:         4.57 cm  Diastology LVIDs:         3.14 cm  LV e' lateral:   8.60 cm/s LV PW:         1.20 cm  LV E/e' lateral: 9.2 LV IVS:        1.17 cm  LV e' medial:    6.49 cm/s LVOT diam:     1.90 cm  LV E/e' medial:  12.2 LV SV:         50 LV SV Index:   30 LVOT Area:     2.84 cm  RIGHT VENTRICLE RV S prime:     11.10 cm/s TAPSE (M-mode): 1.9 cm LEFT ATRIUM             Index       RIGHT ATRIUM           Index LA diam:        3.30 cm 1.98 cm/m  RA Area:     13.40 cm LA  Vol [facility]):   40.6 ml 24.35 ml/m RA Volume:   33.80 ml  20.27 ml/m LA Vol (A4C):   52.8 ml 31.66 ml/m LA Biplane Vol: 49.8 ml 29.86 ml/m  AORTIC VALVE AV Area (Vmax):    1.56 cm AV Area (Vmean):   1.41 cm AV Area (VTI):     1.57 cm AV Vmax:           134.65 cm/s AV Vmean:          99.831 cm/s AV VTI:            0.320 m AV Peak Grad:      7.3 mmHg AV Mean Grad:      4.2 mmHg LVOT Vmax:         74.21 cm/s LVOT Vmean:        49.672 cm/s LVOT VTI:          0.177 m LVOT/AV VTI ratio: 0.55  AORTA Ao Root diam: 2.50 cm MITRAL VALVE MV Area (PHT): 2.91 cm    SHUNTS MV Decel Time: 261 msec    Systemic VTI:  0.18 m MV E velocity: 79.10 cm/s  Systemic  Diam: 1.90 cm MV A velocity: 66.90 cm/s MV E/A ratio:  1.18 Dina Rich MD Electronically signed by Dina Rich MD Signature Date/Time: 05/18/2020/9:43:37 AM    Final      CBC Recent Labs  Lab 05/17/20 1538  WBC 8.4  HGB 11.3*  HCT 34.2*  PLT 264  MCV 95.5  MCH 31.6  MCHC 33.0  RDW 12.1  LYMPHSABS 1.6  MONOABS 0.5  EOSABS 0.2  BASOSABS 0.1    Chemistries  Recent Labs  Lab 05/17/20 1538 05/17/20 1539  NA 137  --   K 4.4  --   CL 105  --   CO2 22  --   GLUCOSE 121*  --   BUN 25*  --   CREATININE 1.40*  --   CALCIUM 9.1  --   AST  --  17  ALT  --  18  ALKPHOS  --  48  BILITOT  --  0.4   ------------------------------------------------------------------------------------------------------------------ Recent Labs    05/18/20 0341  CHOL 159  HDL 29*  LDLCALC 73  TRIG 537*  CHOLHDL 5.5    Lab Results  Component Value Date   HGBA1C 6.8 (H) 03/07/2020   ------------------------------------------------------------------------------------------------------------------ No results for input(s): TSH, T4TOTAL, T3FREE, THYROIDAB in the last 72 hours.  Invalid input(s): FREET3 ------------------------------------------------------------------------------------------------------------------ No results for input(s): VITAMINB12, FOLATE, FERRITIN, TIBC, IRON, RETICCTPCT in the last 72 hours.  Coagulation profile No results for input(s): INR, PROTIME in the last 168 hours.  No results for input(s): DDIMER in the last 72 hours.  Cardiac Enzymes No results for input(s): CKMB, TROPONINI, MYOGLOBIN in the last 168 hours.  Invalid input(s): CK ------------------------------------------------------------------------------------------------------------------ No results found for: BNP   Shon Hale M.D on 05/18/2020 at 4:21 PM  Go to [email or web address] - for contact info  [facility] - Office  [phone]

## 2020-05-18 NOTE — Plan of Care (Signed)
[place], [place] (Cardiology) at bedside discussing patient plan of care.

## 2020-05-19 DIAGNOSIS — R9439 Abnormal result of other cardiovascular function study: Secondary | ICD-10-CM

## 2020-05-19 LAB — CBC
HCT: 33.9 % — ABNORMAL LOW (ref 36.0–46.0)
Hemoglobin: 11.3 g/dL — ABNORMAL LOW (ref 12.0–15.0)
MCH: 31.5 pg (ref 26.0–34.0)
MCHC: 33.3 g/dL (ref 30.0–36.0)
MCV: 94.4 fL (ref 80.0–100.0)
Platelets: 257 10*3/uL (ref 150–400)
RBC: 3.59 MIL/uL — ABNORMAL LOW (ref 3.87–5.11)
RDW: 12.2 % (ref 11.5–15.5)
WBC: 9.2 10*3/uL (ref 4.0–10.5)
nRBC: 0 % (ref 0.0–0.2)

## 2020-05-19 LAB — GLUCOSE, CAPILLARY
Glucose-Capillary: 176 mg/dL — ABNORMAL HIGH (ref 70–99)
Glucose-Capillary: 256 mg/dL — ABNORMAL HIGH (ref 70–99)
Glucose-Capillary: 126 mg/dL — ABNORMAL HIGH (ref 70–99)
Glucose-Capillary: 261 mg/dL — ABNORMAL HIGH (ref 70–99)

## 2020-05-19 LAB — HEPARIN LEVEL (UNFRACTIONATED)
Heparin Unfractionated: 0.4 IU/mL (ref 0.30–0.70)
Heparin Unfractionated: 0.45 IU/mL (ref 0.30–0.70)

## 2020-05-19 LAB — PROCALCITONIN: Procalcitonin: <0.1 ng/mL

## 2020-05-19 NOTE — Progress Notes (Signed)
ANTICOAGULATION CONSULT NOTE   Pharmacy Consult for:  heparin dosing Indication: ACS/STEMI  Allergies  Allergen Reactions  . Contrast Media [Iodinated Diagnostic Agents]     Unknown reaction  . Morphine And Related   . Other     Pt has multiple allergies per pt but unsure of all.   . Penicillins     Unknown reaction  . Codeine Rash    Patient Measurements: Height: 4' 11.02" (149.9 cm) Weight: 71 kg (156 lb 8.4 oz) IBW/kg (Calculated) : 43.24 Heparin Dosing Weight:  HEPARIN DW (KG): 59.1 HEPARIN DW (KG): 59.1  Vital Signs: Temp: 98 F (36.7 C) (06/05 0505) Temp Source: Oral (06/05 0505) BP: 128/57 (06/05 0505) Pulse Rate: 64 (06/05 0505)  Labs: Recent Labs    05/17/20 1538 05/17/20 1722 05/17/20 1951 05/18/20 1255 05/18/20 1452 05/18/20 2102 05/19/20 0700  HGB 11.3*  --   --   --   --   --  11.3*  HCT 34.2*  --   --   --   --   --  33.9*  PLT 264  --   --   --   --   --  257  HEPARINUNFRC  --   --   --   --   --  0.12* 0.40  CREATININE 1.40*  --   --   --   --   --   --   TROPONINIHS 9   < > 13 9 14   --   --    < > = values in this interval not displayed.   Estimated Creatinine Clearance: 28.4 mL/min (A) (by C-G formula based on SCr of 1.4 mg/dL (H)).    Assessment: Pharmacy consulted to dose heparin infusion for this   79 yo female with  chest pain.    Baseline  Hb  11.3 and platelets are WNL.  Patient wasn't taking any anti-coagulant medications prior to admission, but she did receive a dose of prophylactic Lovenox 40mg  at 0151 on 6/4.  Heparin level 0.4, therapeutic    Goal of Therapy:  Heparin level 0.3-0.7 units/ml Monitor platelets by anticoagulation protocol: Yes   Plan:  Continue heparin drip @ 950 units/hr Heparin level in ~6 hours and daily  Monitor for bleeding  Jacky Hartung 05/19/2020,8:54 AM

## 2020-05-19 NOTE — Progress Notes (Addendum)
Progress Note  Patient Name: [name] [name] Date of Encounter: 05/19/2020  Primary Cardiologist: Dina Rich, MD  Subjective   Patient seen after arriving to [facility]. She provided detail-oriented narrative of presenting symptoms, but feeling better now - no chest pain today.  Inpatient Medications    Scheduled Meds: . aspirin EC  81 mg Oral QPM  . atorvastatin  40 mg Oral Daily  . doxycycline  100 mg Oral Q12H  . guaiFENesin  600 mg Oral BID  . insulin aspart  0-5 Units Subcutaneous QHS  . insulin aspart  0-9 Units Subcutaneous TID WC  . losartan  25 mg Oral q morning - 10a   Continuous Infusions: . cefTRIAXone (ROCEPHIN)  IV Stopped (05/18/20 04-23-04)  . heparin 950 Units/hr (05/19/20 0430)   PRN Meds: acetaminophen **OR** acetaminophen, nitroGLYCERIN, ondansetron **OR** ondansetron (ZOFRAN) IV, polyethylene glycol   Vital Signs    Vitals:   05/18/20 2030 05/19/20 0041 05/19/20 0505 05/19/20 1030  BP: (!) 146/62 132/65 (!) 128/57 121/63  Pulse: 67 67 64 70  Resp: 16 16 16 18   Temp: 98.4 F (36.9 C) 98.2 F (36.8 C) 98 F (36.7 C)   TempSrc: Oral Oral Oral   SpO2: 100% 99% 97% 99%  Weight:      Height:        Intake/Output Summary (Last 24 hours) at 05/19/2020 1552 Last data filed at 05/19/2020 1115 Gross per 24 hour  Intake 598.05 ml  Output 351 ml  Net 247.05 ml   Last 3 Weights 05/18/2020 03/07/2020 12/09/2017  Weight (lbs) 156 lb 8.4 oz 157 lb 12.8 oz 146 lb 1 oz  Weight (kg) 71 kg 71.578 kg 66.254 kg     Telemetry    NSR - Personally Reviewed  ECG    05/17/20 -NSR 91bpm, ST depression/downsloped TW inferiorly as well as in V5-V6, subtle ST concaved pattern V1, similar to 04-23-17 but slightly more pronounced - Personally Reviewed  Physical Exam   GEN: No acute distress.  HEENT: Normocephalic, atraumatic, sclera non-icteric. Neck: No JVD or bruits. Cardiac: RRR no murmurs, rubs, or gallops.  Radials/DP/PT 1+ and equal bilaterally.  Respiratory:  Clear to auscultation bilaterally. Breathing is unlabored. GI: Soft, nontender, very large ventral hernia noted, BS +x 4. MS: no deformity. Extremities: No clubbing or cyanosis. No edema. Distal pedal pulses are 2+ and equal bilaterally. Neuro:  AAOx3. Follows commands. Psych:  Responds to questions appropriately with a normal affect.  Labs    High Sensitivity Troponin:   Recent Labs  Lab 05/17/20 1538 05/17/20 1722 05/17/20 1951 05/18/20 1255 05/18/20 1452  TROPONINIHS 9 12 13 9 14       Cardiac EnzymesNo results for input(s): TROPONINI in the last 168 hours. No results for input(s): TROPIPOC in the last 168 hours.   Chemistry Recent Labs  Lab 05/17/20 1538 05/17/20 1539  NA 137  --   K 4.4  --   CL 105  --   CO2 22  --   GLUCOSE 121*  --   BUN 25*  --   CREATININE 1.40*  --   CALCIUM 9.1  --   PROT  --  7.0  ALBUMIN  --  3.8  AST  --  17  ALT  --  18  ALKPHOS  --  48  BILITOT  --  0.4  GFRNONAA 36*  --   GFRAA 42*  --   ANIONGAP 10  --      Hematology Recent Labs  Lab 05/17/20 1538 05/19/20 0700  WBC 8.4 9.2  RBC 3.58* 3.59*  HGB 11.3* 11.3*  HCT 34.2* 33.9*  MCV 95.5 94.4  MCH 31.6 31.5  MCHC 33.0 33.3  RDW 12.1 12.2  PLT 264 257    BNPNo results for input(s): BNP, PROBNP in the last 168 hours.   DDimer No results for input(s): DDIMER in the last 168 hours.   Radiology    NM Myocar Multi W/Spect W/Wall Motion / EF  Result Date: 05/18/2020  Mild progression of baseline inferior and lateral ST/T changes after regadenoson  Chest pain after regadenoson, mild improvement with sublignual nitroglycerin x 2  Findings consistent with large inferior/inferoapical/inferolateral prior myocardial infarction with high degree of peri-infarct ischemia. Large inferoseptal/septal infarct with high degree of peri-infarct ischemia. Overall SSS 25, SRS 6, SDS 19  This is a high risk study.  The left ventricular ejection fraction is normal (55-65%).     ECHOCARDIOGRAM COMPLETE  Result Date: 05/18/2020    ECHOCARDIOGRAM REPORT   Patient Name:   [name] [name] Date of Exam: 05/18/2020 Medical Rec #:  [id number]      Height:       59.0 in Accession #:    [id number]     Weight:       157.8 lb Date of Birth:  1941/07/20     BSA:          1.668 m Patient Age:    79 years       BP:           122/56 mmHg Patient Gender: F              HR:           65 bpm. Exam Location:  [facility] Procedure: 2D Echo Indications:    Chest Pain 786.50 / R07.9  History:        Patient has no prior history of Echocardiogram examinations.                 COPD, Signs/Symptoms:Chest Pain; Risk Factors:Diabetes and                 Non-Smoker. GERD.  Sonographer:    Leavy Cella RDCS (AE) Referring Phys: [place]  1. Left ventricular ejection fraction, by estimation, is 55 to 60%. The left ventricle has normal function. The left ventricle has no regional wall motion abnormalities. There is mild left ventricular hypertrophy. Left ventricular diastolic parameters are indeterminate.  2. Right ventricular systolic function is normal. The right ventricular size is normal.  3. Left atrial size was mildly dilated.  4. The mitral valve is normal in structure. Trivial mitral valve regurgitation. No evidence of mitral stenosis.  5. The aortic valve has an indeterminant number of cusps. Aortic valve regurgitation is not visualized. No aortic stenosis is present.  6. The inferior vena cava is normal in size with greater than 50% respiratory variability, suggesting right atrial pressure of 3 mmHg. FINDINGS  Left Ventricle: Left ventricular ejection fraction, by estimation, is 55 to 60%. The left ventricle has normal function. The left ventricle has no regional wall motion abnormalities. The left ventricular internal cavity size was normal in size. There is  mild left ventricular hypertrophy. Left ventricular diastolic parameters are indeterminate. Right Ventricle: The right  ventricular size is normal. No increase in right ventricular wall thickness. Right ventricular systolic function is normal. Left Atrium: Left atrial size was mildly dilated. Right Atrium: Right atrial size  was normal in size. Pericardium: There is no evidence of pericardial effusion. Mitral Valve: The mitral valve is normal in structure. Trivial mitral valve regurgitation. No evidence of mitral valve stenosis. Tricuspid Valve: The tricuspid valve is normal in structure. Tricuspid valve regurgitation is trivial. No evidence of tricuspid stenosis. Aortic Valve: The aortic valve has an indeterminant number of cusps. Aortic valve regurgitation is not visualized. No aortic stenosis is present. Aortic valve mean gradient measures 4.2 mmHg. Aortic valve peak gradient measures 7.3 mmHg. Aortic valve area, by VTI measures 1.57 cm. Pulmonic Valve: The pulmonic valve was not well visualized. Pulmonic valve regurgitation is not visualized. No evidence of pulmonic stenosis. Aorta: The aortic root is normal in size and structure. Pulmonary Artery: Indeterminant PASP, inadequate TR jet. Venous: The inferior vena cava is normal in size with greater than 50% respiratory variability, suggesting right atrial pressure of 3 mmHg. IAS/Shunts: No atrial level shunt detected by color flow Doppler.  LEFT VENTRICLE PLAX 2D LVIDd:         4.57 cm  Diastology LVIDs:         3.14 cm  LV e' lateral:   8.60 cm/s LV PW:         1.20 cm  LV E/e' lateral: 9.2 LV IVS:        1.17 cm  LV e' medial:    6.49 cm/s LVOT diam:     1.90 cm  LV E/e' medial:  12.2 LV SV:         50 LV SV Index:   30 LVOT Area:     2.84 cm  RIGHT VENTRICLE RV S prime:     11.10 cm/s TAPSE (M-mode): 1.9 cm LEFT ATRIUM             Index       RIGHT ATRIUM           Index LA diam:        3.30 cm 1.98 cm/m  RA Area:     13.40 cm LA Vol (A2C):   40.6 ml 24.35 ml/m RA Volume:   33.80 ml  20.27 ml/m LA Vol (A4C):   52.8 ml 31.66 ml/m LA Biplane Vol: 49.8 ml 29.86 ml/m  AORTIC  VALVE AV Area (Vmax):    1.56 cm AV Area (Vmean):   1.41 cm AV Area (VTI):     1.57 cm AV Vmax:           134.65 cm/s AV Vmean:          99.831 cm/s AV VTI:            0.320 m AV Peak Grad:      7.3 mmHg AV Mean Grad:      4.2 mmHg LVOT Vmax:         74.21 cm/s LVOT Vmean:        49.672 cm/s LVOT VTI:          0.177 m LVOT/AV VTI ratio: 0.55  AORTA Ao Root diam: 2.50 cm MITRAL VALVE MV Area (PHT): 2.91 cm    SHUNTS MV Decel Time: 261 msec    Systemic VTI:  0.18 m MV E velocity: 79.10 cm/s  Systemic Diam: 1.90 cm MV A velocity: 66.90 cm/s MV E/A ratio:  1.18 Dina Rich MD Electronically signed by Dina Rich MD Signature Date/Time: 05/18/2020/9:43:37 AM    Final     Cardiac Studies   NST 05/18/20  Mild progression of baseline inferior and lateral ST/T changes  after regadenoson  Chest pain after regadenoson, mild improvement with sublignual nitroglycerin x 2  Findings consistent with large inferior/inferoapical/inferolateral prior myocardial infarction with high degree of peri-infarct ischemia. Large inferoseptal/septal infarct with high degree of peri-infarct ischemia. Overall SSS 25, SRS 6, SDS 19  This is a high risk study.  The left ventricular ejection fraction is normal (55-65%).     2D echo 05/18/20 1. Left ventricular ejection fraction, by estimation, is 55 to 60%. The  left ventricle has normal function. The left ventricle has no regional  wall motion abnormalities. There is mild left ventricular hypertrophy.  Left ventricular diastolic parameters  are indeterminate.  2. Right ventricular systolic function is normal. The right ventricular  size is normal.  3. Left atrial size was mildly dilated.  4. The mitral valve is normal in structure. Trivial mitral valve  regurgitation. No evidence of mitral stenosis.  5. The aortic valve has an indeterminant number of cusps. Aortic valve  regurgitation is not visualized. No aortic stenosis is present.  6. The inferior vena  cava is normal in size with greater than 50%  respiratory variability, suggesting right atrial pressure of 3 mmHg.   Patient Profile     79 y.o. female with past medical history of HTN, HLD, Stage 3 CKD (s/p L nephrectomy in 1970's complicated by abdominal wall hernia) and Type 2 DM who was transferred from [facility] with chest pain, negative troponin, abnormal EKG and abnormal stress test. Also possible L sided PNA.  Assessment & Plan    1. Chest pain with mixed features - NST abnormal at [facility]. Dr. Wyline Mood recommended cath on Monday. Chart Review/schedule indicates procedure is already scheduled. Reviewed procedure again with patient today. Risks and benefits of cardiac catheterization have been discussed with the patient.  These include bleeding, infection, kidney damage, stroke, heart attack, death.  The patient understands these risks and is willing to proceed. Brittainy Strader wrote cath orders already which included dye allergy prophylaxis. Remains CP free. Continue previously-started ASA, statin, heparin. Not currently on BB with HR the 60s but can consider if significant CAD on cath.  2. Essential HTN - BP controlled presently. Cr was slightly higher on admit than previous values. Will hold ARB in anticipation of cath; can use alternative agent if needed.  3. CKD stage III - hold ARB in anticipation of cath and trend values.  4. HLD - LDL 73 this admission; statin added. If the patient is tolerating statin at time of follow-up appointment, would consider rechecking liver function/lipid panel in 6-8 weeks.  5. Possible L sided PNA - on abx per IM. Covid test negative.  For questions or updates, please contact [facility] Please consult [email or web address] for contact info under Cardiology/STEMI.  Signed, Laurann Montana, PA-C 05/19/2020, 3:52 PM    Cardiology Attending  Patient is transferred from [facility] for cardiac cath after experiencing an episode of unstable angina. She will undergo left  heart cath on Monday.  Leonia Reeves.D.

## 2020-05-19 NOTE — Progress Notes (Signed)
Pt transported via stretcher by [facility] to [facility] [address]. Pt's daughter [name] notified and states understanding.

## 2020-05-19 NOTE — Progress Notes (Signed)
Patient Demographics:    [name] [name], is a 79 y.o. female, DOB - September 14, 1941, UXL:[id number]  Admit date - 05/17/2020   Admitting Physician Ejiroghene Wendall StadeE Risa Auman, MD  Outpatient Primary MD for the patient is Corum, Minerva FesterLisa L, MD  LOS - 1   Chief Complaint  Patient presents with  . Chest Pain        Subjective:    [name] [name] today has no fevers, no emesis,   -Patient remains chest pain-free on IV heparin drip -No bleeding concerns  Assessment  & Plan :    Principal Problem:   Abnormal cardiovascular stress test Active Problems:   Chest pain   Diabetes mellitus (HCC)   Major depressive disorder, single episode, unspecified   Ventral hernia without obstruction or gangrene   Chronic obstructive pulmonary disease, unspecified (HCC)  Brief Summary:- 79 y.o. female with past medical history of HTN, HLD, Stage 3 bCKD (s/p L nephrectomy in 1970's complicated by abdominal wall hernia) and Type 2 DM admitted on 05/17/2020 with atypical chest pain, stress test is high risk, needs LHC however patient has contrast allergy, [facility] LHC on 05/19/20  A/p 1)Chest Pain--with atypical features--- currently chest pain-free,  cardiovascular risk factors include age, status post menopause, sedentary lifestyle, HTN, HLD, family history of CAD (father died of MI at age 959)---- -stress test is abnormal high risk (see below) --- patient has contrast allergy, [facility] -[facility]-Chest pain currently has resolved, if chest pain reoccurs and persist consider nitro if BP allows -c/n V heparin, aspirin and Lipitor as ordered -Cardiology input appreciated -Echo with preserved EF of 55 to 60%, without wall motion abnormalities,  2)AKI----acute kidney injury on CKD stage - 3b  creatinine on  admission=1.40  , baseline creatinine =  1.3 (02/2020)   ,  renally adjust medications, avoid nephrotoxic agents / dehydration  / hypotension -Monitor renal function closely especially due to pending LHC -Hold Metformin -hold  losartan prior to LHC (EF is WNL) -she is s/p L nephrectomy in 1970's complicated by abdominal wall hernia  3)DM2----A1c 6.8 reflecting excellent diabetic control PTA, hold Metformin due to AKI on CKD and pending contrast exposure with LHC - Use Novolog/Humalog Sliding scale insulin with Accu-Cheks/Fingersticks as ordered   4)Possible Lt sided PNA--- this is a " soft" diagnosis, -Currently no fevers, no leukocytosis and PCT is not elevated -Currently on Doxy and rocephin mucolytics and as needed bronchodilators -Consider discontinuing antibiotics after cardiac cath on 05/21/2020-  5) hyperlipidemia-fasting lipid profile from March 2021 noted, LDL was 109 and HDL was 38, PTA patient was already on fish oil, Lipitor 40 mg daily has been added this admission  6) COPD--- stable, no acute exacerbation despite #4 above, management as above #4 -No indication for steroids at this time   Disposition/Need for in-Hospital Stay- patient unable to be discharged at this time due to --chest pain with abnormal stress test requires LHC currently on IV heparin  Status is: Inpatient  Remains inpatient appropriate because:Ongoing diagnostic testing needed not appropriate for outpatient work up, IV treatments appropriate due to intensity of illness or  inability to take PO and Inpatient level of care appropriate due to severity of illness   Dispo: The patient is from: Home              Anticipated d/c is to: Home              Anticipated d/c date is: > 3 days              Patient currently is not medically stable to d/c.  Barriers: Not Clinically Stable- --Currently on IV heparin awaiting LHC on 05/21/2020   Procedures:- Nuclear stress test 05/18/2020-  Mild progression of baseline  inferior and lateral ST/T changes after regadenoson  Chest pain after regadenoson, mild improvement with sublignual nitroglycerin x 2  Findings consistent with large inferior/inferoapical/inferolateral prior myocardial infarction with high degree of peri-infarct ischemia. Large inferoseptal/septal infarct with high degree of peri-infarct ischemia. Overall SSS 25, SRS 6, SDS 19  This is a high risk study.  The left ventricular ejection fraction is normal (55-65%).  05/21/20-- LHC planned  Code Status : full  Family Communication:   NA (patient is alert, awake and coherent) -Previously discussed with daughter  Consults  :  cardiology  DVT Prophylaxis  : IV heparin  Lab Results  Component Value Date   PLT 257 05/19/2020    Inpatient Medications  Scheduled Meds: . aspirin EC  81 mg Oral QPM  . atorvastatin  40 mg Oral Daily  . doxycycline  100 mg Oral Q12H  . guaiFENesin  600 mg Oral BID  . insulin aspart  0-5 Units Subcutaneous QHS  . insulin aspart  0-9 Units Subcutaneous TID WC   Continuous Infusions: . cefTRIAXone (ROCEPHIN)  IV Stopped (05/18/20 2105)  . heparin 950 Units/hr (05/19/20 0430)   PRN Meds:.acetaminophen **OR** acetaminophen, nitroGLYCERIN, ondansetron **OR** ondansetron (ZOFRAN) IV, polyethylene glycol    Anti-infectives (From admission, onward)   Start     Dose/Rate Route Frequency Ordered Stop   05/18/20 2200  doxycycline (VIBRA-TABS) tablet 100 mg     100 mg Oral Every 12 hours 05/18/20 1620     05/18/20 1700  cefTRIAXone (ROCEPHIN) 1 g in sodium chloride 0.9 % 100 mL IVPB     1 g 200 mL/hr over 30 Minutes Intravenous Every 24 hours 05/18/20 1620          Objective:   Vitals:   05/19/20 0041 05/19/20 0505 05/19/20 1030 05/19/20 1601  BP: 132/65 (!) 128/57 121/63 138/64  Pulse: 67 64 70 80  Resp: 16 16 18  (!) 24  Temp: 98.2 F (36.8 C) 98 F (36.7 C)  98.3 F (36.8 C)  TempSrc: Oral Oral  Oral  SpO2: 99% 97% 99% 99%  Weight:    70.8 kg    Height:    4\' 11"  (1.499 m)    Wt Readings from Last 3 Encounters:  05/19/20 70.8 kg  03/07/20 71.6 kg  12/09/17 66.3 kg     Intake/Output Summary (Last 24 hours) at 05/19/2020 1638 Last data filed at 05/19/2020 1625 Gross per 24 hour  Intake 598.05 ml  Output 551 ml  Net 47.05 ml     Physical Exam  Gen:- Awake Alert, in no acute distress HEENT:- [place].AT, No sclera icterus Neck-Supple Neck,No JVD,.  Lungs-  CTAB , fair symmetrical air movement CV- S1, S2 normal, regular  Abd-  +ve B.Sounds, Abd Soft, No tenderness, rather large ventral hernia extending to the right flank Extremity/Skin:- No  edema, pedal pulses present  Psych-affect is  appropriate, oriented x3 Neuro-no new focal deficits, no tremors   Data Review:   Micro Results Recent Results (from the past 240 hour(s))  SARS Coronavirus 2 by RT PCR (hospital order, performed in [facility] hospital lab) Nasopharyngeal Nasopharyngeal Swab     Status: None   Collection Time: 05/17/20  6:02 PM   Specimen: Nasopharyngeal Swab  Result Value Ref Range Status   SARS Coronavirus 2 NEGATIVE NEGATIVE Final    Comment: (NOTE) SARS-CoV-2 target nucleic acids are NOT DETECTED. The SARS-CoV-2 RNA is generally detectable in upper and lower respiratory specimens during the acute phase of infection. The lowest concentration of SARS-CoV-2 viral copies this assay can detect is 250 copies / mL. A negative result does not preclude SARS-CoV-2 infection and should not be used as the sole basis for treatment or other patient management decisions.  A negative result may occur with improper specimen collection / handling, submission of specimen other than nasopharyngeal swab, presence of viral mutation(s) within the areas targeted by this assay, and inadequate number of viral copies (<250 copies / mL). A negative result must be combined with clinical observations, patient history, and epidemiological information. Fact Sheet for Patients:    BoilerBrush.com.cy Fact Sheet for Healthcare Providers: https://pope.com/ This test is not yet approved or cleared  by the [place] FDA and has been authorized for detection and/or diagnosis of SARS-CoV-2 by FDA under an Emergency Use Authorization (EUA).  This EUA will remain in effect (meaning this test can be used) for the duration of the COVID-19 declaration under Section 564(b)(1) of the Act, 21 U.S.C. section 360bbb-3(b)(1), unless the authorization is terminated or revoked sooner. Performed at [facility], [address], [place], [place] [address]     Radiology Reports NM Myocar Multi W/Spect W/Wall Motion / EF  Result Date: 05/18/2020  Mild progression of baseline inferior and lateral ST/T changes after regadenoson  Chest pain after regadenoson, mild improvement with sublignual nitroglycerin x 2  Findings consistent with large inferior/inferoapical/inferolateral prior myocardial infarction with high degree of peri-infarct ischemia. Large inferoseptal/septal infarct with high degree of peri-infarct ischemia. Overall SSS 25, SRS 6, SDS 19  This is a high risk study.  The left ventricular ejection fraction is normal (55-65%).    DG Chest Portable 1 View  Result Date: 05/17/2020 CLINICAL DATA:  Chest pain. EXAM: PORTABLE CHEST 1 VIEW COMPARISON:  07/27/2017. FINDINGS: Mediastinum hilar structures normal. Mild left base subsegmental atelectasis/infiltrate. Small left pleural effusion. No pneumothorax. Cardiomegaly. No pulmonary venous congestion. Degenerative change thoracic spine. IMPRESSION: Mild left base subsegmental atelectasis/infiltrate. Electronically Signed   By: Maisie Fus  Register   On: 05/17/2020 16:19   ECHOCARDIOGRAM COMPLETE  Result Date: 05/18/2020    ECHOCARDIOGRAM REPORT   Patient Name:   [name] [name] Date of Exam: 05/18/2020 Medical Rec #:  [id number]      Height:       59.0 in Accession #:    [id number]     Weight:        157.8 lb Date of Birth:  [birth year]     BSA:          1.668 m Patient Age:    78 years       BP:           122/56 mmHg Patient Gender: F              HR:           65 bpm. [facility]am [facility] Procedure: 2D  Echo Indications:    Chest Pain 786.50 / R07.9  History:        Patient has no prior history of Echocardiogram examinations.                 COPD, Signs/Symptoms:Chest Pain; Risk Factors:Diabetes and                 Non-Smoker. GERD.  Sonographer:    Jeryl Columbia RDCS (AE) Referring Phys: [id number] [facility] IMPRESSIONS  1. Left ventricular ejection fraction, by estimation, is 55 to 60%. The left ventricle has normal function. The left ventricle has no regional wall motion abnormalities. There is mild left ventricular hypertrophy. Left ventricular diastolic parameters are indeterminate.  2. Right ventricular systolic function is normal. The right ventricular size is normal.  3. Left atrial size was mildly dilated.  4. The mitral valve is normal in structure. Trivial mitral valve regurgitation. No evidence of mitral stenosis.  5. The aortic valve has an indeterminant number of cusps. Aortic valve regurgitation is not visualized. No aortic stenosis is present.  6. The inferior vena cava is normal in size with greater than 50% respiratory variability, suggesting right atrial pressure of 3 mmHg. FINDINGS  Left Ventricle: Left ventricular ejection fraction, by estimation, is 55 to 60%. The left ventricle has normal function. The left ventricle has no regional wall motion abnormalities. The left ventricular internal cavity size was normal in size. There is  mild left ventricular hypertrophy. Left ventricular diastolic parameters are indeterminate. Right Ventricle: The right ventricular size is normal. No increase in right ventricular wall thickness. Right ventricular systolic function is normal. Left Atrium: Left atrial size was mildly dilated. Right Atrium: Right atrial size was normal in size.  Pericardium: There is no evidence of pericardial effusion. Mitral Valve: The mitral valve is normal in structure. Trivial mitral valve regurgitation. No evidence of mitral valve stenosis. Tricuspid Valve: The tricuspid valve is normal in structure. Tricuspid valve regurgitation is trivial. No evidence of tricuspid stenosis. Aortic Valve: The aortic valve has an indeterminant number of cusps. Aortic valve regurgitation is not visualized. No aortic stenosis is present. Aortic valve mean gradient measures 4.2 mmHg. Aortic valve peak gradient measures 7.3 mmHg. Aortic valve area, by VTI measures 1.57 cm. Pulmonic Valve: The pulmonic valve was not well visualized. Pulmonic valve regurgitation is not visualized. No evidence of pulmonic stenosis. Aorta: The aortic root is normal in size and structure. Pulmonary Artery: Indeterminant PASP, inadequate TR jet. Venous: The inferior vena cava is normal in size with greater than 50% respiratory variability, suggesting right atrial pressure of 3 mmHg. IAS/Shunts: No atrial level shunt detected by color flow Doppler.  LEFT VENTRICLE PLAX 2D LVIDd:         4.57 cm  Diastology LVIDs:         3.14 cm  LV e' lateral:   8.60 cm/s LV PW:         1.20 cm  LV E/e' lateral: 9.2 LV IVS:        1.17 cm  LV e' medial:    6.49 cm/s LVOT diam:     1.90 cm  LV E/e' medial:  12.2 LV SV:         50 LV SV Index:   30 LVOT Area:     2.84 cm  RIGHT VENTRICLE RV S prime:     11.10 cm/s TAPSE (M-mode): 1.9 cm LEFT ATRIUM  Index       RIGHT ATRIUM           Index LA diam:        3.30 cm 1.98 cm/m  RA Area:     13.40 cm LA Vol (A2C):   40.6 ml 24.35 ml/m RA Volume:   33.80 ml  20.27 ml/m LA Vol (A4C):   52.8 ml 31.66 ml/m LA Biplane Vol: 49.8 ml 29.86 ml/m  AORTIC VALVE AV Area (Vmax):    1.56 cm AV Area (Vmean):   1.41 cm AV Area (VTI):     1.57 cm AV Vmax:           134.65 cm/s AV Vmean:          99.831 cm/s AV VTI:            0.320 m AV Peak Grad:      7.3 mmHg AV Mean Grad:       4.2 mmHg LVOT Vmax:         74.21 cm/s LVOT Vmean:        49.672 cm/s LVOT VTI:          0.177 m LVOT/AV VTI ratio: 0.55  AORTA Ao Root diam: 2.50 cm MITRAL VALVE MV Area (PHT): 2.91 cm    SHUNTS MV Decel Time: 261 msec    Systemic VTI:  0.18 m MV E velocity: 79.10 cm/s  Systemic Diam: 1.90 cm MV A velocity: 66.90 cm/s MV E/A ratio:  1.18 Carlyle Dolly MD Electronically signed by Carlyle Dolly MD Signature Date/Time: 05/18/2020/9:43:37 AM    Final      CBC Recent Labs  Lab 05/17/20 1538 05/19/20 0700  WBC 8.4 9.2  HGB 11.3* 11.3*  HCT 34.2* 33.9*  PLT 264 257  MCV 95.5 94.4  MCH 31.6 31.5  MCHC 33.0 33.3  RDW 12.1 12.2  LYMPHSABS 1.6  --   MONOABS 0.5  --   EOSABS 0.2  --   BASOSABS 0.1  --     Chemistries  Recent Labs  Lab 05/17/20 1538 05/17/20 1539  NA 137  --   K 4.4  --   CL 105  --   CO2 22  --   GLUCOSE 121*  --   BUN 25*  --   CREATININE 1.40*  --   CALCIUM 9.1  --   AST  --  17  ALT  --  18  ALKPHOS  --  48  BILITOT  --  0.4   ------------------------------------------------------------------------------------------------------------------ Recent Labs    05/18/20 0341  CHOL 159  HDL 29*  LDLCALC 73  TRIG 286*  CHOLHDL 5.5    Lab Results  Component Value Date   HGBA1C 6.8 (H) 03/07/2020   ------------------------------------------------------------------------------------------------------------------ No results for input(s): TSH, T4TOTAL, T3FREE, THYROIDAB in the last 72 hours.  Invalid input(s): FREET3 ------------------------------------------------------------------------------------------------------------------ No results for input(s): VITAMINB12, FOLATE, FERRITIN, TIBC, IRON, RETICCTPCT in the last 72 hours.  Coagulation profile No results for input(s): INR, PROTIME in the last 168 hours.  No results for input(s): DDIMER in the last 72 hours.  Cardiac Enzymes No results for input(s): CKMB, TROPONINI, MYOGLOBIN in the last 168  hours.  Invalid input(s): CK ------------------------------------------------------------------------------------------------------------------ No results found for: BNP   Roxan Hockey M.D on 05/19/2020 at 4:38 PM  Go to [email or web address] - for contact info  [facility] - Office  [phone]

## 2020-05-19 NOTE — Progress Notes (Signed)
Pt report given to [name], RN with [facility].

## 2020-05-19 NOTE — Progress Notes (Addendum)
ANTICOAGULATION CONSULT NOTE   Pharmacy Consult for:  heparin dosing Indication: ACS/STEMI  Allergies  Allergen Reactions  . Contrast Media [Iodinated Diagnostic Agents]     Unknown reaction  . Morphine And Related   . Other     Pt has multiple allergies per pt but unsure of all.   . Penicillins     Unknown reaction  . Codeine Rash    Patient Measurements: Height: 4' 11.02" (149.9 cm) Weight: 71 kg (156 lb 8.4 oz) IBW/kg (Calculated) : 43.24 Heparin Dosing Weight:  HEPARIN DW (KG): 59.1 HEPARIN DW (KG): 59.1  Vital Signs: Temp: 98 F (36.7 C) (06/05 0505) Temp Source: Oral (06/05 0505) BP: 121/63 (06/05 1030) Pulse Rate: 70 (06/05 1030)  Labs: Recent Labs    05/17/20 1538 05/17/20 1722 05/17/20 1951 05/18/20 1255 05/18/20 1452 05/18/20 2102 05/19/20 0700 05/19/20 1153  HGB 11.3*  --   --   --   --   --  11.3*  --   HCT 34.2*  --   --   --   --   --  33.9*  --   PLT 264  --   --   --   --   --  257  --   HEPARINUNFRC  --   --   --   --   --  0.12* 0.40 0.45  CREATININE 1.40*  --   --   --   --   --   --   --   TROPONINIHS 9   < > 13 9 14   --   --   --    < > = values in this interval not displayed.   Estimated Creatinine Clearance: 28.4 mL/min (A) (by C-G formula based on SCr of 1.4 mg/dL (H)).    Assessment: Pharmacy consulted to dose heparin infusion for this   79 yo female with  chest pain.    Baseline  Hb  11.3 and platelets are WNL.  Patient wasn't taking any anti-coagulant medications prior to admission, but she did receive a dose of prophylactic Lovenox 40mg  at 0151 on 6/4.  Heparin level 0.45, therapeutic    Goal of Therapy:  Heparin level 0.3-0.7 units/ml Monitor platelets by anticoagulation protocol: Yes   Plan:  Continue heparin drip @ 950 units/hr Heparin level daily  Monitor for bleeding  [name] [name] 05/19/2020,2:58 PM

## 2020-05-20 LAB — BASIC METABOLIC PANEL
Anion gap: 11 (ref 5–15)
BUN: 23 mg/dL (ref 8–23)
CO2: 22 mmol/L (ref 22–32)
Calcium: 8.9 mg/dL (ref 8.9–10.3)
Chloride: 105 mmol/L (ref 98–111)
Creatinine, Ser: 1.26 mg/dL — ABNORMAL HIGH (ref 0.44–1.00)
GFR calc Af Amer: 47 mL/min — ABNORMAL LOW (ref >60)
GFR calc non Af Amer: 41 mL/min — ABNORMAL LOW (ref >60)
Glucose, Bld: 157 mg/dL — ABNORMAL HIGH (ref 70–99)
Potassium: 4.8 mmol/L (ref 3.5–5.1)
Sodium: 138 mmol/L (ref 135–145)

## 2020-05-20 LAB — GLUCOSE, CAPILLARY
Glucose-Capillary: 184 mg/dL — ABNORMAL HIGH (ref 70–99)
Glucose-Capillary: 249 mg/dL — ABNORMAL HIGH (ref 70–99)
Glucose-Capillary: 165 mg/dL — ABNORMAL HIGH (ref 70–99)
Glucose-Capillary: 418 mg/dL — ABNORMAL HIGH (ref 70–99)

## 2020-05-20 LAB — CBC
HCT: 35.7 % — ABNORMAL LOW (ref 36.0–46.0)
Hemoglobin: 11.9 g/dL — ABNORMAL LOW (ref 12.0–15.0)
MCH: 31.4 pg (ref 26.0–34.0)
MCHC: 33.3 g/dL (ref 30.0–36.0)
MCV: 94.2 fL (ref 80.0–100.0)
Platelets: 268 10*3/uL (ref 150–400)
RBC: 3.79 MIL/uL — ABNORMAL LOW (ref 3.87–5.11)
RDW: 12.1 % (ref 11.5–15.5)
WBC: 8.8 10*3/uL (ref 4.0–10.5)
nRBC: 0 % (ref 0.0–0.2)

## 2020-05-20 LAB — PROCALCITONIN: Procalcitonin: <0.1 ng/mL

## 2020-05-20 LAB — HEPARIN LEVEL (UNFRACTIONATED): Heparin Unfractionated: 0.65 IU/mL (ref 0.30–0.70)

## 2020-05-20 MED ORDER — SODIUM CHLORIDE 0.9 % WEIGHT BASED INFUSION: 3.0000 mL/kg/h | INTRAVENOUS | Status: DC

## 2020-05-20 MED ORDER — DIPHENHYDRAMINE HCL 25 MG PO CAPS
50.0000 mg | ORAL_CAPSULE | Freq: Once | ORAL | Status: AC
  Filled 2020-05-20: qty 2

## 2020-05-20 MED ORDER — SODIUM CHLORIDE 0.9% FLUSH: 3.0000 mL | INTRAVENOUS | Status: DC | PRN

## 2020-05-20 MED ORDER — SODIUM CHLORIDE 0.9 % WEIGHT BASED INFUSION
3.0000 mL/kg/h | INTRAVENOUS | Status: DC
  Administered 2020-05-21: 3 mL/kg/h via INTRAVENOUS

## 2020-05-20 MED ORDER — DIPHENHYDRAMINE HCL 50 MG/ML IJ SOLN
50.0000 mg | Freq: Once | INTRAMUSCULAR | Status: AC
  Administered 2020-05-21: 50 mg via INTRAVENOUS
  Filled 2020-05-20: qty 1

## 2020-05-20 MED ORDER — PREDNISONE 20 MG PO TABS
50.0000 mg | ORAL_TABLET | Freq: Four times a day (QID) | ORAL | Status: AC
  Administered 2020-05-20 – 2020-05-21 (×3): 50 mg via ORAL
  Filled 2020-05-20 (×3): qty 2

## 2020-05-20 MED ORDER — SODIUM CHLORIDE 0.9% FLUSH
3.0000 mL | Freq: Two times a day (BID) | INTRAVENOUS | Status: DC
  Administered 2020-05-20 – 2020-05-21 (×2): 3 mL via INTRAVENOUS

## 2020-05-20 MED ORDER — SODIUM CHLORIDE 0.9 % WEIGHT BASED INFUSION: 1.0000 mL/kg/h | INTRAVENOUS | Status: DC

## 2020-05-20 MED ORDER — ASPIRIN 81 MG PO CHEW
81.0000 mg | CHEWABLE_TABLET | ORAL | Status: AC
  Administered 2020-05-21: 81 mg via ORAL
  Filled 2020-05-20: qty 1

## 2020-05-20 MED ORDER — SODIUM CHLORIDE 0.9% FLUSH: 3.0000 mL | Freq: Two times a day (BID) | INTRAVENOUS | Status: DC

## 2020-05-20 MED ORDER — SODIUM CHLORIDE 0.9 % IV SOLN: 250.0000 mL | INTRAVENOUS | Status: DC | PRN

## 2020-05-20 NOTE — Progress Notes (Signed)
ANTICOAGULATION CONSULT NOTE   Pharmacy Consult for:  heparin dosing Indication: ACS/STEMI  Allergies  Allergen Reactions  . Contrast Media [Iodinated Diagnostic Agents]     Unknown reaction  . Morphine And Related   . Other     Pt has multiple allergies per pt but unsure of all.   . Penicillins     Unknown reaction  . Codeine Rash    Patient Measurements: Height: 4\' 11"  (149.9 cm) Weight: 69.9 kg (154 lb 1.6 oz) IBW/kg (Calculated) : 43.2 Heparin Dosing Weight:  HEPARIN DW (KG): 59 HEPARIN DW (KG): 59  Vital Signs: Temp: 99.1 F (37.3 C) (06/06 0832) Temp Source: Oral (06/06 0832) BP: 121/75 (06/06 0832) Pulse Rate: 74 (06/06 0832)  Labs: Recent Labs     0000 05/17/20 1538 05/17/20 1722 05/17/20 1951 05/18/20 1255 05/18/20 1452 05/18/20 2102 05/19/20 0700 05/19/20 1153 05/20/20 0229  HGB   < > 11.3*  --   --   --   --   --  11.3*  --  11.9*  HCT  --  34.2*  --   --   --   --   --  33.9*  --  35.7*  PLT  --  264  --   --   --   --   --  257  --  268  HEPARINUNFRC  --   --   --   --   --   --    < > 0.40 0.45 0.65  CREATININE  --  1.40*  --   --   --   --   --   --   --  1.26*  TROPONINIHS  --  9   < > 13 9 14   --   --   --   --    < > = values in this interval not displayed.   Estimated Creatinine Clearance: 31.3 mL/min (A) (by C-G formula based on SCr of 1.26 mg/dL (H)).    Assessment: 79 y.o. female presenting from [facility] to [facility] for LHC due to atypical chest pain. NO AC PTA. Pharmacy consulted to dose heparin.  Heparin level therapeutic at 0.65. CBC stable and no reports of bleeding of infusion issues per RN. Planning for [facility] Monday 6/7.   Goal of Therapy:  Heparin level 0.3-0.7 units/ml Monitor platelets by anticoagulation protocol: Yes   Plan:  Continue heparin drip @ 950 units/hr Daily HL and CBC Planned cath for Monday 6/7  Monitor for s/sx of bleeding  Thursday, PharmD PGY1 Ambulatory Care Resident [employer] # [phone]

## 2020-05-20 NOTE — Progress Notes (Signed)
Progress Note  Patient Name: [name] [name] Date of Encounter: 05/20/2020  Primary Cardiologist: Dina Rich, MD   Subjective   No chest pain  Inpatient Medications    Scheduled Meds: . [START ON 05/21/2020] aspirin  81 mg Oral Pre-Cath  . aspirin EC  81 mg Oral QPM  . atorvastatin  40 mg Oral Daily  . [START ON 05/21/2020] diphenhydrAMINE  50 mg Oral Once   Or  . [START ON 05/21/2020] diphenhydrAMINE  50 mg Intravenous Once  . doxycycline  100 mg Oral Q12H  . guaiFENesin  600 mg Oral BID  . insulin aspart  0-5 Units Subcutaneous QHS  . insulin aspart  0-9 Units Subcutaneous TID WC  . predniSONE  50 mg Oral Q6H  . sodium chloride flush  3 mL Intravenous Q12H  . sodium chloride flush  3 mL Intravenous Q12H   Continuous Infusions: . sodium chloride    . [START ON 05/21/2020] sodium chloride     Followed by  . [START ON 05/21/2020] sodium chloride    . cefTRIAXone (ROCEPHIN)  IV 1 g (05/19/20 1805)  . heparin 950 Units/hr (05/19/20 1910)   PRN Meds: sodium chloride, acetaminophen **OR** acetaminophen, nitroGLYCERIN, ondansetron **OR** ondansetron (ZOFRAN) IV, polyethylene glycol, sodium chloride flush   Vital Signs    Vitals:   05/19/20 2000 05/20/20 0036 05/20/20 0506 05/20/20 0832  BP: (!) 129/59 (!) 130/57 123/64 121/75  Pulse: 63 61 68 74  Resp: 20 18 15 19   Temp: 98.1 F (36.7 C) 98.5 F (36.9 C) 98.1 F (36.7 C) 99.1 F (37.3 C)  TempSrc: Oral Oral Axillary Oral  SpO2: 98% 96% 95% 97%  Weight:   69.9 kg   Height:        Intake/Output Summary (Last 24 hours) at 05/20/2020 1104 Last data filed at 05/20/2020 0948 Gross per 24 hour  Intake 118 ml  Output 550 ml  Net -432 ml   Filed Weights   05/18/20 1217 05/19/20 1601 05/20/20 0506  Weight: 71 kg 70.8 kg 69.9 kg    Telemetry    nsr - Personally Reviewed  ECG    none - Personally Reviewed  Physical Exam   GEN: No acute distress.   Neck: No JVD Cardiac: RRR, no murmurs, rubs, or gallops.    Respiratory: Clear to auscultation bilaterally. GI: Soft, nontender, non-distended large ventral hernia MS: No edema; No deformity. Neuro:  Nonfocal  Psych: Normal affect   Labs    Chemistry Recent Labs  Lab 05/17/20 1538 05/17/20 1539 05/20/20 0229  NA 137  --  138  K 4.4  --  4.8  CL 105  --  105  CO2 22  --  22  GLUCOSE 121*  --  157*  BUN 25*  --  23  CREATININE 1.40*  --  1.26*  CALCIUM 9.1  --  8.9  PROT  --  7.0  --   ALBUMIN  --  3.8  --   AST  --  17  --   ALT  --  18  --   ALKPHOS  --  48  --   BILITOT  --  0.4  --   GFRNONAA 36*  --  41*  GFRAA 42*  --  47*  ANIONGAP 10  --  11     Hematology Recent Labs  Lab 05/17/20 1538 05/19/20 0700 05/20/20 0229  WBC 8.4 9.2 8.8  RBC 3.58* 3.59* 3.79*  HGB 11.3* 11.3* 11.9*  HCT  34.2* 33.9* 35.7*  MCV 95.5 94.4 94.2  MCH 31.6 31.5 31.4  MCHC 33.0 33.3 33.3  RDW 12.1 12.2 12.1  PLT 264 257 268    Cardiac EnzymesNo results for input(s): TROPONINI in the last 168 hours. No results for input(s): TROPIPOC in the last 168 hours.   BNPNo results for input(s): BNP, PROBNP in the last 168 hours.   DDimer No results for input(s): DDIMER in the last 168 hours.   Radiology    NM Myocar Multi W/Spect W/Wall Motion / EF  Result Date: 05/18/2020  Mild progression of baseline inferior and lateral ST/T changes after regadenoson  Chest pain after regadenoson, mild improvement with sublignual nitroglycerin x 2  Findings consistent with large inferior/inferoapical/inferolateral prior myocardial infarction with high degree of peri-infarct ischemia. Large inferoseptal/septal infarct with high degree of peri-infarct ischemia. Overall SSS 25, SRS 6, SDS 19  This is a high risk study.  The left ventricular ejection fraction is normal (55-65%).     Cardiac Studies   none  Patient Profile     79 y.o. female admitted in transfer from [place] with [place]  Assessment & Plan    1. [place] - she is pending left heart cath tomorrow.  Additional recs will follow the results of her heart cath 2. Chronic stage 3 renal insuff. - her creatinine is stable at 1.2 down from 1.4.  3. Dyslipidemia - she will continue her statin.    For questions or updates, please contact [place] Please consult [email or web address] for contact info under Cardiology/STEMI.   Signed, Cristopher Peru, MD  05/20/2020, 11:04 AM  Patient ID: [name] [name], female   DOB: October 21, 1941, 79 y.o.   MRN: [id number]

## 2020-05-20 NOTE — Progress Notes (Signed)
PROGRESS NOTE  [name] [name] PYK:[id number] DOB: 1941-02-28 DOA: 05/17/2020 PCP: Wandra Feinstein, MD  Brief History   The patient is a 79 yr old man woman who presented to [facility] ED with complaints of chest pain on 05/17/2020. She has a past medical history of HTN, HLD, Stage 3 bCKD(s/p L nephrectomy in 1970's complicated by abdominal wall hernia)and Type 2 DM admitted on 05/17/2020 with atypical chest pain, stress test is high risk. The patient was transferred to [facility] on 05/19/2020. She has been evaluated by cardiology and is planned to undergo LHC on 05/21/2020. She has a history of allergy to iodinated contrast dye. She in on a prophylaxis protocol.  At [facility] a CXR was obtained that demonstrated a lebt base opacity reprsenting either atelectasis or infiltrate. Procalcitonin is negative, so I will stop antibiotics.  She denies any pain since the stress test.  Consultants  . Cardiology  Procedures  . Nuclear Medicine Stress test  Antibiotics   Anti-infectives (From admission, onward)   Start     Dose/Rate Route Frequency Ordered Stop   05/18/20 2200  doxycycline (VIBRA-TABS) tablet 100 mg     100 mg Oral Every 12 hours 05/18/20 1620     05/18/20 1700  cefTRIAXone (ROCEPHIN) 1 g in sodium chloride 0.9 % 100 mL IVPB     1 g 200 mL/hr over 30 Minutes Intravenous Every 24 hours 05/18/20 1620      .  Subjective  The patient is resting comfortably. No new complaints.  Objective   Vitals:  Vitals:   05/20/20 1245 05/20/20 1628  BP: 127/62 (!) 129/53  Pulse: 64 67  Resp: 16 17  Temp:  98.4 F (36.9 C)  SpO2: 99% 99%   Exam:  Constitutional:  . The patient is awake, alert, and oriented x 3. No acute distress. Respiratory:  . No increased work of breathing. . No wheezes, rales, or rhonchi . No tactile fremitus Cardiovascular:  . Regular rate and rhythm . No murmurs, ectopy, or gallups. . No lateral PMI. No thrills. Abdomen:  . Abdomen is soft, non-tender,  non-distended . No hernias, masses, or organomegaly . Normoactive bowel sounds.  Musculoskeletal:  . No cyanosis, clubbing, or edema Skin:  . No rashes, lesions, ulcers . palpation of skin: no induration or nodules Neurologic:  . CN 2-12 intact . Sensation all 4 extremities intact Psychiatric:  . Mental status o Mood, affect appropriate o Orientation to person, place, time  . judgment and insight appear intact   I have personally reviewed the following:   Today's Data  . Vitals, BMP, CBC  Scheduled Meds: . [START ON 05/21/2020] aspirin  81 mg Oral Pre-Cath  . aspirin EC  81 mg Oral QPM  . atorvastatin  40 mg Oral Daily  . [START ON 05/21/2020] diphenhydrAMINE  50 mg Oral Once   Or  . [START ON 05/21/2020] diphenhydrAMINE  50 mg Intravenous Once  . doxycycline  100 mg Oral Q12H  . guaiFENesin  600 mg Oral BID  . insulin aspart  0-5 Units Subcutaneous QHS  . insulin aspart  0-9 Units Subcutaneous TID WC  . predniSONE  50 mg Oral Q6H  . sodium chloride flush  3 mL Intravenous Q12H   Continuous Infusions: . sodium chloride    . [START ON 05/21/2020] sodium chloride     Followed by  . [START ON 05/21/2020] sodium chloride    . cefTRIAXone (ROCEPHIN)  IV 1 g (05/19/20 1805)  . heparin  950 Units/hr (05/19/20 1910)    Principal Problem:   Abnormal cardiovascular stress test Active Problems:   Diabetes mellitus (HCC)   Chest pain   Ventral hernia without obstruction or gangrene   Chronic obstructive pulmonary disease, unspecified (HCC)   Major depressive disorder, single episode, unspecified   LOS: 2 days   A & P   Chest Pain with atypical features: Positive stress test. Cardiology has been consulted. The plan is for LHC in the am. The patient has an allergy to iodinated contrast dye. She is getting prednisone 50 mg bid as part of a prophylactic protocol. She is also on a heparin gtt. She has been continued on ASA and lipitor. Echocardiogram has been performed. It demonstrated  an EF of 55-60% without wall motion abnormalities. She has been transferred to [facility] for further risk stratification and possible intervention by cardiology.  AKI on CKD stage 3b/Single kidney:  Creatinine on admission=1.40  , baseline creatinine =  1.3 (02/2020). Current creatinine is 1.26. Will monitor creatinine, electrolytes, and volume status. Will avoid nephrotoxic substances and hypotension. The patient has only a single kidney following left nephrectomy in 1967.  DM2: A1c 6.8 reflecting excellent diabetic control PTA. The patient's metformin has been held while inpatient. Blood sugars will be monitored with FSBS and SSI.    Possible Lt sided PNA: Ruled out as procalcitonin is negative. Antibiotics will be discontinued.  Hyperlipidemia: -fasting lipid profile from March 2021 noted, LDL was 109 and HDL was 38. The patient has been continued on lipitor 40 mg daily.   COPD: Noted and stable. As needed albuterol neb treatments have been made available.  I have seen and examined this patient myself. I have spent 35 minutes in her evaluation and care.  Purva Vessell, DO [facility] Direct contact: see [email or web address]  7PM-7AM contact night coverage as above 05/20/2020, 5:30 PM  LOS: 2 days

## 2020-05-21 ENCOUNTER — Encounter (HOSPITAL_COMMUNITY)
Admission: EM | Disposition: A | Discharge status: Skilled Nursing Facility | Payer: Self-pay | Source: Home / Self Care | Attending: Thoracic Surgery (Cardiothoracic Vascular Surgery)

## 2020-05-21 ENCOUNTER — Inpatient Hospital Stay (HOSPITAL_COMMUNITY): Payer: Medicare Other

## 2020-05-21 DIAGNOSIS — I25110 Atherosclerotic heart disease of native coronary artery with unstable angina pectoris: Principal | ICD-10-CM

## 2020-05-21 DIAGNOSIS — Z01810 Encounter for preprocedural cardiovascular examination: Secondary | ICD-10-CM

## 2020-05-21 HISTORY — PX: LEFT HEART CATH AND CORONARY ANGIOGRAPHY: CATH118249

## 2020-05-21 LAB — ABO/RH: ABO/RH(D): A NEG

## 2020-05-21 LAB — APTT: aPTT: 26 seconds (ref 24–36)

## 2020-05-21 LAB — BASIC METABOLIC PANEL
Anion gap: 7 (ref 5–15)
Anion gap: 10 (ref 5–15)
BUN: 35 mg/dL — ABNORMAL HIGH (ref 8–23)
BUN: 33 mg/dL — ABNORMAL HIGH (ref 8–23)
CO2: 22 mmol/L (ref 22–32)
CO2: 20 mmol/L — ABNORMAL LOW (ref 22–32)
Calcium: 9.1 mg/dL (ref 8.9–10.3)
Calcium: 9.2 mg/dL (ref 8.9–10.3)
Chloride: 105 mmol/L (ref 98–111)
Chloride: 104 mmol/L (ref 98–111)
Creatinine, Ser: 1.64 mg/dL — ABNORMAL HIGH (ref 0.44–1.00)
Creatinine, Ser: 1.53 mg/dL — ABNORMAL HIGH (ref 0.44–1.00)
GFR calc Af Amer: 34 mL/min — ABNORMAL LOW (ref >60)
GFR calc Af Amer: 37 mL/min — ABNORMAL LOW (ref >60)
GFR calc non Af Amer: 30 mL/min — ABNORMAL LOW (ref >60)
GFR calc non Af Amer: 32 mL/min — ABNORMAL LOW (ref >60)
Glucose, Bld: 314 mg/dL — ABNORMAL HIGH (ref 70–99)
Glucose, Bld: 262 mg/dL — ABNORMAL HIGH (ref 70–99)
Potassium: 5.6 mmol/L — ABNORMAL HIGH (ref 3.5–5.1)
Potassium: 5.1 mmol/L (ref 3.5–5.1)
Sodium: 134 mmol/L — ABNORMAL LOW (ref 135–145)
Sodium: 134 mmol/L — ABNORMAL LOW (ref 135–145)

## 2020-05-21 LAB — CBC
HCT: 34 % — ABNORMAL LOW (ref 36.0–46.0)
Hemoglobin: 11.4 g/dL — ABNORMAL LOW (ref 12.0–15.0)
MCH: 31.1 pg (ref 26.0–34.0)
MCHC: 33.5 g/dL (ref 30.0–36.0)
MCV: 92.9 fL (ref 80.0–100.0)
Platelets: 262 10*3/uL (ref 150–400)
RBC: 3.66 MIL/uL — ABNORMAL LOW (ref 3.87–5.11)
RDW: 12 % (ref 11.5–15.5)
WBC: 8.7 10*3/uL (ref 4.0–10.5)
nRBC: 0 % (ref 0.0–0.2)

## 2020-05-21 LAB — HEMOGLOBIN A1C
Hgb A1c MFr Bld: 7.4 % — ABNORMAL HIGH (ref 4.8–5.6)
Mean Plasma Glucose: 165.68 mg/dL

## 2020-05-21 LAB — PREPARE RBC (CROSSMATCH)

## 2020-05-21 LAB — GLUCOSE, CAPILLARY
Glucose-Capillary: 247 mg/dL — ABNORMAL HIGH (ref 70–99)
Glucose-Capillary: 246 mg/dL — ABNORMAL HIGH (ref 70–99)
Glucose-Capillary: 292 mg/dL — ABNORMAL HIGH (ref 70–99)

## 2020-05-21 LAB — PROTIME-INR
INR: 1 (ref 0.8–1.2)
Prothrombin Time: 13.2 seconds (ref 11.4–15.2)

## 2020-05-21 LAB — SURGICAL PCR SCREEN
MRSA, PCR: NEGATIVE
Staphylococcus aureus: NEGATIVE

## 2020-05-21 LAB — HEPARIN LEVEL (UNFRACTIONATED): Heparin Unfractionated: 0.89 IU/mL — ABNORMAL HIGH (ref 0.30–0.70)

## 2020-05-21 LAB — PROCALCITONIN: Procalcitonin: <0.1 ng/mL

## 2020-05-21 SURGERY — LEFT HEART CATH AND CORONARY ANGIOGRAPHY
Anesthesia: LOCAL

## 2020-05-21 MED ORDER — EPINEPHRINE HCL 5 MG/250ML IV SOLN IN NS
0.0000 ug/min | INTRAVENOUS | Status: DC
  Filled 2020-05-21: qty 250

## 2020-05-21 MED ORDER — HEPARIN (PORCINE) 25000 UT/250ML-% IV SOLN
800.0000 [IU]/h | INTRAVENOUS | Status: DC
  Administered 2020-05-21: 800 [IU]/h via INTRAVENOUS

## 2020-05-21 MED ORDER — PHENYLEPHRINE HCL-NACL 20-0.9 MG/250ML-% IV SOLN
30.0000 ug/min | INTRAVENOUS | Status: DC
  Filled 2020-05-21: qty 250

## 2020-05-21 MED ORDER — PLASMA-LYTE 148 IV SOLN
INTRAVENOUS | Status: DC
  Filled 2020-05-21: qty 2.5

## 2020-05-21 MED ORDER — HEPARIN (PORCINE) IN NACL 1000-0.9 UT/500ML-% IV SOLN
INTRAVENOUS | Status: AC
  Filled 2020-05-21: qty 1000

## 2020-05-21 MED ORDER — CHLORHEXIDINE GLUCONATE CLOTH 2 % EX PADS
6.0000 | MEDICATED_PAD | Freq: Once | CUTANEOUS | Status: AC
  Administered 2020-05-22: 6 via TOPICAL

## 2020-05-21 MED ORDER — SODIUM CHLORIDE 0.9 % IV SOLN
750.0000 mg | INTRAVENOUS | Status: DC
  Filled 2020-05-21: qty 750

## 2020-05-21 MED ORDER — DEXMEDETOMIDINE HCL IN NACL 400 MCG/100ML IV SOLN
0.1000 ug/kg/h | INTRAVENOUS | Status: AC
  Administered 2020-05-22: .4 ug/kg/h via INTRAVENOUS
  Filled 2020-05-21: qty 100

## 2020-05-21 MED ORDER — VANCOMYCIN HCL 1250 MG/250ML IV SOLN
1250.0000 mg | INTRAVENOUS | Status: AC
  Administered 2020-05-22: 1250 mg via INTRAVENOUS
  Filled 2020-05-21: qty 250

## 2020-05-21 MED ORDER — SODIUM CHLORIDE 0.9% FLUSH: 3.0000 mL | Freq: Two times a day (BID) | INTRAVENOUS | Status: DC

## 2020-05-21 MED ORDER — METOPROLOL SUCCINATE ER 25 MG PO TB24: 25.0000 mg | ORAL_TABLET | Freq: Every day | ORAL | Status: DC

## 2020-05-21 MED ORDER — SODIUM CHLORIDE 0.9 % IV SOLN: 250.0000 mL | INTRAVENOUS | Status: DC | PRN

## 2020-05-21 MED ORDER — HEPARIN (PORCINE) IN NACL 1000-0.9 UT/500ML-% IV SOLN
INTRAVENOUS | Status: DC | PRN
  Administered 2020-05-21 (×2): 500 mL

## 2020-05-21 MED ORDER — MANNITOL 20 % IV SOLN
Freq: Once | INTRAVENOUS | Status: DC
  Filled 2020-05-21: qty 13

## 2020-05-21 MED ORDER — POTASSIUM CHLORIDE 2 MEQ/ML IV SOLN
80.0000 meq | INTRAVENOUS | Status: DC
  Filled 2020-05-21: qty 40

## 2020-05-21 MED ORDER — INSULIN REGULAR(HUMAN) IN NACL 100-0.9 UT/100ML-% IV SOLN
INTRAVENOUS | Status: AC
  Administered 2020-05-22: 5 [IU]/h via INTRAVENOUS
  Filled 2020-05-21: qty 100

## 2020-05-21 MED ORDER — CHLORHEXIDINE GLUCONATE 0.12 % MT SOLN
15.0000 mL | Freq: Once | OROMUCOSAL | Status: AC
  Administered 2020-05-22: 15 mL via OROMUCOSAL
  Filled 2020-05-21: qty 15

## 2020-05-21 MED ORDER — LIDOCAINE HCL (PF) 1 % IJ SOLN
INTRAMUSCULAR | Status: AC
  Filled 2020-05-21: qty 30

## 2020-05-21 MED ORDER — VERAPAMIL HCL 2.5 MG/ML IV SOLN
INTRAVENOUS | Status: AC
  Filled 2020-05-21: qty 2

## 2020-05-21 MED ORDER — IOHEXOL 350 MG/ML SOLN
INTRAVENOUS | Status: DC | PRN
  Administered 2020-05-21: 50 mL via INTRA_ARTERIAL

## 2020-05-21 MED ORDER — TEMAZEPAM 15 MG PO CAPS: 15.0000 mg | ORAL_CAPSULE | Freq: Once | ORAL | Status: DC | PRN

## 2020-05-21 MED ORDER — BISACODYL 5 MG PO TBEC
5.0000 mg | DELAYED_RELEASE_TABLET | Freq: Once | ORAL | Status: AC
  Administered 2020-05-21: 5 mg via ORAL
  Filled 2020-05-21: qty 1

## 2020-05-21 MED ORDER — NOREPINEPHRINE 4 MG/250ML-% IV SOLN
0.0000 ug/min | INTRAVENOUS | Status: DC
  Filled 2020-05-21: qty 250

## 2020-05-21 MED ORDER — NITROGLYCERIN IN D5W 200-5 MCG/ML-% IV SOLN
2.0000 ug/min | INTRAVENOUS | Status: DC
  Filled 2020-05-21: qty 250

## 2020-05-21 MED ORDER — SODIUM CHLORIDE 0.9 % IV SOLN
1.5000 g | INTRAVENOUS | Status: AC
  Administered 2020-05-22: 1.5 g via INTRAVENOUS
  Administered 2020-05-22: .75 g via INTRAVENOUS
  Filled 2020-05-21: qty 1.5

## 2020-05-21 MED ORDER — CHLORHEXIDINE GLUCONATE CLOTH 2 % EX PADS
6.0000 | MEDICATED_PAD | Freq: Once | CUTANEOUS | Status: AC
  Administered 2020-05-21: 6 via TOPICAL

## 2020-05-21 MED ORDER — SODIUM CHLORIDE 0.9 % WEIGHT BASED INFUSION: 1.0000 mL/kg/h | INTRAVENOUS | Status: AC

## 2020-05-21 MED ORDER — SODIUM CHLORIDE 0.9 % IV SOLN
INTRAVENOUS | Status: DC
  Filled 2020-05-21: qty 30

## 2020-05-21 MED ORDER — MILRINONE LACTATE IN DEXTROSE 20-5 MG/100ML-% IV SOLN
0.3000 ug/kg/min | INTRAVENOUS | Status: DC
  Filled 2020-05-21: qty 100

## 2020-05-21 MED ORDER — TRANEXAMIC ACID (OHS) BOLUS VIA INFUSION
15.0000 mg/kg | INTRAVENOUS | Status: AC
  Administered 2020-05-22: 1062 mg via INTRAVENOUS
  Filled 2020-05-21: qty 1062

## 2020-05-21 MED ORDER — TRANEXAMIC ACID (OHS) PUMP PRIME SOLUTION
2.0000 mg/kg | INTRAVENOUS | Status: DC
  Filled 2020-05-21: qty 1.42

## 2020-05-21 MED ORDER — VERAPAMIL HCL 2.5 MG/ML IV SOLN
INTRAVENOUS | Status: DC | PRN
  Administered 2020-05-21: 10 mL via INTRA_ARTERIAL

## 2020-05-21 MED ORDER — IOHEXOL 350 MG/ML SOLN
INTRAVENOUS | Status: AC
  Filled 2020-05-21: qty 1

## 2020-05-21 MED ORDER — TRANEXAMIC ACID 1000 MG/10ML IV SOLN
1.5000 mg/kg/h | INTRAVENOUS | Status: AC
  Administered 2020-05-22: 1.5 mg/kg/h via INTRAVENOUS
  Filled 2020-05-21: qty 25

## 2020-05-21 MED ORDER — SODIUM CHLORIDE 0.9% FLUSH: 3.0000 mL | INTRAVENOUS | Status: DC | PRN

## 2020-05-21 MED ORDER — LIDOCAINE HCL (PF) 1 % IJ SOLN
INTRAMUSCULAR | Status: DC | PRN
  Administered 2020-05-21: 2 mL via INTRADERMAL

## 2020-05-21 MED ORDER — HEPARIN SODIUM (PORCINE) 1000 UNIT/ML IJ SOLN
INTRAMUSCULAR | Status: DC | PRN
  Administered 2020-05-21: 3500 [IU] via INTRAVENOUS

## 2020-05-21 MED ORDER — METOPROLOL TARTRATE 12.5 MG HALF TABLET
12.5000 mg | ORAL_TABLET | Freq: Once | ORAL | Status: AC
  Administered 2020-05-22: 12.5 mg via ORAL
  Filled 2020-05-21: qty 1

## 2020-05-21 SURGICAL SUPPLY — 10 items
CATH 5FR JL3.5 JR4 ANG PIG MP (CATHETERS) ×1 IMPLANT
DEVICE RAD COMP TR BAND LRG (VASCULAR PRODUCTS) ×1 IMPLANT
GLIDESHEATH SLEND SS 6F .021 (SHEATH) ×1 IMPLANT
GUIDEWIRE INQWIRE 1.5J.035X260 (WIRE) IMPLANT
INQWIRE 1.5J .035X260CM (WIRE) ×2
KIT HEART LEFT (KITS) ×2 IMPLANT
PACK CARDIAC CATHETERIZATION (CUSTOM PROCEDURE TRAY) ×2 IMPLANT
SHEATH PROBE COVER 6X72 (BAG) ×1 IMPLANT
TRANSDUCER W/STOPCOCK (MISCELLANEOUS) ×2 IMPLANT
TUBING CIL FLEX 10 FLL-RA (TUBING) ×2 IMPLANT

## 2020-05-21 NOTE — Consult Note (Signed)
[address]       [place] [address]             [phone]        Joannie H Meriweather Monson Medical Record #[id number] Date of Birth: Feb 27, 1941  Referring: No ref. provider found Primary Care: Corum, Rex Kras, MD Primary Cardiologist:Branch, Roderic Palau, MD  Chief Complaint:    Chief Complaint  Patient presents with   Chest Pain    History of Present Illness:     The patient is a 79 yr old man woman who presented to [facility] ED with complaints of chest pain on 05/17/2020. She has a past medical history of HTN, HLD, Stage 3bCKD(s/p L nephrectomy in 6962'X complicated by abdominal wall hernia)and Type 2 DMadmitted on 05/17/2020 with atypical chest pain, stress test is high risk. The patient was transferred to [facility] on 05/19/2020.    LHC was performed this morning that demonstrates critical 3 vessel disease including 95% occlusion of proximal LAD, 100% Lt Circumflex , M1 & M2 with collaterals, and Mid-RCA with collaterals. TCTS has been consulted to assist with management.  She is currently chest pain free.   Past Medical and Surgical History: Previous Chest Surgery: no Previous Chest Radiation: no Diabetes Mellitus: yes.  HbA1C 6.8 Creatinine: 1.53  Past Medical History:  Diagnosis Date   Anemia, unspecified    Anxiety disorder, unspecified    Atherosclerotic heart disease of native coronary artery without angina pectoris    Chronic obstructive pulmonary disease, unspecified (HCC)    Diabetes mellitus    Gastro-esophageal reflux disease with esophagitis    Heart disease    Hypertension    Hypertensive chronic kidney disease with stage 1 through stage 4 chronic kidney disease, or unspecified chronic kidney disease    Hypertensive heart disease without heart failure    Major depressive disorder, single episode, unspecified    Pneumonia    Renal disorder    Renal insufficiency    Shortness of breath    Type 2 diabetes mellitus with  diabetic nephropathy (HCC)    Type 2 diabetes mellitus with hyperglycemia ([place])    Unspecified abdominal hernia without obstruction or gangrene    Unspecified osteoarthritis, unspecified site     Past Surgical History:  Procedure Laterality Date   LEFT HEART CATH AND CORONARY ANGIOGRAPHY N/A 05/21/2020   Procedure: LEFT HEART CATH AND CORONARY ANGIOGRAPHY;  Surgeon: Martinique, Peter M, MD;  Location: [place] [facility];  Service: Cardiovascular;  Laterality: N/A;   NEPHRECTOMY     VENTRAL HERNIA REPAIR  09/01/2017   mistaken entry    Social History: Support: lives alone  Social History   Tobacco Use  Smoking Status Never Smoker  Smokeless Tobacco Never Used    Social History   Substance and Sexual Activity  Alcohol Use No     Allergies  Allergen Reactions   Contrast Media [Iodinated Diagnostic Agents]     Unknown reaction   Morphine And Related    Other     Pt has multiple allergies per pt but unsure of all.    Penicillins     Unknown reaction   Codeine Rash      Current Facility-Administered Medications  Medication Dose Route Frequency Provider Last Rate Last Admin   0.9 %  sodium chloride infusion  250 mL Intravenous PRN Martinique, Peter M, MD       0.9% sodium chloride infusion  1 mL/kg/hr Intravenous Continuous [place], Peter  M, MD       acetaminophen (TYLENOL) tablet 650 mg  650 mg Oral Q6H PRN [place], Peter M, MD   650 mg at 05/20/20 0630   Or   acetaminophen (TYLENOL) suppository 650 mg  650 mg Rectal Q6H PRN [place], Peter M, MD       aspirin EC tablet 81 mg  81 mg Oral QPM [place], Peter M, MD   81 mg at 05/20/20 1741   atorvastatin (LIPITOR) tablet 40 mg  40 mg Oral Daily [place], Peter M, MD   40 mg at 05/21/20 0949   [START ON 05/22/2020] cefUROXime (ZINACEF) 1.5 g in sodium chloride 0.9 % 100 mL IVPB  1.5 g Intravenous To OR Corliss Skains, MD       [START ON 05/22/2020] cefUROXime (ZINACEF) 750 mg in sodium chloride 0.9 % 100 mL IVPB  750  mg Intravenous To OR Shakenna Herrero, Eliezer Lofts, MD       [START ON 05/22/2020] dexmedetomidine (PRECEDEX) 400 MCG/100ML (4 mcg/mL) infusion  0.1-0.7 mcg/kg/hr Intravenous To OR Corliss Skains, MD       [START ON 05/22/2020] EPINEPHrine (ADRENALIN) 4 mg in NS 250 mL (0.016 mg/mL) premix infusion  0-10 mcg/min Intravenous To OR Heriberto Stmartin, Eliezer Lofts, MD       guaiFENesin (MUCINEX) 12 hr tablet 600 mg  600 mg Oral BID [place], Peter M, MD   600 mg at 05/21/20 0949   [START ON 05/22/2020] heparin 30,000 units/NS 1000 mL solution for CELLSAVER   Other To OR Raudel Bazen, Eliezer Lofts, MD       heparin ADULT infusion 100 units/mL (25000 units/226mL sodium chloride 0.45%)  800 Units/hr Intravenous Continuous Mosetta Anis, [facility]       [START ON 05/22/2020] heparin sodium (porcine) 2,500 Units, papaverine 30 mg in electrolyte-148 (PLASMALYTE-148) 500 mL irrigation   Irrigation To OR Micheale Schlack O, MD       insulin aspart (novoLOG) injection 0-5 Units  0-5 Units Subcutaneous QHS [place], Peter M, MD   5 Units at 05/20/20 2230   insulin aspart (novoLOG) injection 0-9 Units  0-9 Units Subcutaneous TID WC [place], Peter M, MD   3 Units at 05/21/20 0947   [START ON 05/22/2020] insulin regular, human (MYXREDLIN) 100 units/ 100 mL infusion   Intravenous To OR Corliss Skains, MD       [START ON 05/22/2020] [place] Blood Cardioplegia vial (lidocaine/magnesium/mannitol 0.26g-4g-6.4g)   Intracoronary Once Corliss Skains, MD       metoprolol succinate (TOPROL-XL) 24 hr tablet 25 mg  25 mg Oral Daily [place], Peter M, MD       [START ON 05/22/2020] milrinone (PRIMACOR) 20 MG/100 ML (0.2 mg/mL) infusion  0.3 mcg/kg/min Intravenous To OR [place] Canty, Eliezer Lofts, MD       nitroGLYCERIN (NITROSTAT) SL tablet 0.4 mg  0.4 mg Sublingual Q5 min PRN [place], Peter M, MD   0.4 mg at 05/17/20 1550   [START ON 05/22/2020] nitroGLYCERIN 50 mg in dextrose 5 % 250 mL (0.2 mg/mL) infusion  2-200 mcg/min Intravenous To OR  Corliss Skains, MD       [START ON 05/22/2020] norepinephrine (LEVOPHED) 4mg  in premix infusion  0-40 mcg/min Intravenous To OR Kaysie Michelini, , MD       ondansetron (ZOFRAN) tablet 4 mg  4 mg Oral Q6H PRN Eliezer Lofts, Peter M, MD       Or   ondansetron [employer]) injection 4 mg  4 mg Intravenous Q6H PRN [facility], Peter  M, MD       [START ON 05/22/2020] phenylephrine (NEOSYNEPHRINE) 20-0.9 MG/250ML-% infusion  30-200 mcg/min Intravenous To OR Enslie Sahota O, MD       polyethylene glycol (MIRALAX / GLYCOLAX) packet 17 g  17 g Oral Daily PRN [place], Peter M, MD   17 g at 05/18/20 1924   [START ON 05/22/2020] potassium chloride injection 80 mEq  80 mEq Other To OR Britany Callicott, Eliezer Lofts, MD       sodium chloride flush (NS) 0.9 % injection 3 mL  3 mL Intravenous Q12H [place], Peter M, MD   3 mL at 05/21/20 0946   sodium chloride flush (NS) 0.9 % injection 3 mL  3 mL Intravenous Q12H [place], Peter M, MD       sodium chloride flush (NS) 0.9 % injection 3 mL  3 mL Intravenous PRN [place], Peter M, MD       [START ON 05/22/2020] tranexamic acid (CYKLOKAPRON) 2,500 mg in sodium chloride 0.9 % 250 mL (10 mg/mL) infusion  1.5 mg/kg/hr Intravenous To OR Corliss Skains, MD       [START ON 05/22/2020] tranexamic acid (CYKLOKAPRON) bolus via infusion - over 30 minutes 1,062 mg  15 mg/kg Intravenous To OR Corliss Skains, MD       [START ON 05/22/2020] tranexamic acid (CYKLOKAPRON) pump prime solution 142 mg  2 mg/kg Intracatheter To OR Corliss Skains, MD       [START ON 05/22/2020] vancomycin (VANCOREADY) IVPB 1250 mg/250 mL  1,250 mg Intravenous To OR Jakeya Gherardi, Eliezer Lofts, MD        Medications Prior to Admission  Medication Sig Dispense Refill Last Dose   aspirin 81 MG tablet Take 81 mg by mouth every evening.    05/16/2020 at Unknown time   Biotin w/ Vitamins C & E (HAIR SKIN & NAILS GUMMIES PO) Take 2 each by mouth every morning.   05/17/2020 at Unknown time   fish oil-omega-3  fatty acids 1000 MG capsule Take 1 g by mouth 2 (two) times daily.   05/17/2020 at Unknown time   JANUMET 50-1000 MG tablet Take 1 tablet by mouth 2 (two) times daily.   05/17/2020 at Unknown time   losartan (COZAAR) 50 MG tablet Take 25 mg by mouth every morning.    05/17/2020 at Unknown time   Multiple Vitamin (MULITIVITAMIN WITH MINERALS) TABS Take 1 tablet by mouth daily.   05/17/2020 at Unknown time   Multiple Vitamins-Minerals (AIRBORNE GUMMIES PO) Take 1 each by mouth every morning.   05/17/2020 at Unknown time    Family History  Problem Relation Age of Onset   CAD Father    Heart failure Sister      Review of Systems:   Review of Systems  Constitutional: Negative.   Respiratory: Positive for shortness of breath.   Cardiovascular: Positive for chest pain.  Gastrointestinal: Positive for abdominal pain.  Musculoskeletal: Positive for myalgias.      Physical Exam: BP (!) 115/96 (BP Location: Left Arm)    Pulse 86    Temp 97.8 F (36.6 C) (Oral)    Resp 18    Ht 4\' 11"  (1.499 m)    Wt 70.8 kg    SpO2 97%    BMI 31.53 kg/m  Physical Exam  Constitutional: She is oriented to person, place, and time. No distress.  HENT:  Head: Normocephalic and atraumatic.  Eyes: Conjunctivae are normal.  Neck: No tracheal deviation present.  Cardiovascular: Normal rate.  No murmur heard. Pulmonary/Chest: Effort normal and breath sounds normal. No respiratory distress.  Abdominal:  Large mostly left sided ventral hernia  Musculoskeletal:     Cervical back: Normal range of motion.  Neurological: She is alert and oriented to person, place, and time.  Skin: Skin is warm and dry. She is not diaphoretic.      Diagnostic Studies & Laboratory data:    Left Heart Catherization: Severe 3 V disease.  CTO RCA, and Circ.  Tight stenosis in proximal LAD.  Good collaterals, and good distal targets on all walls Echo: Preserved EF, no significant valvular disease   I have independently reviewed the  above radiologic studies and discussed with the patient   Recent Lab Findings: Lab Results  Component Value Date   WBC 8.7 05/21/2020   HGB 11.4 (L) 05/21/2020   HCT 34.0 (L) 05/21/2020   PLT 262 05/21/2020   GLUCOSE 262 (H) 05/21/2020   CHOL 159 05/18/2020   TRIG 286 (H) 05/18/2020   HDL 29 (L) 05/18/2020   LDLCALC 73 05/18/2020   ALT 18 05/17/2020   AST 17 05/17/2020   NA 134 (L) 05/21/2020   K 5.1 05/21/2020   CL 104 05/21/2020   CREATININE 1.53 (H) 05/21/2020   BUN 33 (H) 05/21/2020   CO2 20 (L) 05/21/2020   TSH 2.02 11/17/2019   HGBA1C 6.8 (H) 03/07/2020      Assessment / Plan:   79 yo female with severe 3V CAD, preserved EF, DM, and CRI. She is agreeable to proceed with CABG, and is scheduled for CABG 4 on 6/8.  Risks and benefits have been discussed. She will need cardiac rehab post-op given her living situation     I  spent 30 minutes counseling the patient face to face.   Corliss Skains 05/21/2020 4:39 PM

## 2020-05-21 NOTE — Progress Notes (Signed)
Patient was having 4/10 chest pain that she described as pressure. 12-lead EKG obtained and 1 SL nitro given. Deforest Hoyles, MD paged. Will continue to monitor.  Bari Edward, RN

## 2020-05-21 NOTE — Progress Notes (Signed)
Inpatient Diabetes Program Recommendations  AACE/ADA: New Consensus Statement on Inpatient Glycemic Control (2015)  Target Ranges:  Prepandial:   less than 140 mg/dL      Peak postprandial:   less than 180 mg/dL (1-2 hours)      Critically ill patients:  140 - 180 mg/dL   Lab Results  Component Value Date   GLUCAP 247 (H) 05/21/2020   HGBA1C 6.8 (H) 03/07/2020    Review of Glycemic Control Results for [name] [name], [name] [name] (MRN [id number]) as of 05/21/2020 09:59  Ref. Range 05/20/2020 11:32 05/20/2020 16:26 05/20/2020 22:03 05/21/2020 08:28  Glucose-Capillary Latest Ref Range: 70 - 99 mg/dL 979 (H) 480 (H) 165 (H) 247 (H)   Diabetes history: Type 2 DM Outpatient Diabetes medications: Janumet-50-1000 mg BID Current orders for Inpatient glycemic control: Novolog 0-9 units TID, Novolog 0-5 units QHS Prednisone 50 mg x 3  Inpatient Diabetes Program Recommendations:    Noted hyperglycemia following 3 doses of Prednisone. Patient NPO and well controlled prior to admission. Will continue to follow, anticipate trends to improve.   Thanks, Lujean Rave, MSN, RNC-OB Diabetes Coordinator [phone] (8a-5p)

## 2020-05-21 NOTE — Progress Notes (Signed)
PROGRESS NOTE  [name] [name]:[id number] DOB: 03/27/1941 DOA: 05/17/2020 PCP: Wandra Feinstein, MD  Brief History   The patient is a 79 yr old man woman who presented to [facility] ED with complaints of chest pain on 05/17/2020. She has a past medical history of HTN, HLD, Stage 3 bCKD(s/p L nephrectomy in 1970's complicated by abdominal wall hernia)and Type 2 DM admitted on 05/17/2020 with atypical chest pain, stress test is high risk. The patient was transferred to [facility] on 05/19/2020. She has been evaluated by cardiology and is planned to undergo LHC on 05/21/2020. She has a history of allergy to iodinated contrast dye. She in on a prophylaxis protocol.  At [facility] a CXR was obtained that demonstrated a lebt base opacity reprsenting either atelectasis or infiltrate. Procalcitonin is negative, so I will stop antibiotics.  She denies any pain since the stress test.  LHC was performed this morning that demonstrates critical 3 vessel disease including 95% occlusion of proximal LAD, 100% Lt Circumflex , M1 & M2 with collaterals, and Mid-RCA with collaterals. TCTS has been consulted to evaluate the patient for possible CABG.  Consultants   Cardiology  Procedures   Nuclear Medicine Stress test  LHC  Antibiotics   Anti-infectives (From admission, onward)   Start     Dose/Rate Route Frequency Ordered Stop   05/22/20 0400  vancomycin (VANCOREADY) IVPB 1250 mg/250 mL     1,250 mg 166.7 mL/hr over 90 Minutes Intravenous To Surgery 05/21/20 1427 05/23/20 0400   05/22/20 0400  cefUROXime (ZINACEF) 1.5 g in sodium chloride 0.9 % 100 mL IVPB     1.5 g 200 mL/hr over 30 Minutes Intravenous To Surgery 05/21/20 1427 05/23/20 0400   05/22/20 0400  cefUROXime (ZINACEF) 750 mg in sodium chloride 0.9 % 100 mL IVPB     750 mg 200 mL/hr over 30 Minutes Intravenous To Surgery 05/21/20 1427 05/23/20 0400   05/18/20 2200  doxycycline (VIBRA-TABS) tablet 100 mg  Status:  Discontinued     100 mg Oral Every 12  hours 05/18/20 1620 05/21/20 0802   05/18/20 1700  cefTRIAXone (ROCEPHIN) 1 g in sodium chloride 0.9 % 100 mL IVPB  Status:  Discontinued     1 g 200 mL/hr over 30 Minutes Intravenous Every 24 hours 05/18/20 1620 05/21/20 0802     Subjective  The patient is resting comfortably. No new complaints.  Objective   Vitals:  Vitals:   05/21/20 1249 05/21/20 1310  BP: 139/68 (!) 115/96  Pulse: 90 86  Resp: 19 18  Temp:  97.8 F (36.6 C)  SpO2: 98% 97%   Exam:  Constitutional:   The patient is awake, alert, and oriented x 3. No acute distress. Respiratory:   No increased work of breathing.  No wheezes, rales, or rhonchi  No tactile fremitus Cardiovascular:   Regular rate and rhythm  No murmurs, ectopy, or gallups.  No lateral PMI. No thrills. Abdomen:   Abdomen is soft, non-tender, non-distended  No hernias, masses, or organomegaly  Normoactive bowel sounds.  Musculoskeletal:   No cyanosis, clubbing, or edema Skin:   No rashes, lesions, ulcers  palpation of skin: no induration or nodules Neurologic:   CN 2-12 intact  Sensation all 4 extremities intact Psychiatric:   Mental status o Mood, affect appropriate o Orientation to person, place, time   judgment and insight appear intact   I have personally reviewed the following:   Today's Data   Vitals, BMP, CBC  Scheduled  Meds:  aspirin EC  81 mg Oral QPM   atorvastatin  40 mg Oral Daily   [START ON 05/22/2020] epinephrine  0-10 mcg/min Intravenous To OR   guaiFENesin  600 mg Oral BID   [START ON 05/22/2020] heparin-papaverine-plasmalyte irrigation   Irrigation To OR   insulin aspart  0-5 Units Subcutaneous QHS   insulin aspart  0-9 Units Subcutaneous TID WC   [START ON 05/22/2020] insulin   Intravenous To OR   [START ON 05/22/2020] [place] Blood Cardioplegia vial (lidocaine/magnesium/mannitol 0.26g-4g-6.4g)   Intracoronary Once   metoprolol succinate  25 mg Oral Daily   [START ON 05/22/2020]  phenylephrine  30-200 mcg/min Intravenous To OR   [START ON 05/22/2020] potassium chloride  80 mEq Other To OR   sodium chloride flush  3 mL Intravenous Q12H   sodium chloride flush  3 mL Intravenous Q12H   [START ON 05/22/2020] tranexamic acid  15 mg/kg Intravenous To OR   [START ON 05/22/2020] tranexamic acid  2 mg/kg Intracatheter To OR   Continuous Infusions:  sodium chloride     sodium chloride     [START ON 05/22/2020] cefUROXime (ZINACEF)  IV     [START ON 05/22/2020] cefUROXime (ZINACEF)  IV     [START ON 05/22/2020] dexmedetomidine     [START ON 05/22/2020] heparin 30,000 units/NS 1000 mL solution for CELLSAVER     heparin     [START ON 05/22/2020] milrinone     [START ON 05/22/2020] nitroGLYCERIN     [START ON 05/22/2020] norepinephrine     [START ON 05/22/2020] tranexamic acid (CYKLOKAPRON) infusion ([facility])     [START ON 05/22/2020] vancomycin      Principal Problem:   Abnormal cardiovascular stress test Active Problems:   Diabetes mellitus (HCC)   Chest pain   Ventral hernia without obstruction or gangrene   Chronic obstructive pulmonary disease, unspecified ([place])   Major depressive disorder, single episode, unspecified   LOS: 3 days   A & P   Chest Pain with atypical features: Positive stress test. Cardiology has been consulted. The plan is for LHC in the am. The patient has an allergy to iodinated contrast dye. She is getting prednisone 50 mg bid as part of a prophylactic protocol. She is also on a heparin gtt. She has been continued on ASA and lipitor. Echocardiogram has been performed. It demonstrated an EF of 55-60% without wall motion abnormalities. She has been transferred to [facility] for further risk stratification and possible intervention by cardiology.  AKI on CKD stage 3b/Single kidney:  Creatinine on admission=1.40  , baseline creatinine =  1.3 (02/2020). Current creatinine is 1.26. Will monitor creatinine, electrolytes, and volume status. Will avoid nephrotoxic  substances and hypotension. The patient has only a single kidney following left nephrectomy in 1967.  DM2: A1c 6.8 reflecting excellent diabetic control PTA. The patient's metformin has been held while inpatient. Blood sugars will be monitored with FSBS and SSI.    Possible Lt sided PNA: Ruled out as procalcitonin is negative. Antibiotics will be discontinued.  Hyperlipidemia: -fasting lipid profile from March 2021 noted, LDL was 109 and HDL was 38. The patient has been continued on lipitor 40 mg daily.   COPD: Noted and stable. As needed albuterol neb treatments have been made available.  I have seen and examined this patient myself. I have spent 35 minutes in her evaluation and care.  DVT Prophylaxis: Heparin drip CODE STATUS: Full Code Family Communication: None available Disposition: The patient is from home. Anticipate  discharge to home. Barriers to discharge: Critical 3 vessel CAD. Possible CABG.  Larence Thone, DO [facility] Direct contact: see [email or web address]  7PM-7AM contact night coverage as above 05/20/2020, 2:50 PM  LOS: 2 days

## 2020-05-21 NOTE — Interval H&P Note (Signed)
History and Physical Interval Note:  05/21/2020 11:54 AM  [name] [name]  has presented today for surgery, with the diagnosis of abnormal stress test.  The various methods of treatment have been discussed with the patient and family. After consideration of risks, benefits and other options for treatment, the patient has consented to  Procedure(s): LEFT HEART CATH AND CORONARY ANGIOGRAPHY (N/A) as a surgical intervention.  The patient's history has been reviewed, patient examined, no change in status, stable for surgery.  I have reviewed the patient's chart and labs.  Questions were answered to the patient's satisfaction.   Cath Lab Visit (complete for each Cath Lab visit)  Clinical Evaluation Leading to the Procedure:   ACS: Yes.    Non-ACS:    Anginal Classification: CCS IV  Anti-ischemic medical therapy: No Therapy  Non-Invasive Test Results: High-risk stress test findings: cardiac mortality >3%/year  Prior CABG: No previous CABG        Theron Arista [facility] 05/21/2020 11:54 AM

## 2020-05-21 NOTE — Progress Notes (Signed)
TCTS consulted for CABG evaluation. °

## 2020-05-21 NOTE — Progress Notes (Signed)
ANTICOAGULATION CONSULT NOTE   Pharmacy Consult for:  heparin dosing Indication: ACS/STEMI  Allergies  Allergen Reactions  . Contrast Media [Iodinated Diagnostic Agents]     Unknown reaction  . Morphine And Related   . Other     Pt has multiple allergies per pt but unsure of all.   . Penicillins     Unknown reaction  . Codeine Rash    Patient Measurements: Height: 4\' 11"  (149.9 cm) Weight: 70.7 kg (155 lb 12.8 oz) IBW/kg (Calculated) : 43.2 Heparin Dosing Weight:  HEPARIN DW (KG): 59 HEPARIN DW (KG): 59  Vital Signs: Temp: 98.2 F (36.8 C) (06/07 0104) Temp Source: Oral (06/07 0104) BP: 136/87 (06/07 0104) Pulse Rate: 82 (06/07 0104)  Labs: Recent Labs    05/18/20 1255 05/18/20 1452 05/18/20 2102 05/19/20 0700 05/19/20 0700 05/19/20 1153 05/20/20 0229 05/21/20 0436  HGB  --   --   --  11.3*   < >  --  11.9* 11.4*  HCT  --   --   --  33.9*  --   --  35.7* 34.0*  PLT  --   --   --  257  --   --  268 262  HEPARINUNFRC  --   --    < > 0.40   < > 0.45 0.65 0.89*  CREATININE  --   --   --   --   --   --  1.26*  --   TROPONINIHS 9 14  --   --   --   --   --   --    < > = values in this interval not displayed.   Estimated Creatinine Clearance: 31.5 mL/min (A) (by C-G formula based on SCr of 1.26 mg/dL (H)).    Assessment: 79 y.o. female presenting from [facility] to [place] for LHC due to atypical chest pain. NO AC PTA. Pharmacy consulted to dose heparin.  Heparin level up to supratherapeutic (0.89) on gtt at 950 units/hr. Small amount of blood when wiped rectum this morning - likely from hemorrhoid. Planning for cath today.   Goal of Therapy:  Heparin level 0.3-0.7 units/ml Monitor platelets by anticoagulation protocol: Yes   Plan:  Decrease heparin drip to 800 units/hr F/u post cath  70, PharmD, BCPS Please see amion for complete clinical pharmacist phone list 05/21/2020 5:31 AM

## 2020-05-21 NOTE — Progress Notes (Signed)
VASCULAR LAB PRELIMINARY  PRELIMINARY  PRELIMINARY  PRELIMINARY  Pre CABG Dopplers completed.    Preliminary report:  See CV proc for preliminary results.  Miquela Costabile, RVT 05/21/2020, 5:52 PM

## 2020-05-21 NOTE — Progress Notes (Addendum)
 Progress Note  Patient Name: [name] [name] Date of Encounter: 05/21/2020  [facility] Cardiologist: Branch, Jonathan, MD   Subjective   No chest pain and shortness of breath. Worried about her blood sugar.   Inpatient Medications    Scheduled Meds: . aspirin EC  81 mg Oral QPM  . atorvastatin  40 mg Oral Daily  . diphenhydrAMINE  50 mg Oral Once   Or  . diphenhydrAMINE  50 mg Intravenous Once  . guaiFENesin  600 mg Oral BID  . insulin aspart  0-5 Units Subcutaneous QHS  . insulin aspart  0-9 Units Subcutaneous TID WC  . sodium chloride flush  3 mL Intravenous Q12H   Continuous Infusions: . sodium chloride    . sodium chloride 1 mL/kg/hr (05/21/20 0610)  . heparin 800 Units/hr (05/21/20 0610)   PRN Meds: sodium chloride, acetaminophen **OR** acetaminophen, nitroGLYCERIN, ondansetron **OR** ondansetron (ZOFRAN) IV, polyethylene glycol, sodium chloride flush   Vital Signs    Vitals:   05/20/20 2054 05/21/20 0104 05/21/20 0641 05/21/20 0831  BP: (!) 138/53 136/87 (!) 153/71 (!) 132/98  Pulse: 75 82 76 83  Resp: 18 20 20 20  Temp: 98.3 F (36.8 C) 98.2 F (36.8 C) 97.6 F (36.4 C) 97.7 F (36.5 C)  TempSrc: Oral Oral Oral Oral  SpO2: 93% 96% 95% 96%  Weight:  70.7 kg 70.8 kg   Height:        Intake/Output Summary (Last 24 hours) at 05/21/2020 0859 Last data filed at 05/21/2020 0600 Gross per 24 hour  Intake 741 ml  Output 300 ml  Net 441 ml   Last 3 Weights 05/21/2020 05/21/2020 05/20/2020  Weight (lbs) 156 lb 1.6 oz 155 lb 12.8 oz 154 lb 1.6 oz  Weight (kg) 70.806 kg 70.67 kg 69.899 kg      Telemetry    NSR with PACS- Personally Reviewed  ECG  No new tracing   Physical Exam   GEN: No acute distress.   Neck: No JVD Cardiac: RRR, no murmurs, rubs, or gallops.  Respiratory: Clear to auscultation bilaterally. GI: Soft, nontender, non-distended  MS: No edema; No deformity. Neuro:  Nonfocal  Psych: Normal affect   Labs    High Sensitivity Troponin:    Recent Labs  Lab 05/17/20 1538 05/17/20 1722 05/17/20 1951 05/18/20 1255 05/18/20 1452  TROPONINIHS 9 12 13 9 14      Chemistry Recent Labs  Lab 05/17/20 1538 05/17/20 1539 05/20/20 0229 05/21/20 0436 05/21/20 0731  NA   < >  --  138 134* 134*  K   < >  --  4.8 5.6* 5.1  CL   < >  --  105 105 104  CO2   < >  --  22 22 20*  GLUCOSE   < >  --  157* 314* 262*  BUN   < >  --  23 35* 33*  CREATININE   < >  --  1.26* 1.64* 1.53*  CALCIUM   < >  --  8.9 9.1 9.2  PROT  --  7.0  --   --   --   ALBUMIN  --  3.8  --   --   --   AST  --  17  --   --   --   ALT  --  18  --   --   --   ALKPHOS  --  48  --   --   --     BILITOT  --  0.4  --   --   --   GFRNONAA   < >  --  41* 30* 32*  GFRAA   < >  --  47* 34* 37*  ANIONGAP   < >  --  11 7 10   < > = values in this interval not displayed.     Hematology Recent Labs  Lab 05/19/20 0700 05/20/20 0229 05/21/20 0436  WBC 9.2 8.8 8.7  RBC 3.59* 3.79* 3.66*  HGB 11.3* 11.9* 11.4*  HCT 33.9* 35.7* 34.0*  MCV 94.4 94.2 92.9  MCH 31.5 31.4 31.1  MCHC 33.3 33.3 33.5  RDW 12.2 12.1 12.0  PLT 257 268 262    Radiology    No results found.  Cardiac Studies   Echo 05/18/20 1. Left ventricular ejection fraction, by estimation, is 55 to 60%. The  left ventricle has normal function. The left ventricle has no regional  wall motion abnormalities. There is mild left ventricular hypertrophy.  Left ventricular diastolic parameters  are indeterminate.  2. Right ventricular systolic function is normal. The right ventricular  size is normal.  3. Left atrial size was mildly dilated.  4. The mitral valve is normal in structure. Trivial mitral valve  regurgitation. No evidence of mitral stenosis.  5. The aortic valve has an indeterminant number of cusps. Aortic valve  regurgitation is not visualized. No aortic stenosis is present.  6. The inferior vena cava is normal in size with greater than 50%  respiratory variability, suggesting  right atrial pressure of 3 mmHg.   Stress test 05/18/2020  Mild progression of baseline inferior and lateral ST/T changes after regadenoson  Chest pain after regadenoson, mild improvement with sublignual nitroglycerin x 2  Findings consistent with large inferior/inferoapical/inferolateral prior myocardial infarction with high degree of peri-infarct ischemia. Large inferoseptal/septal infarct with high degree of peri-infarct ischemia. Overall SSS 25, SRS 6, SDS 19  This is a high risk study.  The left ventricular ejection fraction is normal (55-65%).    Patient Profile     79 y.o. female with past medical history of HTN, HLD, Stage 3 CKD(s/p L nephrectomy in 1970's complicated by abdominal wall hernia)and Type 2 DMwho was transferred from [place] with chest pain, negative troponin, abnormal EKG and abnormal stress test. Also possible L sided PNA.  Assessment & Plan    1. [place] - Stress test at [facility] was abnormal as above. Transferred to [facility] for cath today. No recurrent chest pain. Echo showed LVEF of 55-60%. No WM abnormality.  - Hx of L nephrectomy and contrast dye allergy>> being premedicated. Renal function minimally up from baseline. Limit dye.  - Continue ASA and statin.  2. Acute on CKD III with L nephrectomy -Seems baseline creatinine around 1.3 - Scr here 1.4>>1.26>>1.64>>1.53 - Follow renal function closely following cath  3. DM - A1c 6.8 - Metformin on hold - Per primary team  4. HLD - 05/18/2020: Cholesterol 159; HDL 29; LDL Cholesterol 73; Triglycerides 286; VLDL 57  - Continue Lipitor 40mg qd   5. Possible L side pneumonia - Per primary team    For questions or updates, please contact [facility] Please consult [email or web address] for contact info under        Signed, Bhavinkumar Bhagat, PA  05/21/2020, 8:59 AM    Patient seen, examined. Available data reviewed. Agree with findings, assessment, and plan as outlined by Vin Bhagat, PA-C.  The patient is pretty  tangential this morning, but understands   the plan for cardiac catheterization and possible PCI today.  She is an elderly woman in no distress.  Lungs are clear, heart is regular rate and rhythm with no murmur gallop, abdomen soft and nontender, extremities have no edema.  I have reviewed all available records.  She has chest pain with typical and atypical features with normal high-sensitivity troponin.  However, she has a high risk Myoview scan with inferior infarct and a large area of peri-infarct ischemia.  She understands our plan for cardiac catheterization and possible PCI today.  We will take measures to limit contrast as much as possible in the setting of a single kidney and stage III chronic kidney disease.  She remains on IV unfractionated heparin.  No chest pain overnight.  All of her questions were answered.  She has been premedicated for contrast allergy.  Ramses Klecka, M.D. 05/21/2020 9:45 AM   

## 2020-05-21 NOTE — H&P (View-Only) (Signed)
Progress Note  Patient Name: [name] [name] Date of Encounter: 05/21/2020  [facility] Cardiologist: Dina Rich, MD   Subjective   No chest pain and shortness of breath. Worried about her blood sugar.   Inpatient Medications    Scheduled Meds: . aspirin EC  81 mg Oral QPM  . atorvastatin  40 mg Oral Daily  . diphenhydrAMINE  50 mg Oral Once   Or  . diphenhydrAMINE  50 mg Intravenous Once  . guaiFENesin  600 mg Oral BID  . insulin aspart  0-5 Units Subcutaneous QHS  . insulin aspart  0-9 Units Subcutaneous TID WC  . sodium chloride flush  3 mL Intravenous Q12H   Continuous Infusions: . sodium chloride    . sodium chloride 1 mL/kg/hr (05/21/20 0610)  . heparin 800 Units/hr (05/21/20 0610)   PRN Meds: sodium chloride, acetaminophen **OR** acetaminophen, nitroGLYCERIN, ondansetron **OR** ondansetron (ZOFRAN) IV, polyethylene glycol, sodium chloride flush   Vital Signs    Vitals:   05/20/20 2054 05/21/20 0104 05/21/20 0641 05/21/20 0831  BP: (!) 138/53 136/87 (!) 153/71 (!) 132/98  Pulse: 75 82 76 83  Resp: 18 20 20 20   Temp: 98.3 F (36.8 C) 98.2 F (36.8 C) 97.6 F (36.4 C) 97.7 F (36.5 C)  TempSrc: Oral Oral Oral Oral  SpO2: 93% 96% 95% 96%  Weight:  70.7 kg 70.8 kg   Height:        Intake/Output Summary (Last 24 hours) at 05/21/2020 0859 Last data filed at 05/21/2020 0600 Gross per 24 hour  Intake 741 ml  Output 300 ml  Net 441 ml   Last 3 Weights 05/21/2020 05/21/2020 05/20/2020  Weight (lbs) 156 lb 1.6 oz 155 lb 12.8 oz 154 lb 1.6 oz  Weight (kg) 70.806 kg 70.67 kg 69.899 kg      Telemetry    NSR with PACS- Personally Reviewed  ECG  No new tracing   Physical Exam   GEN: No acute distress.   Neck: No JVD Cardiac: RRR, no murmurs, rubs, or gallops.  Respiratory: Clear to auscultation bilaterally. GI: Soft, nontender, non-distended  MS: No edema; No deformity. Neuro:  Nonfocal  Psych: Normal affect   Labs    High Sensitivity Troponin:    Recent Labs  Lab 05/17/20 1538 05/17/20 1722 05/17/20 1951 05/18/20 1255 05/18/20 1452  TROPONINIHS 9 12 13 9 14       Chemistry Recent Labs  Lab 05/17/20 1538 05/17/20 1539 05/20/20 0229 05/21/20 0436 05/21/20 0731  NA   < >  --  138 134* 134*  K   < >  --  4.8 5.6* 5.1  CL   < >  --  105 105 104  CO2   < >  --  22 22 20*  GLUCOSE   < >  --  157* 314* 262*  BUN   < >  --  23 35* 33*  CREATININE   < >  --  1.26* 1.64* 1.53*  CALCIUM   < >  --  8.9 9.1 9.2  PROT  --  7.0  --   --   --   ALBUMIN  --  3.8  --   --   --   AST  --  17  --   --   --   ALT  --  18  --   --   --   ALKPHOS  --  48  --   --   --  BILITOT  --  0.4  --   --   --   GFRNONAA   < >  --  41* 30* 32*  GFRAA   < >  --  47* 34* 37*  ANIONGAP   < >  --  11 7 10    < > = values in this interval not displayed.     Hematology Recent Labs  Lab 05/19/20 0700 05/20/20 0229 05/21/20 0436  WBC 9.2 8.8 8.7  RBC 3.59* 3.79* 3.66*  HGB 11.3* 11.9* 11.4*  HCT 33.9* 35.7* 34.0*  MCV 94.4 94.2 92.9  MCH 31.5 31.4 31.1  MCHC 33.3 33.3 33.5  RDW 12.2 12.1 12.0  PLT 257 268 262    Radiology    No results found.  Cardiac Studies   Echo 05/18/20 1. Left ventricular ejection fraction, by estimation, is 55 to 60%. The  left ventricle has normal function. The left ventricle has no regional  wall motion abnormalities. There is mild left ventricular hypertrophy.  Left ventricular diastolic parameters  are indeterminate.  2. Right ventricular systolic function is normal. The right ventricular  size is normal.  3. Left atrial size was mildly dilated.  4. The mitral valve is normal in structure. Trivial mitral valve  regurgitation. No evidence of mitral stenosis.  5. The aortic valve has an indeterminant number of cusps. Aortic valve  regurgitation is not visualized. No aortic stenosis is present.  6. The inferior vena cava is normal in size with greater than 50%  respiratory variability, suggesting  right atrial pressure of 3 mmHg.   Stress test 05/18/2020  Mild progression of baseline inferior and lateral ST/T changes after regadenoson  Chest pain after regadenoson, mild improvement with sublignual nitroglycerin x 2  Findings consistent with large inferior/inferoapical/inferolateral prior myocardial infarction with high degree of peri-infarct ischemia. Large inferoseptal/septal infarct with high degree of peri-infarct ischemia. Overall SSS 25, SRS 6, SDS 19  This is a high risk study.  The left ventricular ejection fraction is normal (55-65%).    Patient Profile     79 y.o. female with past medical history of HTN, HLD, Stage 3 CKD(s/p L nephrectomy in 1970's complicated by abdominal wall hernia)and Type 2 DMwho was [facility], negative troponin, abnormal EKG and abnormal stress test. Also possible L sided PNA.  Assessment & Plan    1. [facility] - Stress test at [facility] was abnormal as above. Transferred to [facility] for cath today. No recurrent chest pain. Echo showed LVEF of 55-60%. No WM abnormality.  - Hx of L nephrectomy and contrast dye allergy>> being premedicated. Renal function minimally up from baseline. Limit dye.  - Continue ASA and statin.  2. Acute on CKD III with L nephrectomy -Seems baseline creatinine around 1.3 - Scr here 1.4>>1.26>>1.64>>1.53 - Follow renal function closely following cath  3. DM - A1c 6.8 - Metformin on hold - Per primary team  4. HLD - 05/18/2020: Cholesterol 159; HDL 29; LDL Cholesterol 73; Triglycerides 286; VLDL 57  - Continue Lipitor 40mg  qd   5. Possible L side pneumonia - Per primary team    For questions or updates, please contact [facility] Please consult [email or web address] for contact info under        Signed8/03/2020, PA  05/21/2020, 8:59 AM    Patient seen, examined. Available data reviewed. Agree with findings, assessment, and plan as outlined by Manson Passey, PA-C.  The patient is pretty  tangential this morning, but understands  the plan for cardiac catheterization and possible PCI today.  She is an elderly woman in no distress.  Lungs are clear, heart is regular rate and rhythm with no murmur gallop, abdomen soft and nontender, extremities have no edema.  I have reviewed all available records.  She has chest pain with typical and atypical features with normal high-sensitivity troponin.  However, she has a high risk Myoview scan with inferior infarct and a large area of peri-infarct ischemia.  She understands our plan for cardiac catheterization and possible PCI today.  We will take measures to limit contrast as much as possible in the setting of a single kidney and stage III chronic kidney disease.  She remains on IV unfractionated heparin.  No chest pain overnight.  All of her questions were answered.  She has been premedicated for contrast allergy.  Sherren Mocha, M.D. 05/21/2020 9:45 AM

## 2020-05-21 NOTE — Anesthesia Preprocedure Evaluation (Addendum)
Anesthesia Evaluation  Patient identified by MRN, date of birth, ID band Patient awake    Reviewed: Allergy & Precautions, NPO status , Patient's Chart, lab work & pertinent test results, reviewed documented beta blocker date and time   Airway Mallampati: III  TM Distance: >3 FB Neck ROM: Full    Dental  (+) Dental Advisory Given, Chipped, Missing,    Pulmonary shortness of breath, COPD,    Pulmonary exam normal breath sounds clear to auscultation       Cardiovascular hypertension, Pt. on medications + CAD  Normal cardiovascular exam Rhythm:Regular Rate:Normal  Echo 05/18/20: 1. Left ventricular ejection fraction, by estimation, is 55 to 60%. The  left ventricle has normal function. The left ventricle has no regional  wall motion abnormalities. There is mild left ventricular hypertrophy.  Left ventricular diastolic parameters  are indeterminate.  2. Right ventricular systolic function is normal. The right ventricular  size is normal.  3. Left atrial size was mildly dilated.  4. The mitral valve is normal in structure. Trivial mitral valve  regurgitation. No evidence of mitral stenosis.  5. The aortic valve has an indeterminant number of cusps. Aortic valve  regurgitation is not visualized. No aortic stenosis is present.  6. The inferior vena cava is normal in size with greater than 50%  respiratory variability, suggesting right atrial pressure of 3 mmHg.    Neuro/Psych PSYCHIATRIC DISORDERS Anxiety Depression negative neurological ROS     GI/Hepatic Neg liver ROS, GERD  ,  Endo/Other  diabetes, Type 2, Oral Hypoglycemic AgentsObesity   Renal/GU Renal InsufficiencyRenal diseases/p L nephrectomy in 1970's complicated by abdominal wall hernia     Musculoskeletal  (+) Arthritis , Osteoarthritis,    Abdominal   Peds  Hematology  (+) Blood dyscrasia, anemia ,   Anesthesia Other Findings    Reproductive/Obstetrics                            Anesthesia Physical Anesthesia Plan  ASA: IV  Anesthesia Plan: General   Post-op Pain Management:    Induction: Intravenous  PONV Risk Score and Plan: 3 and Treatment may vary due to age or medical condition  Airway Management Planned: Oral ETT  Additional Equipment: Arterial line, CVP, TEE and Ultrasound Guidance Line Placement  Intra-op Plan:   Post-operative Plan: Post-operative intubation/ventilation  Informed Consent: I have reviewed the patients History and Physical, chart, labs and discussed the procedure including the risks, benefits and alternatives for the proposed anesthesia with the patient or authorized representative who has indicated his/her understanding and acceptance.     Dental advisory given  Plan Discussed with: CRNA  Anesthesia Plan Comments:        Anesthesia Quick Evaluation

## 2020-05-21 NOTE — Progress Notes (Signed)
ANTICOAGULATION CONSULT NOTE   Pharmacy Consult for:  heparin dosing Indication: ACS/STEMI  Allergies  Allergen Reactions  . Contrast Media [Iodinated Diagnostic Agents]     Unknown reaction  . Morphine And Related   . Other     Pt has multiple allergies per pt but unsure of all.   . Penicillins     Unknown reaction  . Codeine Rash    Patient Measurements: Height: 4\' 11"  (149.9 cm) Weight: 70.8 kg (156 lb 1.6 oz) IBW/kg (Calculated) : 43.2 Heparin Dosing Weight:  HEPARIN DW (KG): 59 HEPARIN DW (KG): 59  Vital Signs: Temp: 97.9 F (36.6 C) (06/07 1116) Temp Source: Oral (06/07 1116) BP: 145/78 (06/07 1223) Pulse Rate: 90 (06/07 1223)  Labs: Recent Labs    05/18/20 1255 05/18/20 1452 05/18/20 2102 05/19/20 0700 05/19/20 0700 05/19/20 1153 05/20/20 0229 05/21/20 0436 05/21/20 0731  HGB  --   --   --  11.3*   < >  --  11.9* 11.4*  --   HCT  --   --   --  33.9*  --   --  35.7* 34.0*  --   PLT  --   --   --  257  --   --  268 262  --   HEPARINUNFRC  --   --    < > 0.40   < > 0.45 0.65 0.89*  --   CREATININE  --   --   --   --   --   --  1.26* 1.64* 1.53*  TROPONINIHS 9 14  --   --   --   --   --   --   --    < > = values in this interval not displayed.   Estimated Creatinine Clearance: 25.9 mL/min (A) (by C-G formula based on SCr of 1.53 mg/dL (H)).    Assessment: 79 y.o. female presenting from [facility] to [facility] for LHC due to atypical chest pain. NO AC PTA. Pharmacy consulted to dose heparin.  Heparin level up to supratherapeutic (0.89) on gtt at 950 units/hr. Small amount of blood when wiped rectum this morning - likely from hemorrhoid. Cath today demonstrated 3vCAD, CABG consult ordered, pharmacy to restart heparin 8hr after sheath removal.  Goal of Therapy:  Heparin level 0.3-0.7 units/ml Monitor platelets by anticoagulation protocol: Yes   Plan:  -Restart heparin 800 units/h no bolus at 2030 -Check 8hr heparin level after restart   70,  PharmD, BCPS Clinical Pharmacist [phone] Please check AMION for all [facility] numbers 05/21/2020

## 2020-05-22 ENCOUNTER — Inpatient Hospital Stay (HOSPITAL_COMMUNITY): Payer: Medicare Other

## 2020-05-22 ENCOUNTER — Inpatient Hospital Stay (HOSPITAL_COMMUNITY)
Admission: EM | Disposition: A | Discharge status: Skilled Nursing Facility | Payer: Self-pay | Source: Home / Self Care | Attending: Thoracic Surgery (Cardiothoracic Vascular Surgery)

## 2020-05-22 HISTORY — PX: CORONARY ARTERY BYPASS GRAFT: SHX141

## 2020-05-22 HISTORY — PX: TEE WITHOUT CARDIOVERSION: SHX5443

## 2020-05-22 HISTORY — PX: VEIN HARVEST: SHX6363

## 2020-05-22 LAB — POCT I-STAT 7, (LYTES, BLD GAS, ICA,H+H)
Acid-base deficit: 5 mmol/L — ABNORMAL HIGH (ref 0.0–2.0)
Acid-base deficit: 5 mmol/L — ABNORMAL HIGH (ref 0.0–2.0)
Acid-base deficit: 3 mmol/L — ABNORMAL HIGH (ref 0.0–2.0)
Acid-base deficit: 1 mmol/L (ref 0.0–2.0)
Acid-base deficit: 6 mmol/L — ABNORMAL HIGH (ref 0.0–2.0)
Acid-base deficit: 2 mmol/L (ref 0.0–2.0)
Acid-base deficit: 5 mmol/L — ABNORMAL HIGH (ref 0.0–2.0)
Bicarbonate: 21.4 mmol/L (ref 20.0–28.0)
Bicarbonate: 20.3 mmol/L (ref 20.0–28.0)
Bicarbonate: 20.8 mmol/L (ref 20.0–28.0)
Bicarbonate: 23.7 mmol/L (ref 20.0–28.0)
Bicarbonate: 21.4 mmol/L (ref 20.0–28.0)
Bicarbonate: 24.2 mmol/L (ref 20.0–28.0)
Bicarbonate: 20.8 mmol/L (ref 20.0–28.0)
Calcium, Ion: 1.33 mmol/L (ref 1.15–1.40)
Calcium, Ion: 0.97 mmol/L — ABNORMAL LOW (ref 1.15–1.40)
Calcium, Ion: 1.06 mmol/L — ABNORMAL LOW (ref 1.15–1.40)
Calcium, Ion: 1.38 mmol/L (ref 1.15–1.40)
Calcium, Ion: 1.34 mmol/L (ref 1.15–1.40)
Calcium, Ion: 1.18 mmol/L (ref 1.15–1.40)
Calcium, Ion: 1.17 mmol/L (ref 1.15–1.40)
HCT: 30 % — ABNORMAL LOW (ref 36.0–46.0)
HCT: 20 % — ABNORMAL LOW (ref 36.0–46.0)
HCT: 24 % — ABNORMAL LOW (ref 36.0–46.0)
HCT: 26 % — ABNORMAL LOW (ref 36.0–46.0)
HCT: 32 % — ABNORMAL LOW (ref 36.0–46.0)
HCT: 27 % — ABNORMAL LOW (ref 36.0–46.0)
HCT: 30 % — ABNORMAL LOW (ref 36.0–46.0)
Hemoglobin: 10.2 g/dL — ABNORMAL LOW (ref 12.0–15.0)
Hemoglobin: 6.8 g/dL — CL (ref 12.0–15.0)
Hemoglobin: 8.2 g/dL — ABNORMAL LOW (ref 12.0–15.0)
Hemoglobin: 8.8 g/dL — ABNORMAL LOW (ref 12.0–15.0)
Hemoglobin: 10.9 g/dL — ABNORMAL LOW (ref 12.0–15.0)
Hemoglobin: 9.2 g/dL — ABNORMAL LOW (ref 12.0–15.0)
Hemoglobin: 10.2 g/dL — ABNORMAL LOW (ref 12.0–15.0)
O2 Saturation: 100 %
O2 Saturation: 100 %
O2 Saturation: 100 %
O2 Saturation: 100 %
O2 Saturation: 98 %
O2 Saturation: 98 %
O2 Saturation: 97 %
Patient temperature: 94.6
Patient temperature: 36.2
Patient temperature: 37.1
Potassium: 4.5 mmol/L (ref 3.5–5.1)
Potassium: 4.3 mmol/L (ref 3.5–5.1)
Potassium: 5.1 mmol/L (ref 3.5–5.1)
Potassium: 4.3 mmol/L (ref 3.5–5.1)
Potassium: 4.1 mmol/L (ref 3.5–5.1)
Potassium: 4.8 mmol/L (ref 3.5–5.1)
Potassium: 4.6 mmol/L (ref 3.5–5.1)
Sodium: 141 mmol/L (ref 135–145)
Sodium: 138 mmol/L (ref 135–145)
Sodium: 138 mmol/L (ref 135–145)
Sodium: 141 mmol/L (ref 135–145)
Sodium: 143 mmol/L (ref 135–145)
Sodium: 143 mmol/L (ref 135–145)
Sodium: 141 mmol/L (ref 135–145)
TCO2: 23 mmol/L (ref 22–32)
TCO2: 21 mmol/L — ABNORMAL LOW (ref 22–32)
TCO2: 22 mmol/L (ref 22–32)
TCO2: 25 mmol/L (ref 22–32)
TCO2: 23 mmol/L (ref 22–32)
TCO2: 26 mmol/L (ref 22–32)
TCO2: 22 mmol/L (ref 22–32)
pCO2 arterial: 45.3 mmHg (ref 32.0–48.0)
pCO2 arterial: 35.7 mmHg (ref 32.0–48.0)
pCO2 arterial: 30.8 mmHg — ABNORMAL LOW (ref 32.0–48.0)
pCO2 arterial: 37.8 mmHg (ref 32.0–48.0)
pCO2 arterial: 45.4 mmHg (ref 32.0–48.0)
pCO2 arterial: 43.3 mmHg (ref 32.0–48.0)
pCO2 arterial: 40.3 mmHg (ref 32.0–48.0)
pH, Arterial: 7.282 — ABNORMAL LOW (ref 7.350–7.450)
pH, Arterial: 7.363 (ref 7.350–7.450)
pH, Arterial: 7.437 (ref 7.350–7.450)
pH, Arterial: 7.405 (ref 7.350–7.450)
pH, Arterial: 7.269 — ABNORMAL LOW (ref 7.350–7.450)
pH, Arterial: 7.351 (ref 7.350–7.450)
pH, Arterial: 7.322 — ABNORMAL LOW (ref 7.350–7.450)
pO2, Arterial: 491 mmHg — ABNORMAL HIGH (ref 83.0–108.0)
pO2, Arterial: 397 mmHg — ABNORMAL HIGH (ref 83.0–108.0)
pO2, Arterial: 281 mmHg — ABNORMAL HIGH (ref 83.0–108.0)
pO2, Arterial: 484 mmHg — ABNORMAL HIGH (ref 83.0–108.0)
pO2, Arterial: 102 mmHg (ref 83.0–108.0)
pO2, Arterial: 114 mmHg — ABNORMAL HIGH (ref 83.0–108.0)
pO2, Arterial: 101 mmHg (ref 83.0–108.0)

## 2020-05-22 LAB — BASIC METABOLIC PANEL
Anion gap: 9 (ref 5–15)
Anion gap: 8 (ref 5–15)
BUN: 34 mg/dL — ABNORMAL HIGH (ref 8–23)
BUN: 23 mg/dL (ref 8–23)
CO2: 21 mmol/L — ABNORMAL LOW (ref 22–32)
CO2: 20 mmol/L — ABNORMAL LOW (ref 22–32)
Calcium: 9 mg/dL (ref 8.9–10.3)
Calcium: 8.3 mg/dL — ABNORMAL LOW (ref 8.9–10.3)
Chloride: 105 mmol/L (ref 98–111)
Chloride: 111 mmol/L (ref 98–111)
Creatinine, Ser: 1.49 mg/dL — ABNORMAL HIGH (ref 0.44–1.00)
Creatinine, Ser: 1.11 mg/dL — ABNORMAL HIGH (ref 0.44–1.00)
GFR calc Af Amer: 39 mL/min — ABNORMAL LOW (ref >60)
GFR calc Af Amer: 55 mL/min — ABNORMAL LOW (ref >60)
GFR calc non Af Amer: 33 mL/min — ABNORMAL LOW (ref >60)
GFR calc non Af Amer: 48 mL/min — ABNORMAL LOW (ref >60)
Glucose, Bld: 268 mg/dL — ABNORMAL HIGH (ref 70–99)
Glucose, Bld: 168 mg/dL — ABNORMAL HIGH (ref 70–99)
Potassium: 5.2 mmol/L — ABNORMAL HIGH (ref 3.5–5.1)
Potassium: 4.7 mmol/L (ref 3.5–5.1)
Sodium: 135 mmol/L (ref 135–145)
Sodium: 139 mmol/L (ref 135–145)

## 2020-05-22 LAB — BLOOD GAS, ARTERIAL
Acid-base deficit: 5.5 mmol/L — ABNORMAL HIGH (ref 0.0–2.0)
Bicarbonate: 19.3 mmol/L — ABNORMAL LOW (ref 20.0–28.0)
Drawn by: 34719
FIO2: 21
O2 Saturation: 97 %
Patient temperature: 37
pCO2 arterial: 37.5 mmHg (ref 32.0–48.0)
pH, Arterial: 7.332 — ABNORMAL LOW (ref 7.350–7.450)
pO2, Arterial: 97.6 mmHg (ref 83.0–108.0)

## 2020-05-22 LAB — POCT I-STAT, CHEM 8
BUN: 30 mg/dL — ABNORMAL HIGH (ref 8–23)
BUN: 29 mg/dL — ABNORMAL HIGH (ref 8–23)
BUN: 26 mg/dL — ABNORMAL HIGH (ref 8–23)
BUN: 30 mg/dL — ABNORMAL HIGH (ref 8–23)
BUN: 28 mg/dL — ABNORMAL HIGH (ref 8–23)
Calcium, Ion: 1.32 mmol/L (ref 1.15–1.40)
Calcium, Ion: 1.3 mmol/L (ref 1.15–1.40)
Calcium, Ion: 1.03 mmol/L — ABNORMAL LOW (ref 1.15–1.40)
Calcium, Ion: 1.09 mmol/L — ABNORMAL LOW (ref 1.15–1.40)
Calcium, Ion: 1.39 mmol/L (ref 1.15–1.40)
Chloride: 109 mmol/L (ref 98–111)
Chloride: 109 mmol/L (ref 98–111)
Chloride: 106 mmol/L (ref 98–111)
Chloride: 106 mmol/L (ref 98–111)
Chloride: 108 mmol/L (ref 98–111)
Creatinine, Ser: 1.2 mg/dL — ABNORMAL HIGH (ref 0.44–1.00)
Creatinine, Ser: 1.2 mg/dL — ABNORMAL HIGH (ref 0.44–1.00)
Creatinine, Ser: 1.1 mg/dL — ABNORMAL HIGH (ref 0.44–1.00)
Creatinine, Ser: 1.1 mg/dL — ABNORMAL HIGH (ref 0.44–1.00)
Creatinine, Ser: 1 mg/dL (ref 0.44–1.00)
Glucose, Bld: 209 mg/dL — ABNORMAL HIGH (ref 70–99)
Glucose, Bld: 174 mg/dL — ABNORMAL HIGH (ref 70–99)
Glucose, Bld: 175 mg/dL — ABNORMAL HIGH (ref 70–99)
Glucose, Bld: 198 mg/dL — ABNORMAL HIGH (ref 70–99)
Glucose, Bld: 194 mg/dL — ABNORMAL HIGH (ref 70–99)
HCT: 31 % — ABNORMAL LOW (ref 36.0–46.0)
HCT: 26 % — ABNORMAL LOW (ref 36.0–46.0)
HCT: 22 % — ABNORMAL LOW (ref 36.0–46.0)
HCT: 25 % — ABNORMAL LOW (ref 36.0–46.0)
HCT: 28 % — ABNORMAL LOW (ref 36.0–46.0)
Hemoglobin: 10.5 g/dL — ABNORMAL LOW (ref 12.0–15.0)
Hemoglobin: 8.8 g/dL — ABNORMAL LOW (ref 12.0–15.0)
Hemoglobin: 7.5 g/dL — ABNORMAL LOW (ref 12.0–15.0)
Hemoglobin: 8.5 g/dL — ABNORMAL LOW (ref 12.0–15.0)
Hemoglobin: 9.5 g/dL — ABNORMAL LOW (ref 12.0–15.0)
Potassium: 4.4 mmol/L (ref 3.5–5.1)
Potassium: 4.1 mmol/L (ref 3.5–5.1)
Potassium: 5 mmol/L (ref 3.5–5.1)
Potassium: 5.2 mmol/L — ABNORMAL HIGH (ref 3.5–5.1)
Potassium: 4.3 mmol/L (ref 3.5–5.1)
Sodium: 140 mmol/L (ref 135–145)
Sodium: 139 mmol/L (ref 135–145)
Sodium: 136 mmol/L (ref 135–145)
Sodium: 138 mmol/L (ref 135–145)
Sodium: 139 mmol/L (ref 135–145)
TCO2: 22 mmol/L (ref 22–32)
TCO2: 20 mmol/L — ABNORMAL LOW (ref 22–32)
TCO2: 20 mmol/L — ABNORMAL LOW (ref 22–32)
TCO2: 23 mmol/L (ref 22–32)
TCO2: 23 mmol/L (ref 22–32)

## 2020-05-22 LAB — CBC
HCT: 32.4 % — ABNORMAL LOW (ref 36.0–46.0)
HCT: 31.9 % — ABNORMAL LOW (ref 36.0–46.0)
HCT: 32.6 % — ABNORMAL LOW (ref 36.0–46.0)
Hemoglobin: 10.8 g/dL — ABNORMAL LOW (ref 12.0–15.0)
Hemoglobin: 10.5 g/dL — ABNORMAL LOW (ref 12.0–15.0)
Hemoglobin: 11 g/dL — ABNORMAL LOW (ref 12.0–15.0)
MCH: 31.4 pg (ref 26.0–34.0)
MCH: 31 pg (ref 26.0–34.0)
MCH: 31.2 pg (ref 26.0–34.0)
MCHC: 33.3 g/dL (ref 30.0–36.0)
MCHC: 32.9 g/dL (ref 30.0–36.0)
MCHC: 33.7 g/dL (ref 30.0–36.0)
MCV: 94.2 fL (ref 80.0–100.0)
MCV: 94.1 fL (ref 80.0–100.0)
MCV: 92.4 fL (ref 80.0–100.0)
Platelets: 251 10*3/uL (ref 150–400)
Platelets: 128 10*3/uL — ABNORMAL LOW (ref 150–400)
Platelets: 137 10*3/uL — ABNORMAL LOW (ref 150–400)
RBC: 3.44 MIL/uL — ABNORMAL LOW (ref 3.87–5.11)
RBC: 3.39 MIL/uL — ABNORMAL LOW (ref 3.87–5.11)
RBC: 3.53 MIL/uL — ABNORMAL LOW (ref 3.87–5.11)
RDW: 12.3 % (ref 11.5–15.5)
RDW: 12.9 % (ref 11.5–15.5)
RDW: 13.2 % (ref 11.5–15.5)
WBC: 13.6 10*3/uL — ABNORMAL HIGH (ref 4.0–10.5)
WBC: 18.2 10*3/uL — ABNORMAL HIGH (ref 4.0–10.5)
WBC: 15.8 10*3/uL — ABNORMAL HIGH (ref 4.0–10.5)
nRBC: 0 % (ref 0.0–0.2)
nRBC: 0 % (ref 0.0–0.2)
nRBC: 0 % (ref 0.0–0.2)

## 2020-05-22 LAB — URINALYSIS, ROUTINE W REFLEX MICROSCOPIC
Bacteria, UA: NONE SEEN
Bilirubin Urine: NEGATIVE
Glucose, UA: >=500 mg/dL — AB
Ketones, ur: NEGATIVE mg/dL
Nitrite: NEGATIVE
Protein, ur: NEGATIVE mg/dL
Specific Gravity, Urine: 1.03 (ref 1.005–1.030)
pH: 5 (ref 5.0–8.0)

## 2020-05-22 LAB — ECHO INTRAOPERATIVE TEE
Height: 59 in
Weight: 2469.15 oz

## 2020-05-22 LAB — GLUCOSE, CAPILLARY
Glucose-Capillary: 119 mg/dL — ABNORMAL HIGH (ref 70–99)
Glucose-Capillary: 138 mg/dL — ABNORMAL HIGH (ref 70–99)
Glucose-Capillary: 141 mg/dL — ABNORMAL HIGH (ref 70–99)
Glucose-Capillary: 149 mg/dL — ABNORMAL HIGH (ref 70–99)
Glucose-Capillary: 153 mg/dL — ABNORMAL HIGH (ref 70–99)
Glucose-Capillary: 171 mg/dL — ABNORMAL HIGH (ref 70–99)
Glucose-Capillary: 190 mg/dL — ABNORMAL HIGH (ref 70–99)
Glucose-Capillary: 194 mg/dL — ABNORMAL HIGH (ref 70–99)
Glucose-Capillary: 210 mg/dL — ABNORMAL HIGH (ref 70–99)
Glucose-Capillary: 225 mg/dL — ABNORMAL HIGH (ref 70–99)
Glucose-Capillary: 234 mg/dL — ABNORMAL HIGH (ref 70–99)
Glucose-Capillary: 96 mg/dL (ref 70–99)

## 2020-05-22 LAB — PLATELET COUNT: Platelets: 135 10*3/uL — ABNORMAL LOW (ref 150–400)

## 2020-05-22 LAB — MAGNESIUM: Magnesium: 3 mg/dL — ABNORMAL HIGH (ref 1.7–2.4)

## 2020-05-22 LAB — APTT: aPTT: 30 seconds (ref 24–36)

## 2020-05-22 LAB — PROTIME-INR
INR: 1.3 — ABNORMAL HIGH (ref 0.8–1.2)
Prothrombin Time: 15.8 seconds — ABNORMAL HIGH (ref 11.4–15.2)

## 2020-05-22 LAB — PREPARE RBC (CROSSMATCH)

## 2020-05-22 LAB — HEMOGLOBIN AND HEMATOCRIT, BLOOD
HCT: 25.2 % — ABNORMAL LOW (ref 36.0–46.0)
Hemoglobin: 8.4 g/dL — ABNORMAL LOW (ref 12.0–15.0)

## 2020-05-22 LAB — HEPARIN LEVEL (UNFRACTIONATED): Heparin Unfractionated: 0.41 IU/mL (ref 0.30–0.70)

## 2020-05-22 SURGERY — CORONARY ARTERY BYPASS GRAFTING (CABG)
Anesthesia: General | Site: Chest | Laterality: Right

## 2020-05-22 MED ORDER — ACETAMINOPHEN 650 MG RE SUPP
650.0000 mg | Freq: Once | RECTAL | Status: AC
  Administered 2020-05-22: 650 mg via RECTAL
  Filled 2020-05-22: qty 1

## 2020-05-22 MED ORDER — SODIUM CHLORIDE (PF) 0.9 % IJ SOLN
OROMUCOSAL | Status: DC | PRN
  Administered 2020-05-22 (×3): 4 mL via TOPICAL

## 2020-05-22 MED ORDER — PHENYLEPHRINE 40 MCG/ML (10ML) SYRINGE FOR IV PUSH (FOR BLOOD PRESSURE SUPPORT)
PREFILLED_SYRINGE | INTRAVENOUS | Status: DC | PRN
  Administered 2020-05-22: 120 ug via INTRAVENOUS
  Administered 2020-05-22: 80 ug via INTRAVENOUS

## 2020-05-22 MED ORDER — MIDAZOLAM HCL (PF) 10 MG/2ML IJ SOLN
INTRAMUSCULAR | Status: AC
  Filled 2020-05-22: qty 2

## 2020-05-22 MED ORDER — METOPROLOL TARTRATE 5 MG/5ML IV SOLN: 2.5000 mg | INTRAVENOUS | Status: DC | PRN

## 2020-05-22 MED ORDER — LACTATED RINGERS IV SOLN: INTRAVENOUS | Status: DC | PRN

## 2020-05-22 MED ORDER — NITROGLYCERIN IN D5W 200-5 MCG/ML-% IV SOLN
INTRAVENOUS | Status: DC | PRN
  Administered 2020-05-22: 5 ug/min via INTRAVENOUS

## 2020-05-22 MED ORDER — ROCURONIUM BROMIDE 10 MG/ML (PF) SYRINGE
PREFILLED_SYRINGE | INTRAVENOUS | Status: AC
  Filled 2020-05-22: qty 10

## 2020-05-22 MED ORDER — ROCURONIUM BROMIDE 10 MG/ML (PF) SYRINGE
PREFILLED_SYRINGE | INTRAVENOUS | Status: DC | PRN
  Administered 2020-05-22: 20 mg via INTRAVENOUS
  Administered 2020-05-22: 30 mg via INTRAVENOUS
  Administered 2020-05-22: 20 mg via INTRAVENOUS
  Administered 2020-05-22: 100 mg via INTRAVENOUS

## 2020-05-22 MED ORDER — PROPOFOL 10 MG/ML IV BOLUS
INTRAVENOUS | Status: AC
  Filled 2020-05-22: qty 20

## 2020-05-22 MED ORDER — METOPROLOL TARTRATE 25 MG/10 ML ORAL SUSPENSION: 12.5000 mg | Freq: Two times a day (BID) | ORAL | Status: DC

## 2020-05-22 MED ORDER — FAMOTIDINE IN NACL 20-0.9 MG/50ML-% IV SOLN
20.0000 mg | Freq: Two times a day (BID) | INTRAVENOUS | Status: AC
  Administered 2020-05-22: 20 mg via INTRAVENOUS
  Filled 2020-05-22: qty 50

## 2020-05-22 MED ORDER — HEPARIN SODIUM (PORCINE) 1000 UNIT/ML IJ SOLN
INTRAMUSCULAR | Status: DC | PRN
  Administered 2020-05-22: 24000 [IU] via INTRAVENOUS

## 2020-05-22 MED ORDER — 0.9 % SODIUM CHLORIDE (POUR BTL) OPTIME
TOPICAL | Status: DC | PRN
  Administered 2020-05-22: 2000 mL
  Administered 2020-05-22: 5000 mL

## 2020-05-22 MED ORDER — BISACODYL 5 MG PO TBEC
10.0000 mg | DELAYED_RELEASE_TABLET | Freq: Every day | ORAL | Status: DC
  Administered 2020-05-23 – 2020-06-01 (×9): 10 mg via ORAL
  Filled 2020-05-22 (×9): qty 2

## 2020-05-22 MED ORDER — LACTATED RINGERS IV SOLN: INTRAVENOUS | Status: DC

## 2020-05-22 MED ORDER — NITROGLYCERIN IN D5W 200-5 MCG/ML-% IV SOLN: 0.0000 ug/min | INTRAVENOUS | Status: DC

## 2020-05-22 MED ORDER — DEXMEDETOMIDINE HCL IN NACL 400 MCG/100ML IV SOLN
0.0000 ug/kg/h | INTRAVENOUS | Status: DC
  Administered 2020-05-22: 0.3 ug/kg/h via INTRAVENOUS

## 2020-05-22 MED ORDER — GLYCOPYRROLATE PF 0.2 MG/ML IJ SOSY
PREFILLED_SYRINGE | INTRAMUSCULAR | Status: AC
  Filled 2020-05-22: qty 1

## 2020-05-22 MED ORDER — CHLORHEXIDINE GLUCONATE 0.12 % MT SOLN: 15.0000 mL | OROMUCOSAL | Status: AC

## 2020-05-22 MED ORDER — DOCUSATE SODIUM 100 MG PO CAPS
200.0000 mg | ORAL_CAPSULE | Freq: Every day | ORAL | Status: DC
  Administered 2020-05-23 – 2020-06-01 (×10): 200 mg via ORAL
  Filled 2020-05-22 (×10): qty 2

## 2020-05-22 MED ORDER — SODIUM CHLORIDE 0.45 % IV SOLN: INTRAVENOUS | Status: DC | PRN

## 2020-05-22 MED ORDER — HEPARIN SODIUM (PORCINE) 1000 UNIT/ML IJ SOLN
INTRAMUSCULAR | Status: AC
  Filled 2020-05-22: qty 1

## 2020-05-22 MED ORDER — NOREPINEPHRINE 4 MG/250ML-% IV SOLN
0.0000 ug/min | INTRAVENOUS | Status: DC
  Administered 2020-05-23: 2 ug/min via INTRAVENOUS
  Filled 2020-05-22: qty 250

## 2020-05-22 MED ORDER — BISACODYL 10 MG RE SUPP
10.0000 mg | Freq: Every day | RECTAL | Status: DC
  Filled 2020-05-22: qty 1

## 2020-05-22 MED ORDER — FAMOTIDINE IN NACL 20-0.9 MG/50ML-% IV SOLN
INTRAVENOUS | Status: AC
  Administered 2020-05-22: 20 mg via INTRAVENOUS
  Filled 2020-05-22: qty 50

## 2020-05-22 MED ORDER — ALBUMIN HUMAN 5 % IV SOLN
250.0000 mL | INTRAVENOUS | Status: AC | PRN
  Administered 2020-05-22 (×2): 12.5 g via INTRAVENOUS

## 2020-05-22 MED ORDER — SODIUM CHLORIDE 0.9% FLUSH
3.0000 mL | Freq: Two times a day (BID) | INTRAVENOUS | Status: DC
  Administered 2020-05-23 – 2020-06-01 (×14): 3 mL via INTRAVENOUS

## 2020-05-22 MED ORDER — METOPROLOL TARTRATE 12.5 MG HALF TABLET: 12.5000 mg | ORAL_TABLET | Freq: Two times a day (BID) | ORAL | Status: DC

## 2020-05-22 MED ORDER — ALBUMIN HUMAN 5 % IV SOLN
INTRAVENOUS | Status: DC | PRN
Start: 2020-05-22 — End: 2020-05-22

## 2020-05-22 MED ORDER — MIDAZOLAM HCL 5 MG/5ML IJ SOLN
INTRAMUSCULAR | Status: DC | PRN
  Administered 2020-05-22: 1 mg via INTRAVENOUS
  Administered 2020-05-22 (×2): 3 mg via INTRAVENOUS

## 2020-05-22 MED ORDER — SODIUM CHLORIDE 0.9% FLUSH: 3.0000 mL | INTRAVENOUS | Status: DC | PRN

## 2020-05-22 MED ORDER — DOBUTAMINE IN D5W 4-5 MG/ML-% IV SOLN
2.5000 ug/kg/min | INTRAVENOUS | Status: DC
  Administered 2020-05-22: 5 ug/kg/min via INTRAVENOUS
  Filled 2020-05-22: qty 250

## 2020-05-22 MED ORDER — SODIUM CHLORIDE 0.9 % IV SOLN: 250.0000 mL | INTRAVENOUS | Status: DC

## 2020-05-22 MED ORDER — EPHEDRINE 5 MG/ML INJ
INTRAVENOUS | Status: AC
  Filled 2020-05-22: qty 20

## 2020-05-22 MED ORDER — SODIUM CHLORIDE 0.9% FLUSH
10.0000 mL | Freq: Two times a day (BID) | INTRAVENOUS | Status: DC
  Administered 2020-05-22 – 2020-05-23 (×2): 10 mL
  Administered 2020-05-23: 20 mL
  Administered 2020-05-24 – 2020-05-31 (×3): 10 mL

## 2020-05-22 MED ORDER — SUCCINYLCHOLINE CHLORIDE 200 MG/10ML IV SOSY
PREFILLED_SYRINGE | INTRAVENOUS | Status: AC
  Filled 2020-05-22: qty 10

## 2020-05-22 MED ORDER — ASPIRIN EC 325 MG PO TBEC
325.0000 mg | DELAYED_RELEASE_TABLET | Freq: Every day | ORAL | Status: DC
  Administered 2020-05-23 – 2020-06-01 (×10): 325 mg via ORAL
  Filled 2020-05-22 (×10): qty 1

## 2020-05-22 MED ORDER — VANCOMYCIN HCL 750 MG/150ML IV SOLN
750.0000 mg | Freq: Once | INTRAVENOUS | Status: AC
  Administered 2020-05-23: 750 mg via INTRAVENOUS
  Filled 2020-05-22 (×2): qty 150

## 2020-05-22 MED ORDER — SODIUM CHLORIDE 0.9 % IV SOLN
INTRAVENOUS | Status: DC | PRN
Start: 2020-05-22 — End: 2020-05-22

## 2020-05-22 MED ORDER — INSULIN REGULAR(HUMAN) IN NACL 100-0.9 UT/100ML-% IV SOLN
INTRAVENOUS | Status: DC
  Administered 2020-05-23: 5 [IU]/h via INTRAVENOUS
  Filled 2020-05-22: qty 100

## 2020-05-22 MED ORDER — PROTAMINE SULFATE 10 MG/ML IV SOLN
INTRAVENOUS | Status: AC
  Filled 2020-05-22: qty 25

## 2020-05-22 MED ORDER — EPHEDRINE SULFATE-NACL 50-0.9 MG/10ML-% IV SOSY
PREFILLED_SYRINGE | INTRAVENOUS | Status: DC | PRN
  Administered 2020-05-22: 10 mg via INTRAVENOUS
  Administered 2020-05-22 (×2): 5 mg via INTRAVENOUS
  Administered 2020-05-22 (×2): 10 mg via INTRAVENOUS
  Administered 2020-05-22: 5 mg via INTRAVENOUS
  Administered 2020-05-22: 10 mg via INTRAVENOUS

## 2020-05-22 MED ORDER — MAGNESIUM SULFATE 4 GM/100ML IV SOLN
INTRAVENOUS | Status: AC
  Administered 2020-05-22: 4 g via INTRAVENOUS
  Filled 2020-05-22: qty 100

## 2020-05-22 MED ORDER — MIDAZOLAM HCL 2 MG/2ML IJ SOLN
2.0000 mg | INTRAMUSCULAR | Status: DC | PRN
  Administered 2020-05-23: 2 mg via INTRAVENOUS
  Filled 2020-05-22: qty 2

## 2020-05-22 MED ORDER — ONDANSETRON HCL 4 MG/2ML IJ SOLN
4.0000 mg | Freq: Four times a day (QID) | INTRAMUSCULAR | Status: DC | PRN
  Administered 2020-05-23 – 2020-05-25 (×5): 4 mg via INTRAVENOUS
  Filled 2020-05-22 (×5): qty 2

## 2020-05-22 MED ORDER — PLASMA-LYTE 148 IV SOLN
INTRAVENOUS | Status: DC | PRN
  Administered 2020-05-22: 500 mL via INTRAVASCULAR

## 2020-05-22 MED ORDER — ACETAMINOPHEN 650 MG RE SUPP
650.0000 mg | Freq: Once | RECTAL | Status: AC
  Administered 2020-05-22: 650 mg via RECTAL

## 2020-05-22 MED ORDER — PHENYLEPHRINE 40 MCG/ML (10ML) SYRINGE FOR IV PUSH (FOR BLOOD PRESSURE SUPPORT)
PREFILLED_SYRINGE | INTRAVENOUS | Status: AC
  Filled 2020-05-22: qty 10

## 2020-05-22 MED ORDER — DEXTROSE 50 % IV SOLN: 0.0000 mL | INTRAVENOUS | Status: DC | PRN

## 2020-05-22 MED ORDER — EPINEPHRINE PF 1 MG/ML IJ SOLN
0.0000 ug/min | INTRAVENOUS | Status: DC
  Filled 2020-05-22: qty 4

## 2020-05-22 MED ORDER — ARTIFICIAL TEARS OPHTHALMIC OINT
TOPICAL_OINTMENT | OPHTHALMIC | Status: DC | PRN
  Administered 2020-05-22: 1 via OPHTHALMIC

## 2020-05-22 MED ORDER — OXYCODONE HCL 5 MG PO TABS: 5.0000 mg | ORAL_TABLET | ORAL | Status: DC | PRN

## 2020-05-22 MED ORDER — SODIUM CHLORIDE 0.9% FLUSH: 10.0000 mL | INTRAVENOUS | Status: DC | PRN

## 2020-05-22 MED ORDER — SODIUM CHLORIDE 0.9 % IV SOLN
INTRAVENOUS | Status: DC | PRN
  Administered 2020-05-22: 250 mL via INTRAVENOUS

## 2020-05-22 MED ORDER — ORAL CARE MOUTH RINSE
15.0000 mL | OROMUCOSAL | Status: DC
  Administered 2020-05-22 – 2020-05-23 (×8): 15 mL via OROMUCOSAL

## 2020-05-22 MED ORDER — CHLORHEXIDINE GLUCONATE 0.12% ORAL RINSE (MEDLINE KIT)
15.0000 mL | Freq: Two times a day (BID) | OROMUCOSAL | Status: DC
  Administered 2020-05-22 – 2020-05-23 (×2): 15 mL via OROMUCOSAL

## 2020-05-22 MED ORDER — FENTANYL CITRATE (PF) 250 MCG/5ML IJ SOLN
INTRAMUSCULAR | Status: AC
  Filled 2020-05-22: qty 25

## 2020-05-22 MED ORDER — FENTANYL CITRATE (PF) 250 MCG/5ML IJ SOLN
INTRAMUSCULAR | Status: AC
  Filled 2020-05-22: qty 5

## 2020-05-22 MED ORDER — MAGNESIUM SULFATE 4 GM/100ML IV SOLN: 4.0000 g | Freq: Once | INTRAVENOUS | Status: AC

## 2020-05-22 MED ORDER — ACETAMINOPHEN 500 MG PO TABS
1000.0000 mg | ORAL_TABLET | Freq: Four times a day (QID) | ORAL | Status: AC
  Administered 2020-05-23 – 2020-05-27 (×16): 1000 mg via ORAL
  Filled 2020-05-22 (×18): qty 2

## 2020-05-22 MED ORDER — MORPHINE SULFATE (PF) 2 MG/ML IV SOLN
1.0000 mg | INTRAVENOUS | Status: DC | PRN
  Filled 2020-05-22: qty 1

## 2020-05-22 MED ORDER — GLYCOPYRROLATE 0.2 MG/ML IJ SOLN
INTRAMUSCULAR | Status: DC | PRN
Start: 2020-05-22 — End: 2020-05-22
  Administered 2020-05-22: .2 mg via INTRAVENOUS

## 2020-05-22 MED ORDER — SODIUM BICARBONATE 8.4 % IV SOLN
50.0000 meq | Freq: Once | INTRAVENOUS | Status: AC
  Administered 2020-05-22: 50 meq via INTRAVENOUS

## 2020-05-22 MED ORDER — CHLORHEXIDINE GLUCONATE 0.12 % MT SOLN
OROMUCOSAL | Status: AC
  Administered 2020-05-22: 15 mL via OROMUCOSAL
  Filled 2020-05-22: qty 15

## 2020-05-22 MED ORDER — ASPIRIN 81 MG PO CHEW
324.0000 mg | CHEWABLE_TABLET | Freq: Every day | ORAL | Status: DC
  Filled 2020-05-22: qty 4

## 2020-05-22 MED ORDER — EPINEPHRINE HCL 5 MG/250ML IV SOLN IN NS: 0.0000 ug/min | INTRAVENOUS | Status: DC

## 2020-05-22 MED ORDER — FENTANYL CITRATE (PF) 250 MCG/5ML IJ SOLN
INTRAMUSCULAR | Status: DC | PRN
  Administered 2020-05-22 (×2): 100 ug via INTRAVENOUS
  Administered 2020-05-22: 25 ug via INTRAVENOUS
  Administered 2020-05-22: 50 ug via INTRAVENOUS
  Administered 2020-05-22: 150 ug via INTRAVENOUS
  Administered 2020-05-22 (×3): 100 ug via INTRAVENOUS
  Administered 2020-05-22: 150 ug via INTRAVENOUS
  Administered 2020-05-22: 175 ug via INTRAVENOUS
  Administered 2020-05-22: 100 ug via INTRAVENOUS
  Administered 2020-05-22: 200 ug via INTRAVENOUS

## 2020-05-22 MED ORDER — CLEVIDIPINE BUTYRATE 0.5 MG/ML IV EMUL
1.0000 mg/h | INTRAVENOUS | Status: DC
  Administered 2020-05-22: 2 mg/h via INTRAVENOUS
  Filled 2020-05-22: qty 100

## 2020-05-22 MED ORDER — LEVOFLOXACIN IN D5W 750 MG/150ML IV SOLN
750.0000 mg | INTRAVENOUS | Status: AC
  Administered 2020-05-23: 750 mg via INTRAVENOUS
  Filled 2020-05-22: qty 150

## 2020-05-22 MED ORDER — ACETAMINOPHEN 160 MG/5ML PO SOLN: 650.0000 mg | Freq: Once | ORAL | Status: AC

## 2020-05-22 MED ORDER — TRAMADOL HCL 50 MG PO TABS
50.0000 mg | ORAL_TABLET | ORAL | Status: DC | PRN
  Administered 2020-05-23 – 2020-05-25 (×5): 100 mg via ORAL
  Administered 2020-05-26 – 2020-05-28 (×4): 50 mg via ORAL
  Administered 2020-05-28 – 2020-05-31 (×4): 100 mg via ORAL
  Filled 2020-05-22: qty 1
  Filled 2020-05-22 (×2): qty 2
  Filled 2020-05-22 (×2): qty 1
  Filled 2020-05-22 (×4): qty 2
  Filled 2020-05-22: qty 1
  Filled 2020-05-22 (×3): qty 2

## 2020-05-22 MED ORDER — MILRINONE LACTATE IN DEXTROSE 20-5 MG/100ML-% IV SOLN: 0.3000 ug/kg/min | INTRAVENOUS | Status: DC

## 2020-05-22 MED ORDER — ACETAMINOPHEN 160 MG/5ML PO SOLN
1000.0000 mg | Freq: Four times a day (QID) | ORAL | Status: AC
  Administered 2020-05-23: 1000 mg
  Filled 2020-05-22: qty 40.6

## 2020-05-22 MED ORDER — LACTATED RINGERS IV SOLN: 500.0000 mL | Freq: Once | INTRAVENOUS | Status: DC | PRN

## 2020-05-22 MED ORDER — SODIUM CHLORIDE 0.9 % IV SOLN: INTRAVENOUS | Status: DC

## 2020-05-22 MED ORDER — FENTANYL CITRATE (PF) 100 MCG/2ML IJ SOLN
25.0000 ug | INTRAMUSCULAR | Status: DC | PRN
  Administered 2020-05-22 – 2020-05-23 (×2): 25 ug via INTRAVENOUS
  Administered 2020-05-23: 50 ug via INTRAVENOUS
  Administered 2020-05-23 – 2020-05-24 (×3): 25 ug via INTRAVENOUS
  Filled 2020-05-22 (×6): qty 2

## 2020-05-22 MED ORDER — METHYLPREDNISOLONE SODIUM SUCC 125 MG IJ SOLR
80.0000 mg | Freq: Once | INTRAMUSCULAR | Status: AC
  Administered 2020-05-22: 80 mg via INTRAVENOUS
  Filled 2020-05-22: qty 2

## 2020-05-22 MED ORDER — PROTAMINE SULFATE 10 MG/ML IV SOLN
INTRAVENOUS | Status: DC | PRN
  Administered 2020-05-22: 80 mg via INTRAVENOUS
  Administered 2020-05-22: 60 mg via INTRAVENOUS
  Administered 2020-05-22: 30 mg via INTRAVENOUS
  Administered 2020-05-22: 50 mg via INTRAVENOUS
  Administered 2020-05-22: 20 mg via INTRAVENOUS

## 2020-05-22 MED ORDER — CLEVIDIPINE 50 MG/100ML IV EMUL: 1.0000 mg/h | INTRAVENOUS | Status: DC

## 2020-05-22 MED ORDER — PANTOPRAZOLE SODIUM 40 MG PO TBEC
40.0000 mg | DELAYED_RELEASE_TABLET | Freq: Every day | ORAL | Status: DC
  Administered 2020-05-24 – 2020-06-01 (×9): 40 mg via ORAL
  Filled 2020-05-22 (×9): qty 1

## 2020-05-22 MED ORDER — PROPOFOL 10 MG/ML IV BOLUS
INTRAVENOUS | Status: DC | PRN
  Administered 2020-05-22: 60 mg via INTRAVENOUS
  Administered 2020-05-22: 80 mg via INTRAVENOUS
  Administered 2020-05-22 (×2): 30 mg via INTRAVENOUS

## 2020-05-22 MED ORDER — VANCOMYCIN HCL IN DEXTROSE 1-5 GM/200ML-% IV SOLN
750.0000 mg | Freq: Once | INTRAVENOUS | Status: DC
  Filled 2020-05-22: qty 200

## 2020-05-22 MED ORDER — CHLORHEXIDINE GLUCONATE CLOTH 2 % EX PADS
6.0000 | MEDICATED_PAD | Freq: Every day | CUTANEOUS | Status: DC
  Administered 2020-05-22 – 2020-05-29 (×7): 6 via TOPICAL

## 2020-05-22 MED ORDER — POTASSIUM CHLORIDE 10 MEQ/50ML IV SOLN: 10.0000 meq | INTRAVENOUS | Status: AC

## 2020-05-22 SURGICAL SUPPLY — 98 items
ADH SKN CLS APL DERMABOND .7 (GAUZE/BANDAGES/DRESSINGS) ×3
APL SKNCLS STERI-STRIP NONHPOA (GAUZE/BANDAGES/DRESSINGS) ×6
APL SWBSTK 6 STRL LF DISP (MISCELLANEOUS) ×3
APPLICATOR COTTON TIP 6 STRL (MISCELLANEOUS) IMPLANT
APPLICATOR COTTON TIP 6IN STRL (MISCELLANEOUS) ×5
BAG DECANTER FOR FLEXI CONT (MISCELLANEOUS) ×5 IMPLANT
BENZOIN TINCTURE PRP APPL 2/3 (GAUZE/BANDAGES/DRESSINGS) ×4 IMPLANT
BLADE CLIPPER SURG (BLADE) ×3 IMPLANT
BLADE STERNUM SYSTEM 6 (BLADE) ×5 IMPLANT
BLADE SURG SZ11 CARB STEEL (BLADE) ×2 IMPLANT
BNDG ELASTIC 4X5.8 VLCR STR LF (GAUZE/BANDAGES/DRESSINGS) ×5 IMPLANT
BNDG ELASTIC 6X5.8 VLCR STR LF (GAUZE/BANDAGES/DRESSINGS) ×5 IMPLANT
BNDG GAUZE ELAST 4 BULKY (GAUZE/BANDAGES/DRESSINGS) ×5 IMPLANT
CABLE SURGICAL S-101-97-12 (CABLE) ×5 IMPLANT
CANISTER SUCT 3000ML PPV (MISCELLANEOUS) ×5 IMPLANT
CANNULA EZ GLIDE 8.0 24FR (CANNULA) ×5 IMPLANT
CANNULA MC2 2 STG 29/37 NON-V (CANNULA) ×3 IMPLANT
CANNULA MC2 TWO STAGE (CANNULA) ×5
CANNULA NON VENT 20FR 12 (CANNULA) ×2 IMPLANT
CATH ROBINSON RED A/P 18FR (CATHETERS) ×10 IMPLANT
CLIP RETRACTION 3.0MM CORONARY (MISCELLANEOUS) ×3 IMPLANT
CLIP VESOCCLUDE MED 24/CT (CLIP) IMPLANT
CLIP VESOCCLUDE SM WIDE 24/CT (CLIP) IMPLANT
CONN ST 1/2X1/2  BEN (MISCELLANEOUS) ×5
CONN ST 1/2X1/2 BEN (MISCELLANEOUS) ×3 IMPLANT
CONNECTOR BLAKE 2:1 CARIO BLK (MISCELLANEOUS) ×5 IMPLANT
DEFOGGER ANTIFOG KIT (MISCELLANEOUS) ×2 IMPLANT
DERMABOND ADVANCED (GAUZE/BANDAGES/DRESSINGS) ×2
DERMABOND ADVANCED .7 DNX12 (GAUZE/BANDAGES/DRESSINGS) IMPLANT
DRAIN CHANNEL 19F RND (DRAIN) ×15 IMPLANT
DRAIN CONNECTOR BLAKE 1:1 (MISCELLANEOUS) ×5 IMPLANT
DRAIN WOUND SNY 15 RND (WOUND CARE) ×2 IMPLANT
DRAPE CARDIOVASCULAR INCISE (DRAPES) ×5
DRAPE INCISE IOBAN 66X45 STRL (DRAPES) IMPLANT
DRAPE SLUSH/WARMER DISC (DRAPES) ×5 IMPLANT
DRAPE SRG 135X102X78XABS (DRAPES) ×3 IMPLANT
DRSG COVADERM 4X14 (GAUZE/BANDAGES/DRESSINGS) ×3 IMPLANT
ELECT BLADE 4.0 EZ CLEAN MEGAD (MISCELLANEOUS) ×5
ELECT REM PT RETURN 9FT ADLT (ELECTROSURGICAL) ×10
ELECTRODE BLDE 4.0 EZ CLN MEGD (MISCELLANEOUS) IMPLANT
ELECTRODE REM PT RTRN 9FT ADLT (ELECTROSURGICAL) ×6 IMPLANT
EVACUATOR SILICONE 100CC (DRAIN) ×2 IMPLANT
FELT TEFLON 1X6 (MISCELLANEOUS) ×8 IMPLANT
GAUZE SPONGE 4X4 12PLY STRL (GAUZE/BANDAGES/DRESSINGS) ×10 IMPLANT
GLOVE BIO SURGEON STRL SZ 6.5 (GLOVE) ×1 IMPLANT
GLOVE BIO SURGEON STRL SZ7 (GLOVE) ×10 IMPLANT
GLOVE BIO SURGEONS STRL SZ 6.5 (GLOVE) ×1
GLOVE BIOGEL M STRL SZ7.5 (GLOVE) ×10 IMPLANT
GLOVE BIOGEL PI IND STRL 6.5 (GLOVE) IMPLANT
GLOVE BIOGEL PI IND STRL 7.0 (GLOVE) IMPLANT
GLOVE BIOGEL PI IND STRL 8.5 (GLOVE) IMPLANT
GLOVE BIOGEL PI INDICATOR 6.5 (GLOVE) ×8
GLOVE BIOGEL PI INDICATOR 7.0 (GLOVE) ×4
GLOVE BIOGEL PI INDICATOR 8.5 (GLOVE) ×2
GOWN STRL REUS W/ TWL LRG LVL3 (GOWN DISPOSABLE) ×12 IMPLANT
GOWN STRL REUS W/ TWL XL LVL3 (GOWN DISPOSABLE) ×6 IMPLANT
GOWN STRL REUS W/TWL LRG LVL3 (GOWN DISPOSABLE) ×30
GOWN STRL REUS W/TWL XL LVL3 (GOWN DISPOSABLE) ×10
HEMOSTAT POWDER SURGIFOAM 1G (HEMOSTASIS) ×15 IMPLANT
KIT BASIN OR (CUSTOM PROCEDURE TRAY) ×5 IMPLANT
KIT DRAINAGE VACCUM ASSIST (KITS) ×2 IMPLANT
KIT DRSG PREVENA PLUS 7DAY 125 (MISCELLANEOUS) ×2 IMPLANT
KIT PREVENA INCISION MGT20CM45 (CANNISTER) ×2 IMPLANT
KIT SUCTION CATH 14FR (SUCTIONS) ×5 IMPLANT
KIT TURNOVER KIT B (KITS) ×5 IMPLANT
KIT VASOVIEW HEMOPRO 2 VH 4000 (KITS) ×5 IMPLANT
LEAD PACING MYOCARDI (MISCELLANEOUS) ×3 IMPLANT
MARKER GRAFT CORONARY BYPASS (MISCELLANEOUS) ×15 IMPLANT
NS IRRIG 1000ML POUR BTL (IV SOLUTION) ×25 IMPLANT
PACK ACCESSORY CANNULA KIT (KITS) ×5 IMPLANT
PACK E OPEN HEART (SUTURE) ×5 IMPLANT
PACK OPEN HEART (CUSTOM PROCEDURE TRAY) ×5 IMPLANT
PAD ARMBOARD 7.5X6 YLW CONV (MISCELLANEOUS) ×10 IMPLANT
PAD ELECT DEFIB RADIOL ZOLL (MISCELLANEOUS) ×5 IMPLANT
PENCIL BUTTON HOLSTER BLD 10FT (ELECTRODE) ×5 IMPLANT
POSITIONER HEAD DONUT 9IN (MISCELLANEOUS) ×5 IMPLANT
PUNCH AORTIC ROTATE 4.0MM (MISCELLANEOUS) ×5 IMPLANT
SET CARDIOPLEGIA MPS 5001102 (MISCELLANEOUS) ×2 IMPLANT
SUPPORT HEART JANKE-BARRON (MISCELLANEOUS) ×5 IMPLANT
SUT BONE WAX W31G (SUTURE) ×5 IMPLANT
SUT ETHIBOND X763 2 0 SH 1 (SUTURE) ×10 IMPLANT
SUT MNCRL AB 3-0 PS2 18 (SUTURE) ×10 IMPLANT
SUT MNCRL AB 4-0 PS2 18 (SUTURE) ×4 IMPLANT
SUT PDS AB 1 CTX 36 (SUTURE) ×10 IMPLANT
SUT PROLENE 4 0 SH DA (SUTURE) ×5 IMPLANT
SUT PROLENE 5 0 C 1 36 (SUTURE) ×15 IMPLANT
SUT PROLENE 7 0 BV1 MDA (SUTURE) ×7 IMPLANT
SUT STEEL 6MS V (SUTURE) ×10 IMPLANT
SUT VIC AB 2-0 CT1 27 (SUTURE) ×15
SUT VIC AB 2-0 CT1 TAPERPNT 27 (SUTURE) IMPLANT
SYSTEM SAHARA CHEST DRAIN ATS (WOUND CARE) ×5 IMPLANT
TAPE CLOTH SOFT 2X10 (GAUZE/BANDAGES/DRESSINGS) ×2 IMPLANT
TOWEL GREEN STERILE (TOWEL DISPOSABLE) ×5 IMPLANT
TOWEL GREEN STERILE FF (TOWEL DISPOSABLE) ×5 IMPLANT
TRAY FOLEY SLVR 16FR TEMP STAT (SET/KITS/TRAYS/PACK) ×5 IMPLANT
TUBING LAP HI FLOW INSUFFLATIO (TUBING) ×5 IMPLANT
UNDERPAD 30X36 HEAVY ABSORB (UNDERPADS AND DIAPERS) ×5 IMPLANT
WATER STERILE IRR 1000ML POUR (IV SOLUTION) ×10 IMPLANT

## 2020-05-22 NOTE — Anesthesia Procedure Notes (Signed)
Central Venous Catheter Insertion Performed by: Cecile Hearing, MD, anesthesiologist Start/End6/07/2020 6:48 AM, 05/22/2020 6:58 AM Patient location: Pre-op. Preanesthetic checklist: patient identified, IV checked, site marked, risks and benefits discussed, surgical consent, monitors and equipment checked, pre-op evaluation, timeout performed and anesthesia consent Position: Trendelenburg Lidocaine 1% used for infiltration and patient sedated Hand hygiene performed , maximum sterile barriers used  and Seldinger technique used Catheter size: 8.5 Fr Central line was placed.Sheath introducer Procedure performed using ultrasound guided technique. Ultrasound Notes:anatomy identified, needle tip was noted to be adjacent to the nerve/plexus identified, no ultrasound evidence of intravascular and/or intraneural injection and image(s) printed for medical record Attempts: 1 Following insertion, line sutured, dressing applied and Biopatch. Post procedure assessment: free fluid flow, blood return through all ports and no air  Patient tolerated the procedure well with no immediate complications.

## 2020-05-22 NOTE — Progress Notes (Signed)
     [address]       [place] [address]             [phone]       No events overnight  Vitals:   05/22/20 0435 05/22/20 0454  BP: 102/68   Pulse:  60  Resp:    Temp: 98 F (36.7 C)   SpO2:     Alert NAD Sinus EWOB  OR today for CABG 4  [name] [name]

## 2020-05-22 NOTE — Progress Notes (Signed)
The patient has been taken to the OR by Dr. Cliffton Asters this morning for CABG 4V. Will sign off for now.

## 2020-05-22 NOTE — Op Note (Signed)
[address]       ,[place] [address]             [phone]                                          05/22/2020 Patient:  [name] [name] Pre-Op Dx: Unstable angina   3V CAD   DM   Chronic renal insuffiency   Post-op Dx:  same Procedure: CABG X 3.  LIMA LAD, RSVG distal RCA, OM1   open greater saphenous vein harvest on the right Intra-operative Transesophageal Echocardiogram  Surgeon and Role:      * Zyheir Daft, Lucile Crater, MD - Primary    * E. Barrett, PA-C - assisting Assistant: Macarthur Critchley, PA-C  Anesthesia  general EBL:  547ml Blood Administration: 2 units pRBCs Xclamp Time:  46 min Pump Time:  44min  Drains: 19 F blake drain: R, L, mediastinal  Wires: none Counts: correct   Indications: The patient is a 79 yr old man woman who presented to [facility] ED with complaints of chest pain on 05/17/2020. She has a past medical history of HTN, HLD, Stage 3bCKD(s/p L nephrectomy in 9892'J complicated by abdominal wall hernia)and Type 2 DMadmitted on 05/17/2020 with atypical chest pain, stress test is high risk. The patient was transferred to [facility] on 05/19/2020.   LHC was performed this morning that demonstrates critical 3 vessel disease including 95% occlusion of proximal LAD, 100% Lt Circumflex , M1 & M2 with collaterals, and Mid-RCA with collaterals. TCTS has been consulted to assist with management  Findings: Good LAD target.  Intramyocardial.  Good distal RCA, and OM target.  Good LIMA, good vein conduit  Operative Technique: All invasive lines were placed in pre-op holding.  After the risks, benefits and alternatives were thoroughly discussed, the patient was brought to the operative theatre.  Anesthesia was induced, and the patient was prepped and draped in normal sterile fashion.  An appropriate surgical pause was performed, and pre-operative antibiotics were dosed accordingly.  We began with simultaneous incisions were made along the right leg  for harvesting of the greater saphenous vein and the chest for the sternotomy.  In regards to the sternotomy, this was carried down with bovie cautery, and the sternum was divided with a reciprocating saw.  Meticulous hemostasis was obtained.  The left internal thoracic artery was exposed and harvested in in pedicled fashion.  The patient was systemically heparinized, and the artery was divided distally, and placed in a papaverine sponge.    The sternal elevator was removed, and a retractor was placed.  The pericardium was divided in the midline and fashioned into a cradle with pericardial stitches.   After we confirmed an appropriate ACT, the ascending aorta was cannulated in standard fashion.  The right atrial appendage was used for venous cannulation site.  Cardiopulmonary bypass was initiated, and the heart retractor was placed. The cross clamp was applied, and a dose of anterograde cardioplegia was given with good arrest of the heart.  We moved to the posterior wall of the heart, and found a good target on the distal RCA.  An arteriotomy was made, and the vein graft was anastomosed to it in an end to side fashion.  Next we exposed the lateral wall, and found a good target on the OM1.  An end to side  anastomosis with the vein graft was then created.  Finally, we exposed a good target on the LAD, and fashioned an end to side anastomosis between it and the LITA.  We began to re-warm, and a re-animation dose of cardioplegia was given.  The heart was de-aired, and the cross clamp was removed.  Meticulous hemostasis was obtained.    A partial occludding clamp was then placed on the ascending aorta, and we created an end to side anastomosis between it and the proximal vein grafts.  The proximal sites were marked with rings.  Hemostasis was obtained, and we separated from cardiopulmonary bypass without event.the heparin was reversed with protamine.  Chest tubes and wires were placed, and the sternum was  re-approximated with with sternal wires.  The soft tissue and skin were re-approximated wth absorbable suture.    The patient tolerated the procedure without any immediate complications, and was transferred to the ICU in guarded condition.  [name] [name]

## 2020-05-22 NOTE — Progress Notes (Signed)
  Echocardiogram 2D Echocardiogram has been performed.  Celene Skeen 05/22/2020, 9:09 AM

## 2020-05-22 NOTE — Anesthesia Procedure Notes (Signed)
Procedure Name: Intubation Date/Time: 05/22/2020 7:55 AM Performed by: Julian Reil, CRNA Pre-anesthesia Checklist: Patient identified, Emergency Drugs available, Suction available and Patient being monitored Patient Re-evaluated:Patient Re-evaluated prior to induction Oxygen Delivery Method: Circle system utilized Preoxygenation: Pre-oxygenation with 100% oxygen Induction Type: IV induction Ventilation: Mask ventilation without difficulty Laryngoscope Size: Miller and 3 Grade View: Grade I Tube type: Oral Tube size: 8.0 mm Number of attempts: 1 Airway Equipment and Method: Stylet and Oral airway Placement Confirmation: ETT inserted through vocal cords under direct vision,  positive ETCO2 and breath sounds checked- equal and bilateral Secured at: 22 cm Tube secured with: Tape Dental Injury: Teeth and Oropharynx as per pre-operative assessment  Comments: SRNA Thad Ranger intubated

## 2020-05-22 NOTE — Anesthesia Procedure Notes (Addendum)
Arterial Line Insertion Start/End6/07/2020 6:40 AM, 05/22/2020 6:45 AM Performed by: Julian Reil, CRNA, CRNA  Patient location: Pre-op. Preanesthetic checklist: patient identified, IV checked, risks and benefits discussed, surgical consent, monitors and equipment checked, pre-op evaluation and timeout performed Lidocaine 1% used for infiltration and patient sedated Left, radial was placed Catheter size: 20 G Hand hygiene performed  and maximum sterile barriers used   Attempts: 2 Procedure performed without using ultrasound guided technique. Following insertion, dressing applied and Biopatch. Post procedure assessment: normal  Patient tolerated the procedure well with no immediate complications. Additional procedure comments: Art line placement per Thad Ranger, SRNA with my supervision. P Farren Nelles CRNA.

## 2020-05-22 NOTE — Hospital Course (Signed)
Mrs. Pizzimenti is a 79 yo female with history of DM, HTN, HLD, Stage 3 bCKD (s/p nephrectomy with abdominal wall hernia, who presented to [facility] with complaints of chest pain on 05/17/2020.  She stated her chest pain had been ongoing over the past 6 months. She described her pain as a heaviness in her chest that radiates from the mid region to the left side and occasionally into the right arm.  The episodes occur at rest and with activity.  There is also associated shortness of breath.  The episodes have increase in intensity as of left.  Workup in the ED showed EKG with non-specific EKG changes.  Her troponin levels were negative.  She was admitted for ACE rule out.  Cardiology consult was requested who recommended patient undergo stress testing.  This was felt to be high risk and it was felt cardiac catheterization should be performed.  This would require transfer to [facility] Course:    On arrival to [facility] the patient was chest pain free.  She was taken to the catheterization lab on 05/21/2020.  This showed multivessel CAD that was felt to be critical and would require coronary bypass grafting.  TCTS consult was obtained and the patient was evaluated by Dr. Cliffton Asters who was in agreement bypass surgery would be the patient's best treatment option.  The risks and benefits of the procedure were explained to the patient and she was agreeable to proceed.  She was taken to the operating room on 05/22/2020.  She underwent CABG x 3 utilizing LIMA to LAD, SVG to OM1, and SVG to distal RCA.  She also underwent open harvest of greater saphenous vein from her right leg.  She tolerated the procedure without difficulty and was taken to the SICU in stable condition.

## 2020-05-22 NOTE — Anesthesia Postprocedure Evaluation (Signed)
Anesthesia Post Note  Patient: [name] [name]  Procedure(s) Performed: CORONARY ARTERY BYPASS GRAFTING (CABG) x3 LIMA TO LAD, SVG TO OM1, SVG TO DISTAL RIGHT (N/A Chest) TRANSESOPHAGEAL ECHOCARDIOGRAM (TEE) (N/A ) Open Vein Harvest (Right )     Patient location during evaluation: PACU Anesthesia Type: General Level of consciousness: awake and alert Pain management: pain level controlled Vital Signs Assessment: post-procedure vital signs reviewed and stable Respiratory status: spontaneous breathing, nonlabored ventilation, respiratory function stable and patient connected to nasal cannula oxygen Cardiovascular status: blood pressure returned to baseline and stable Postop Assessment: no apparent nausea or vomiting Anesthetic complications: no    Last Vitals:  Vitals:   05/22/20 1308 05/22/20 1404  BP:    Pulse:    Resp:    Temp:    SpO2: 100% 100%    Last Pain:  Vitals:   05/22/20 0435  TempSrc: Oral  PainSc:                  Cecile Hearing

## 2020-05-22 NOTE — Transfer of Care (Signed)
Immediate Anesthesia Transfer of Care Note  Patient: NALANY STEEDLEY  Procedure(s) Performed: CORONARY ARTERY BYPASS GRAFTING (CABG) x3 LIMA TO LAD, SVG TO OM1, SVG TO DISTAL RIGHT (N/A Chest) TRANSESOPHAGEAL ECHOCARDIOGRAM (TEE) (N/A ) Open Vein Harvest (Right )  Patient Location: ICU  Anesthesia Type:General  Level of Consciousness: sedated and Patient remains intubated per anesthesia plan  Airway & Oxygen Therapy: Patient remains intubated per anesthesia plan and Patient placed on Ventilator (see vital sign flow sheet for setting)  Post-op Assessment: Report given to RN and Post -op Vital signs reviewed and stable  Post vital signs: Reviewed and stable  Last Vitals:  Vitals Value Taken Time  BP 94/47 05/22/20 1305  Temp    Pulse 62 05/22/20 1309  Resp 12 05/22/20 1309  SpO2 100 % 05/22/20 1309  Vitals shown include unvalidated device data.  Last Pain:  Vitals:   05/22/20 0435  TempSrc: Oral  PainSc:       Patients Stated Pain Goal: 0 (05/21/20 2312)  Complications: No apparent anesthesia complications

## 2020-05-22 NOTE — Progress Notes (Signed)
CT surgery p.m. Rounds  Patient stable after multivessel CABG She still remains heavily sedated not ready for extubation with periods of apnea.  ABGs on ventilator are satisfactory Heart rate 54 with some pauses.  We will start low-dose dobutamine for chronotropic effect and hold beta-blocker.

## 2020-05-22 NOTE — Progress Notes (Signed)
ANTICOAGULATION CONSULT NOTE   Pharmacy Consult for:  heparin dosing Indication: 3V CAD  Allergies  Allergen Reactions  . Contrast Media [Iodinated Diagnostic Agents]     Unknown reaction  . Morphine And Related   . Other     Pt has multiple allergies per pt but unsure of all.   . Penicillins     Unknown reaction  . Codeine Rash    Patient Measurements: Height: 4\' 11"  (149.9 cm) Weight: 70 kg (154 lb 5.2 oz) IBW/kg (Calculated) : 43.2 Heparin Dosing Weight:  HEPARIN DW (KG): 59 HEPARIN DW (KG): 59  Vital Signs: Temp: 98 F (36.7 C) (06/08 0435) Temp Source: Oral (06/08 0435) BP: 102/68 (06/08 0435) Pulse Rate: 60 (06/08 0454)  Labs: Recent Labs    05/20/20 0229 05/20/20 0229 05/21/20 0436 05/21/20 0731 05/21/20 2036 05/22/20 0235 05/22/20 0450  HGB 11.9*   < > 11.4*  --   --  10.8*  --   HCT 35.7*  --  34.0*  --   --  32.4*  --   PLT 268  --  262  --   --  251  --   APTT  --   --   --   --  26  --   --   LABPROT  --   --   --   --  13.2  --   --   INR  --   --   --   --  1.0  --   --   HEPARINUNFRC 0.65  --  0.89*  --   --   --  0.41  CREATININE 1.26*   < > 1.64* 1.53*  --  1.49*  --    < > = values in this interval not displayed.   Estimated Creatinine Clearance: 26.5 mL/min (A) (by C-G formula based on SCr of 1.49 mg/dL (H)).    Assessment: 79 y.o. female presenting from [facility] to  for LHC due to atypical chest pain. NO AC PTA. Pharmacy consulted to dose heparin.  Heparin level up to supratherapeutic (0.89) on gtt at 950 units/hr. Small amount of blood when wiped rectum this morning - likely from hemorrhoid. Cath today demonstrated 3vCAD, CABG consult ordered, pharmacy to restart heparin 8hr after sheath removal.  Heparin level therapeutic (0.41) on gtt at 800 units/hr. No bleeding noted. Pt scheduled for CABG today.  Goal of Therapy:  Heparin level 0.3-0.7 units/ml Monitor platelets by anticoagulation protocol: Yes   Plan:  -F/u post  CABG  70, PharmD, BCPS Please see amion for complete clinical pharmacist phone list 05/22/2020

## 2020-05-22 NOTE — Progress Notes (Signed)
RT note- Parameters performed for extubation, NIF-22, FVC , best effort, remains on wean, ABG WNL.

## 2020-05-22 NOTE — Brief Op Note (Signed)
05/17/2020 - 05/22/2020  4:10 PM  PATIENT:  [name] [name]  79 y.o. female  PRE-OPERATIVE DIAGNOSIS:  CAD  POST-OPERATIVE DIAGNOSIS:  CAD  PROCEDURE:  Procedure(s):  CORONARY ARTERY BYPASS GRAFTING x3  -LIMA TO LAD - SVG TO OM1 -SVG TO DISTAL RIGHT (N/A)  OPEN ENDOSCOPIC HARVEST GREATER SAPHENOUS VEIN -Right leg  TRANSESOPHAGEAL ECHOCARDIOGRAM (TEE) (N/A)   SURGEON:  Surgeon(s) and Role:    * Lightfoot, Eliezer Lofts, MD - Primary  PHYSICIAN ASSISTANT: Nuh Lipton PA-C, Jillyn Hidden PA-C  ASSISTANTS: Bebe Shaggy RNFA   ANESTHESIA:   general  EBL:  850 mL   BLOOD ADMINISTERED: PRBC and  CELLSAVER  DRAINS:  Left Pleural Chest Tube, Mediastinal chest tubes, JP Drain R legh    LOCAL MEDICATIONS USED:  NONE  SPECIMEN:  No Specimen  DISPOSITION OF SPECIMEN:  N/A  COUNTS:  YES  TOURNIQUET:  * No tourniquets in log *  DICTATION: .Dragon Dictation  PLAN OF CARE: Admit to inpatient   PATIENT DISPOSITION:  ICU - intubated and hemodynamically stable.   Delay start of Pharmacological VTE agent (>24hrs) due to surgical blood loss or risk of bleeding: yes

## 2020-05-22 NOTE — Progress Notes (Signed)
RT note-Dr VanTrigt rounding on patient, ABG and parameters shown, placed back to full support, continue to monitor for weaning as tolerated.

## 2020-05-23 ENCOUNTER — Inpatient Hospital Stay (HOSPITAL_COMMUNITY): Payer: Medicare Other

## 2020-05-23 LAB — POCT I-STAT 7, (LYTES, BLD GAS, ICA,H+H)
Acid-base deficit: 3 mmol/L — ABNORMAL HIGH (ref 0.0–2.0)
Acid-base deficit: 1 mmol/L (ref 0.0–2.0)
Acid-base deficit: 2 mmol/L (ref 0.0–2.0)
Acid-base deficit: 2 mmol/L (ref 0.0–2.0)
Bicarbonate: 21.9 mmol/L (ref 20.0–28.0)
Bicarbonate: 24.3 mmol/L (ref 20.0–28.0)
Bicarbonate: 22.5 mmol/L (ref 20.0–28.0)
Bicarbonate: 21.9 mmol/L (ref 20.0–28.0)
Calcium, Ion: 1.17 mmol/L (ref 1.15–1.40)
Calcium, Ion: 1.2 mmol/L (ref 1.15–1.40)
Calcium, Ion: 1.19 mmol/L (ref 1.15–1.40)
Calcium, Ion: 1.19 mmol/L (ref 1.15–1.40)
HCT: 30 % — ABNORMAL LOW (ref 36.0–46.0)
HCT: 32 % — ABNORMAL LOW (ref 36.0–46.0)
HCT: 29 % — ABNORMAL LOW (ref 36.0–46.0)
HCT: 29 % — ABNORMAL LOW (ref 36.0–46.0)
Hemoglobin: 10.2 g/dL — ABNORMAL LOW (ref 12.0–15.0)
Hemoglobin: 10.9 g/dL — ABNORMAL LOW (ref 12.0–15.0)
Hemoglobin: 9.9 g/dL — ABNORMAL LOW (ref 12.0–15.0)
Hemoglobin: 9.9 g/dL — ABNORMAL LOW (ref 12.0–15.0)
O2 Saturation: 98 %
O2 Saturation: 98 %
O2 Saturation: 98 %
O2 Saturation: 97 %
Patient temperature: 37.4
Patient temperature: 37.1
Patient temperature: 37
Patient temperature: 37.1
Potassium: 4.7 mmol/L (ref 3.5–5.1)
Potassium: 4.2 mmol/L (ref 3.5–5.1)
Potassium: 4.2 mmol/L (ref 3.5–5.1)
Potassium: 4.2 mmol/L (ref 3.5–5.1)
Sodium: 143 mmol/L (ref 135–145)
Sodium: 141 mmol/L (ref 135–145)
Sodium: 142 mmol/L (ref 135–145)
Sodium: 142 mmol/L (ref 135–145)
TCO2: 23 mmol/L (ref 22–32)
TCO2: 26 mmol/L (ref 22–32)
TCO2: 24 mmol/L (ref 22–32)
TCO2: 23 mmol/L (ref 22–32)
pCO2 arterial: 40.1 mmHg (ref 32.0–48.0)
pCO2 arterial: 41 mmHg (ref 32.0–48.0)
pCO2 arterial: 37.9 mmHg (ref 32.0–48.0)
pCO2 arterial: 35.4 mmHg (ref 32.0–48.0)
pH, Arterial: 7.347 — ABNORMAL LOW (ref 7.350–7.450)
pH, Arterial: 7.382 (ref 7.350–7.450)
pH, Arterial: 7.381 (ref 7.350–7.450)
pH, Arterial: 7.399 (ref 7.350–7.450)
pO2, Arterial: 112 mmHg — ABNORMAL HIGH (ref 83.0–108.0)
pO2, Arterial: 106 mmHg (ref 83.0–108.0)
pO2, Arterial: 114 mmHg — ABNORMAL HIGH (ref 83.0–108.0)
pO2, Arterial: 92 mmHg (ref 83.0–108.0)

## 2020-05-23 LAB — GLUCOSE, CAPILLARY
Glucose-Capillary: 121 mg/dL — ABNORMAL HIGH (ref 70–99)
Glucose-Capillary: 125 mg/dL — ABNORMAL HIGH (ref 70–99)
Glucose-Capillary: 137 mg/dL — ABNORMAL HIGH (ref 70–99)
Glucose-Capillary: 141 mg/dL — ABNORMAL HIGH (ref 70–99)
Glucose-Capillary: 148 mg/dL — ABNORMAL HIGH (ref 70–99)
Glucose-Capillary: 155 mg/dL — ABNORMAL HIGH (ref 70–99)
Glucose-Capillary: 161 mg/dL — ABNORMAL HIGH (ref 70–99)
Glucose-Capillary: 164 mg/dL — ABNORMAL HIGH (ref 70–99)
Glucose-Capillary: 164 mg/dL — ABNORMAL HIGH (ref 70–99)
Glucose-Capillary: 165 mg/dL — ABNORMAL HIGH (ref 70–99)
Glucose-Capillary: 169 mg/dL — ABNORMAL HIGH (ref 70–99)
Glucose-Capillary: 169 mg/dL — ABNORMAL HIGH (ref 70–99)
Glucose-Capillary: 178 mg/dL — ABNORMAL HIGH (ref 70–99)
Glucose-Capillary: 200 mg/dL — ABNORMAL HIGH (ref 70–99)
Glucose-Capillary: 211 mg/dL — ABNORMAL HIGH (ref 70–99)
Glucose-Capillary: 99 mg/dL (ref 70–99)

## 2020-05-23 LAB — BASIC METABOLIC PANEL
Anion gap: 9 (ref 5–15)
Anion gap: 9 (ref 5–15)
BUN: 19 mg/dL (ref 8–23)
BUN: 19 mg/dL (ref 8–23)
CO2: 21 mmol/L — ABNORMAL LOW (ref 22–32)
CO2: 22 mmol/L (ref 22–32)
Calcium: 8.2 mg/dL — ABNORMAL LOW (ref 8.9–10.3)
Calcium: 8.1 mg/dL — ABNORMAL LOW (ref 8.9–10.3)
Chloride: 109 mmol/L (ref 98–111)
Chloride: 106 mmol/L (ref 98–111)
Creatinine, Ser: 1.27 mg/dL — ABNORMAL HIGH (ref 0.44–1.00)
Creatinine, Ser: 1.32 mg/dL — ABNORMAL HIGH (ref 0.44–1.00)
GFR calc Af Amer: 47 mL/min — ABNORMAL LOW (ref >60)
GFR calc Af Amer: 45 mL/min — ABNORMAL LOW (ref >60)
GFR calc non Af Amer: 40 mL/min — ABNORMAL LOW (ref >60)
GFR calc non Af Amer: 39 mL/min — ABNORMAL LOW (ref >60)
Glucose, Bld: 153 mg/dL — ABNORMAL HIGH (ref 70–99)
Glucose, Bld: 138 mg/dL — ABNORMAL HIGH (ref 70–99)
Potassium: 4.1 mmol/L (ref 3.5–5.1)
Potassium: 4.5 mmol/L (ref 3.5–5.1)
Sodium: 139 mmol/L (ref 135–145)
Sodium: 137 mmol/L (ref 135–145)

## 2020-05-23 LAB — CBC
HCT: 31.5 % — ABNORMAL LOW (ref 36.0–46.0)
HCT: 28.8 % — ABNORMAL LOW (ref 36.0–46.0)
Hemoglobin: 10.6 g/dL — ABNORMAL LOW (ref 12.0–15.0)
Hemoglobin: 9.6 g/dL — ABNORMAL LOW (ref 12.0–15.0)
MCH: 30.8 pg (ref 26.0–34.0)
MCH: 31.1 pg (ref 26.0–34.0)
MCHC: 33.7 g/dL (ref 30.0–36.0)
MCHC: 33.3 g/dL (ref 30.0–36.0)
MCV: 91.6 fL (ref 80.0–100.0)
MCV: 93.2 fL (ref 80.0–100.0)
Platelets: 140 10*3/uL — ABNORMAL LOW (ref 150–400)
Platelets: 129 10*3/uL — ABNORMAL LOW (ref 150–400)
RBC: 3.44 MIL/uL — ABNORMAL LOW (ref 3.87–5.11)
RBC: 3.09 MIL/uL — ABNORMAL LOW (ref 3.87–5.11)
RDW: 13.2 % (ref 11.5–15.5)
RDW: 13.6 % (ref 11.5–15.5)
WBC: 13.4 10*3/uL — ABNORMAL HIGH (ref 4.0–10.5)
WBC: 16.9 10*3/uL — ABNORMAL HIGH (ref 4.0–10.5)
nRBC: 0 % (ref 0.0–0.2)
nRBC: 0 % (ref 0.0–0.2)

## 2020-05-23 LAB — MAGNESIUM
Magnesium: 2.4 mg/dL (ref 1.7–2.4)
Magnesium: 2.2 mg/dL (ref 1.7–2.4)

## 2020-05-23 MED ORDER — INSULIN DETEMIR 100 UNIT/ML ~~LOC~~ SOLN
10.0000 [IU] | Freq: Every day | SUBCUTANEOUS | Status: DC
  Administered 2020-05-24 – 2020-05-25 (×2): 10 [IU] via SUBCUTANEOUS
  Filled 2020-05-23 (×3): qty 0.1

## 2020-05-23 MED ORDER — INSULIN ASPART 100 UNIT/ML ~~LOC~~ SOLN
0.0000 [IU] | SUBCUTANEOUS | Status: DC
  Administered 2020-05-23: 4 [IU] via SUBCUTANEOUS
  Administered 2020-05-24: 2 [IU] via SUBCUTANEOUS
  Administered 2020-05-24 (×2): 8 [IU] via SUBCUTANEOUS
  Administered 2020-05-24: 12 [IU] via SUBCUTANEOUS
  Administered 2020-05-24: 4 [IU] via SUBCUTANEOUS
  Administered 2020-05-24: 8 [IU] via SUBCUTANEOUS
  Administered 2020-05-25: 4 [IU] via SUBCUTANEOUS
  Administered 2020-05-25 (×2): 2 [IU] via SUBCUTANEOUS
  Administered 2020-05-25: 4 [IU] via SUBCUTANEOUS
  Administered 2020-05-25: 8 [IU] via SUBCUTANEOUS
  Administered 2020-05-25: 2 [IU] via SUBCUTANEOUS
  Administered 2020-05-26: 4 [IU] via SUBCUTANEOUS
  Administered 2020-05-26: 2 [IU] via SUBCUTANEOUS
  Administered 2020-05-26: 4 [IU] via SUBCUTANEOUS
  Administered 2020-05-26: 2 [IU] via SUBCUTANEOUS
  Administered 2020-05-26: 12 [IU] via SUBCUTANEOUS

## 2020-05-23 MED ORDER — FUROSEMIDE 10 MG/ML IJ SOLN
20.0000 mg | Freq: Once | INTRAMUSCULAR | Status: AC
  Administered 2020-05-23: 20 mg via INTRAVENOUS
  Filled 2020-05-23: qty 2

## 2020-05-23 MED ORDER — ORAL CARE MOUTH RINSE
15.0000 mL | Freq: Two times a day (BID) | OROMUCOSAL | Status: DC
  Administered 2020-05-23 – 2020-05-31 (×17): 15 mL via OROMUCOSAL

## 2020-05-23 MED ORDER — ENOXAPARIN SODIUM 30 MG/0.3ML ~~LOC~~ SOLN
30.0000 mg | Freq: Every day | SUBCUTANEOUS | Status: DC
  Administered 2020-05-23 – 2020-05-31 (×9): 30 mg via SUBCUTANEOUS
  Filled 2020-05-23 (×9): qty 0.3

## 2020-05-23 MED ORDER — INSULIN DETEMIR 100 UNIT/ML ~~LOC~~ SOLN
10.0000 [IU] | Freq: Once | SUBCUTANEOUS | Status: AC
  Administered 2020-05-23: 10 [IU] via SUBCUTANEOUS
  Filled 2020-05-23: qty 0.1

## 2020-05-23 NOTE — Procedures (Signed)
Extubation Procedure Note  Patient Details:   Name: [name] [name] DOB: 01-31-1941 MRN: [id number]   Airway Documentation:    Vent end date: 05/23/20 Vent end time: 0851   Evaluation  O2 sats: stable throughout Complications: No apparent complications Patient did tolerate procedure well. Bilateral Breath Sounds: Diminished  Patient able to talk: Yes No cuff leak found, per Dr Cliffton Asters extubate. No noted stridor at this time.  Peggye Form 05/23/2020, 8:52 AM

## 2020-05-23 NOTE — Progress Notes (Signed)
Patient weaned on vent for extubation, cuff leak still not auscultated. MD made aware. Will keep on vent throughout night. See new orders.

## 2020-05-23 NOTE — Discharge Summary (Addendum)
Physician Discharge Summary       [address]       [name] [address]             [phone]    Patient ID: [name] [name] MRN: [id number] DOB/AGE: [birth year] 79 y.o.  Admit date: 05/17/2020 Discharge date: 06/01/2020  Admission Diagnoses: 1. Abnormal cardiovascular stress test 2. Coronary artery disease  Discharge Diagnoses:  1. S/p CABG x 3 2. Expected post op blood loss anemia 3. History of Stage 3 bCKD (s/p L nephrectomy in 1970's complicated by abdominal wall hernia) 4. History of Diabetes mellitus (HCC) 5. History of hyperlipidemia 6. History of hypertension 7. History of Chronic obstructive pulmonary disease, unspecified (HCC) 8. History of Major depressive disorder, single episode, unspecified   Consults: None  Procedure (s):  CABG X 3.  LIMA LAD, RSVG distal RCA, OM1   open greater saphenous vein harvest on the right Intra-operative Transesophageal Echocardiogram by Dr. Cliffton Asters on 05/22/2020.  History of Presenting Illness: The patient is a 79 yr old man woman who presented to [facility] ED with complaints of chest pain on 05/17/2020. She has a past medical history of HTN, HLD, Stage 3 bCKD (s/p L nephrectomy in 1970's complicated by abdominal wall hernia) and Type 2 DM admitted on 05/17/2020 with atypical chest pain, stress test is high risk. The patient was transferred to [facility] on 05/19/2020.      LHC was performed this morning that demonstrates critical 3 vessel disease including 95% occlusion of proximal LAD, 100% Lt Circumflex , M1 & M2 with collaterals, and Mid-RCA with collaterals. TCTS has been consulted to assist with management.  She is currently chest pain free.   Brief Hospital Course:  On arrival to [facility] the patient was chest pain free.  She was taken to the catheterization lab on 05/21/2020.  This showed multivessel CAD that was felt to be critical and would require coronary bypass grafting.  TCTS consult was obtained and  the patient was evaluated by Dr. Cliffton Asters who was in agreement bypass surgery would be the patient's best treatment option.  The risks and benefits of the procedure were explained to the patient and she was agreeable to proceed.  She was taken to the operating room on 05/22/2020.  She underwent CABG x 3 utilizing LIMA to LAD, SVG to OM1, and SVG to distal RCA.  She also underwent open harvest of greater saphenous vein from her right leg.  She tolerated the procedure without difficulty and was taken to the SICU in stable condition.    The patient failed initial extubation. She was successfully extubated later the am of 06/09. She remained afebrile and hemodynamically stable.  She was weaned off Dobutamine and Levophed drips. Theone Murdoch, a line, chest tubes, and foley were removed early in the post operative course. She had transient bradycardia, junctional bradycardia initially post op. She was volume over loaded and diuresed. She had ABL anemia. She did not require a post op transfusion. Last H and H was 10.4 and 32.6. She was weaned off the insulin drip.  Once she was tolerating a diet, Janumet was restarted  The patient's glucose remained well controlled. The patient's HGA1C pre op was  7.4. She did have mildly elevated creatinine post op. Her creatinine was 1.4 upon admission and last was 1.41 on 06/16. She has not been on Lasix for several days or ACE. The patient was felt surgically stable for transfer from the ICU to [facility]  for further convalescence on 06/10 .She continues to progress with cardiac rehab. She was still requiring 2 liters of oxygen via [place];however, on 06/14 she was oxygenating well on room air and with ambulation and will NOT need oxygen at discharge. She has been tolerating a diet and has had a bowel movement. The patient is felt surgically stable for discharge to SNF today.  We ask the SNF to please do the following: 1. Please obtain vital signs at least one time daily 2.Please weigh the  patient daily. If he or she continues to gain weight or develops lower extremity edema, contact the office at [phone]. 3. Ambulate patient at least three times daily and please use sternal precautions.  Latest Vital Signs: Blood pressure (!) 116/57, pulse 74, temperature 98.6 F (37 C), temperature source Oral, resp. rate 20, height 4\' 11"  (1.499 m), weight 70.8 kg, SpO2 95 %.  Physical Exam: Cardiovascular: RRR Pulmonary: Mostly clear Abdomen: Soft, non tender, bowel sounds present. Extremities: Mild bilateral lower extremity edema. Wounds: RLE wounds are clean and dry.  No erythema or signs of infection. Pravena wound vac removed and sternal wound is clean, dry, no sign of infection    Discharge Condition: Stable and discharged to home.  Recent laboratory studies:  Lab Results  Component Value Date   WBC 14.1 (H) 05/25/2020   HGB 10.4 (L) 05/25/2020   HCT 32.6 (L) 05/25/2020   MCV 96.7 05/25/2020   PLT 169 05/25/2020   Lab Results  Component Value Date   NA 138 05/27/2020   K 5.0 05/27/2020   CL 106 05/27/2020   CO2 25 05/27/2020   CREATININE 1.41 (H) 05/30/2020   GLUCOSE 169 (H) 05/27/2020      Diagnostic Studies: DG Chest 2 View  Result Date: 05/26/2020 CLINICAL DATA:  Post heart surgery with chest pain. EXAM: CHEST - 2 VIEW COMPARISON:  05/24/2020 FINDINGS: Sternotomy wires unchanged. Persistent opacification over the left mid to lower lung with slightly better aeration likely effusion with atelectasis. Right lung is clear. Cardiomediastinal silhouette and remainder the exam is unchanged. IMPRESSION: Persistent hazy opacification over the left mid to lower lung with slightly better aeration likely effusion/atelectasis. Electronically Signed   By: Elberta Fortisaniel  Boyle M.D.   On: 05/26/2020 10:59   DG Chest 2 View  Result Date: 05/21/2020 CLINICAL DATA:  Chest pain EXAM: CHEST - 2 VIEW COMPARISON:  05/17/2020 FINDINGS: The heart size and mediastinal contours are within  normal limits. Both lungs are clear. The visualized skeletal structures are unremarkable. IMPRESSION: No active cardiopulmonary disease. Electronically Signed   By: Deatra RobinsonKevin  Herman M.D.   On: 05/21/2020 21:56   CARDIAC CATHETERIZATION  Result Date: 05/21/2020  Prox LAD to Mid LAD lesion is 95% stenosed.  Ost Cx to Prox Cx lesion is 100% stenosed.  Prox RCA lesion is 90% stenosed.  Mid RCA lesion is 100% stenosed.  LV end diastolic pressure is normal.  1. Critical 3 vessel occlusive CAD.    - 95% proximal to mid LAD    -100% proximal LCx. OM1 and OM2 fill by left to left collaterals    - 100% mid RCA. Distal vessel fills by right to right and left to right collaterals 2. Normal LVEDP Plan: CT surgery consultation for CABG   NM Myocar Multi W/Spect W/Wall Motion / EF  Result Date: 05/18/2020  Mild progression of baseline inferior and lateral ST/T changes after regadenoson  Chest pain after regadenoson, mild improvement with sublignual nitroglycerin x 2  Findings  consistent with large inferior/inferoapical/inferolateral prior myocardial infarction with high degree of peri-infarct ischemia. Large inferoseptal/septal infarct with high degree of peri-infarct ischemia. Overall SSS 25, SRS 6, SDS 19  This is a high risk study.  The left ventricular ejection fraction is normal (55-65%).    DG Chest Port 1 View  Result Date: 05/24/2020 CLINICAL DATA:  Pleural effusion.  Recent CABG. EXAM: PORTABLE CHEST 1 VIEW COMPARISON:  Chest x-ray from yesterday. FINDINGS: Interval removal of the endotracheal and enteric tubes. Unchanged right internal jugular central venous catheter. Unchanged mediastinal and bilateral chest tubes. Stable cardiomediastinal silhouette status post CABG. Unchanged small left pleural effusion and left greater than right basilar atelectasis. No pneumothorax. No acute osseous abnormality. IMPRESSION: 1. Unchanged small left pleural effusion and left greater than right basilar atelectasis.  Electronically Signed   By: Obie Dredge M.D.   On: 05/24/2020 09:33   DG Chest Port 1 View  Result Date: 05/23/2020 CLINICAL DATA:  Orogastric tube placement EXAM: PORTABLE CHEST 1 VIEW COMPARISON:  05/22/2020 at 1:29 p.m. FINDINGS: Support Apparatus: --Endotracheal tube: Tip at the level of the clavicular heads. --Enteric tube:Tip projects within the stomach --Catheter(s):Right internal jugular vein approach central venous catheter tip is at the cavoatrial junction. --Other: Mediastinal drain and chest tubes are unchanged. Unchanged left basilar atelectasis. IMPRESSION: 1. Endotracheal tube tip at the level of the clavicular heads. 2. Orogastric tube tip in the stomach. Electronically Signed   By: Deatra Robinson M.D.   On: 05/23/2020 03:05   DG Chest Port 1 View  Result Date: 05/22/2020 CLINICAL DATA:  Post CABG EXAM: PORTABLE CHEST 1 VIEW COMPARISON:  05/21/2020 FINDINGS: Endotracheal tube is approximately 3-4 cm above the carina. Right IJ central line tip overlies the SVC. Bilateral chest tubes are present. No pneumothorax. Low lung volumes. Left basilar atelectasis. Interval CABG. IMPRESSION: Lines and tubes as above. No pneumothorax. Left basilar atelectasis. Electronically Signed   By: Guadlupe Spanish M.D.   On: 05/22/2020 13:53   DG Chest Portable 1 View  Result Date: 05/17/2020 CLINICAL DATA:  Chest pain. EXAM: PORTABLE CHEST 1 VIEW COMPARISON:  07/27/2017. FINDINGS: Mediastinum hilar structures normal. Mild left base subsegmental atelectasis/infiltrate. Small left pleural effusion. No pneumothorax. Cardiomegaly. No pulmonary venous congestion. Degenerative change thoracic spine. IMPRESSION: Mild left base subsegmental atelectasis/infiltrate. Electronically Signed   By: Maisie Fus  Register   On: 05/17/2020 16:19   ECHOCARDIOGRAM COMPLETE  Result Date: 05/18/2020    ECHOCARDIOGRAM REPORT   Patient Name:   [name] [name] Date of Exam: 05/18/2020 Medical Rec #:  [id number]      Height:       59.0 in  Accession #:    [id number]     Weight:       157.8 lb Date of Birth:  [birth year]     BSA:          1.668 m Patient Age:    78 years       BP:           122/56 mmHg Patient Gender: F              HR:           65 bpm. Exam [facility]:  [facility] Procedure: 2D Echo Indications:    Chest Pain 786.50 / R07.9  History:        Patient has no prior history of Echocardiogram examinations.                 COPD, Signs/Symptoms:Chest  Pain; Risk Factors:Diabetes and                 Non-Smoker. GERD.  Sonographer:    Leavy Cella RDCS (AE) Referring Phys: [place]  1. Left ventricular ejection fraction, by estimation, is 55 to 60%. The left ventricle has normal function. The left ventricle has no regional wall motion abnormalities. There is mild left ventricular hypertrophy. Left ventricular diastolic parameters are indeterminate.  2. Right ventricular systolic function is normal. The right ventricular size is normal.  3. Left atrial size was mildly dilated.  4. The mitral valve is normal in structure. Trivial mitral valve regurgitation. No evidence of mitral stenosis.  5. The aortic valve has an indeterminant number of cusps. Aortic valve regurgitation is not visualized. No aortic stenosis is present.  6. The inferior vena cava is normal in size with greater than 50% respiratory variability, suggesting right atrial pressure of 3 mmHg. FINDINGS  Left Ventricle: Left ventricular ejection fraction, by estimation, is 55 to 60%. The left ventricle has normal function. The left ventricle has no regional wall motion abnormalities. The left ventricular internal cavity size was normal in size. There is  mild left ventricular hypertrophy. Left ventricular diastolic parameters are indeterminate. Right Ventricle: The right ventricular size is normal. No increase in right ventricular wall thickness. Right ventricular systolic function is normal. Left Atrium: Left atrial size was mildly dilated. Right Atrium:  Right atrial size was normal in size. Pericardium: There is no evidence of pericardial effusion. Mitral Valve: The mitral valve is normal in structure. Trivial mitral valve regurgitation. No evidence of mitral valve stenosis. Tricuspid Valve: The tricuspid valve is normal in structure. Tricuspid valve regurgitation is trivial. No evidence of tricuspid stenosis. Aortic Valve: The aortic valve has an indeterminant number of cusps. Aortic valve regurgitation is not visualized. No aortic stenosis is present. Aortic valve mean gradient measures 4.2 mmHg. Aortic valve peak gradient measures 7.3 mmHg. Aortic valve area, by VTI measures 1.57 cm. Pulmonic Valve: The pulmonic valve was not well visualized. Pulmonic valve regurgitation is not visualized. No evidence of pulmonic stenosis. Aorta: The aortic root is normal in size and structure. Pulmonary Artery: Indeterminant PASP, inadequate TR jet. Venous: The inferior vena cava is normal in size with greater than 50% respiratory variability, suggesting right atrial pressure of 3 mmHg. IAS/Shunts: No atrial level shunt detected by color flow Doppler.  LEFT VENTRICLE PLAX 2D LVIDd:         4.57 cm  Diastology LVIDs:         3.14 cm  LV e' lateral:   8.60 cm/s LV PW:         1.20 cm  LV E/e' lateral: 9.2 LV IVS:        1.17 cm  LV e' medial:    6.49 cm/s LVOT diam:     1.90 cm  LV E/e' medial:  12.2 LV SV:         50 LV SV Index:   30 LVOT Area:     2.84 cm  RIGHT VENTRICLE RV S prime:     11.10 cm/s TAPSE (M-mode): 1.9 cm LEFT ATRIUM             Index       RIGHT ATRIUM           Index LA diam:        3.30 cm 1.98 cm/m  RA Area:     13.40 cm LA Vol (A2C):  40.6 ml 24.35 ml/m RA Volume:   33.80 ml  20.27 ml/m LA Vol (A4C):   52.8 ml 31.66 ml/m LA Biplane Vol: 49.8 ml 29.86 ml/m  AORTIC VALVE AV Area (Vmax):    1.56 cm AV Area (Vmean):   1.41 cm AV Area (VTI):     1.57 cm AV Vmax:           134.65 cm/s AV Vmean:          99.831 cm/s AV VTI:            0.320 m AV Peak  Grad:      7.3 mmHg AV Mean Grad:      4.2 mmHg LVOT Vmax:         74.21 cm/s LVOT Vmean:        49.672 cm/s LVOT VTI:          0.177 m LVOT/AV VTI ratio: 0.55  AORTA Ao Root diam: 2.50 cm MITRAL VALVE MV Area (PHT): 2.91 cm    SHUNTS MV Decel Time: 261 msec    Systemic VTI:  0.18 m MV E velocity: 79.10 cm/s  Systemic Diam: 1.90 cm MV A velocity: 66.90 cm/s MV E/A ratio:  1.18 Dina Rich MD Electronically signed by Dina Rich MD Signature Date/Time: 05/18/2020/9:43:37 AM    Final    ECHO INTRAOPERATIVE TEE  Result Date: 05/22/2020  *INTRAOPERATIVE TRANSESOPHAGEAL REPORT *  Patient Name:   [name] [name] Date of Exam: 05/22/2020 Medical Rec #:  [id number]      Height:       59.0 in Accession #:    [id number]     Weight:       154.3 lb Date of Birth:  October 05, 1941     BSA:          1.65 m Patient Age:    78 years       BP:           102/68 mmHg Patient Gender: F              HR:           60 bpm. Exam Location:  Anesthesiology Transesophogeal exam was perform intraoperatively during surgical procedure. Patient was closely monitored under general anesthesia during the entirety of examination. Indications:     CAD Performing Phys: [id number] HARRELL O LIGHTFOOT Complications: No known complications during this procedure. POST-OP IMPRESSIONS - Left Ventricle: The left ventricle is unchanged from pre-bypass. - Right Ventricle: The right ventricle appears unchanged from pre-bypass. - Aorta: The aorta appears unchanged from pre-bypass. - Left Atrium: The left atrium appears unchanged from pre-bypass. - Left Atrial Appendage: The left atrial appendage appears unchanged from pre-bypass. - Aortic Valve: The aortic valve appears unchanged from pre-bypass. - Mitral Valve: The mitral valve appears unchanged from pre-bypass. - Tricuspid Valve: The tricuspid valve appears unchanged from pre-bypass. - Interatrial Septum: The interatrial septum appears unchanged from pre-bypass. - Interventricular Septum: The interventricular  septum appears unchanged from pre-bypass. - Pericardium: The pericardium appears unchanged from pre-bypass. PRE-OP FINDINGS  Left Ventricle: The left ventricle has normal systolic function, with an ejection fraction of 55-60%. The cavity size was normal. There is mildly increased left ventricular wall thickness. No evidence of left ventricular regional wall motion abnormalities. Right Ventricle: The right ventricle has normal systolic function. The cavity was normal. There is no increase in right ventricular wall thickness. Left Atrium: Left atrial size was normal in size. LAA velocity measured 27 cm/sec. The left  atrial appendage is well visualized and there is no evidence of thrombus present. Left atrial appendage velocity is reduced at less than 40 cm/s. Right Atrium: Right atrial size was normal in size. Right atrial pressure is estimated at 10 mmHg. Interatrial Septum: No atrial level shunt detected by color flow Doppler. Pericardium: There is no evidence of pericardial effusion. Mitral Valve: The mitral valve is normal in structure. Mitral valve regurgitation is mild by color flow Doppler. The MR jet is centrally-directed. There is No evidence of mitral stenosis. Tricuspid Valve: The tricuspid valve was normal in structure. Tricuspid valve regurgitation was not visualized by color flow Doppler. Aortic Valve: The aortic valve is normal in structure. Aortic valve regurgitation was not visualized by color flow Doppler. There is no evidence of aortic valve stenosis. Pulmonic Valve: The pulmonic valve was normal in structure. Pulmonic valve regurgitation is not visualized by color flow Doppler. Aorta: There is evidence of layered immobile plaque in the aortic arch and descending aorta; Grade III, measuring 3-31mm in size. Pulmonary Artery: The pulmonary artery is of normal size.  Arrie Aran MD Electronically signed by Arrie Aran MD Signature Date/Time: 05/22/2020/2:19:34 PM    Final    VAS US DOPPLER PRE  CABG  Result Date: 05/21/2020 PREOPERATIVE VASCULAR EVALUATION  Indications:      Pre-CABG. Risk Factors:     Hypertension, Diabetes, coronary artery disease. Other Factors:    COPD, single kidney with CKD III. Comparison Study: No prior study on file Performing Technologist: Sherren Kerns RVS  Examination Guidelines: A complete evaluation includes B-mode imaging, spectral Doppler, color Doppler, and power Doppler as needed of all accessible portions of each vessel. Bilateral testing is considered an integral part of a complete examination. Limited examinations for reoccurring indications may be performed as noted.  Right Carotid Findings: +----------+--------+--------+--------+-----------+------------------+           PSV cm/sEDV cm/sStenosisDescribe   Comments           +----------+--------+--------+--------+-----------+------------------+ CCA Prox  111     13                         intimal thickening +----------+--------+--------+--------+-----------+------------------+ CCA Distal96      14                         intimal thickening +----------+--------+--------+--------+-----------+------------------+ ICA Prox  93      35              homogeneous                   +----------+--------+--------+--------+-----------+------------------+ ICA Distal70      12                                            +----------+--------+--------+--------+-----------+------------------+ ECA       176     6                                             +----------+--------+--------+--------+-----------+------------------+ Portions of this table do not appear on this page. +----------+--------+-------+--------+------------+           PSV cm/sEDV cmsDescribeArm Pressure +----------+--------+-------+--------+------------+ Subclavian220                                 +----------+--------+-------+--------+------------+ +---------+--------+--+--------+--+  VertebralPSV cm/s51EDV  cm/s11 +---------+--------+--+--------+--+ Left Carotid Findings: +----------+--------+--------+--------+-----------+------------------+           PSV cm/sEDV cm/sStenosisDescribe   Comments           +----------+--------+--------+--------+-----------+------------------+ CCA Prox  134     14                         intimal thickening +----------+--------+--------+--------+-----------+------------------+ CCA Distal131     18                         intimal thickening +----------+--------+--------+--------+-----------+------------------+ ICA Prox  108     30              homogeneous                   +----------+--------+--------+--------+-----------+------------------+ ICA Distal70      21                                            +----------+--------+--------+--------+-----------+------------------+ ECA       240     8                                             +----------+--------+--------+--------+-----------+------------------+ +----------+--------+--------+--------+------------+ SubclavianPSV cm/sEDV cm/sDescribeArm Pressure +----------+--------+--------+--------+------------+           155                                  +----------+--------+--------+--------+------------+ +---------+--------+--+--------+-+ VertebralPSV cm/s87EDV cm/s7 +---------+--------+--+--------+-+  ABI Findings: +---------+------------------+-----+--------+-----------------------------+ Right    Rt Pressure (mmHg)IndexWaveformComment                       +---------+------------------+-----+--------+-----------------------------+ Brachial                                restricted, radial cath today +---------+------------------+-----+--------+-----------------------------+ PTA      54                0.43 biphasic                              +---------+------------------+-----+--------+-----------------------------+ DP       80                0.63  biphasic                              +---------+------------------+-----+--------+-----------------------------+ Great Toe33                0.26                                       +---------+------------------+-----+--------+-----------------------------+ +---------+------------------+-----+---------+-------+ Left     Lt Pressure (mmHg)IndexWaveform Comment +---------+------------------+-----+---------+-------+ Brachial 127                    triphasic        +---------+------------------+-----+---------+-------+ PTA      84  0.66 biphasic         +---------+------------------+-----+---------+-------+ DP       93                0.73 biphasic         +---------+------------------+-----+---------+-------+ Great Toe57                0.45                  +---------+------------------+-----+---------+-------+ +-------+---------------+----------------+ ABI/TBIToday's ABI/TBIPrevious ABI/TBI +-------+---------------+----------------+ Right  0.63                            +-------+---------------+----------------+ Left   0.73                            +-------+---------------+----------------+  Right Doppler Findings: +-----------+--------+-----+-------+-----------------------------+ Site       PressureIndexDopplerComments                      +-----------+--------+-----+-------+-----------------------------+ Brachial                       restricted, radial cath today +-----------+--------+-----+-------+-----------------------------+ Radial                         restricted, radial cath today +-----------+--------+-----+-------+-----------------------------+ Ulnar                          restricted, radial cath today +-----------+--------+-----+-------+-----------------------------+ Palmar Arch                    restricted, radial cath today +-----------+--------+-----+-------+-----------------------------+  Left  Doppler Findings: +--------+--------+-----+---------+--------+ Site    PressureIndexDoppler  Comments +--------+--------+-----+---------+--------+ [id number]          triphasic         +--------+--------+-----+---------+--------+ Radial               triphasic         +--------+--------+-----+---------+--------+ Ulnar                triphasic         +--------+--------+-----+---------+--------+  Summary: Right Carotid: The extracranial vessels were near-normal with only minimal wall                thickening or plaque. Left Carotid: The extracranial vessels were near-normal with only minimal wall               thickening or plaque. Vertebrals:  Bilateral vertebral arteries demonstrate antegrade flow. Subclavians: Normal flow hemodynamics were seen in bilateral subclavian              arteries. Right ABI: Resting right ankle-brachial index indicates moderate right lower extremity arterial disease. The right toe-brachial index is abnormal. Left ABI: Resting left ankle-brachial index indicates moderate left lower extremity arterial disease. The left toe-brachial index is abnormal. Left Upper Extremity: Doppler waveforms decrease <50% w left radial compression. Doppler waveform obliterate with left ulnar compression.  Electronically signed by Gretta Began MD on 05/21/2020 at 6:57:53 PM.    Final       Discharge Medications: Allergies as of 06/01/2020       Reactions   Contrast Media [iodinated Diagnostic Agents]    Unknown reaction. Due to kidney issues. Do not use   Morphine And Related    Other    Pt has multiple allergies  per pt but unsure of all.    Penicillins    Unknown reaction   Codeine Rash        Medication List     STOP taking these medications    aspirin 81 MG tablet Replaced by: aspirin 325 MG EC tablet   Janumet 50-1000 MG tablet Generic drug: sitaGLIPtin-metformin   losartan 50 MG tablet Commonly known as: COZAAR       TAKE these medications     AIRBORNE GUMMIES PO Take 1 each by mouth every morning.   aspirin 325 MG EC tablet Take 1 tablet (325 mg total) by mouth daily. Replaces: aspirin 81 MG tablet   atorvastatin 40 MG tablet Commonly known as: LIPITOR Take 1 tablet (40 mg total) by mouth daily.   fish oil-omega-3 fatty acids 1000 MG capsule Take 1 g by mouth 2 (two) times daily.   HAIR SKIN & NAILS GUMMIES PO Take 2 each by mouth every morning.   linagliptin 5 MG Tabs tablet Commonly known as: TRADJENTA Take 1 tablet (5 mg total) by mouth daily.   metoprolol tartrate 25 MG tablet Commonly known as: LOPRESSOR Take 0.5 tablets (12.5 mg total) by mouth 2 (two) times daily.   multivitamin with minerals Tabs tablet Take 1 tablet by mouth daily.   traMADol 50 MG tablet Commonly known as: ULTRAM Take 1 tablet (50 mg total) by mouth every 6 (six) hours as needed for moderate pain.               Durable Medical Equipment  (From admission, onward)           Start     Ordered   05/30/20 0701  For home use only DME Walker rolling  Once       Question Answer Comment  Walker: With 5 Inch Wheels   Patient needs a walker to treat with the following condition Physical deconditioning      05/30/20 0700   05/30/20 0701  For home use only DME 3 n 1  Once        05/30/20 0700           The patient has been discharged on:   1.Beta Blocker:  Yes [  x ]                              No   [   ]                              If No, reason:  2.Ace Inhibitor/ARB: Yes [   ]                                     No  [  x  ]                                     If No, reason:Elevated creainine  3.Statin:   Yes [  x ]                  No  [   ]                  If No, reason:  4.Ecasa:  Yes  [  x ]                  No   [   ]                  If No, reason:  Follow Up Appointments:  Follow-up Information     Corliss Skains, MD. Go on 06/07/2020.   Specialty: Cardiothoracic Surgery Why:  Appointment time is at 1:00 pm Contact information: [address] [place] [address] [phone]         Netta Neat., NP. Go on 06/08/2020.   Specialty: Cardiology Why: Appointment time is at 9:00 am Contact information: [address] [place] [address] [phone]         Wandra Feinstein, MD. Call.   Specialty: Family Medicine Why: for a follow up appointment regarding further surveillance of HGA1C 7.4 and restarting Janumet if creatinine normalized Contact information: [address] [place] [address] [phone]                 Signed: Lelon Huh ZimmermanPA-C 06/01/2020, 8:21 AM

## 2020-05-23 NOTE — Progress Notes (Signed)
Progress Note  Patient Name: HERMENA SWINT Date of Encounter: 05/23/2020  [facility] Cardiologist: Carlyle Dolly, MD   Subjective   Remains intubated, awake and following commands appears comfortable  Inpatient Medications    Scheduled Meds: . acetaminophen  1,000 mg Oral Q6H   Or  . acetaminophen (TYLENOL) oral liquid 160 mg/5 mL  1,000 mg Per Tube Q6H  . aspirin EC  325 mg Oral Daily   Or  . aspirin  324 mg Per Tube Daily  . atorvastatin  40 mg Oral Daily  . bisacodyl  10 mg Oral Daily   Or  . bisacodyl  10 mg Rectal Daily  . chlorhexidine gluconate (MEDLINE KIT)  15 mL Mouth Rinse BID  . Chlorhexidine Gluconate Cloth  6 each Topical Daily  . docusate sodium  200 mg Oral Daily  . mouth rinse  15 mL Mouth Rinse 10 times per day  . [START ON 05/24/2020] pantoprazole  40 mg Oral Daily  . sodium chloride flush  10-40 mL Intracatheter Q12H  . sodium chloride flush  3 mL Intravenous Q12H   Continuous Infusions: . sodium chloride 20 mL/hr at 05/23/20 0600  . sodium chloride    . sodium chloride    . sodium chloride 10 mL/hr at 05/23/20 0600  . albumin human 12.5 g (05/22/20 2324)  . dexmedetomidine (PRECEDEX) IV infusion 0.5 mcg/kg/hr (05/23/20 0600)  . DOBUTamine 2.5 mcg/kg/min (05/23/20 0600)  . insulin 5 mL/hr at 05/23/20 0600  . lactated ringers    . lactated ringers    . lactated ringers 20 mL/hr at 05/23/20 0600  . levofloxacin (LEVAQUIN) IV    . milrinone    . nitroGLYCERIN Stopped (05/23/20 0230)  . norepinephrine (LEVOPHED) Adult infusion 1 mcg/min (05/23/20 0526)   PRN Meds: sodium chloride, sodium chloride, albumin human, dextrose, fentaNYL (SUBLIMAZE) injection, lactated ringers, midazolam, ondansetron (ZOFRAN) IV, oxyCODONE, sodium chloride flush, sodium chloride flush, traMADol   Vital Signs    Vitals:   05/23/20 0615 05/23/20 0630 05/23/20 0645 05/23/20 0700  BP:  (!) 111/57  (!) 116/49  Pulse: 81 89 82 84  Resp: (!) 24 19 20 18   Temp:  98.6 F (37 C) 98.6 F (37 C) 98.6 F (37 C) 98.6 F (37 C)  TempSrc:      SpO2: 99% 99% 99% 99%  Weight:   74.4 kg   Height:        Intake/Output Summary (Last 24 hours) at 05/23/2020 0745 Last data filed at 05/23/2020 0600 Gross per 24 hour  Intake 5015.33 ml  Output 3964 ml  Net 1051.33 ml   Last 3 Weights 05/23/2020 05/22/2020 05/21/2020  Weight (lbs) 164 lb 0.4 oz 154 lb 5.2 oz 156 lb 1.6 oz  Weight (kg) 74.4 kg 70 kg 70.806 kg      Telemetry    Sinus rhythm, few bradycardic episodes - Personally Reviewed  ECG    Normal sinus rhythm 85 bpm, nonspecific ST abnormality - Personally Reviewed  Physical Exam  Intubated, awake and alert GEN: No acute distress.   Neck: No JVD Cardiac: RRR, no murmurs, rubs, or gallops.  Respiratory: Clear to auscultation bilaterally. GI: Soft, nontender, non-distended  MS:  Mild diffuse edema; No deformity. Neuro:  Nonfocal  Psych: Normal affect   Labs    High Sensitivity Troponin:   Recent Labs  Lab 05/17/20 1538 05/17/20 1722 05/17/20 1951 05/18/20 1255 05/18/20 1452  TROPONINIHS 9 12 13 9 14       Chemistry  Recent Labs  Lab 05/17/20 1539 05/20/20 0229 05/22/20 0235 05/22/20 0803 05/22/20 1148 05/22/20 1321 05/22/20 1904 05/22/20 2118 05/23/20 0138 05/23/20 0500 05/23/20 0527  NA  --    < > 135   < > 139   < > 139   < > 143 139 141  K  --    < > 5.2*   < > 4.3   < > 4.7   < > 4.7 4.1 4.2  CL  --    < > 105   < > 108  --  111  --   --  109  --   CO2  --    < > 21*  --   --   --  20*  --   --  21*  --   GLUCOSE  --    < > 268*   < > 194*  --  168*  --   --  153*  --   BUN  --    < > 34*   < > 28*  --  23  --   --  19  --   CREATININE  --    < > 1.49*   < > 1.00  --  1.11*  --   --  1.27*  --   CALCIUM  --    < > 9.0  --   --   --  8.3*  --   --  8.2*  --   PROT 7.0  --   --   --   --   --   --   --   --   --   --   ALBUMIN 3.8  --   --   --   --   --   --   --   --   --   --   AST 17  --   --   --   --   --   --   --    --   --   --   ALT 18  --   --   --   --   --   --   --   --   --   --   ALKPHOS 48  --   --   --   --   --   --   --   --   --   --   BILITOT 0.4  --   --   --   --   --   --   --   --   --   --   GFRNONAA  --    < > 33*  --   --   --  48*  --   --  40*  --   GFRAA  --    < > 39*  --   --   --  55*  --   --  47*  --   ANIONGAP  --    < > 9  --   --   --  8  --   --  9  --    < > = values in this interval not displayed.     Hematology Recent Labs  Lab 05/22/20 1319 05/22/20 1321 05/22/20 1904 05/22/20 2118 05/23/20 0138 05/23/20 0500 05/23/20 0527  WBC 18.2*  --  15.8*  --   --  13.4*  --   RBC 3.39*  --  3.53*  --   --  3.44*  --   HGB 10.5*   < > 11.0*   < > 10.2* 10.6* 10.9*  HCT 31.9*   < > 32.6*   < > 30.0* 31.5* 32.0*  MCV 94.1  --  92.4  --   --  91.6  --   MCH 31.0  --  31.2  --   --  30.8  --   MCHC 32.9  --  33.7  --   --  33.7  --   RDW 12.9  --  13.2  --   --  13.2  --   PLT 128*  --  137*  --   --  140*  --    < > = values in this interval not displayed.    BNPNo results for input(s): BNP, PROBNP in the last 168 hours.   DDimer No results for input(s): DDIMER in the last 168 hours.   Radiology    DG Chest 2 View  Result Date: 05/21/2020 CLINICAL DATA:  Chest pain EXAM: CHEST - 2 VIEW COMPARISON:  05/17/2020 FINDINGS: The heart size and mediastinal contours are within normal limits. Both lungs are clear. The visualized skeletal structures are unremarkable. IMPRESSION: No active cardiopulmonary disease. Electronically Signed   By: Ulyses Jarred M.D.   On: 05/21/2020 21:56   CARDIAC CATHETERIZATION  Result Date: 05/21/2020  Prox LAD to Mid LAD lesion is 95% stenosed.  Ost Cx to Prox Cx lesion is 100% stenosed.  Prox RCA lesion is 90% stenosed.  Mid RCA lesion is 100% stenosed.  LV end diastolic pressure is normal.  1. Critical 3 vessel occlusive CAD.    - 95% proximal to mid LAD    -100% proximal LCx. OM1 and OM2 fill by left to left collaterals    - 100% mid  RCA. Distal vessel fills by right to right and left to right collaterals 2. Normal LVEDP Plan: CT surgery consultation for CABG   DG Chest Port 1 View  Result Date: 05/23/2020 CLINICAL DATA:  Orogastric tube placement EXAM: PORTABLE CHEST 1 VIEW COMPARISON:  05/22/2020 at 1:29 p.m. FINDINGS: Support Apparatus: --Endotracheal tube: Tip at the level of the clavicular heads. --Enteric tube:Tip projects within the stomach --Catheter(s):Right internal jugular vein approach central venous catheter tip is at the cavoatrial junction. --Other: Mediastinal drain and chest tubes are unchanged. Unchanged left basilar atelectasis. IMPRESSION: 1. Endotracheal tube tip at the level of the clavicular heads. 2. Orogastric tube tip in the stomach. Electronically Signed   By: Ulyses Jarred M.D.   On: 05/23/2020 03:05   DG Chest Port 1 View  Result Date: 05/22/2020 CLINICAL DATA:  Post CABG EXAM: PORTABLE CHEST 1 VIEW COMPARISON:  05/21/2020 FINDINGS: Endotracheal tube is approximately 3-4 cm above the carina. Right IJ central line tip overlies the SVC. Bilateral chest tubes are present. No pneumothorax. Low lung volumes. Left basilar atelectasis. Interval CABG. IMPRESSION: Lines and tubes as above. No pneumothorax. Left basilar atelectasis. Electronically Signed   By: Macy Mis M.D.   On: 05/22/2020 13:53   ECHO INTRAOPERATIVE TEE  Result Date: 05/22/2020  *INTRAOPERATIVE TRANSESOPHAGEAL REPORT *  Patient Name:   STORIE HEFFERN Date of Exam: 05/22/2020 Medical Rec #:  [id number]      Height:       59.0 in Accession #:    [id number]     Weight:       154.3 lb Date of Birth:  May 19, 1941  BSA:          1.65 m Patient Age:    45 years       BP:           102/68 mmHg Patient Gender: F              HR:           60 bpm. Exam Location:  Anesthesiology Transesophogeal exam was perform intraoperatively during surgical procedure. Patient was closely monitored under general anesthesia during the entirety of examination.  Indications:     CAD Performing Phys: [id number] HARRELL O LIGHTFOOT Complications: No known complications during this procedure. POST-OP IMPRESSIONS - Left Ventricle: The left ventricle is unchanged from pre-bypass. - Right Ventricle: The right ventricle appears unchanged from pre-bypass. - Aorta: The aorta appears unchanged from pre-bypass. - Left Atrium: The left atrium appears unchanged from pre-bypass. - Left Atrial Appendage: The left atrial appendage appears unchanged from pre-bypass. - Aortic Valve: The aortic valve appears unchanged from pre-bypass. - Mitral Valve: The mitral valve appears unchanged from pre-bypass. - Tricuspid Valve: The tricuspid valve appears unchanged from pre-bypass. - Interatrial Septum: The interatrial septum appears unchanged from pre-bypass. - Interventricular Septum: The interventricular septum appears unchanged from pre-bypass. - Pericardium: The pericardium appears unchanged from pre-bypass. PRE-OP FINDINGS  Left Ventricle: The left ventricle has normal systolic function, with an ejection fraction of 55-60%. The cavity size was normal. There is mildly increased left ventricular wall thickness. No evidence of left ventricular regional wall motion abnormalities. Right Ventricle: The right ventricle has normal systolic function. The cavity was normal. There is no increase in right ventricular wall thickness. Left Atrium: Left atrial size was normal in size. LAA velocity measured 27 cm/sec. The left atrial appendage is well visualized and there is no evidence of thrombus present. Left atrial appendage velocity is reduced at less than 40 cm/s. Right Atrium: Right atrial size was normal in size. Right atrial pressure is estimated at 10 mmHg. Interatrial Septum: No atrial level shunt detected by color flow Doppler. Pericardium: There is no evidence of pericardial effusion. Mitral Valve: The mitral valve is normal in structure. Mitral valve regurgitation is mild by color flow Doppler. The  MR jet is centrally-directed. There is No evidence of mitral stenosis. Tricuspid Valve: The tricuspid valve was normal in structure. Tricuspid valve regurgitation was not visualized by color flow Doppler. Aortic Valve: The aortic valve is normal in structure. Aortic valve regurgitation was not visualized by color flow Doppler. There is no evidence of aortic valve stenosis. Pulmonic Valve: The pulmonic valve was normal in structure. Pulmonic valve regurgitation is not visualized by color flow Doppler. Aorta: There is evidence of layered immobile plaque in the aortic arch and descending aorta; Grade III, measuring 3-61m in size. Pulmonary Artery: The pulmonary artery is of normal size.  SHoy MornMD Electronically signed by SHoy MornMD Signature Date/Time: 05/22/2020/2:19:34 PM    Final    VAS UKoreaDOPPLER PRE CABG  Result Date: 05/21/2020 PREOPERATIVE VASCULAR EVALUATION  Indications:      Pre-CABG. Risk Factors:     Hypertension, Diabetes, coronary artery disease. Other Factors:    COPD, single kidney with CKD III. Comparison Study: No prior study on file Performing Technologist: KSharion DoveRVS  Examination Guidelines: A complete evaluation includes B-mode imaging, spectral Doppler, color Doppler, and power Doppler as needed of all accessible portions of each vessel. Bilateral testing is considered an integral part of a complete examination. Limited examinations for reoccurring  indications may be performed as noted.  Right Carotid Findings: +----------+--------+--------+--------+-----------+------------------+           PSV cm/sEDV cm/sStenosisDescribe   Comments           +----------+--------+--------+--------+-----------+------------------+ CCA Prox  111     13                         intimal thickening +----------+--------+--------+--------+-----------+------------------+ CCA Distal96      14                         intimal thickening  +----------+--------+--------+--------+-----------+------------------+ ICA Prox  93      35              homogeneous                   +----------+--------+--------+--------+-----------+------------------+ ICA Distal70      12                                            +----------+--------+--------+--------+-----------+------------------+ ECA       176     6                                             +----------+--------+--------+--------+-----------+------------------+ Portions of this table do not appear on this page. +----------+--------+-------+--------+------------+           PSV cm/sEDV cmsDescribeArm Pressure +----------+--------+-------+--------+------------+ Subclavian220                                 +----------+--------+-------+--------+------------+ +---------+--------+--+--------+--+ VertebralPSV cm/s51EDV cm/s11 +---------+--------+--+--------+--+ Left Carotid Findings: +----------+--------+--------+--------+-----------+------------------+           PSV cm/sEDV cm/sStenosisDescribe   Comments           +----------+--------+--------+--------+-----------+------------------+ CCA Prox  134     14                         intimal thickening +----------+--------+--------+--------+-----------+------------------+ CCA Distal131     18                         intimal thickening +----------+--------+--------+--------+-----------+------------------+ ICA Prox  108     30              homogeneous                   +----------+--------+--------+--------+-----------+------------------+ ICA Distal70      21                                            +----------+--------+--------+--------+-----------+------------------+ ECA       240     8                                             +----------+--------+--------+--------+-----------+------------------+ +----------+--------+--------+--------+------------+ SubclavianPSV cm/sEDV  cm/sDescribeArm Pressure +----------+--------+--------+--------+------------+           155                                  +----------+--------+--------+--------+------------+ +---------+--------+--+--------+-+  VertebralPSV cm/s87EDV cm/s7 +---------+--------+--+--------+-+  ABI Findings: +---------+------------------+-----+--------+-----------------------------+ Right    Rt Pressure (mmHg)IndexWaveformComment                       +---------+------------------+-----+--------+-----------------------------+ Brachial                                restricted, radial cath today +---------+------------------+-----+--------+-----------------------------+ PTA      54                0.43 biphasic                              +---------+------------------+-----+--------+-----------------------------+ DP       80                0.63 biphasic                              +---------+------------------+-----+--------+-----------------------------+ Great Toe33                0.26                                       +---------+------------------+-----+--------+-----------------------------+ +---------+------------------+-----+---------+-------+ Left     Lt Pressure (mmHg)IndexWaveform Comment +---------+------------------+-----+---------+-------+ Brachial 127                    triphasic        +---------+------------------+-----+---------+-------+ PTA      84                0.66 biphasic         +---------+------------------+-----+---------+-------+ DP       93                0.73 biphasic         +---------+------------------+-----+---------+-------+ Great Toe57                0.45                  +---------+------------------+-----+---------+-------+ +-------+---------------+----------------+ ABI/TBIToday's ABI/TBIPrevious ABI/TBI +-------+---------------+----------------+ Right  0.63                             +-------+---------------+----------------+ Left   0.73                            +-------+---------------+----------------+  Right Doppler Findings: +-----------+--------+-----+-------+-----------------------------+ Site       PressureIndexDopplerComments                      +-----------+--------+-----+-------+-----------------------------+ Brachial                       restricted, radial cath today +-----------+--------+-----+-------+-----------------------------+ Radial                         restricted, radial cath today +-----------+--------+-----+-------+-----------------------------+ Ulnar                          restricted, radial cath today +-----------+--------+-----+-------+-----------------------------+ Palmar Arch                    restricted,  radial cath today +-----------+--------+-----+-------+-----------------------------+  Left Doppler Findings: +--------+--------+-----+---------+--------+ Site    PressureIndexDoppler  Comments +--------+--------+-----+---------+--------+ [id number]          triphasic         +--------+--------+-----+---------+--------+ Radial               triphasic         +--------+--------+-----+---------+--------+ Ulnar                triphasic         +--------+--------+-----+---------+--------+  Summary: Right Carotid: The extracranial vessels were near-normal with only minimal wall                thickening or plaque. Left Carotid: The extracranial vessels were near-normal with only minimal wall               thickening or plaque. Vertebrals:  Bilateral vertebral arteries demonstrate antegrade flow. Subclavians: Normal flow hemodynamics were seen in bilateral subclavian              arteries. Right ABI: Resting right ankle-brachial index indicates moderate right lower extremity arterial disease. The right toe-brachial index is abnormal. Left ABI: Resting left ankle-brachial index indicates moderate left lower  extremity arterial disease. The left toe-brachial index is abnormal. Left Upper Extremity: Doppler waveforms decrease <50% w left radial compression. Doppler waveform obliterate with left ulnar compression.  Electronically signed by Curt Jews MD on 05/21/2020 at 47:57:53 PM.    Final      Patient Profile     79 y.o. 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  Unstable angina: Now postoperative day #1 from multivessel CABG.  Vent weaning, progression per T CTS team.  Appreciate care of Dr. Kipp Brood. 2.  Transient bradycardia: No prolonged episodes are hemodynamically significant changes.  Continue observation.  Reviewed telemetry with one brief episode of junctional bradycardia and another episode of what appears to be Mobitz 1 block.  Continue to follow, avoid beta blockade.  Will follow with you.     For questions or updates, please contact [place] Please consult [email or web address] for contact info under        Signed, Sherren Mocha, MD  05/23/2020, 7:45 AM

## 2020-05-23 NOTE — Addendum Note (Signed)
Addendum  created 05/23/20 0505 by Adair Laundry, CRNA   Order list changed

## 2020-05-23 NOTE — Progress Notes (Signed)
Patient was given tylenol per tube and patient's HR decreased into the 30's. Currently NSR in the 80's, BP 137/46 (67). Strip saved and placed in chart.

## 2020-05-23 NOTE — Progress Notes (Signed)
[address]       [place], [address]             [phone]                 1 Day Post-Op Procedure(s) (LRB): CORONARY ARTERY BYPASS GRAFTING (CABG) x3 LIMA TO LAD, SVG TO OM1, SVG TO DISTAL RIGHT (N/A) TRANSESOPHAGEAL ECHOCARDIOGRAM (TEE) (N/A) Open Vein Harvest (Right)   Events: Failed extubation last night.   Apneic episodes with bradycardia during wean _______________________________________________________________ Vitals: BP (!) 116/49   Pulse 84   Temp 98.6 F (37 C)   Resp 18   Ht 4\' 11"  (1.499 m)   Wt 74.4 kg   SpO2 99%   BMI 33.13 kg/m   - Neuro: alert NAD  - Cardiovascular: sinus  Drips: dobutamine 2.5, levo 2.   CVP:  [0 mmHg-13 mmHg] 2 mmHg  - Pulm: clear Vent Mode: SIMV;PRVC;PSV FiO2 (%):  [40 %-60 %] 40 % Set Rate:  [4 bmp-12 bmp] 12 bmp Vt Set:  [350 mL-430 mL] 430 mL PEEP:  [5 cmH20] 5 cmH20 Pressure Support:  [10 cmH20] 10 cmH20 Plateau Pressure:  [13 cmH20-17 cmH20] 17 cmH20  ABG    Component Value Date/Time   PHART 7.382 05/23/2020 0527   PCO2ART 41.0 05/23/2020 0527   PO2ART 106 05/23/2020 0527   HCO3 24.3 05/23/2020 0527   TCO2 26 05/23/2020 0527   ACIDBASEDEF 1.0 05/23/2020 0527   O2SAT 98.0 05/23/2020 0527    - Abd: soft - Extremity: warm  .Intake/Output      06/08 0701 - 06/09 0700 06/09 0701 - 06/10 0700   P.O. 0    I.V. (mL/kg) 3422.8 (46)    Blood 426    IV Piggyback 1166.5    Total Intake(mL/kg) 5015.3 (67.4)    Urine (mL/kg/hr) 2860 (1.6)    Drains 34    Blood 850    Chest Tube 220    Total Output 3964    Net +1051.3            _______________________________________________________________ Labs: CBC Latest Ref Rng & Units 05/23/2020 05/23/2020 05/23/2020  WBC 4.0 - 10.5 K/uL - 13.4(H) -  Hemoglobin 12.0 - 15.0 g/dL 10.9(L) 10.6(L) 10.2(L)  Hematocrit 36.0 - 46.0 % 32.0(L) 31.5(L) 30.0(L)  Platelets 150 - 400 K/uL - 140(L) -   CMP Latest Ref Rng & Units 05/23/2020 05/23/2020 05/23/2020    Glucose 70 - 99 mg/dL - 153(H) -  BUN 8 - 23 mg/dL - 19 -  Creatinine 0.44 - 1.00 mg/dL - 1.27(H) -  Sodium 135 - 145 mmol/L 141 139 143  Potassium 3.5 - 5.1 mmol/L 4.2 4.1 4.7  Chloride 98 - 111 mmol/L - 109 -  CO2 22 - 32 mmol/L - 21(L) -  Calcium 8.9 - 10.3 mg/dL - 8.2(L) -  Total Protein 6.5 - 8.1 g/dL - - -  Total Bilirubin 0.3 - 1.2 mg/dL - - -  Alkaline Phos 38 - 126 U/L - - -  AST 15 - 41 U/L - - -  ALT 0 - 44 U/L - - -    CXR: PV congestion  _______________________________________________________________  Assessment and Plan: POD 1 s/p CABG  Neuro: sedation weaned for extubation CV: wean dob and levo as tolerated.  Holding BB.  On asp/statin Pulm: wean to extubated Renal: creat down, good uop GI: npo for now Heme: stable ID: afebrile Endo: SSI today once extubated and off gtts Dispo:  continue ICU care  Brynda Greathouse, MD 05/23/2020 7:44 AM

## 2020-05-23 NOTE — Progress Notes (Signed)
° °   °  [address]       [place] [address]             [phone]    POD # 1 CABG x 3  Up in chair eating   BP (!) 110/51    Pulse 88    Temp 99 F (37.2 C)    Resp 15    Ht 4\' 11"  (1.499 m)    Wt 74.4 kg    SpO2 100%    BMI 33.13 kg/m   Intake/Output Summary (Last 24 hours) at 05/23/2020 1846 Last data filed at 05/23/2020 1800 Gross per 24 hour  Intake 1728.66 ml  Output 1955 ml  Net -226.34 ml   CBG well controlled  K= 4.5, creatinine 1.32 Hct = 29  Doing well POD # 1  Vila Dory C. 07/23/2020, MD [facility] [phone]

## 2020-05-23 NOTE — Progress Notes (Signed)
Pt placed back on full support to rest for the rest of the night per MD.

## 2020-05-23 NOTE — Progress Notes (Signed)
Patient weaned on vent for extubation, no cuff leak auscultated. Patient's HR also suddenly decreased from 100's to 70's when performing vital capacity parameter. SBP 150-170's after dobutamine started, despite triturating nitro gtt. Patient also refusing morphine for pain due to allergy. MD made aware. See new/modified orders.

## 2020-05-24 ENCOUNTER — Inpatient Hospital Stay (HOSPITAL_COMMUNITY): Payer: Medicare Other

## 2020-05-24 LAB — GLUCOSE, CAPILLARY
Glucose-Capillary: 120 mg/dL — ABNORMAL HIGH (ref 70–99)
Glucose-Capillary: 178 mg/dL — ABNORMAL HIGH (ref 70–99)
Glucose-Capillary: 279 mg/dL — ABNORMAL HIGH (ref 70–99)
Glucose-Capillary: 219 mg/dL — ABNORMAL HIGH (ref 70–99)
Glucose-Capillary: 218 mg/dL — ABNORMAL HIGH (ref 70–99)

## 2020-05-24 LAB — BASIC METABOLIC PANEL
Anion gap: 7 (ref 5–15)
BUN: 22 mg/dL (ref 8–23)
CO2: 23 mmol/L (ref 22–32)
Calcium: 8.3 mg/dL — ABNORMAL LOW (ref 8.9–10.3)
Chloride: 105 mmol/L (ref 98–111)
Creatinine, Ser: 1.41 mg/dL — ABNORMAL HIGH (ref 0.44–1.00)
GFR calc Af Amer: 41 mL/min — ABNORMAL LOW (ref >60)
GFR calc non Af Amer: 36 mL/min — ABNORMAL LOW (ref >60)
Glucose, Bld: 164 mg/dL — ABNORMAL HIGH (ref 70–99)
Potassium: 4.4 mmol/L (ref 3.5–5.1)
Sodium: 135 mmol/L (ref 135–145)

## 2020-05-24 LAB — CBC
HCT: 31 % — ABNORMAL LOW (ref 36.0–46.0)
Hemoglobin: 10 g/dL — ABNORMAL LOW (ref 12.0–15.0)
MCH: 30.7 pg (ref 26.0–34.0)
MCHC: 32.3 g/dL (ref 30.0–36.0)
MCV: 95.1 fL (ref 80.0–100.0)
Platelets: 147 10*3/uL — ABNORMAL LOW (ref 150–400)
RBC: 3.26 MIL/uL — ABNORMAL LOW (ref 3.87–5.11)
RDW: 13.6 % (ref 11.5–15.5)
WBC: 16.6 10*3/uL — ABNORMAL HIGH (ref 4.0–10.5)
nRBC: 0 % (ref 0.0–0.2)

## 2020-05-24 MED ORDER — SODIUM CHLORIDE 0.9% FLUSH
3.0000 mL | INTRAVENOUS | Status: DC | PRN
  Administered 2020-05-28 – 2020-05-29 (×2): 3 mL via INTRAVENOUS

## 2020-05-24 MED ORDER — FUROSEMIDE 10 MG/ML IJ SOLN
40.0000 mg | Freq: Once | INTRAMUSCULAR | Status: AC
  Administered 2020-05-24: 40 mg via INTRAVENOUS
  Filled 2020-05-24: qty 4

## 2020-05-24 MED ORDER — MENTHOL 3 MG MT LOZG
1.0000 | LOZENGE | OROMUCOSAL | Status: DC | PRN
  Filled 2020-05-24: qty 9

## 2020-05-24 MED ORDER — METOPROLOL TARTRATE 25 MG PO TABS
25.0000 mg | ORAL_TABLET | Freq: Two times a day (BID) | ORAL | Status: DC
  Administered 2020-05-24 (×2): 25 mg via ORAL
  Filled 2020-05-24 (×2): qty 1

## 2020-05-24 MED ORDER — MAGIC MOUTHWASH W/LIDOCAINE
5.0000 mL | Freq: Three times a day (TID) | ORAL | Status: DC | PRN
  Administered 2020-05-28 (×2): 5 mL via ORAL
  Filled 2020-05-24 (×3): qty 5

## 2020-05-24 MED ORDER — SODIUM CHLORIDE 0.9 % IV SOLN: 250.0000 mL | INTRAVENOUS | Status: DC | PRN

## 2020-05-24 MED ORDER — SODIUM CHLORIDE 0.9% FLUSH
3.0000 mL | Freq: Two times a day (BID) | INTRAVENOUS | Status: DC
  Administered 2020-05-24 – 2020-05-30 (×10): 3 mL via INTRAVENOUS

## 2020-05-24 MED ORDER — METOPROLOL TARTRATE 12.5 MG HALF TABLET: 12.5000 mg | ORAL_TABLET | Freq: Two times a day (BID) | ORAL | Status: DC

## 2020-05-24 MED ORDER — ~~LOC~~ CARDIAC SURGERY, PATIENT & FAMILY EDUCATION: Freq: Once | Status: AC

## 2020-05-24 MED FILL — Heparin Sodium (Porcine) Inj 1000 Unit/ML: INTRAMUSCULAR | Qty: 20 | Status: AC

## 2020-05-24 MED FILL — Sodium Bicarbonate IV Soln 8.4%: INTRAVENOUS | Qty: 100 | Status: AC

## 2020-05-24 MED FILL — Heparin Sodium (Porcine) Inj 1000 Unit/ML: INTRAMUSCULAR | Qty: 30 | Status: AC

## 2020-05-24 MED FILL — Albumin, Human Inj 5%: INTRAVENOUS | Qty: 250 | Status: AC

## 2020-05-24 MED FILL — Potassium Chloride Inj 2 mEq/ML: INTRAVENOUS | Qty: 40 | Status: AC

## 2020-05-24 MED FILL — Lidocaine HCl Local Preservative Free (PF) Inj 2%: INTRAMUSCULAR | Qty: 15 | Status: AC

## 2020-05-24 MED FILL — Electrolyte-R (PH 7.4) Solution: INTRAVENOUS | Qty: 4000 | Status: AC

## 2020-05-24 MED FILL — Calcium Chloride Inj 10%: INTRAVENOUS | Qty: 10 | Status: AC

## 2020-05-24 MED FILL — Mannitol IV Soln 20%: INTRAVENOUS | Qty: 500 | Status: AC

## 2020-05-24 MED FILL — Sodium Chloride IV Soln 0.9%: INTRAVENOUS | Qty: 2000 | Status: AC

## 2020-05-24 NOTE — Progress Notes (Addendum)
CARDIAC REHAB PHASE I   PRE:  Rate/Rhythm: 84 SR    BP: sitting 109/72    SaO2: 94 RA  MODE:  Ambulation: 120 ft   POST:  Rate/Rhythm: 90 SR    BP: sitting 117/28     SaO2: 100 RA  Pt in recliner. Able to rock to stand. Slow and steady pace in hall with RW and gait belt, standby assist. Short steps. Fatigued with distance, also c/o slight dizziness. To bed after walk. VSS. Encouraged another walk after nap.  [id number]   [name] CES, ACSM 05/24/2020 2:50 PM

## 2020-05-24 NOTE — Progress Notes (Signed)
      [address]       [place] [address]             [phone]                 2 Days Post-Op Procedure(s) (LRB): CORONARY ARTERY BYPASS GRAFTING (CABG) x3 LIMA TO LAD, SVG TO OM1, SVG TO DISTAL RIGHT (N/A) TRANSESOPHAGEAL ECHOCARDIOGRAM (TEE) (N/A) Open Vein Harvest (Right)   Events: No events _______________________________________________________________ Vitals: BP 120/63   Pulse 87   Temp 98.1 F (36.7 C) (Oral)   Resp 17   Ht 4\' 11"  (1.499 m)   Wt 75.4 kg   SpO2 91%   BMI 33.57 kg/m   - Neuro: alert NAD  - Cardiovascular: sinus      - Pulm: clear    ABG    Component Value Date/Time   PHART 7.399 05/23/2020 0959   PCO2ART 35.4 05/23/2020 0959   PO2ART 92 05/23/2020 0959   HCO3 21.9 05/23/2020 0959   TCO2 23 05/23/2020 0959   ACIDBASEDEF 2.0 05/23/2020 0959   O2SAT 97.0 05/23/2020 0959    - Abd: soft - Extremity: warm  .Intake/Output      06/09 0701 - 06/10 0700 06/10 0701 - 06/11 0700   P.O. 340    I.V. (mL/kg) 411 (5.5)    Blood     Other 0    IV Piggyback 150    Total Intake(mL/kg) 901 (11.9)    Urine (mL/kg/hr) 930 (0.5)    Drains 10    Blood     Chest Tube 220    Total Output 1160    Net -259            _______________________________________________________________ Labs: CBC Latest Ref Rng & Units 05/24/2020 05/23/2020 05/23/2020  WBC 4.0 - 10.5 K/uL 16.6(H) 16.9(H) -  Hemoglobin 12.0 - 15.0 g/dL 10.0(L) 9.6(L) 9.9(L)  Hematocrit 36 - 46 % 31.0(L) 28.8(L) 29.0(L)  Platelets 150 - 400 K/uL 147(L) 129(L) -   CMP Latest Ref Rng & Units 05/23/2020 05/23/2020 05/23/2020  Glucose 70 - 99 mg/dL 07/23/2020) - -  BUN 8 - 23 mg/dL 19 - -  Creatinine 789(F - 1.00 mg/dL 8.10) - -  Sodium 1.75(Z - 145 mmol/L 137 142 142  Potassium 3.5 - 5.1 mmol/L 4.5 4.2 4.2  Chloride 98 - 111 mmol/L 106 - -  CO2 22 - 32 mmol/L 22 - -  Calcium 8.9 - 10.3 mg/dL 8.1(L) - -  Total Protein 6.5 - 8.1 g/dL - - -  Total Bilirubin 0.3 - 1.2 mg/dL - - -   Alkaline Phos 38 - 126 U/L - - -  AST 15 - 41 U/L - - -  ALT 0 - 44 U/L - - -    CXR: PV congestion  _______________________________________________________________  Assessment and Plan: POD 2 s/p CABG  Neuro: pain controlled CV: increase BB  On asp/statin.  Will remove drains Pulm: pulm toilet Renal: creat stable.  Gentle diuresis today GI: on diet Heme: stable ID: afebrile Endo: SSI  Dispo: floor today  025, MD 05/24/2020 8:00 AM

## 2020-05-24 NOTE — Progress Notes (Signed)
Patient arrived to 4E room 03 at this time. Telemetry applied and CCMD notified. CHG bath done. Mid sternal incision clean dry and intact with wound vac intact. V/s and assessment complete. Patient oriented to room and how to call nurse with any needs.

## 2020-05-24 NOTE — Plan of Care (Signed)
Continue to monitor

## 2020-05-24 NOTE — Evaluation (Signed)
Physical Therapy Evaluation Patient Details Name: [name] [name] MRN: [id number] DOB: 05/15/1941 Today's Date: 05/24/2020   History of Present Illness  Pt is a 79 y.o. female admitted for L heart cath and coronary angiography 05/21/20. S/P CABG 05/22/20, vent end date 06/08-06/09.  Left ICU to 4E 06/10. PMH OA, type 2 DM, renal insufficency, HTN, COPD, anxiety.  Clinical Impression  Pt presents with an overall decrease in functional mobility, decrease in cardiopulmonary endurance, generalized weakness, increased pain and decreased balance secondary to above. PTA, pt lives alone. Educ on sternal precautions with mobility and using pillow for braced coughing. Today, pt able to transfer sit to stand min(A) no DME. At baseline pt uses no DME, attempted to walk without DME with slowed gait, decreased step length and decreased stability with assistance for safety. Pt was slightly more stable with amb min(A) with use of rolling walker. Pt amb with an estimated gait speed of <1.31 m/s indicating high fall risk.  Pt required verbal cues to maintain and remind for sternal precautions. Discussed recommendations for SNF, pt agreed. Pt would benefit from continued acute PT services to maximize functional mobility and independence prior to d/c to next venue of care.      Follow Up Recommendations SNF    Equipment Recommendations  Rolling walker with 5" wheels    Recommendations for Other Services OT consult     Precautions / Restrictions Precautions Precautions: Sternal;Fall Precaution Comments: Educated Pt on move in the tube, wound vac Restrictions Weight Bearing Restrictions:  (Sternal precautions)      Mobility  Bed Mobility Overal bed mobility: Needs Assistance Bed Mobility: Supine to Sit     Supine to sit: HOB elevated;Mod assist     General bed mobility comments: Pt up to mod(A) for bed mobility, scooted to EOB with HOB elevated. Pt required mod(A) for torso elevation and scooting hips  to EOB.  Transfers Overall transfer level: Needs assistance Equipment used: None Transfers: Sit to/from Stand Sit to Stand: Min assist         General transfer comment: Pt required min(A) for sit to stand for saftey, stability and maintainng sternal precautions  Ambulation/Gait Ambulation/Gait assistance: Min assist Gait Distance (Feet): 120 Feet Assistive device: Rolling walker (2 wheeled) Gait Pattern/deviations: Step-through pattern;Decreased stride length;Decreased step length - right;Decreased step length - left;Trunk flexed;Wide base of support   Gait velocity interpretation: <1.31 ft/sec, indicative of household ambulator General Gait Details: Pt had decreased gait speed and very short step length with amb. Initally attempted to amb without RW, pt very unsteady and required walker for increased safety with amb.  Stairs            Wheelchair Mobility    Modified Rankin (Stroke Patients Only)       Balance Overall balance assessment: Needs assistance   Sitting balance-Leahy Scale: Fair Sitting balance - Comments: Pt unable to shift hips toward EOB saftey while maintaining precautions. Pt able to sit EOB and maintain balance without support     Standing balance-Leahy Scale: Fair Standing balance comment: Pt able to stand without RW, Pt unable to safely ambulate without RW at this time.                             Pertinent Vitals/Pain Pain Assessment: 0-10 Pain Score: 9  Pain Location: Chest with movement Pain Descriptors / Indicators: Discomfort;Grimacing;Guarding Pain Intervention(s): Limited activity within patient's tolerance;Monitored during session    Home  Living Family/patient expects to be discharged to:: Private residence Living Arrangements: Alone Available Help at Discharge: Family;Available PRN/intermittently Type of Home: House Home Access: Stairs to enter   Entrance Stairs-Number of Steps: 1 Home Layout: One level Home  Equipment: None Additional Comments: Pt potentially confused during session? Unsure if information gleaned was her home or sisters home    Prior Function Level of Independence: Independent               Hand Dominance   Dominant Hand: Right    Extremity/Trunk Assessment   Upper Extremity Assessment Upper Extremity Assessment: Defer to OT evaluation    Lower Extremity Assessment Lower Extremity Assessment: Generalized weakness    Cervical / Trunk Assessment Cervical / Trunk Assessment: Kyphotic  Communication   Communication: No difficulties  Cognition Arousal/Alertness: Awake/alert Behavior During Therapy: Flat affect Overall Cognitive Status: No family/caregiver present to determine baseline cognitive functioning Area of Impairment: Problem solving                             Problem Solving: Requires verbal cues;Requires tactile cues General Comments: Pt required verbal and tactile cues for sternal precautions, educated pt on sternal precautions and maintained during session with consistent reminders. Pt had trouble with home set up questions, but was alert and oriented x4.      General Comments General comments (skin integrity, edema, etc.): Pt wound vac appeared WNL pre and post session.    Exercises     Assessment/Plan    PT Assessment Patient needs continued PT services  PT Problem List Decreased strength;Decreased mobility;Decreased safety awareness;Decreased knowledge of precautions;Decreased activity tolerance;Decreased cognition;Cardiopulmonary status limiting activity;Decreased balance;Decreased knowledge of use of DME;Pain       PT Treatment Interventions DME instruction;Therapeutic activities;Cognitive remediation;Gait training;Therapeutic exercise;Patient/family education;Stair training;Balance training;Functional mobility training    PT Goals (Current goals can be found in the Care Plan section)  Acute Rehab PT Goals Patient Stated  Goal: Go to rehab PT Goal Formulation: With patient Time For Goal Achievement: 06/07/20 Potential to Achieve Goals: Good    Frequency Min 2X/week   Barriers to discharge Decreased caregiver support;Inaccessible home environment Pt reports she has family that can assist her, but not 24/7 and that her home is currently not accesible due to "clutter"    Co-evaluation               AM-PAC PT "6 Clicks" Mobility  Outcome Measure Help needed turning from your back to your side while in a flat bed without using bedrails?: A Little Help needed moving from lying on your back to sitting on the side of a flat bed without using bedrails?: A Lot Help needed moving to and from a bed to a chair (including a wheelchair)?: A Little Help needed standing up from a chair using your arms (e.g., wheelchair or bedside chair)?: A Little Help needed to walk in hospital room?: A Little Help needed climbing 3-5 steps with a railing? : A Lot 6 Click Score: 16    End of Session Equipment Utilized During Treatment: Gait belt Activity Tolerance: Patient tolerated treatment well Patient left: in chair;with call bell/phone within reach;with nursing/sitter in room;with chair alarm set Nurse Communication: Mobility status PT Visit Diagnosis: Unsteadiness on feet (R26.81);Pain;Muscle weakness (generalized) (M62.81) Pain - part of body:  (chest)    Time: 1610-9604 PT Time Calculation (min) (ACUTE ONLY): 26 min   Charges:   PT Evaluation $PT Eval Moderate Complexity:  1 Mod PT Treatments $Gait Training: 8-22 mins        [employer] SPT 05/24/2020   Sanjuana Letters 05/24/2020, 12:39 PM

## 2020-05-24 NOTE — Progress Notes (Signed)
Progress Note  Patient Name: [name] [name] Date of Encounter: 05/24/2020  [facility] Cardiologist: Dina Rich, MD   Subjective   Doing ok. Sitting up in chair eating breakfast. Expected chest soreness. No other complaints.  Inpatient Medications    Scheduled Meds:  acetaminophen  1,000 mg Oral Q6H   Or   acetaminophen (TYLENOL) oral liquid 160 mg/5 mL  1,000 mg Per Tube Q6H   aspirin EC  325 mg Oral Daily   Or   aspirin  324 mg Per Tube Daily   atorvastatin  40 mg Oral Daily   bisacodyl  10 mg Oral Daily   Or   bisacodyl  10 mg Rectal Daily   Chlorhexidine Gluconate Cloth  6 each Topical Daily   docusate sodium  200 mg Oral Daily   enoxaparin (LOVENOX) injection  30 mg Subcutaneous QHS   insulin aspart  0-24 Units Subcutaneous Q4H   insulin detemir  10 Units Subcutaneous Daily   mouth rinse  15 mL Mouth Rinse BID   pantoprazole  40 mg Oral Daily   sodium chloride flush  10-40 mL Intracatheter Q12H   sodium chloride flush  3 mL Intravenous Q12H   Continuous Infusions:  sodium chloride Stopped (05/23/20 1727)   sodium chloride     sodium chloride     sodium chloride Stopped (05/23/20 1727)   dexmedetomidine (PRECEDEX) IV infusion Stopped (05/23/20 0820)   DOBUTamine Stopped (05/23/20 1336)   insulin Stopped (05/23/20 1641)   lactated ringers     lactated ringers     lactated ringers Stopped (05/23/20 1727)   milrinone     nitroGLYCERIN Stopped (05/23/20 1338)   norepinephrine (LEVOPHED) Adult infusion Stopped (05/23/20 0843)   PRN Meds: sodium chloride, sodium chloride, dextrose, fentaNYL (SUBLIMAZE) injection, lactated ringers, midazolam, ondansetron (ZOFRAN) IV, oxyCODONE, sodium chloride flush, sodium chloride flush, traMADol   Vital Signs    Vitals:   05/24/20 0400 05/24/20 0500 05/24/20 0600 05/24/20 0645  BP: (!) 96/59 127/66 120/63   Pulse: 82 90 87   Resp: 17 (!) 24 17   Temp:    98.1 F (36.7 C)  TempSrc:     Oral  SpO2: 90% 93% 91%   Weight:  75.4 kg    Height:        Intake/Output Summary (Last 24 hours) at 05/24/2020 0723 Last data filed at 05/24/2020 0600 Gross per 24 hour  Intake 901 ml  Output 1160 ml  Net -259 ml   Last 3 Weights 05/24/2020 05/23/2020 05/22/2020  Weight (lbs) 166 lb 3.6 oz 164 lb 0.4 oz 154 lb 5.2 oz  Weight (kg) 75.4 kg 74.4 kg 70 kg      Telemetry    Sinus rhythm, rare PVC's, no brady events - Personally Reviewed   Physical Exam  Alert, oriented, in NAD GEN: No acute distress.   Neck: No JVD Cardiac: RRR, no murmurs, rubs, or gallops.  Respiratory: Clear to auscultation bilaterally. GI: Soft, nontender, non-distended  MS: mild diffuse edema; No deformity. Neuro:  Nonfocal  Psych: Normal affect   Labs    High Sensitivity Troponin:   Recent Labs  Lab 05/17/20 1538 05/17/20 1722 05/17/20 1951 05/18/20 1255 05/18/20 1452  TROPONINIHS 9 12 13 9 14       Chemistry Recent Labs  Lab 05/17/20 1539 05/20/20 0229 05/22/20 1904 05/22/20 2118 05/23/20 0500 05/23/20 0527 05/23/20 0827 05/23/20 0959 05/23/20 1733  NA  --    < > 139   < >  139   < > 142 142 137  K  --    < > 4.7   < > 4.1   < > 4.2 4.2 4.5  CL  --    < > 111  --  109  --   --   --  106  CO2  --    < > 20*  --  21*  --   --   --  22  GLUCOSE  --    < > 168*  --  153*  --   --   --  138*  BUN  --    < > 23  --  19  --   --   --  19  CREATININE  --    < > 1.11*  --  1.27*  --   --   --  1.32*  CALCIUM  --    < > 8.3*  --  8.2*  --   --   --  8.1*  PROT 7.0  --   --   --   --   --   --   --   --   ALBUMIN 3.8  --   --   --   --   --   --   --   --   AST 17  --   --   --   --   --   --   --   --   ALT 18  --   --   --   --   --   --   --   --   ALKPHOS 48  --   --   --   --   --   --   --   --   BILITOT 0.4  --   --   --   --   --   --   --   --   GFRNONAA  --    < > 48*  --  40*  --   --   --  39*  GFRAA  --    < > 55*  --  47*  --   --   --  45*  ANIONGAP  --    < > 8  --  9  --    --   --  9   < > = values in this interval not displayed.     Hematology Recent Labs  Lab 05/22/20 1904 05/22/20 2118 05/23/20 0500 05/23/20 0527 05/23/20 0827 05/23/20 0959 05/23/20 1733  WBC 15.8*  --  13.4*  --   --   --  16.9*  RBC 3.53*  --  3.44*  --   --   --  3.09*  HGB 11.0*   < > 10.6*   < > 9.9* 9.9* 9.6*  HCT 32.6*   < > 31.5*   < > 29.0* 29.0* 28.8*  MCV 92.4  --  91.6  --   --   --  93.2  MCH 31.2  --  30.8  --   --   --  31.1  MCHC 33.7  --  33.7  --   --   --  33.3  RDW 13.2  --  13.2  --   --   --  13.6  PLT 137*  --  140*  --   --   --  129*   < > = values in this  interval not displayed.    BNPNo results for input(s): BNP, PROBNP in the last 168 hours.   DDimer No results for input(s): DDIMER in the last 168 hours.   Radiology    DG Chest Port 1 View  Result Date: 05/23/2020 CLINICAL DATA:  Orogastric tube placement EXAM: PORTABLE CHEST 1 VIEW COMPARISON:  05/22/2020 at 1:29 p.m. FINDINGS: Support Apparatus: --Endotracheal tube: Tip at the level of the clavicular heads. --Enteric tube:Tip projects within the stomach --Catheter(s):Right internal jugular vein approach central venous catheter tip is at the cavoatrial junction. --Other: Mediastinal drain and chest tubes are unchanged. Unchanged left basilar atelectasis. IMPRESSION: 1. Endotracheal tube tip at the level of the clavicular heads. 2. Orogastric tube tip in the stomach. Electronically Signed   By: Deatra Robinson M.D.   On: 05/23/2020 03:05   DG Chest Port 1 View  Result Date: 05/22/2020 CLINICAL DATA:  Post CABG EXAM: PORTABLE CHEST 1 VIEW COMPARISON:  05/21/2020 FINDINGS: Endotracheal tube is approximately 3-4 cm above the carina. Right IJ central line tip overlies the SVC. Bilateral chest tubes are present. No pneumothorax. Low lung volumes. Left basilar atelectasis. Interval CABG. IMPRESSION: Lines and tubes as above. No pneumothorax. Left basilar atelectasis. Electronically Signed   By: Guadlupe Spanish  M.D.   On: 05/22/2020 13:53   ECHO INTRAOPERATIVE TEE  Result Date: 05/22/2020  *INTRAOPERATIVE TRANSESOPHAGEAL REPORT *  Patient Name:   [name] [name] Date of Exam: 05/22/2020 Medical Rec #:  [id number]      Height:       59.0 in Accession #:    [id number]     Weight:       154.3 lb Date of Birth:  11-Sep-1941     BSA:          1.65 m Patient Age:    78 years       BP:           102/68 mmHg Patient Gender: F              HR:           60 bpm. Exam Location:  Anesthesiology Transesophogeal exam was perform intraoperatively during surgical procedure. Patient was closely monitored under general anesthesia during the entirety of examination. Indications:     CAD Performing Phys: [id number] HARRELL O LIGHTFOOT Complications: No known complications during this procedure. POST-OP IMPRESSIONS - Left Ventricle: The left ventricle is unchanged from pre-bypass. - Right Ventricle: The right ventricle appears unchanged from pre-bypass. - Aorta: The aorta appears unchanged from pre-bypass. - Left Atrium: The left atrium appears unchanged from pre-bypass. - Left Atrial Appendage: The left atrial appendage appears unchanged from pre-bypass. - Aortic Valve: The aortic valve appears unchanged from pre-bypass. - Mitral Valve: The mitral valve appears unchanged from pre-bypass. - Tricuspid Valve: The tricuspid valve appears unchanged from pre-bypass. - Interatrial Septum: The interatrial septum appears unchanged from pre-bypass. - Interventricular Septum: The interventricular septum appears unchanged from pre-bypass. - Pericardium: The pericardium appears unchanged from pre-bypass. PRE-OP FINDINGS  Left Ventricle: The left ventricle has normal systolic function, with an ejection fraction of 55-60%. The cavity size was normal. There is mildly increased left ventricular wall thickness. No evidence of left ventricular regional wall motion abnormalities. Right Ventricle: The right ventricle has normal systolic function. The cavity was  normal. There is no increase in right ventricular wall thickness. Left Atrium: Left atrial size was normal in size. LAA velocity measured 27 cm/sec. The left atrial appendage is well visualized  and there is no evidence of thrombus present. Left atrial appendage velocity is reduced at less than 40 cm/s. Right Atrium: Right atrial size was normal in size. Right atrial pressure is estimated at 10 mmHg. Interatrial Septum: No atrial level shunt detected by color flow Doppler. Pericardium: There is no evidence of pericardial effusion. Mitral Valve: The mitral valve is normal in structure. Mitral valve regurgitation is mild by color flow Doppler. The MR jet is centrally-directed. There is No evidence of mitral stenosis. Tricuspid Valve: The tricuspid valve was normal in structure. Tricuspid valve regurgitation was not visualized by color flow Doppler. Aortic Valve: The aortic valve is normal in structure. Aortic valve regurgitation was not visualized by color flow Doppler. There is no evidence of aortic valve stenosis. Pulmonic Valve: The pulmonic valve was normal in structure. Pulmonic valve regurgitation is not visualized by color flow Doppler. Aorta: There is evidence of layered immobile plaque in the aortic arch and descending aorta; Grade III, measuring 3-54mm in size. Pulmonary Artery: The pulmonary artery is of normal size.  Hoy Morn MD Electronically signed by Hoy Morn MD Signature Date/Time: 05/22/2020/2:19:34 PM    Final     Patient Profile     79 y.o. 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 Unstable angina: POD#2 from CABG. 2. Acute on Chronic Kidney Injury, mild: creatinine 1.3 this am up from 1.0. Creat peaked at 1.64 preoperatively 3. DM, Type II: SSI, CBG range 99-211 last 24 hours 4.  Bradycardia: no further events since extubation. Should be fine to start low dose metoprolol.   Pt progressing well. Management per TCTS team.  For questions or updates, please contact [place] Please consult [email or web address] for contact info under     Signed, Sherren Mocha, MD  05/24/2020, 7:23 AM

## 2020-05-25 LAB — TYPE AND SCREEN
ABO/RH(D): A NEG
Antibody Screen: NEGATIVE
Unit division: 0
Unit division: 0
Unit division: 0
Unit division: 0
Unit division: 0
Unit division: 0

## 2020-05-25 LAB — BPAM RBC
Blood Product Expiration Date: 202106282359
Blood Product Expiration Date: 202106302359
Blood Product Expiration Date: 202106302359
Blood Product Expiration Date: 202106302359
Blood Product Expiration Date: 202107072359
Blood Product Expiration Date: 202107072359
ISSUE DATE / TIME: 202106080717
ISSUE DATE / TIME: 202106080717
ISSUE DATE / TIME: 202106081024
ISSUE DATE / TIME: 202106081024
Unit Type and Rh: 600
Unit Type and Rh: 600
Unit Type and Rh: 600
Unit Type and Rh: 600
Unit Type and Rh: 600
Unit Type and Rh: 600

## 2020-05-25 LAB — CBC
HCT: 32.6 % — ABNORMAL LOW (ref 36.0–46.0)
Hemoglobin: 10.4 g/dL — ABNORMAL LOW (ref 12.0–15.0)
MCH: 30.9 pg (ref 26.0–34.0)
MCHC: 31.9 g/dL (ref 30.0–36.0)
MCV: 96.7 fL (ref 80.0–100.0)
Platelets: 169 10*3/uL (ref 150–400)
RBC: 3.37 MIL/uL — ABNORMAL LOW (ref 3.87–5.11)
RDW: 13.2 % (ref 11.5–15.5)
WBC: 14.1 10*3/uL — ABNORMAL HIGH (ref 4.0–10.5)
nRBC: 0 % (ref 0.0–0.2)

## 2020-05-25 LAB — BASIC METABOLIC PANEL
Anion gap: 10 (ref 5–15)
BUN: 29 mg/dL — ABNORMAL HIGH (ref 8–23)
CO2: 25 mmol/L (ref 22–32)
Calcium: 8.4 mg/dL — ABNORMAL LOW (ref 8.9–10.3)
Chloride: 101 mmol/L (ref 98–111)
Creatinine, Ser: 1.6 mg/dL — ABNORMAL HIGH (ref 0.44–1.00)
GFR calc Af Amer: 35 mL/min — ABNORMAL LOW (ref >60)
GFR calc non Af Amer: 31 mL/min — ABNORMAL LOW (ref >60)
Glucose, Bld: 143 mg/dL — ABNORMAL HIGH (ref 70–99)
Potassium: 4.7 mmol/L (ref 3.5–5.1)
Sodium: 136 mmol/L (ref 135–145)

## 2020-05-25 LAB — GLUCOSE, CAPILLARY
Glucose-Capillary: 136 mg/dL — ABNORMAL HIGH (ref 70–99)
Glucose-Capillary: 154 mg/dL — ABNORMAL HIGH (ref 70–99)
Glucose-Capillary: 158 mg/dL — ABNORMAL HIGH (ref 70–99)
Glucose-Capillary: 171 mg/dL — ABNORMAL HIGH (ref 70–99)
Glucose-Capillary: 190 mg/dL — ABNORMAL HIGH (ref 70–99)
Glucose-Capillary: 195 mg/dL — ABNORMAL HIGH (ref 70–99)
Glucose-Capillary: 218 mg/dL — ABNORMAL HIGH (ref 70–99)

## 2020-05-25 MED ORDER — METOPROLOL TARTRATE 12.5 MG HALF TABLET
12.5000 mg | ORAL_TABLET | Freq: Two times a day (BID) | ORAL | Status: DC
  Administered 2020-05-25 – 2020-06-01 (×15): 12.5 mg via ORAL
  Filled 2020-05-25 (×15): qty 1

## 2020-05-25 NOTE — Progress Notes (Signed)
Occupational Therapy Evaluation Patient Details Name: [name] [name] MRN: [id number] DOB: 03-09-1941 Today's Date: 05/25/2020    History of Present Illness Pt is a 79 y.o. female admitted for L heart cath and coronary angiography 05/21/20. S/P CABG 05/22/20, vent end date 06/08-06/09.  Left ICU to 4E 06/10. PMH OA, type 2 DM, renal insufficency, HTN, COPD, anxiety.   Clinical Impression   PTA pt lived alone at home and was independent with ADL and mobility. Pt currently requires min A for mobility and mod A with ADL tasks. Recommend rehab at SNF to facilitate return home @ modified independent level. VSS during session however on entry to room, pt asleep and SpO2 85 on RA with good pleth. Will follow acutely to progress rehab and maximize functional level of independence.     Follow Up Recommendations  SNF;Supervision/Assistance - 24 hour    Equipment Recommendations  3 in 1 bedside commode    Recommendations for Other Services       Precautions / Restrictions Precautions Precautions: Sternal;Fall Precaution Comments: wound vac Restrictions Other Position/Activity Restrictions: sternal      Mobility Bed Mobility Overal bed mobility: Needs Assistance Bed Mobility: Supine to Sit;Sit to Supine     Supine to sit: Mod assist Sit to supine: Mod assist      Transfers Overall transfer level: Needs assistance   Transfers: Sit to/from Stand Sit to Stand: Min assist              Balance Overall balance assessment: Needs assistance   Sitting balance-Leahy Scale: Good       Standing balance-Leahy Scale: Fair                             ADL either performed or assessed with clinical judgement   ADL Overall ADL's : Needs assistance/impaired     Grooming: Set up;Sitting   Upper Body Bathing: Minimal assistance;Sitting   Lower Body Bathing: Moderate assistance;Sit to/from stand   Upper Body Dressing : Moderate assistance;Sitting   Lower Body  Dressing: Moderate assistance;Sit to/from stand   Toilet Transfer: Minimal assistance;BSC;Stand-pivot   Toileting- Water quality scientist and Hygiene: Moderate assistance;Sit to/from stand       Functional mobility during ADLs: Minimal assistance;Cueing for safety;Cueing for sequencing General ADL Comments: Began educating on sternal precautions     Vision         Perception     Praxis      Pertinent Vitals/Pain Pain Assessment: Faces Faces Pain Scale: Hurts little more Pain Location: Chest with movement Pain Descriptors / Indicators: Discomfort;Grimacing;Guarding Pain Intervention(s): Limited activity within patient's tolerance     Hand Dominance Right   Extremity/Trunk Assessment Upper Extremity Assessment Upper Extremity Assessment: Generalized weakness   Lower Extremity Assessment Lower Extremity Assessment: Defer to PT evaluation   Cervical / Trunk Assessment Cervical / Trunk Assessment: Other exceptions (forward head)   Communication Communication Communication: No difficulties   Cognition Arousal/Alertness: Awake/alert Behavior During Therapy: Flat affect Overall Cognitive Status: No family/caregiver present to determine baseline cognitive functioning Area of Impairment: Attention;Problem solving;Awareness                   Current Attention Level: Selective       Awareness: Emergent Problem Solving: Slow processing General Comments: Will continue to assess   General Comments       Exercises     Shoulder Instructions      Home Living Family/patient expects to  be discharged to:: Skilled nursing facility                                        Prior Functioning/Environment Level of Independence: Independent                 OT Problem List: Decreased strength;Decreased activity tolerance;Impaired balance (sitting and/or standing);Decreased cognition;Decreased safety awareness;Decreased knowledge of use of DME or  AE;Decreased knowledge of precautions;Obesity;Pain      OT Treatment/Interventions: Self-care/ADL training;Therapeutic exercise;Neuromuscular education;Energy conservation;DME and/or AE instruction;Therapeutic activities;Cognitive remediation/compensation;Patient/family education;Balance training    OT Goals(Current goals can be found in the care plan section) Acute Rehab OT Goals Patient Stated Goal: Go to rehab OT Goal Formulation: With patient Time For Goal Achievement: 06/08/20 Potential to Achieve Goals: Good  OT Frequency: Min 2X/week   Barriers to D/C:            Co-evaluation              AM-PAC OT "6 Clicks" Daily Activity     Outcome Measure Help from another person eating meals?: None Help from another person taking care of personal grooming?: A Little Help from another person toileting, which includes using toliet, bedpan, or urinal?: A Lot Help from another person bathing (including washing, rinsing, drying)?: A Lot Help from another person to put on and taking off regular upper body clothing?: A Lot Help from another person to put on and taking off regular lower body clothing?: A Lot 6 Click Score: 15   End of Session Equipment Utilized During Treatment: Oxygen Nurse Communication: Mobility status  Activity Tolerance: Patient tolerated treatment well Patient left: in bed;with call bell/phone within reach;with bed alarm set  OT Visit Diagnosis: Unsteadiness on feet (R26.81);Other abnormalities of gait and mobility (R26.89);Muscle weakness (generalized) (M62.81);Pain;Other symptoms and signs involving cognitive function Pain - part of body:  (chest)                Time: [id number] OT Time Calculation (min): 33 min Charges:  OT General Charges $OT Visit: 1 Visit OT Evaluation $OT Eval Moderate Complexity: 1 Mod OT Treatments $Self Care/Home Management : 8-22 mins  Luisa Dago, OT/L   Acute OT Clinical Specialist Acute Rehabilitation Services Pager  [phone] Office [phone]   [facility] 05/25/2020, 4:24 PM

## 2020-05-25 NOTE — Progress Notes (Signed)
Mobility Specialist - Progress Note   05/25/20 1326  Mobility  Activity Transferred:  Chair to bed  Assistive Device Front wheel walker  Distance Ambulated (ft) 15 ft  Mobility Response Tolerated fair  Mobility performed by Mobility specialist  $Mobility charge 1 Mobility    Pre-mobility: 87 HR, 112/58 BP, 98% SpO2 Post-mobility: 82 HR, 119/58 BP, 99% SPO2  Pt says she has been feeling nauseous all day, when we began walking she began c/o worsening dizziness and stated that she didn't feel like she could keep walking and requested to lay in bed instead. Pt safely transferred to bed, but I saw mild bleeding on her R lower leg under her guaze pad. Nurse was notified.  Mamie Levers Mobility Specialist

## 2020-05-25 NOTE — Progress Notes (Signed)
Noted pt tolerated poorly with MS this pm. No time for her to rest and try to ambulate again. Will f/u tomorrow. Ethelda Chick CES, ACSM 3:28 PM 05/25/2020

## 2020-05-25 NOTE — Progress Notes (Addendum)
      [address]       [name] [name] [address]             [phone]        3 Days Post-Op Procedure(s) (LRB): CORONARY ARTERY BYPASS GRAFTING (CABG) x3 LIMA TO LAD, SVG TO OM1, SVG TO DISTAL RIGHT (N/A) TRANSESOPHAGEAL ECHOCARDIOGRAM (TEE) (N/A) Open Vein Harvest (Right)  Subjective: Patient just waking up this am She has no specific complaints.  Objective: Vital signs in last 24 hours: Temp:  [97.5 F (36.4 C)-98.6 F (37 C)] 97.5 F (36.4 C) (06/11 0416) Pulse Rate:  [67-107] 67 (06/11 0416) Cardiac Rhythm: Normal sinus rhythm (06/11 0115) Resp:  [14-21] 21 (06/11 0416) BP: (106-146)/(53-76) 106/76 (06/11 0416) SpO2:  [89 %-100 %] 100 % (06/11 0000) Weight:  [74.4 kg] 74.4 kg (06/11 0416)  Pre op weight 70 kg Current Weight  05/25/20 74.4 kg      Intake/Output from previous day: 06/10 0701 - 06/11 0700 In: 920 [P.O.:920] Out: 1000 [Urine:1000]   Physical Exam:  Cardiovascular: RRR Pulmonary: Slightly diminished bibasilar breath sounds Abdomen: Soft, non tender, bowel sounds present. Extremities: Mild bilateral lower extremity edema. Wounds: RLE wounds are clean and dry.  No erythema or signs of infection. Pravena wound vac in place on sternum  Lab Results: CBC: Recent Labs    05/24/20 0617 05/25/20 0420  WBC 16.6* 14.1*  HGB 10.0* 10.4*  HCT 31.0* 32.6*  PLT 147* 169   BMET:  Recent Labs    05/24/20 0617 05/25/20 0420  NA 135 136  K 4.4 4.7  CL 105 101  CO2 23 25  GLUCOSE 164* 143*  BUN 22 29*  CREATININE 1.41* 1.60*  CALCIUM 8.3* 8.4*    PT/INR:  Lab Results  Component Value Date   INR 1.3 (H) 05/22/2020   INR 1.0 05/21/2020   ABG:  INR: Will add last result for INR, ABG once components are confirmed Will add last 4 CBG results once components are confirmed  Assessment/Plan:  1. CV - SR with HR in the 60's. On Lopressor 25 mg bid but will decrease because of HR and BP. 2.  Pulmonary - On 2 liters oxygen via  [place]. 3. Volume Overload - Will hold Lasix today as creatinine increased. 4.  Expected post op acute blood loss anemia - H and H this am stable at 10.4 and 32.6 5. CBGs 218/158/154. On Insulin. Pre op HGA1C 7.4. Will restart Janumet at discharge 6. Creatinine this am up to 1.6. Hold diuresis and re check in am. She is not on ACE 7. Patient states she will need short term rehab when ready for discharge. Will place consults  [name] [name] [facility] 05/25/2020,7:00 AM   Agree with above. Patient doing well and working with physical therapy. Creatinine slightly up but patient is auto diuresing Stable blood pressure and heart rate. Dispo planning.  [name] [name] Scrape

## 2020-05-25 NOTE — Discharge Instructions (Signed)
We ask the SNF to please do the following: 1. Please obtain vital signs at least one time daily 2.Please weigh the patient daily. If he or she continues to gain weight or develops lower extremity edema, contact the office at [phone]. 3. Ambulate patient at least three times daily and please use sternal precautions.  Discharge Instructions:  1. You may shower, please wash incisions daily with soap and water and keep dry.  If you wish to cover wounds with dressing you may do so but please keep clean and change daily.  No tub baths or swimming until incisions have completely healed.  If your incisions become red or develop any drainage please call our office at [phone]  2. No Driving until cleared by Dr. Lightfoot's office and you are no longer using narcotic pain medications  3. Monitor your weight daily.. Please use the same scale and weigh at same time... If you gain 5-10 lbs in 48 hours with associated lower extremity swelling, please contact our office at [phone]  4. Fever of 101.5 for at least 24 hours with no source, please contact our office at [phone]  5. Activity- up as tolerated, please walk at least 3 times per day.  Avoid strenuous activity, no lifting, pushing, or pulling with your arms over 8-10 lbs for a minimum of 6 weeks  6. If any questions or concerns arise, please do not hesitate to contact our office at [phone]  

## 2020-05-25 NOTE — Care Management Important Message (Signed)
Important Message  Patient Details  Name: [name] [name] MRN: [id number] Date of Birth: 04-11-1941   Medicare Important Message Given:  Yes     Renie Ora 05/25/2020, 9:48 AM

## 2020-05-26 ENCOUNTER — Inpatient Hospital Stay (HOSPITAL_COMMUNITY): Payer: Medicare Other

## 2020-05-26 LAB — GLUCOSE, CAPILLARY
Glucose-Capillary: 127 mg/dL — ABNORMAL HIGH (ref 70–99)
Glucose-Capillary: 277 mg/dL — ABNORMAL HIGH (ref 70–99)
Glucose-Capillary: 170 mg/dL — ABNORMAL HIGH (ref 70–99)
Glucose-Capillary: 207 mg/dL — ABNORMAL HIGH (ref 70–99)

## 2020-05-26 LAB — BASIC METABOLIC PANEL
Anion gap: 10 (ref 5–15)
BUN: 31 mg/dL — ABNORMAL HIGH (ref 8–23)
CO2: 22 mmol/L (ref 22–32)
Calcium: 8.4 mg/dL — ABNORMAL LOW (ref 8.9–10.3)
Chloride: 107 mmol/L (ref 98–111)
Creatinine, Ser: 1.68 mg/dL — ABNORMAL HIGH (ref 0.44–1.00)
GFR calc Af Amer: 33 mL/min — ABNORMAL LOW (ref >60)
GFR calc non Af Amer: 29 mL/min — ABNORMAL LOW (ref >60)
Glucose, Bld: 113 mg/dL — ABNORMAL HIGH (ref 70–99)
Potassium: 4.8 mmol/L (ref 3.5–5.1)
Sodium: 139 mmol/L (ref 135–145)

## 2020-05-26 MED ORDER — INSULIN DETEMIR 100 UNIT/ML ~~LOC~~ SOLN
14.0000 [IU] | Freq: Every day | SUBCUTANEOUS | Status: DC
  Administered 2020-05-26: 14 [IU] via SUBCUTANEOUS
  Filled 2020-05-26 (×2): qty 0.14

## 2020-05-26 MED ORDER — INSULIN ASPART 100 UNIT/ML ~~LOC~~ SOLN
0.0000 [IU] | Freq: Three times a day (TID) | SUBCUTANEOUS | Status: DC
  Administered 2020-05-27: 12 [IU] via SUBCUTANEOUS
  Administered 2020-05-27: 2 [IU] via SUBCUTANEOUS
  Administered 2020-05-27: 4 [IU] via SUBCUTANEOUS
  Administered 2020-05-28 (×3): 8 [IU] via SUBCUTANEOUS
  Administered 2020-05-29 – 2020-05-30 (×4): 4 [IU] via SUBCUTANEOUS
  Administered 2020-05-30: 12 [IU] via SUBCUTANEOUS
  Administered 2020-05-31: 4 [IU] via SUBCUTANEOUS
  Administered 2020-05-31: 8 [IU] via SUBCUTANEOUS
  Administered 2020-05-31: 4 [IU] via SUBCUTANEOUS
  Administered 2020-06-01: 3 [IU] via SUBCUTANEOUS
  Administered 2020-06-01: 8 [IU] via SUBCUTANEOUS

## 2020-05-26 MED ORDER — INSULIN ASPART 100 UNIT/ML ~~LOC~~ SOLN
0.0000 [IU] | Freq: Every day | SUBCUTANEOUS | Status: DC
  Administered 2020-05-26 – 2020-05-30 (×2): 2 [IU] via SUBCUTANEOUS

## 2020-05-26 NOTE — Progress Notes (Signed)
CARDIAC REHAB PHASE I   PRE:  Rate/Rhythm: 87 SR    BP: sitting 125/50    SaO2: 99 2L, 94 RA  MODE:  Ambulation: 280 ft   POST:  Rate/Rhythm: 112 ST    BP: sitting 139/56     SaO2: 97 RA  Pt feeling better today. Mod assist to get out of bed (turn on side). Able to stand with rocking. Used RW in hall, standby assist. Increased distance. Slow pace. Rest x2 due to fatigue. To recliner. No O2 needed walking. Practiced IS, 250-500 mL. She is now thinking of d/c with her family, calling them now. Discussed IS, sternal precautions, walking, and CRPII. Gave diet sheets. Not interested in CRPII. Encouraged more IS and walking. [id number]   [name] CES, ACSM 05/26/2020 11:17 AM

## 2020-05-26 NOTE — Progress Notes (Addendum)
[address]       [place] [address]             [phone]      4 Days Post-Op Procedure(s) (LRB): CORONARY ARTERY BYPASS GRAFTING (CABG) x3 LIMA TO LAD, SVG TO OM1, SVG TO DISTAL RIGHT (N/A) TRANSESOPHAGEAL ECHOCARDIOGRAM (TEE) (N/A) Open Vein Harvest (Right) Subjective: Nausea yesterday,  resolved, slowly feeling stronger  Objective: Vital signs in last 24 hours: Temp:  [97.6 F (36.4 C)-98.6 F (37 C)] 97.7 F (36.5 C) (06/12 0424) Pulse Rate:  [67-88] 86 (06/11 2054) Cardiac Rhythm: Normal sinus rhythm (06/12 0424) Resp:  [15-20] 17 (06/11 1615) BP: (108-128)/(58-85) 108/60 (06/12 0424) SpO2:  [95 %-100 %] 96 % (06/12 0424)  Hemodynamic parameters for last 24 hours:    Intake/Output from previous day: 06/11 0701 - 06/12 0700 In: 360 [P.O.:360] Out: 900 [Urine:900] Intake/Output this shift: No intake/output data recorded.  General appearance: alert, cooperative, fatigued and no distress Heart: regular rate and rhythm Lungs: mildly dim in bases Abdomen: nontender Extremities: no significant edema Wound: incis healing well  Lab Results: Recent Labs    05/24/20 0617 05/25/20 0420  WBC 16.6* 14.1*  HGB 10.0* 10.4*  HCT 31.0* 32.6*  PLT 147* 169   BMET:  Recent Labs    05/25/20 0420 05/26/20 0327  NA 136 139  K 4.7 4.8  CL 101 107  CO2 25 22  GLUCOSE 143* 113*  BUN 29* 31*  CREATININE 1.60* 1.68*  CALCIUM 8.4* 8.4*    PT/INR: No results for input(s): LABPROT, INR in the last 72 hours. ABG    Component Value Date/Time   PHART 7.399 05/23/2020 0959   HCO3 21.9 05/23/2020 0959   TCO2 23 05/23/2020 0959   ACIDBASEDEF 2.0 05/23/2020 0959   O2SAT 97.0 05/23/2020 0959   CBG (last 3)  Recent Labs    05/25/20 2123 05/25/20 2344 05/26/20 0425  GLUCAP 195* 171* 127*    Meds Scheduled Meds: . acetaminophen  1,000 mg Oral Q6H   Or  . acetaminophen (TYLENOL) oral liquid 160 mg/5 mL  1,000 mg Per Tube Q6H  . aspirin  EC  325 mg Oral Daily   Or  . aspirin  324 mg Per Tube Daily  . atorvastatin  40 mg Oral Daily  . bisacodyl  10 mg Oral Daily   Or  . bisacodyl  10 mg Rectal Daily  . Chlorhexidine Gluconate Cloth  6 each Topical Daily  . docusate sodium  200 mg Oral Daily  . enoxaparin (LOVENOX) injection  30 mg Subcutaneous QHS  . insulin aspart  0-24 Units Subcutaneous Q4H  . insulin detemir  10 Units Subcutaneous Daily  . mouth rinse  15 mL Mouth Rinse BID  . metoprolol tartrate  12.5 mg Oral BID  . pantoprazole  40 mg Oral Daily  . sodium chloride flush  10-40 mL Intracatheter Q12H  . sodium chloride flush  3 mL Intravenous Q12H  . sodium chloride flush  3 mL Intravenous Q12H   Continuous Infusions: . sodium chloride Stopped (05/23/20 1727)  . sodium chloride    . sodium chloride    . sodium chloride Stopped (05/23/20 1727)  . sodium chloride    . lactated ringers    . lactated ringers    . lactated ringers Stopped (05/23/20 1727)   PRN Meds:.sodium chloride, sodium chloride, sodium chloride, dextrose, lactated ringers, magic mouthwash w/lidocaine, menthol-cetylpyridinium, midazolam, ondansetron (ZOFRAN) IV, oxyCODONE, sodium chloride flush,  sodium chloride flush, sodium chloride flush, traMADol  Xrays No results found.  Assessment/Plan: S/P Procedure(s) (LRB): CORONARY ARTERY BYPASS GRAFTING (CABG) x3 LIMA TO LAD, SVG TO OM1, SVG TO DISTAL RIGHT (N/A) TRANSESOPHAGEAL ECHOCARDIOGRAM (TEE) (N/A) Open Vein Harvest (Right)  1 overall steady progress, POD#4 2 vitals stable, sinus rhythm with PVC's, no fevers, K+ ok/ MG++ ok 3 sats good on 2 liter, cont to wean, routine pulm toilet 5 creat stable at 1.68, adeq UOP with spontaneous diuresis, holding diuretics currently 6 BS control is fair- home Janumet at discharge if creat allows, cont SSI/detemir- increase a little 7 routine rehab 8 SNF at discharge, SW on board  LOS: 8 days    Rowe Clack PA-C Pager [phone] 05/26/2020  Patient seen and examined, agree with above [facility] 280' with assistance Still requires a lot of assistance with mobility  Viviann Spare C. Dorris Fetch, MD [facility] [phone]

## 2020-05-26 NOTE — Progress Notes (Signed)
Mobility Specialist - Progress Note   05/26/20 1339  Mobility  Activity Ambulated in room;Transferred:  Chair to bed  Level of Assistance Modified independent, requires aide device or extra time  Assistive Device Front wheel walker  Distance Ambulated (ft) 40 ft  Mobility Response Tolerated fair  Mobility performed by Mobility specialist  $Mobility charge 1 Mobility    Pre-mobility: 82 HR, 105/80 BP, 96% SpO2 Post-mobility: 82 HR, 118/49 BP, 94% SpO2  Pt c/o dizziness that worsened with activity, she said it was improved compared to yesterday.   Mamie Levers Mobility Specialist

## 2020-05-26 NOTE — Progress Notes (Signed)
Spoke with Jacques Earthly and she gave me a verbal order to change S.S.Insulin coverage to ac and hs with h.s. sliding scale. CBG ac and  Hs. Ask Arlyce Dice. to put S.S. order in computer . Lane Hacker. charge nurse aware Also. Late Entry

## 2020-05-27 LAB — GLUCOSE, CAPILLARY
Glucose-Capillary: 151 mg/dL — ABNORMAL HIGH (ref 70–99)
Glucose-Capillary: 252 mg/dL — ABNORMAL HIGH (ref 70–99)
Glucose-Capillary: 181 mg/dL — ABNORMAL HIGH (ref 70–99)
Glucose-Capillary: 172 mg/dL — ABNORMAL HIGH (ref 70–99)

## 2020-05-27 LAB — BASIC METABOLIC PANEL
Anion gap: 7 (ref 5–15)
BUN: 26 mg/dL — ABNORMAL HIGH (ref 8–23)
CO2: 25 mmol/L (ref 22–32)
Calcium: 8.6 mg/dL — ABNORMAL LOW (ref 8.9–10.3)
Chloride: 106 mmol/L (ref 98–111)
Creatinine, Ser: 1.64 mg/dL — ABNORMAL HIGH (ref 0.44–1.00)
GFR calc Af Amer: 34 mL/min — ABNORMAL LOW (ref >60)
GFR calc non Af Amer: 30 mL/min — ABNORMAL LOW (ref >60)
Glucose, Bld: 169 mg/dL — ABNORMAL HIGH (ref 70–99)
Potassium: 5 mmol/L (ref 3.5–5.1)
Sodium: 138 mmol/L (ref 135–145)

## 2020-05-27 MED ORDER — INSULIN DETEMIR 100 UNIT/ML ~~LOC~~ SOLN
14.0000 [IU] | Freq: Every day | SUBCUTANEOUS | Status: DC
  Administered 2020-05-27 – 2020-05-30 (×4): 14 [IU] via SUBCUTANEOUS
  Filled 2020-05-27 (×5): qty 0.14

## 2020-05-27 MED ORDER — MAGNESIUM HYDROXIDE 400 MG/5ML PO SUSP
30.0000 mL | Freq: Every day | ORAL | Status: DC | PRN
  Filled 2020-05-27: qty 30

## 2020-05-27 MED ORDER — INSULIN DETEMIR 100 UNIT/ML ~~LOC~~ SOLN
20.0000 [IU] | Freq: Every day | SUBCUTANEOUS | Status: DC
  Filled 2020-05-27: qty 0.2

## 2020-05-27 MED ORDER — LINAGLIPTIN 5 MG PO TABS
5.0000 mg | ORAL_TABLET | Freq: Every day | ORAL | Status: DC
  Administered 2020-05-27 – 2020-06-01 (×7): 5 mg via ORAL
  Filled 2020-05-27 (×7): qty 1

## 2020-05-27 NOTE — Progress Notes (Signed)
Inpatient Diabetes Program Recommendations  AACE/ADA: New Consensus Statement on Inpatient Glycemic Control (2015)  Target Ranges:  Prepandial:   less than 140 mg/dL      Peak postprandial:   less than 180 mg/dL (1-2 hours)      Critically ill patients:  140 - 180 mg/dL   Lab Results  Component Value Date   GLUCAP 151 (H) 05/27/2020   HGBA1C 7.4 (H) 05/21/2020    Review of Glycemic Control Results for JASELYNN, TAMAS (MRN [id number]) as of 05/27/2020 09:21  Ref. Range 05/26/2020 04:25 05/26/2020 11:05 05/26/2020 16:16 05/26/2020 21:13 05/27/2020 06:07  Glucose-Capillary Latest Ref Range: 70 - 99 mg/dL 756 (H) 433 (H) 295 (H) 207 (H) 151 (H)   Diabetes history: DM 2 Outpatient Diabetes medications: Janumet 50-1000 mg bid Current orders for Inpatient glycemic control:  Levemir 14 units Novolog 0-24 units tid + hs scale  A1c 7.4% on 6/7  Consult for medication outpatient  Inpatient Diabetes Program Recommendations:    Outpatient recs: regarding renal impairment, consider Tradjenta 5 mg Daily.  Discussed plan of care with Gershon Crane, PA. May need meal coverage t [prevent post prandial spikes. Starting tradjenta inpatient and follow trends to be able to transition toward d/c.  Thanks, Christena Deem RN, MSN, BC-ADM Inpatient Diabetes Coordinator Team Pager [phone] (8a-5p)

## 2020-05-27 NOTE — NC FL2 (Signed)
[place] LEVEL OF CARE SCREENING TOOL     IDENTIFICATION  Patient Name: [name] [name] Birthdate: [birth year] Sex: female Admission Date (Current Location): 05/17/2020  [facility] and [place] Number:  Herbalist and Address:  [facility], [place] [address] [place], [place] [address]      Provider Number: [id number]  Attending Physician Name and Address:  Lajuana Matte, MD  Relative Name and Phone Number:  Cindy [place]: Hospital Recommended Level of Care: [place] Prior Approval Number:    Date Approved/Denied:   PASRR Number: Pending  Discharge Plan: SNF    Current Diagnoses: Patient Active Problem List   Diagnosis Date Noted  . Abnormal cardiovascular stress test 05/18/2020  . Anxiety disorder, unspecified   . Chronic obstructive pulmonary disease, unspecified ([place])   . Gastro-esophageal reflux disease with esophagitis   . Major depressive disorder, single episode, unspecified   . Type 2 diabetes mellitus with hyperglycemia ([place])   . Unspecified osteoarthritis, unspecified site   . Anemia, unspecified   . Atherosclerotic heart disease of native coronary artery without angina pectoris   . Hypertensive chronic kidney disease with stage 1 through stage 4 chronic kidney disease, or unspecified chronic kidney disease   . Hypertensive heart disease without heart failure   . Type 2 diabetes mellitus with diabetic nephropathy ([place])   . Unspecified abdominal hernia without obstruction or gangrene   . Ventral hernia without obstruction or gangrene 09/01/2017  . Incarcerated ventral hernia   . Chest pain 06/11/2016  . Diabetes mellitus ([place]) 06/13/2009  . GERD 06/13/2009    Orientation RESPIRATION BLADDER Height & Weight     Self, Time, Situation, Place  O2 (2l) Continent Weight: 161 lb 6.4 oz (73.2 kg) Height:  4\' 11"  (149.9 cm)  BEHAVIORAL SYMPTOMS/MOOD NEUROLOGICAL BOWEL  NUTRITION STATUS      Continent Diet (see discharge summary)  AMBULATORY STATUS COMMUNICATION OF NEEDS Skin   Limited Assist Verbally Surgical wounds (left leg)                       Personal Care Assistance Level of Assistance  Bathing, Feeding, Dressing Bathing Assistance: Independent Feeding assistance: Limited assistance Dressing Assistance: Limited assistance     Functional Limitations Info  Sight, Hearing, Speech Sight Info: Impaired Hearing Info: Adequate Speech Info: Adequate    SPECIAL CARE FACTORS FREQUENCY  PT (By licensed PT), OT (By licensed OT)     PT Frequency: 5x per week OT Frequency: 5x per week            Contractures Contractures Info: Not present    Additional Factors Info  Insulin Sliding Scale, Code Status, Allergies Code Status Info: Full Allergies Info: Contrast Media (Iodinated Diagnostic Agents), Morphine And Related, Other, Penicillins, Codeine   Insulin Sliding Scale Info: insulin aspart (novoLOG) injection 0-24 Units ,insulin detemir (LEVEMIR) injection 14 Units       Current Medications (05/27/2020):  This is the current hospital active medication list Current Facility-Administered Medications  Medication Dose Route Frequency Provider Last Rate Last Admin  . 0.45 % sodium chloride infusion   Intravenous Continuous PRN Barrett, Erin R, PA-C   Stopped at 05/23/20 1727  . 0.9 %  sodium chloride infusion  250 mL Intravenous Continuous Barrett, Erin R, PA-C      . 0.9 %  sodium chloride infusion   Intravenous Continuous Barrett, Erin R,  PA-C      . 0.9 %  sodium chloride infusion   Intravenous PRN Corliss Skains, MD   Stopped at 05/23/20 1727  . 0.9 %  sodium chloride infusion  250 mL Intravenous PRN Lightfoot, Eliezer Lofts, MD      . acetaminophen (TYLENOL) tablet 1,000 mg  1,000 mg Oral Q6H Barrett, Erin R, PA-C   1,000 mg at 05/27/20 3570   Or  . acetaminophen (TYLENOL) 160 MG/5ML solution 1,000 mg  1,000 mg Per Tube Q6H Barrett,  Erin R, PA-C   1,000 mg at 05/23/20 1779  . aspirin EC tablet 325 mg  325 mg Oral Daily Barrett, Erin R, PA-C   325 mg at 05/27/20 1028   Or  . aspirin chewable tablet 324 mg  324 mg Per Tube Daily Barrett, Erin R, PA-C      . atorvastatin (LIPITOR) tablet 40 mg  40 mg Oral Daily Barrett, Erin R, PA-C   40 mg at 05/27/20 1023  . bisacodyl (DULCOLAX) EC tablet 10 mg  10 mg Oral Daily Barrett, Erin R, PA-C   10 mg at 05/26/20 0931   Or  . bisacodyl (DULCOLAX) suppository 10 mg  10 mg Rectal Daily Barrett, Erin R, PA-C      . Chlorhexidine Gluconate Cloth 2 % PADS 6 each  6 each Topical Daily Corliss Skains, MD   6 each at 05/27/20 1025  . dextrose 50 % solution 0-50 mL  0-50 mL Intravenous PRN Barrett, Erin R, PA-C      . docusate sodium (COLACE) capsule 200 mg  200 mg Oral Daily Barrett, Erin R, PA-C   200 mg at 05/27/20 1022  . enoxaparin (LOVENOX) injection 30 mg  30 mg Subcutaneous QHS Corliss Skains, MD   30 mg at 05/26/20 2138  . insulin aspart (novoLOG) injection 0-24 Units  0-24 Units Subcutaneous TID WC Ardelle Balls, PA-C   2 Units at 05/27/20 3903  . insulin aspart (novoLOG) injection 0-5 Units  0-5 Units Subcutaneous QHS Ardelle Balls, PA-C   2 Units at 05/26/20 2143  . insulin detemir (LEVEMIR) injection 14 Units  14 Units Subcutaneous Daily Gold, Wayne E, PA-C   14 Units at 05/27/20 1020  . lactated ringers infusion 500 mL  500 mL Intravenous Once PRN Barrett, Erin R, PA-C      . lactated ringers infusion   Intravenous Continuous Barrett, Erin R, PA-C      . lactated ringers infusion   Intravenous Continuous Barrett, Erin R, PA-C   Stopped at 05/23/20 1727  . linagliptin (TRADJENTA) tablet 5 mg  5 mg Oral Daily Gold, Wayne E, PA-C   5 mg at 05/27/20 1023  . magic mouthwash w/lidocaine  5 mL Oral TID PRN Barrett, Erin R, PA-C      . magnesium hydroxide (MILK OF MAGNESIA) suspension 30 mL  30 mL Oral Daily PRN Gold, Wayne E, PA-C      . MEDLINE mouth rinse   15 mL Mouth Rinse BID Lightfoot, Harrell O, MD   15 mL at 05/27/20 1025  . menthol-cetylpyridinium (CEPACOL) lozenge 3 mg  1 lozenge Oral PRN Barrett, Erin R, PA-C      . metoprolol tartrate (LOPRESSOR) tablet 12.5 mg  12.5 mg Oral BID Doree Fudge M, PA-C   12.5 mg at 05/27/20 1023  . midazolam (VERSED) injection 2 mg  2 mg Intravenous Q1H PRN Barrett, Erin R, PA-C   2 mg at 05/23/20 0223  .  ondansetron (ZOFRAN) injection 4 mg  4 mg Intravenous Q6H PRN Barrett, Erin R, PA-C   4 mg at 05/25/20 1820  . oxyCODONE (Oxy IR/ROXICODONE) immediate release tablet 5-10 mg  5-10 mg Oral Q3H PRN Barrett, Erin R, PA-C      . pantoprazole (PROTONIX) EC tablet 40 mg  40 mg Oral Daily Barrett, Erin R, PA-C   40 mg at 05/27/20 1023  . sodium chloride flush (NS) 0.9 % injection 10-40 mL  10-40 mL Intracatheter Q12H Corliss Skains, MD   10 mL at 05/25/20 0819  . sodium chloride flush (NS) 0.9 % injection 10-40 mL  10-40 mL Intracatheter PRN Lightfoot, Harrell O, MD      . sodium chloride flush (NS) 0.9 % injection 3 mL  3 mL Intravenous Q12H Barrett, Erin R, PA-C   3 mL at 05/27/20 1026  . sodium chloride flush (NS) 0.9 % injection 3 mL  3 mL Intravenous PRN Barrett, Erin R, PA-C      . sodium chloride flush (NS) 0.9 % injection 3 mL  3 mL Intravenous Q12H Lightfoot, Harrell O, MD   3 mL at 05/27/20 1026  . sodium chloride flush (NS) 0.9 % injection 3 mL  3 mL Intravenous PRN Lightfoot, Eliezer Lofts, MD      . traMADol Janean Sark) tablet 50-100 mg  50-100 mg Oral Q4H PRN Barrett, Erin R, PA-C   50 mg at 05/26/20 2027     Discharge Medications: Please see discharge summary for a list of discharge medications.  Relevant Imaging Results:  Relevant Lab Results:   Additional Information SSN# [id number]  Patrice Paradise, LCSW

## 2020-05-27 NOTE — TOC Initial Note (Signed)
Transition of Care [facility]) - Initial/Assessment Note    Patient Details  Name: [name] [name] MRN: [id number] Date of Birth: 13-Mar-1941  [facility]) CM/SW Contact:    Bary Castilla, LCSW Phone Number:[phone] 05/27/2020, 10:26 AM  Clinical Narrative:                 CSW met with patient to discuss PT recommendation of a SNF. Patient was aware of recommendation and in agreement with going to a ST SNF. CSW discussed the SNF process.CSW provided patient with medicare.gov rating list.  Patient gave CSW permission to fax referrals out to local facilities near [place]. Patient stated that she prefers [facility]..CSW answered questions about the SNF process and the next steps in the process.  TOC team will continue to assist for discharge planning needs.     Expected Discharge Plan: Skilled Nursing Facility Barriers to Discharge: Continued Medical Work up, SNF Pending bed offer   Patient Goals and CMS Choice Patient states their goals for this hospitalization and ongoing recovery are:: To go back home CMS Medicare.gov Compare Post Acute Care list provided to:: Patient Choice offered to / list presented to : Patient  Expected Discharge Plan and Services Expected Discharge Plan: [place]       Living arrangements for the past 2 months: Single Family Home                                      Prior Living Arrangements/Services Living arrangements for the past 2 months: Single Family Home Lives with:: Self Patient language and need for interpreter reviewed:: Yes          Care giver support system in place?: Yes (comment)      Activities of Daily Living Home Assistive Devices/Equipment: None ADL Screening (condition at time of admission) Patient's cognitive ability adequate to safely complete daily activities?: Yes Is the patient deaf or have difficulty hearing?: No Does the patient have difficulty seeing, even when wearing  glasses/contacts?: No Does the patient have difficulty concentrating, remembering, or making decisions?: No Patient able to express need for assistance with ADLs?: Yes Does the patient have difficulty dressing or bathing?: No Independently performs ADLs?: Yes (appropriate for developmental age) Does the patient have difficulty walking or climbing stairs?: Yes Weakness of Legs: Both Weakness of Arms/Hands: None  Permission Sought/Granted Permission sought to share information with : Family Supports Permission granted to share information with : Yes, Verbal Permission Granted  Share Information with NAME: [name]  Permission granted to share info w AGENCY: SNFs  Permission granted to share info w Relationship: Daughter  Permission granted to share info w Contact Information: [phone]  Emotional Assessment Appearance:: Appears younger than stated age Attitude/Demeanor/Rapport: Engaged Affect (typically observed): Calm, Accepting Orientation: : Oriented to Self, Oriented to Place, Oriented to  Time, Oriented to Situation      Admission diagnosis:  Atypical chest pain [R07.89] Chest pain [R07.9] Patient Active Problem List   Diagnosis Date Noted  . Abnormal cardiovascular stress test 05/18/2020  . Anxiety disorder, unspecified   . Chronic obstructive pulmonary disease, unspecified ([place])   . Gastro-esophageal reflux disease with esophagitis   . Major depressive disorder, single episode, unspecified   . Type 2 diabetes mellitus with hyperglycemia ([place])   . Unspecified osteoarthritis, unspecified site   . Anemia, unspecified   . Atherosclerotic heart disease of  native coronary artery without angina pectoris   . Hypertensive chronic kidney disease with stage 1 through stage 4 chronic kidney disease, or unspecified chronic kidney disease   . Hypertensive heart disease without heart failure   . Type 2 diabetes mellitus with diabetic nephropathy ([place])   . Unspecified abdominal hernia  without obstruction or gangrene   . Ventral hernia without obstruction or gangrene 09/01/2017  . Incarcerated ventral hernia   . Chest pain 06/11/2016  . Diabetes mellitus ([place]) 06/13/2009  . GERD 06/13/2009   PCP:  Maryruth Hancock, MD Pharmacy:   [place], [place] [address] [place] [address] Phone: [phone] Fax: [phone]     Social Determinants of Health (SDOH) Interventions    Readmission Risk Interventions No flowsheet data found.

## 2020-05-27 NOTE — Progress Notes (Addendum)
[address]       [place] [address]             [phone]      5 Days Post-Op Procedure(s) (LRB): CORONARY ARTERY BYPASS GRAFTING (CABG) x3 LIMA TO LAD, SVG TO OM1, SVG TO DISTAL RIGHT (N/A) TRANSESOPHAGEAL ECHOCARDIOGRAM (TEE) (N/A) Open Vein Harvest (Right) Subjective: Slowly feeling stronger Not having BM's , had suppos yesterday, + flatus  Objective: Vital signs in last 24 hours: Temp:  [98 F (36.7 C)-98.8 F (37.1 C)] 98 F (36.7 C) (06/13 0408) Pulse Rate:  [72-92] 72 (06/13 0409) Cardiac Rhythm: Normal sinus rhythm (06/13 0408) Resp:  [16-24] 23 (06/13 0409) BP: (111-139)/(49-59) 122/54 (06/13 0408) SpO2:  [92 %-99 %] 96 % (06/13 0409) Weight:  [73.2 kg] 73.2 kg (06/13 0409)  Hemodynamic parameters for last 24 hours:    Intake/Output from previous day: 06/12 0701 - 06/13 0700 In: 240 [P.O.:240] Out: -  Intake/Output this shift: Total I/O In: -  Out: 400 [Urine:400]  General appearance: alert, cooperative and no distress Heart: regular rate and rhythm Lungs: clear to auscultation bilaterally Abdomen: benign Extremities: no edema Wound: RLE incis healing well, chest incisional VAC in place  Lab Results: Recent Labs    05/25/20 0420  WBC 14.1*  HGB 10.4*  HCT 32.6*  PLT 169   BMET:  Recent Labs    05/26/20 0327 05/27/20 0513  NA 139 138  K 4.8 5.0  CL 107 106  CO2 22 25  GLUCOSE 113* 169*  BUN 31* 26*  CREATININE 1.68* 1.64*  CALCIUM 8.4* 8.6*    PT/INR: No results for input(s): LABPROT, INR in the last 72 hours. ABG    Component Value Date/Time   PHART 7.399 05/23/2020 0959   HCO3 21.9 05/23/2020 0959   TCO2 23 05/23/2020 0959   ACIDBASEDEF 2.0 05/23/2020 0959   O2SAT 97.0 05/23/2020 0959   CBG (last 3)  Recent Labs    05/26/20 1616 05/26/20 2113 05/27/20 0607  GLUCAP 170* 207* 151*    Meds Scheduled Meds: . acetaminophen  1,000 mg Oral Q6H   Or  . acetaminophen (TYLENOL) oral liquid 160  mg/5 mL  1,000 mg Per Tube Q6H  . aspirin EC  325 mg Oral Daily   Or  . aspirin  324 mg Per Tube Daily  . atorvastatin  40 mg Oral Daily  . bisacodyl  10 mg Oral Daily   Or  . bisacodyl  10 mg Rectal Daily  . Chlorhexidine Gluconate Cloth  6 each Topical Daily  . docusate sodium  200 mg Oral Daily  . enoxaparin (LOVENOX) injection  30 mg Subcutaneous QHS  . insulin aspart  0-24 Units Subcutaneous TID WC  . insulin aspart  0-5 Units Subcutaneous QHS  . insulin detemir  14 Units Subcutaneous Daily  . mouth rinse  15 mL Mouth Rinse BID  . metoprolol tartrate  12.5 mg Oral BID  . pantoprazole  40 mg Oral Daily  . sodium chloride flush  10-40 mL Intracatheter Q12H  . sodium chloride flush  3 mL Intravenous Q12H  . sodium chloride flush  3 mL Intravenous Q12H   Continuous Infusions: . sodium chloride Stopped (05/23/20 1727)  . sodium chloride    . sodium chloride    . sodium chloride Stopped (05/23/20 1727)  . sodium chloride    . lactated ringers    . lactated ringers    . lactated ringers Stopped (05/23/20  1727)   PRN Meds:.sodium chloride, sodium chloride, sodium chloride, dextrose, lactated ringers, magic mouthwash w/lidocaine, menthol-cetylpyridinium, midazolam, ondansetron (ZOFRAN) IV, oxyCODONE, sodium chloride flush, sodium chloride flush, sodium chloride flush, traMADol  Xrays DG Chest 2 View  Result Date: 05/26/2020 CLINICAL DATA:  Post heart surgery with chest pain. EXAM: CHEST - 2 VIEW COMPARISON:  05/24/2020 FINDINGS: Sternotomy wires unchanged. Persistent opacification over the left mid to lower lung with slightly better aeration likely effusion with atelectasis. Right lung is clear. Cardiomediastinal silhouette and remainder the exam is unchanged. IMPRESSION: Persistent hazy opacification over the left mid to lower lung with slightly better aeration likely effusion/atelectasis. Electronically Signed   By: Marin Olp M.D.   On: 05/26/2020 10:59     Assessment/Plan: S/P Procedure(s) (LRB): CORONARY ARTERY BYPASS GRAFTING (CABG) x3 LIMA TO LAD, SVG TO OM1, SVG TO DISTAL RIGHT (N/A) TRANSESOPHAGEAL ECHOCARDIOGRAM (TEE) (N/A) Open Vein Harvest (Right)  1 steady progress POD#5 2 vitals stable , no fevers 3 sats good on 2 liters, cont pulm toilet. O2 wean 4 renal insuff stable, does not appear to have much clinical volume overload- no diuretics currently 5 BS fair control , will increase insulin a little more- with GFR at 34 does not appear we should restart Janumet- will ask DM coordinator to assist 6 TOC for SNF- working on 7 MOM- for constipation  LOS: 9 days   Addendum, will add tradjenta for now instead of [place] PA-C Pager [phone] 05/27/2020  Patient seen and examined, agree with above  Remo Lipps C. Roxan Hockey, MD [facility] [phone]

## 2020-05-27 NOTE — Progress Notes (Signed)
Mobility Specialist - Progress Note   05/27/20 1325  Mobility  Activity Ambulated in room  Level of Assistance Modified independent, requires aide device or extra time  Assistive Device Front wheel walker  Distance Ambulated (ft) 40 ft  Mobility Response Tolerated well  Mobility performed by Mobility specialist  $Mobility charge 1 Mobility    Pre-mobility: 83 HR, 95% SpO2 Post-mobility: 78 HR, 95% SPO2  Pt declined further mobility due to feeling fatigued.   Mamie Levers Mobility Specialist

## 2020-05-27 NOTE — Progress Notes (Signed)
Pt walked 120 ft roon air  walker tolerated well

## 2020-05-28 LAB — GLUCOSE, CAPILLARY
Glucose-Capillary: 218 mg/dL — ABNORMAL HIGH (ref 70–99)
Glucose-Capillary: 217 mg/dL — ABNORMAL HIGH (ref 70–99)
Glucose-Capillary: 235 mg/dL — ABNORMAL HIGH (ref 70–99)
Glucose-Capillary: 185 mg/dL — ABNORMAL HIGH (ref 70–99)

## 2020-05-28 MED ORDER — ASPIRIN 325 MG PO TBEC: 325.0000 mg | DELAYED_RELEASE_TABLET | Freq: Every day | ORAL | 0 refills | Status: DC

## 2020-05-28 MED ORDER — LINAGLIPTIN 5 MG PO TABS: 5.0000 mg | ORAL_TABLET | Freq: Every day | ORAL | Status: DC

## 2020-05-28 MED ORDER — TRAMADOL HCL 50 MG PO TABS: 50.0000 mg | ORAL_TABLET | Freq: Four times a day (QID) | ORAL | 0 refills | Status: DC | PRN

## 2020-05-28 MED ORDER — METOPROLOL TARTRATE 25 MG PO TABS: 12.5000 mg | ORAL_TABLET | Freq: Two times a day (BID) | ORAL | 1 refills | Status: DC

## 2020-05-28 MED ORDER — ATORVASTATIN CALCIUM 40 MG PO TABS: 40.0000 mg | ORAL_TABLET | Freq: Every day | ORAL | 1 refills | Status: DC

## 2020-05-28 NOTE — TOC Progression Note (Signed)
Transition of Care [facility]) - Progression Note    Patient Details  Name: [name] [name] MRN: [id number] Date of Birth: 1941-06-27  Transition of Care [facility]) CM/SW Contact  Eduard Roux, [place] Phone Number: 05/28/2020, 10:16 AM  Clinical Narrative:     [name] is still pending.  CSW informed RN 30 day note and Fl2 needs MD to co-sign.  CSW will fax requested clinicals to PSARR/State when forms are co-signed.  Antony Blackbird, MSW, LCSWA Clinical Social Worker   Expected Discharge Plan: Skilled Nursing Facility Barriers to Discharge: Continued Medical Work up, SNF Pending bed offer  Expected Discharge Plan and Services Expected Discharge Plan: Skilled Nursing Facility       Living arrangements for the past 2 months: Single Family Home                                       Social Determinants of Health (SDOH) Interventions    Readmission Risk Interventions No flowsheet data found.

## 2020-05-28 NOTE — Progress Notes (Signed)
CARDIAC REHAB PHASE I   PRE:  Rate/Rhythm: 83 SR    BP: sitting 93/73    SaO2: 99 2L  MODE:  Ambulation: 240 ft   POST:  Rate/Rhythm: 95 SR    BP: sitting 125/58     SaO2: 98 RA  Pt able to stand with min assist and ambulate with RW. Small steps, slow pace. Fatigued with distance. Main c/o was stomach "pulling". Return to recliner, VSS. Left off O2. Encouraged IS.  [phone]   [name] CES, ACSM 05/28/2020 11:14 AM

## 2020-05-28 NOTE — TOC Progression Note (Signed)
Transition of Care [facility]) - Progression Note    Patient Details  Name: ZAMORIA BOSS MRN: [id number] Date of Birth: 06/28/1941  Transition of Care [facility]) CM/SW Contact  Eduard Roux, [place] Phone Number: 05/28/2020, 5:08 PM  Clinical Narrative:     CSW visit with patient at bedside. Patient remains agreeable to SNF placement.. Patient states she wants SNF in the [place] area, where her medical providers are close by. CSW called patient's daughter work number and home number to confirm [facility] SNF at discharge-CSW left voice voice message- waiting on response.  PASRR(state approval) still pending  Antony Blackbird, MSW, LCSWA Clinical Social Worker    Expected Discharge Plan: Skilled Nursing Facility Barriers to Discharge: Continued Medical Work up, SNF Pending bed offer  Expected Discharge Plan and Services Expected Discharge Plan: Skilled Nursing Facility       Living arrangements for the past 2 months: Single Family Home                                       Social Determinants of Health (SDOH) Interventions    Readmission Risk Interventions No flowsheet data found.

## 2020-05-28 NOTE — Progress Notes (Signed)
Mobility Specialist - Progress Note   05/28/20 1447  Mobility  Activity Ambulated in hall  Level of Assistance Modified independent, requires aide device or extra time  Assistive Device Front wheel walker  Distance Ambulated (ft) 250 ft  Mobility Response Tolerated fair  Mobility performed by Mobility specialist  $Mobility charge 1 Mobility    Pre-mobility: 76 HR, 120/50 BP, 98% SpO2 Post-mobility: 79 HR, 123/53 BP, 93% SpO2  Pt c/o L sided chest pain that radiated to her R side that worsened with ambulation. She described it as a cramping pain that she rated a 5/10. Nurse was notified.   Mamie Levers Mobility Specialist

## 2020-05-28 NOTE — TOC Progression Note (Cosign Needed Addendum)
  RE: [name] [name] Date of Birth: 1941-12-13 Date: 05/28/2020   To Whom It May Concern:  Please be advised that the above-named patient will require a short-term nursing home stay - anticipated 30 days or less for rehabilitation and strengthening.  The plan is for return home.

## 2020-05-28 NOTE — Progress Notes (Addendum)
      [address]       [place] [address]             [phone]        6 Days Post-Op Procedure(s) (LRB): CORONARY ARTERY BYPASS GRAFTING (CABG) x3 LIMA TO LAD, SVG TO OM1, SVG TO DISTAL RIGHT (N/A) TRANSESOPHAGEAL ECHOCARDIOGRAM (TEE) (N/A) Open Vein Harvest (Right)  Subjective: Patient denies nausea. She states she is hot (turned down room temperature) and tired.  Objective: Vital signs in last 24 hours: Temp:  [98 F (36.7 C)-99 F (37.2 C)] 99 F (37.2 C) (06/14 0615) Pulse Rate:  [77-89] 80 (06/14 0615) Cardiac Rhythm: Normal sinus rhythm (06/14 0405) Resp:  [16-29] 27 (06/14 0615) BP: (111-134)/(42-67) 134/42 (06/14 0405) SpO2:  [92 %-100 %] 98 % (06/14 0615) Weight:  [72.5 kg] 72.5 kg (06/14 0615)  Pre op weight 70 kg Current Weight  05/28/20 72.5 kg      Intake/Output from previous day: 06/13 0701 - 06/14 0700 In: 120 [P.O.:120] Out: 405 [Urine:404; Stool:1]   Physical Exam:  Cardiovascular: RRR Pulmonary: Mostly clear Abdomen: Soft, non tender, bowel sounds present. Extremities: Mild bilateral lower extremity edema. Wounds: RLE wounds are clean and dry.  No erythema or signs of infection. Pravena wound vac removed and sternal wound is clean, dry, no sign of infection  Lab Results: CBC: No results for input(s): WBC, HGB, HCT, PLT in the last 72 hours. BMET:  Recent Labs    05/26/20 0327 05/27/20 0513  NA 139 138  K 4.8 5.0  CL 107 106  CO2 22 25  GLUCOSE 113* 169*  BUN 31* 26*  CREATININE 1.68* 1.64*  CALCIUM 8.4* 8.6*    PT/INR:  Lab Results  Component Value Date   INR 1.3 (H) 05/22/2020   INR 1.0 05/21/2020   ABG:  INR: Will add last result for INR, ABG once components are confirmed Will add last 4 CBG results once components are confirmed  Assessment/Plan:  1. CV - SR with HR in the 70's. On Lopressor 12.5 mg bid. 2.  Pulmonary - On 2 liters oxygen via [place]. Encourage incentive spirometer. 3. Volume  Overload - Will hold Lasix today as creatinine increased. 4.  Expected post op acute blood loss anemia - Last H and H  stable at 10.4 and 32.6 5. CBGs 181/172/218. On Insulin. Pre op HGA1C 7.4. On Linagliptin as will not restart Janumet secondary to elevated creatinine. 6. Creatinine yesterday 1.64. She is not on ACE and is not on diuretics. Creatinine upon admission 1.4 7. To SNF when bed available  Donielle M ZimmermanPA-C 05/28/2020,7:00 AM   C/o incisional pain along left sternocostal margin Sternum stable and wound intact Mobility still limited Awaiting SNF  Fabiana Dromgoole C. Dorris Fetch, MD [facility] [phone]

## 2020-05-28 NOTE — TOC Progression Note (Signed)
Transition of Care [facility]) - Progression Note    Patient Details  Name: [name] [name] MRN: [id number] Date of Birth: [birth year]  Transition of Care [facility]) CM/SW Contact  Eduard Roux, [place] Phone Number: 05/28/2020, 1:19 PM  Clinical Narrative:     30 day note signature requested in Epic and actual form placed on shadow chart as back up.  Antony Blackbird, MSW, LCSWA Clinical Social Worker   Expected Discharge Plan: Skilled Nursing Facility Barriers to Discharge: Continued Medical Work up, SNF Pending bed offer  Expected Discharge Plan and Services Expected Discharge Plan: Skilled Nursing Facility       Living arrangements for the past 2 months: Single Family Home                                       Social Determinants of Health (SDOH) Interventions    Readmission Risk Interventions No flowsheet data found.

## 2020-05-29 LAB — GLUCOSE, CAPILLARY
Glucose-Capillary: 180 mg/dL — ABNORMAL HIGH (ref 70–99)
Glucose-Capillary: 191 mg/dL — ABNORMAL HIGH (ref 70–99)
Glucose-Capillary: 191 mg/dL — ABNORMAL HIGH (ref 70–99)
Glucose-Capillary: 186 mg/dL — ABNORMAL HIGH (ref 70–99)

## 2020-05-29 NOTE — Progress Notes (Signed)
CARDIAC REHAB PHASE I   PRE:  Rate/Rhythm: 85 SR    BP: sitting 120/53    SaO2: 93 RA  MODE:  Ambulation: 280 ft   POST:  Rate/Rhythm: 110 ST    BP: sitting 151/54     SaO2: 97 RA  Pt c/o sternal heaviness today. Able to stand with verbal reminders and walk with RW. Steady, slow. Small steps. Rest x2. Return to recliner. HR and BP elevated. Pt still only at 300 mL IS. Encouraged more use. [phone]   [name] CES, ACSM 05/29/2020 10:14 AM

## 2020-05-29 NOTE — Progress Notes (Addendum)
      [address]       [place] [address]             [phone]        7 Days Post-Op Procedure(s) (LRB): CORONARY ARTERY BYPASS GRAFTING (CABG) x3 LIMA TO LAD, SVG TO OM1, SVG TO DISTAL RIGHT (N/A) TRANSESOPHAGEAL ECHOCARDIOGRAM (TEE) (N/A) Open Vein Harvest (Right)  Subjective: Patient just waking up this am. She has no specific complaints. She did ask which SNF she is going to but will have to wait for social work as Community education officer dependent.  Objective: Vital signs in last 24 hours: Temp:  [98.1 F (36.7 C)-98.6 F (37 C)] 98.4 F (36.9 C) (06/15 0522) Pulse Rate:  [70-82] 70 (06/15 0522) Cardiac Rhythm: Normal sinus rhythm (06/14 1903) Resp:  [18-22] 20 (06/15 0522) BP: (115-133)/(52-67) 115/56 (06/15 0522) SpO2:  [93 %-99 %] 95 % (06/14 2312) Weight:  [74.1 kg] 74.1 kg (06/15 0522)  Pre op weight 70 kg Current Weight  05/29/20 74.1 kg      Intake/Output from previous day: 06/14 0701 - 06/15 0700 In: 240 [P.O.:240] Out: 850 [Urine:850]   Physical Exam:  Cardiovascular: RRR Pulmonary: Mostly clear Abdomen: Soft, non tender, bowel sounds present. Extremities: Mild bilateral lower extremity edema. Wounds: Sternal and RLE wounds are clean and dry.  No erythema or signs of infection.   Lab Results: CBC: No results for input(s): WBC, HGB, HCT, PLT in the last 72 hours. BMET:  Recent Labs    05/27/20 0513  NA 138  K 5.0  CL 106  CO2 25  GLUCOSE 169*  BUN 26*  CREATININE 1.64*  CALCIUM 8.6*    PT/INR:  Lab Results  Component Value Date   INR 1.3 (H) 05/22/2020   INR 1.0 05/21/2020   ABG:  INR: Will add last result for INR, ABG once components are confirmed Will add last 4 CBG results once components are confirmed  Assessment/Plan:  1. CV - SR with HR in the 70's. On Lopressor 12.5 mg bid. 2.  Pulmonary - On 2 liters oxygen via [place]. Encourage incentive spirometer. 3. Volume Overload - Will hold Lasix today as creatinine  increased. 4.  Expected post op acute blood loss anemia - Last H and H  stable at 10.4 and 32.6 5. CBGs 235/185/180. On Insulin. Pre op HGA1C 7.4. On Linagliptin as will not restart Janumet secondary to elevated creatinine. 6. Last creatinine remains 1.64. She is not on ACE and is not on diuretics. Creatinine upon admission 1.4 7. Deconditioned;encourage ambulation and to SNF when bed available  Lelon Huh [facility] 05/29/2020,7:00 AM    Patient seen and examined, agree with above Awaiting word on SNF  Brynnly Bonet C. Dorris Fetch, MD [facility] [phone]

## 2020-05-29 NOTE — Progress Notes (Signed)
Mobility Specialist - Progress Note   05/29/20 1540  Mobility  Activity Ambulated in hall  Level of Assistance Modified independent, requires aide device or extra time  Assistive Device Front wheel walker  Distance Ambulated (ft) 500 ft  Mobility Response Tolerated well  Mobility performed by Mobility specialist  $Mobility charge 1 Mobility    Pre-mobility: 73 HR, 114/54 BP, 95% SpO2 During mobility: 95HR, 95% SpO2 Post-mobility: 86 HR, 103/77 BP, 100% SpO2  Did interval training as done with PT at the pt's request. She did this for 200 ft and required 2 standing rest breaks. After this we walked at a normal pace.  [name] [name] Mobility Specialist

## 2020-05-29 NOTE — Progress Notes (Signed)
OT Cancellation Note  Patient Details Name: [name] [name] MRN: [id number] DOB: [birth year]   Cancelled Treatment:    Reason Eval/Treat Not Completed: Patient declined x2 this AM. First attempt patient just got back from ambulating with tech. Second attempt pt requesting to rest more. Will re-attempt as schedule permits.  Marlyce Huge OT OT office: [phone]   Carmelia Roller 05/29/2020, 11:25 AM

## 2020-05-29 NOTE — Progress Notes (Signed)
Physical Therapy Treatment Patient Details Name: [name] [name] MRN: [id number] DOB: 1941/09/24 Today's Date: 05/29/2020    History of Present Illness Pt is a 79 y.o. female admitted for L heart cath and coronary angiography 05/21/20. S/P CABG 05/22/20, vent end date 06/08-06/09.  Left ICU to 4E 06/10. PMH OA, type 2 DM, renal insufficency, HTN, COPD, anxiety.    PT Comments    Pt up with OT upon arrival of PT, agreeable to session with focus on progressing mobility and endurance. The pt was able to demo improvement in walking distance and completed 100 ft of speed intervals (10s "fast": 20s "normal" walking) with VSS and significant change in gait speed. The pt continues to benefit from the use of RW for increased stability, and will continue to benefit from skilled PT to further progress walking endurance and dynamic stability to facilitate safe return to prior level of function and independence.    Follow Up Recommendations  SNF;Supervision/Assistance - 24 hour     Equipment Recommendations  Rolling walker with 5" wheels       Precautions / Restrictions Precautions Precautions: Sternal;Fall Restrictions Weight Bearing Restrictions: No Other Position/Activity Restrictions: sternal    Mobility  Bed Mobility Overal bed mobility: Needs Assistance             General bed mobility comments: pt standing with OT upon arrival, seated in recliner at end of session  Transfers Overall transfer level: Needs assistance Equipment used: Rolling walker (2 wheeled) Transfers: Sit to/from Stand Sit to Stand: Min guard         General transfer comment: minG for use of sternal precautions and VC for hand positioning  Ambulation/Gait Ambulation/Gait assistance: Min assist Gait Distance (Feet): 200 Feet Assistive device: Rolling walker (2 wheeled) Gait Pattern/deviations: Step-through pattern;Decreased stride length;Decreased step length - right;Decreased step length - left;Trunk  flexed;Wide base of support Gait velocity: .25 m/s "normal" pace, 0.44 m/s "fast" pace Gait velocity interpretation: <1.31 ft/sec, indicative of household ambulator General Gait Details: Pt with slowed gait with short steps and minimal clearance bilaterally.     Balance Overall balance assessment: Needs assistance Sitting-balance support: Feet supported Sitting balance-Leahy Scale: Good Sitting balance - Comments: Pt unable to shift hips toward EOB saftey while maintaining precautions. Pt able to sit EOB and maintain balance without support   Standing balance support: Bilateral upper extremity supported;During functional activity Standing balance-Leahy Scale: Fair Standing balance comment: Pt able to stand without RW, Pt unable to safely ambulate without RW at this time.                            Cognition Arousal/Alertness: Awake/alert Behavior During Therapy: WFL for tasks assessed/performed Overall Cognitive Status: Within Functional Limits for tasks assessed Area of Impairment: Problem solving;Awareness;Safety/judgement                         Safety/Judgement: Decreased awareness of safety Awareness: Emergent Problem Solving: Slow processing General Comments: pt generally WFL, agreeable to education and able to follow commands with minimal extra processing time      Exercises      General Comments General comments (skin integrity, edema, etc.): VSS through session on RA      Pertinent Vitals/Pain Pain Assessment: Faces Faces Pain Scale: Hurts a little bit Pain Location: chest, abdomen Pain Descriptors / Indicators: Discomfort;Crushing Pain Intervention(s): Limited activity within patient's tolerance;Monitored during session  PT Goals (current goals can now be found in the care plan section) Acute Rehab PT Goals Patient Stated Goal: Go to rehab PT Goal Formulation: With patient Time For Goal Achievement: 06/07/20 Potential to  Achieve Goals: Good Progress towards PT goals: Progressing toward goals    Frequency    Min 2X/week      PT Plan Current plan remains appropriate       AM-PAC PT "6 Clicks" Mobility   Outcome Measure  Help needed turning from your back to your side while in a flat bed without using bedrails?: A Little Help needed moving from lying on your back to sitting on the side of a flat bed without using bedrails?: A Little Help needed moving to and from a bed to a chair (including a wheelchair)?: A Little Help needed standing up from a chair using your arms (e.g., wheelchair or bedside chair)?: A Little Help needed to walk in hospital room?: A Little Help needed climbing 3-5 steps with a railing? : A Lot 6 Click Score: 17    End of Session Equipment Utilized During Treatment: Gait belt Activity Tolerance: Patient tolerated treatment well Patient left: in chair;with call bell/phone within reach;with nursing/sitter in room;with chair alarm set Nurse Communication: Mobility status PT Visit Diagnosis: Unsteadiness on feet (R26.81);Pain;Muscle weakness (generalized) (M62.81) Pain - Right/Left:  (chest) Pain - part of body:  (chest)     Time: 3903-0092 PT Time Calculation (min) (ACUTE ONLY): 21 min  Charges:  $Gait Training: 8-22 mins                     Karma Ganja, PT, DPT   Acute Rehabilitation Department Pager #: [phone]   Otho Bellows 05/29/2020, 3:07 PM

## 2020-05-29 NOTE — Progress Notes (Signed)
Occupational Therapy Treatment Patient Details Name: [name] [name] MRN: [id number] DOB: 1941-04-02 Today's Date: 05/29/2020    History of present illness Pt is a 79 y.o. female admitted for L heart cath and coronary angiography 05/21/20. S/P CABG 05/22/20, vent end date 06/08-06/09.  Left ICU to 4E 06/10. PMH OA, type 2 DM, renal insufficency, HTN, COPD, anxiety.   OT comments  Patient progressing well towards acute OT goals, would benefit from continued sternal precaution education as patient had limited carry over between functional transfers and when asked to recall precautions patient initially stating "I haven't been told anything," then able to recall not reaching out with arms. Will continue to follow.   Follow Up Recommendations  SNF;Supervision/Assistance - 24 hour    Equipment Recommendations  3 in 1 bedside commode       Precautions / Restrictions Precautions Precautions: Sternal;Fall Restrictions Other Position/Activity Restrictions: sternal       Mobility Bed Mobility               General bed mobility comments: in recliner upon arrival  Transfers Overall transfer level: Needs assistance Equipment used: Rolling walker (2 wheeled) Transfers: Sit to/from Stand Sit to Stand: Min assist         General transfer comment: Pt required min(A) for sit to stand for safety and maintaining sternal precautions    Balance Overall balance assessment: Needs assistance Sitting-balance support: Feet supported Sitting balance-Leahy Scale: Good     Standing balance support: Bilateral upper extremity supported;During functional activity Standing balance-Leahy Scale: Fair Standing balance comment: Pt able to stand without RW, Pt unable to safely ambulate without RW at this time.                           ADL either performed or assessed with clinical judgement   ADL Overall ADL's : Needs assistance/impaired     Grooming: Oral care;Min  guard;Standing;Wash/dry hands                   Toilet Transfer: Minimal Engineer, agricultural;Ambulation Toilet Transfer Details (indicate cue type and reason): cues for technique to maintain sternal precautions Toileting- Clothing Manipulation and Hygiene: Set up;Sitting/lateral lean Toileting - Clothing Manipulation Details (indicate cue type and reason): peri care after voiding     Functional mobility during ADLs: Minimal assistance;Rolling walker;Cueing for safety General ADL Comments: would benefit from continued education on sternal precautions due to limited carry over of hand placement from recliner transfer to toilet transfer               Cognition Arousal/Alertness: Awake/alert Behavior During Therapy: [facility] for tasks assessed/performed Overall Cognitive Status: No family/caregiver present to determine baseline cognitive functioning Area of Impairment: Problem solving;Awareness;Safety/judgement                         Safety/Judgement: Decreased awareness of safety Awareness: Emergent Problem Solving: Slow processing General Comments: when prompted to recall sternal precautions pt states "They haven't told me anything" when prompted by OT regarding arm movement patient states "I can't reach out with my arms"                    Pertinent Vitals/ Pain       Pain Assessment: Faces Faces Pain Scale: Hurts little more Pain Location: chest, abdomen Pain Descriptors / Indicators: Discomfort (nausea) Pain Intervention(s): Monitored during session  Frequency  Min 2X/week        Progress Toward Goals  OT Goals(current goals can now be found in the care plan section)  Progress towards OT goals: Progressing toward goals  Acute Rehab OT Goals Patient Stated Goal: Go to rehab OT Goal Formulation: With patient Time For Goal Achievement: 06/08/20 Potential to Achieve Goals: Good ADL Goals Pt Will Perform Upper Body Bathing: with  set-up;sitting Pt Will Perform Lower Body Bathing: with supervision;sit to/from stand Pt Will Transfer to Toilet: with supervision;ambulating;regular height toilet Pt Will Perform Toileting - Clothing Manipulation and hygiene: with supervision;sitting/lateral leans;sit to/from stand Additional ADL Goal #1: Pt will demosntrate 2 sternal precautions during ADL tasks with min vc  Plan Discharge plan remains appropriate       AM-PAC OT "6 Clicks" Daily Activity     Outcome Measure   Help from another person eating meals?: None Help from another person taking care of personal grooming?: A Little Help from another person toileting, which includes using toliet, bedpan, or urinal?: A Little Help from another person bathing (including washing, rinsing, drying)?: A Lot Help from another person to put on and taking off regular upper body clothing?: A Lot Help from another person to put on and taking off regular lower body clothing?: A Lot 6 Click Score: 16    End of Session Equipment Utilized During Treatment: Rolling walker  OT Visit Diagnosis: Unsteadiness on feet (R26.81);Other abnormalities of gait and mobility (R26.89);Muscle weakness (generalized) (M62.81);Pain;Other symptoms and signs involving cognitive function Pain - part of body:  (chest)   Activity Tolerance Patient tolerated treatment well   Patient Left Other (comment) (with PT )   Nurse Communication Mobility status        Time: 4098-1191 OT Time Calculation (min): 23 min  Charges: OT General Charges $OT Visit: 1 Visit OT Treatments $Self Care/Home Management : 23-37 mins  Delbert Phenix OT OT office: [phone]   Rosemary Holms 05/29/2020, 2:54 PM

## 2020-05-30 LAB — GLUCOSE, CAPILLARY
Glucose-Capillary: 149 mg/dL — ABNORMAL HIGH (ref 70–99)
Glucose-Capillary: 281 mg/dL — ABNORMAL HIGH (ref 70–99)
Glucose-Capillary: 174 mg/dL — ABNORMAL HIGH (ref 70–99)
Glucose-Capillary: 210 mg/dL — ABNORMAL HIGH (ref 70–99)

## 2020-05-30 LAB — CREATININE, SERUM
Creatinine, Ser: 1.41 mg/dL — ABNORMAL HIGH (ref 0.44–1.00)
GFR calc Af Amer: 41 mL/min — ABNORMAL LOW (ref >60)
GFR calc non Af Amer: 36 mL/min — ABNORMAL LOW (ref >60)

## 2020-05-30 NOTE — TOC Progression Note (Addendum)
Transition of Care [facility]) - Progression Note    Patient Details  Name: SHEREA LIPTAK MRN: [id number] Date of Birth: 04-Feb-1941  Transition of Care [facility]) CM/SW Contact  Eduard Roux, [place] Phone Number: 05/30/2020, 10:56 AM  Clinical Narrative:     Received message from PASRR/State, MD must co-sign FL2 and 30 day note. RN informed and 30 day placed on chart.   Antony Blackbird, MSW, LCSWA Clinical Social Worker   Expected Discharge Plan: Skilled Nursing Facility Barriers to Discharge: Continued Medical Work up, SNF Pending bed offer  Expected Discharge Plan and Services Expected Discharge Plan: Skilled Nursing Facility       Living arrangements for the past 2 months: Single Family Home                                       Social Determinants of Health (SDOH) Interventions    Readmission Risk Interventions No flowsheet data found.

## 2020-05-30 NOTE — Progress Notes (Signed)
Physical Therapy Treatment Patient Details Name: [name] [name] MRN: [id number] DOB: 1940/12/16 Today's Date: 05/30/2020    History of Present Illness Pt is a 78 y.o. female admitted for L heart cath and coronary angiography 05/21/20. S/P CABG 05/22/20, vent end date 06/08-06/09.  Left ICU to 4E 06/10. PMH OA, type 2 DM, renal insufficency, HTN, COPD, anxiety.    PT Comments    Pt fatigued from ambulating multiple times this day, so focus of PT session on LE strengthening and short distance ambulation. Pt with poor understanding and carryover of sternal precautions this day, PT reiterated precautions throughout session. Pt tolerated LE exercises and gait well, PT continuing to recommend SNF to maximize pt functional recovery and for pt safety.    Follow Up Recommendations  SNF;Supervision/Assistance - 24 hour     Equipment Recommendations  Rolling walker with 5" wheels    Recommendations for Other Services       Precautions / Restrictions Precautions Precautions: Sternal;Fall Precaution Comments: reviewed "move in a tube" and reinforced appropriate UE placement throughout session Restrictions Other Position/Activity Restrictions: sternal    Mobility  Bed Mobility Overal bed mobility: Needs Assistance Bed Mobility: Sidelying to Sit;Rolling;Sit to Supine Rolling: Min assist Sidelying to sit: Mod assist;HOB elevated   Sit to supine: Mod assist   General bed mobility comments: min-mod assist for log roll and supine<>sit for trunk and LE management, pt stating "you'll have to lift my legs" prior to even attempting.  Transfers Overall transfer level: Needs assistance Equipment used: Rolling walker (2 wheeled) Transfers: Sit to/from Stand Sit to Stand: Min guard         General transfer comment: min guard for safety, verbal cuing for hand placement on knees when rising to stand to maintain sternal precautions.  Ambulation/Gait Ambulation/Gait assistance: Min guard Gait  Distance (Feet): 200 Feet Assistive device: Rolling walker (2 wheeled) Gait Pattern/deviations: Step-through pattern;Decreased stride length;Trunk flexed;Wide base of support Gait velocity: decr   General Gait Details: min guard for safety, verbal cuing for upright posture and placement in RW. HR WFL, RR max 34   Stairs             Wheelchair Mobility    Modified Rankin (Stroke Patients Only)       Balance Overall balance assessment: Needs assistance Sitting-balance support: Feet supported Sitting balance-Leahy Scale: Good     Standing balance support: Bilateral upper extremity supported;During functional activity Standing balance-Leahy Scale: Fair Standing balance comment: Pt able to stand without RW, Pt unable to safely ambulate without RW at this time.                            Cognition Arousal/Alertness: Awake/alert Behavior During Therapy: WFL for tasks assessed/performed Overall Cognitive Status: Impaired/Different from baseline Area of Impairment: Problem solving;Awareness;Safety/judgement;Attention;Following commands;Memory                   Current Attention Level: Selective Memory: Decreased recall of precautions Following Commands: Follows one step commands consistently Safety/Judgement: Decreased awareness of safety   Problem Solving: Slow processing;Decreased initiation;Difficulty sequencing;Requires verbal cues;Requires tactile cues General Comments: pt could not recall sternal precautions, requires frequent reinforcement for safe mobility with precautions.      Exercises General Exercises - Lower Extremity Ankle Circles/Pumps: AROM;Both;20 reps;Supine Short Arc Quad: AROM;Both;15 reps;Supine Hip ABduction/ADduction: AAROM;Both;10 reps;Supine Straight Leg Raises: AROM;Both;10 reps;Supine    General Comments        Pertinent Vitals/Pain Pain Assessment:  Faces Faces Pain Scale: Hurts little more Pain Location: chest,  abdomen Pain Descriptors / Indicators: Discomfort;Sore;Operative site guarding Pain Intervention(s): Limited activity within patient's tolerance;Monitored during session;Repositioned    Home Living                      Prior Function            PT Goals (current goals can now be found in the care plan section) Acute Rehab PT Goals Patient Stated Goal: Go to rehab PT Goal Formulation: With patient Time For Goal Achievement: 06/07/20 Potential to Achieve Goals: Good Progress towards PT goals: Progressing toward goals    Frequency    Min 3X/week      PT Plan Current plan remains appropriate    Co-evaluation              AM-PAC PT "6 Clicks" Mobility   Outcome Measure  Help needed turning from your back to your side while in a flat bed without using bedrails?: A Little Help needed moving from lying on your back to sitting on the side of a flat bed without using bedrails?: A Little Help needed moving to and from a bed to a chair (including a wheelchair)?: A Little Help needed standing up from a chair using your arms (e.g., wheelchair or bedside chair)?: A Little Help needed to walk in hospital room?: A Little Help needed climbing 3-5 steps with a railing? : A Lot 6 Click Score: 17    End of Session Equipment Utilized During Treatment: Gait belt Activity Tolerance: Patient tolerated treatment well Patient left: in chair;with call bell/phone within reach;with nursing/sitter in room;with chair alarm set Nurse Communication: Mobility status PT Visit Diagnosis: Unsteadiness on feet (R26.81);Pain;Muscle weakness (generalized) (M62.81) Pain - Right/Left:  (chest) Pain - part of body:  (chest)     Time: 4650-3546 PT Time Calculation (min) (ACUTE ONLY): 25 min  Charges:  $Gait Training: 8-22 mins $Therapeutic Exercise: 8-22 mins                     Shaye Lagace E, PT Acute Rehabilitation Services Pager [phone]  Office [phone]   Littie Chiem D Nekeya Briski 05/30/2020,  5:10 PM

## 2020-05-30 NOTE — Progress Notes (Signed)
Mobility Specialist: Progress Note    05/30/20 1337  Mobility  Activity Ambulated in hall  Level of Assistance Modified independent, requires aide device or extra time  Assistive Device Front wheel walker  Distance Ambulated (ft) 320 ft  Mobility Response Tolerated fair  Mobility performed by Mobility specialist  $Mobility charge 1 Mobility   Pre-Mobility: 73 HR, 110/62 BP, 94% SpO2 Post-Mobility: 88 HR, 112/53 BP, 98% SpO2  Pt had to stop to take one seated rest break lasting a couple minutes w/ c/o SOB and pain in her L hip.   [facility] Kaiyana Bedore Mobility Specialist

## 2020-05-30 NOTE — Progress Notes (Addendum)
      [address]       [place] [address]             [phone]        8 Days Post-Op Procedure(s) (LRB): CORONARY ARTERY BYPASS GRAFTING (CABG) x3 LIMA TO LAD, SVG TO OM1, SVG TO DISTAL RIGHT (N/A) TRANSESOPHAGEAL ECHOCARDIOGRAM (TEE) (N/A) Open Vein Harvest (Right)  Subjective: Patient without specific complaint this am. She wants to know her blood type and where she is going.  Objective: Vital signs in last 24 hours: Temp:  [97.6 F (36.4 C)-98.8 F (37.1 C)] 98.2 F (36.8 C) (06/16 0449) Pulse Rate:  [65-85] 65 (06/16 0449) Cardiac Rhythm: Normal sinus rhythm (06/15 1905) Resp:  [17-20] 20 (06/16 0449) BP: (103-142)/(50-77) 135/68 (06/16 0449) SpO2:  [94 %-100 %] 97 % (06/16 0449) Weight:  [71.6 kg] 71.6 kg (06/16 0449)  Pre op weight 70 kg Current Weight  05/30/20 71.6 kg      Intake/Output from previous day: 06/15 0701 - 06/16 0700 In: 400 [P.O.:400] Out: 1200 [Urine:1200]   Physical Exam:  Cardiovascular: RRR Pulmonary: Mostly clear Abdomen: Soft, non tender, bowel sounds present. Extremities: Mild bilateral lower extremity edema. Wounds: Sternal and RLE wounds are clean and dry.  No erythema or signs of infection.   Lab Results: CBC: No results for input(s): WBC, HGB, HCT, PLT in the last 72 hours. BMET:  Recent Labs    05/30/20 0540  CREATININE 1.41*    PT/INR:  Lab Results  Component Value Date   INR 1.3 (H) 05/22/2020   INR 1.0 05/21/2020   ABG:  INR: Will add last result for INR, ABG once components are confirmed Will add last 4 CBG results once components are confirmed  Assessment/Plan:  1. CV - SR with HR in the 70's. On Lopressor 12.5 mg bid. 2.  Pulmonary - On 2 liters oxygen via . Encourage incentive spirometer. 3.  Expected post op acute blood loss anemia - Last H and H  stable at 10.4 and 32.6 4. CBGs 191/186/149. On Insulin. Pre op HGA1C 7.4. On Linagliptin as will not restart Janumet secondary to  elevated creatinine. 5. Creatinine this am decreased to 1.41. She is not on ACE and is not on diuretics. Creatinine upon admission 1.4 6. Deconditioned;encourage ambulation and to SNF when PASSAR from the state accepted. Awaiting placement and I informed her of her blood type  Lelon Huh ZimmermanPA-C 05/30/2020,7:00 AM  Patient seen and examined, agree with above  Viviann Spare C. Dorris Fetch, MD [facility] [phone]

## 2020-05-30 NOTE — NC FL2 (Addendum)
[place] LEVEL OF CARE SCREENING TOOL     IDENTIFICATION  Patient Name: [name] [name] Birthdate: 12-08-1941 Sex: female Admission Date (Current Location): 05/17/2020  [facility] and [place] Number:  Herbalist and Address:  [facility] [place], [place] [address] [place], [place] [address]      Provider Number: [id number]  Attending Physician Name and Address:  Lajuana Matte, MD  Relative Name and Phone Number:  Cindy [place]: Hospital Recommended Level of Care: [place] Prior Approval Number:    Date Approved/Denied:   PASRR Number: Pending  Discharge Plan: SNF    Current Diagnoses: Patient Active Problem List   Diagnosis Date Noted  . Abnormal cardiovascular stress test 05/18/2020  . Anxiety disorder, unspecified   . Chronic obstructive pulmonary disease, unspecified ([place])   . Gastro-esophageal reflux disease with esophagitis   . Major depressive disorder, single episode, unspecified   . Type 2 diabetes mellitus with hyperglycemia ([place])   . Unspecified osteoarthritis, unspecified site   . Anemia, unspecified   . Atherosclerotic heart disease of native coronary artery without angina pectoris   . Hypertensive chronic kidney disease with stage 1 through stage 4 chronic kidney disease, or unspecified chronic kidney disease   . Hypertensive heart disease without heart failure   . Type 2 diabetes mellitus with diabetic nephropathy ([place])   . Unspecified abdominal hernia without obstruction or gangrene   . Ventral hernia without obstruction or gangrene 09/01/2017  . Incarcerated ventral hernia   . Chest pain 06/11/2016  . Diabetes mellitus ([place]) 06/13/2009  . GERD 06/13/2009    Orientation RESPIRATION BLADDER Height & Weight     Self, Time, Situation, Place  O2 (2l) Continent Weight: 157 lb 12.8 oz (71.6 kg) Height:  4\' 11"  (149.9 cm)  BEHAVIORAL SYMPTOMS/MOOD NEUROLOGICAL BOWEL  NUTRITION STATUS      Continent Diet (see discharge summary)  AMBULATORY STATUS COMMUNICATION OF NEEDS Skin   Limited Assist Verbally Surgical wounds (left leg)                       Personal Care Assistance Level of Assistance  Bathing, Feeding, Dressing Bathing Assistance: Independent Feeding assistance: Limited assistance Dressing Assistance: Limited assistance     Functional Limitations Info  Sight, Hearing, Speech Sight Info: Impaired Hearing Info: Adequate Speech Info: Adequate    SPECIAL CARE FACTORS FREQUENCY  PT (By licensed PT), OT (By licensed OT)     PT Frequency: 5x per week OT Frequency: 5x per week            Contractures Contractures Info: Not present    Additional Factors Info  Insulin Sliding Scale, Code Status, Allergies Code Status Info: Full Allergies Info: Contrast Media (Iodinated Diagnostic Agents), Morphine And Related, Other, Penicillins, Codeine   Insulin Sliding Scale Info: insulin aspart (novoLOG) injection 0-24 Units ,insulin detemir (LEVEMIR) injection 14 Units       Current Medications (05/30/2020):  This is the current hospital active medication list Current Facility-Administered Medications  Medication Dose Route Frequency Provider Last Rate Last Admin  . 0.45 % sodium chloride infusion   Intravenous Continuous PRN Barrett, Erin R, PA-C   Stopped at 05/23/20 1727  . 0.9 %  sodium chloride infusion  250 mL Intravenous Continuous Barrett, Erin R, PA-C      . 0.9 %  sodium chloride infusion   Intravenous Continuous Barrett, Erin R,  PA-C      . 0.9 %  sodium chloride infusion   Intravenous PRN Corliss Skains, MD   Stopped at 05/23/20 1727  . 0.9 %  sodium chloride infusion  250 mL Intravenous PRN Corliss Skains, MD      . aspirin EC tablet 325 mg  325 mg Oral Daily Barrett, Erin R, PA-C   325 mg at 05/30/20 1040   Or  . aspirin chewable tablet 324 mg  324 mg Per Tube Daily Barrett, Erin R, PA-C      . atorvastatin  (LIPITOR) tablet 40 mg  40 mg Oral Daily Barrett, Erin R, PA-C   40 mg at 05/30/20 1040  . bisacodyl (DULCOLAX) EC tablet 10 mg  10 mg Oral Daily Barrett, Erin R, PA-C   10 mg at 05/30/20 1040   Or  . bisacodyl (DULCOLAX) suppository 10 mg  10 mg Rectal Daily Barrett, Erin R, PA-C      . Chlorhexidine Gluconate Cloth 2 % PADS 6 each  6 each Topical Daily Corliss Skains, MD   6 each at 05/29/20 1001  . dextrose 50 % solution 0-50 mL  0-50 mL Intravenous PRN Barrett, Erin R, PA-C      . docusate sodium (COLACE) capsule 200 mg  200 mg Oral Daily Barrett, Erin R, PA-C   200 mg at 05/30/20 1040  . enoxaparin (LOVENOX) injection 30 mg  30 mg Subcutaneous QHS Corliss Skains, MD   30 mg at 05/29/20 2320  . insulin aspart (novoLOG) injection 0-24 Units  0-24 Units Subcutaneous TID WC Ardelle Balls, PA-C   4 Units at 05/29/20 1818  . insulin aspart (novoLOG) injection 0-5 Units  0-5 Units Subcutaneous QHS Ardelle Balls, PA-C   2 Units at 05/26/20 2143  . insulin detemir (LEVEMIR) injection 14 Units  14 Units Subcutaneous Daily Gershon Crane E, PA-C   14 Units at 05/30/20 1040  . lactated ringers infusion 500 mL  500 mL Intravenous Once PRN Barrett, Erin R, PA-C      . lactated ringers infusion   Intravenous Continuous Barrett, Erin R, PA-C      . lactated ringers infusion   Intravenous Continuous Barrett, Erin R, PA-C   Stopped at 05/23/20 1727  . linagliptin (TRADJENTA) tablet 5 mg  5 mg Oral Daily Gold, Wayne E, PA-C   5 mg at 05/30/20 1040  . magic mouthwash w/lidocaine  5 mL Oral TID PRN Barrett, Erin R, PA-C   5 mL at 05/28/20 0942  . magnesium hydroxide (MILK OF MAGNESIA) suspension 30 mL  30 mL Oral Daily PRN Gold, Wayne E, PA-C      . MEDLINE mouth rinse  15 mL Mouth Rinse BID Lightfoot, Eliezer Lofts, MD   15 mL at 05/30/20 1043  . menthol-cetylpyridinium (CEPACOL) lozenge 3 mg  1 lozenge Oral PRN Barrett, Erin R, PA-C      . metoprolol tartrate (LOPRESSOR) tablet 12.5 mg   12.5 mg Oral BID Doree Fudge M, PA-C   12.5 mg at 05/30/20 1040  . midazolam (VERSED) injection 2 mg  2 mg Intravenous Q1H PRN Barrett, Erin R, PA-C   2 mg at 05/23/20 0223  . ondansetron (ZOFRAN) injection 4 mg  4 mg Intravenous Q6H PRN Barrett, Erin R, PA-C   4 mg at 05/25/20 1820  . oxyCODONE (Oxy IR/ROXICODONE) immediate release tablet 5-10 mg  5-10 mg Oral Q3H PRN Barrett, Erin R, PA-C      .  pantoprazole (PROTONIX) EC tablet 40 mg  40 mg Oral Daily Barrett, Erin R, PA-C   40 mg at 05/30/20 1040  . sodium chloride flush (NS) 0.9 % injection 10-40 mL  10-40 mL Intracatheter Q12H Corliss Skains, MD   10 mL at 05/25/20 0819  . sodium chloride flush (NS) 0.9 % injection 10-40 mL  10-40 mL Intracatheter PRN Lightfoot, Harrell O, MD      . sodium chloride flush (NS) 0.9 % injection 3 mL  3 mL Intravenous Q12H Barrett, Erin R, PA-C   3 mL at 05/28/20 0945  . sodium chloride flush (NS) 0.9 % injection 3 mL  3 mL Intravenous PRN Barrett, Erin R, PA-C      . sodium chloride flush (NS) 0.9 % injection 3 mL  3 mL Intravenous Q12H Lightfoot, Harrell O, MD   3 mL at 05/30/20 1043  . sodium chloride flush (NS) 0.9 % injection 3 mL  3 mL Intravenous PRN Corliss Skains, MD   3 mL at 05/29/20 1000  . traMADol (ULTRAM) tablet 50-100 mg  50-100 mg Oral Q4H PRN Barrett, Erin R, PA-C   100 mg at 05/29/20 2052     Discharge Medications: Please see discharge summary for a list of discharge medications.  Relevant Imaging Results:  Relevant Lab Results:   Additional Information SSN# [id number]  Eduard Roux, LCSWA

## 2020-05-30 NOTE — Progress Notes (Signed)
Pt up with MS earlier. Has PT later today. Will f/u tomorrow.  Ethelda Chick CES, ACSM 3:01 PM 05/30/2020

## 2020-05-31 LAB — SARS CORONAVIRUS 2 (TAT 6-24 HRS): SARS Coronavirus 2: NEGATIVE

## 2020-05-31 LAB — GLUCOSE, CAPILLARY
Glucose-Capillary: 194 mg/dL — ABNORMAL HIGH (ref 70–99)
Glucose-Capillary: 237 mg/dL — ABNORMAL HIGH (ref 70–99)
Glucose-Capillary: 172 mg/dL — ABNORMAL HIGH (ref 70–99)
Glucose-Capillary: 184 mg/dL — ABNORMAL HIGH (ref 70–99)

## 2020-05-31 MED ORDER — LACTULOSE 10 GM/15ML PO SOLN
20.0000 g | Freq: Once | ORAL | Status: AC
  Administered 2020-05-31: 20 g via ORAL
  Filled 2020-05-31: qty 30

## 2020-05-31 MED ORDER — METOCLOPRAMIDE HCL 5 MG/ML IJ SOLN
5.0000 mg | Freq: Four times a day (QID) | INTRAMUSCULAR | Status: DC
  Administered 2020-05-31 – 2020-06-01 (×4): 5 mg via INTRAVENOUS
  Filled 2020-05-31 (×5): qty 2

## 2020-05-31 MED ORDER — INSULIN DETEMIR 100 UNIT/ML ~~LOC~~ SOLN
16.0000 [IU] | Freq: Every day | SUBCUTANEOUS | Status: DC
  Administered 2020-05-31 – 2020-06-01 (×2): 16 [IU] via SUBCUTANEOUS
  Filled 2020-05-31 (×2): qty 0.16

## 2020-05-31 NOTE — Plan of Care (Signed)
  Problem: Education: Goal: Knowledge of General Education information will improve Description: Including pain rating scale, medication(s)/side effects and non-pharmacologic comfort measures 05/31/2020 2100 by Renelda Mom, RN Outcome: Progressing 05/31/2020 2056 by Renelda Mom, RN Outcome: Progressing

## 2020-05-31 NOTE — Care Management Important Message (Addendum)
Important Message  Patient Details  Name: [name] [name] MRN: [id number] Date of Birth: [birth year]   Medicare Important Message Given:  Yes  Pt. refused to sign the letter,she wanted to read it. I left the letter with the pt.     Sloan Leiter Smith 05/31/2020, 8:49 AM

## 2020-05-31 NOTE — Plan of Care (Signed)
  Problem: Education: Goal: Knowledge of General Education information will improve Description Including pain rating scale, medication(s)/side effects and non-pharmacologic comfort measures Outcome: Progressing   Problem: Health Behavior/Discharge Planning: Goal: Ability to manage health-related needs will improve Outcome: Progressing   

## 2020-05-31 NOTE — Progress Notes (Signed)
Inpatient Diabetes Program Recommendations  AACE/ADA: New Consensus Statement on Inpatient Glycemic Control (2015)  Target Ranges:  Prepandial:   less than 140 mg/dL      Peak postprandial:   less than 180 mg/dL (1-2 hours)      Critically ill patients:  140 - 180 mg/dL   Lab Results  Component Value Date   GLUCAP 237 (H) 05/31/2020   HGBA1C 7.4 (H) 05/21/2020    Review of Glycemic Control Results for JESSIA, KIEF (MRN [id number]) as of 05/31/2020 14:34  Ref. Range 05/30/2020 11:19 05/30/2020 16:00 05/30/2020 20:52 05/31/2020 05:56 05/31/2020 11:34  Glucose-Capillary Latest Ref Range: 70 - 99 mg/dL 174 (H) 944 (H) 967 (H) 194 (H) 237 (H)   Diabetes history:  DM2  Outpatient Diabetes medications:  Janumet 50-100 units bid   Current orders for Inpatient glycemic control:  Levemir 16 units tid  Novolog 0-24 units tid Trajenta 5 mg daily  Inpatient Diabetes Program Recommendations:     Novolog 2 units tid with meals if eats at least 50% of meal and cbg > 80 mg/dl  Will continue to follow while inpatient.  Thank you, Dulce Sellar, RN, BSN Diabetes Coordinator Inpatient Diabetes Program [phone] (team pager from 8a-5p)

## 2020-05-31 NOTE — TOC Progression Note (Signed)
Transition of Care [facility]) - Progression Note    Patient Details  Name: [name] [name] MRN: [id number] Date of Birth: 03-Jun-1941  Transition of [facility]) CM/SW Contact  Eduard Roux, [place] Phone Number: 05/31/2020, 5:24 PM  Clinical Narrative:     CSW received PSARR # [id number]  From 06/17-07/17.   RN updated and covid requested. CSW will confirm bed offer tomorrow.  Antony Blackbird, MSW, LCSWA Clinical Social Worker     Expected Discharge Plan: Skilled Nursing Facility Barriers to Discharge: Awaiting State Approval [name])  Expected Discharge Plan and Services Expected Discharge Plan: Skilled Nursing Facility       Living arrangements for the past 2 months: Single Family Home Expected Discharge Date: 05/31/20                                     Social Determinants of Health (SDOH) Interventions    Readmission Risk Interventions No flowsheet data found.

## 2020-05-31 NOTE — Progress Notes (Signed)
Patient still without bowel movement this evening. Active bowel sounds. Patient still with some nausea. Patient requesting prune juice. Prune juice given Will monitor patient. Glynnis Gavel, Randall An RN

## 2020-05-31 NOTE — Progress Notes (Addendum)
      [address]       [place] [address]             [phone]        9 Days Post-Op Procedure(s) (LRB): CORONARY ARTERY BYPASS GRAFTING (CABG) x3 LIMA TO LAD, SVG TO OM1, SVG TO DISTAL RIGHT (N/A) TRANSESOPHAGEAL ECHOCARDIOGRAM (TEE) (N/A) Open Vein Harvest (Right)  Subjective: Patient had some nausea and left sided abdominal pain yesterday afternoon. She has constipation this am  Objective: Vital signs in last 24 hours: Temp:  [98.1 F (36.7 C)-99.2 F (37.3 C)] 98.3 F (36.8 C) (06/16 2320) Pulse Rate:  [74-111] 74 (06/17 0435) Cardiac Rhythm: Normal sinus rhythm (06/16 1900) Resp:  [16-20] 18 (06/17 0435) BP: (119-146)/(50-59) 132/59 (06/17 0435) SpO2:  [94 %-99 %] 96 % (06/17 0435) Weight:  [71.5 kg] 71.5 kg (06/17 0607)  Pre op weight 70 kg Current Weight  05/31/20 71.5 kg      Intake/Output from previous day: 06/16 0701 - 06/17 0700 In: -  Out: 500 [Urine:500]   Physical Exam:  Cardiovascular: RRR Pulmonary: Mostly clear Abdomen: Soft, non tender, bowel sounds present. Extremities:Trace bilateral lower extremity edema. Wounds: Sternal and RLE wounds are clean and dry.  No erythema or signs of infection.   Lab Results: CBC: No results for input(s): WBC, HGB, HCT, PLT in the last 72 hours. BMET:  Recent Labs    05/30/20 0540  CREATININE 1.41*    PT/INR:  Lab Results  Component Value Date   INR 1.3 (H) 05/22/2020   INR 1.0 05/21/2020   ABG:  INR: Will add last result for INR, ABG once components are confirmed Will add last 4 CBG results once components are confirmed  Assessment/Plan:  1. CV - SR with HR in the 70's. On Lopressor 12.5 mg bid. 2.  Pulmonary - On 2 liters oxygen via [place]. Encourage incentive spirometer. 3.  Expected post op acute blood loss anemia - Last H and H  stable at 10.4 and 32.6 4. CBGs 281/174/210. On Insulin but will increase for better glucose control. Pre op HGA1C 7.4. On Linagliptin as will  not restart Janumet secondary to mildly elevated creatinine. 5. Creatinine yesterday decreased to 1.41. She is not on ACE and is not on diuretics. Creatinine upon admission 1.4 6. GI-will give Reglan and laxative 7. Deconditioned;encourage ambulation and to SNF when PASSAR from the state accepted.  Donielle M ZimmermanPA-C 05/31/2020,7:01 AM   Patient seen and examined, agree with above Awaiting SNF  Viviann Spare C. Dorris Fetch, MD [facility] [phone]

## 2020-05-31 NOTE — Progress Notes (Signed)
Mobility Specialist: Progress Note    05/31/20 1525  Mobility  Activity Ambulated in hall  Level of Assistance Modified independent, requires aide device or extra time  Assistive Device Front wheel walker  Distance Ambulated (ft) 330 ft (330 x1, 95 x1)  Mobility Response Tolerated well  Mobility performed by Mobility specialist  Bed Position Chair  $Mobility charge 1 Mobility   Pre-Mobility: 77 HR, 127/82 BP, 95% SpO2 During Mobility: 95% SpO2 Post-Mobility: 105 HR, 177/73 BP, 96% SpO2  Pt took one standing rest break after 341ft. Pt performed 20 secs of slow walking and 10 secs of fast walking for 395ft. Pt finished ambulation at normal pace. Nurse was notified of elevated BP after ambulating.   [facility] Floella Ensz Mobility Specialist

## 2020-05-31 NOTE — Progress Notes (Signed)
CARDIAC REHAB PHASE I   PRE:  Rate/Rhythm: 84 SR    BP: sitting 138/62    SaO2: 93 RA  MODE:  Ambulation: 300 ft   POST:  Rate/Rhythm: 103 ST    BP: sitting 150/70     SaO2: 98 RA  Pt needed mod-max assist to move to EOB. Able to stand min assist with verbal cues. Walked with RW slow and steady. Fatigued with distance, rest x2. C/o left side "pulling" and pain with distance. To recliner, VSS. Practiced IS, 250-300 mL. [id number]   [name] CES, ACSM 05/31/2020 12:01 PM

## 2020-05-31 NOTE — Progress Notes (Signed)
Patient with complaints of nausea on and off today with eating, Patient states she can take a few bites of food and then she gets nauseated and has pain. Some meals are worse than others.  Patient with Active bowel sounds but without bowel movement. Patient does report on passing gas. Patient was given lactulose as ordered. Patient also given Reglan as ordered.

## 2020-06-01 DIAGNOSIS — I129 Hypertensive chronic kidney disease with stage 1 through stage 4 chronic kidney disease, or unspecified chronic kidney disease: Secondary | ICD-10-CM | POA: Diagnosis not present

## 2020-06-01 DIAGNOSIS — Z7401 Bed confinement status: Secondary | ICD-10-CM | POA: Diagnosis not present

## 2020-06-01 DIAGNOSIS — E782 Mixed hyperlipidemia: Secondary | ICD-10-CM | POA: Diagnosis not present

## 2020-06-01 DIAGNOSIS — R2689 Other abnormalities of gait and mobility: Secondary | ICD-10-CM | POA: Diagnosis not present

## 2020-06-01 DIAGNOSIS — Z951 Presence of aortocoronary bypass graft: Secondary | ICD-10-CM | POA: Diagnosis not present

## 2020-06-01 DIAGNOSIS — I10 Essential (primary) hypertension: Secondary | ICD-10-CM | POA: Diagnosis not present

## 2020-06-01 DIAGNOSIS — E7849 Other hyperlipidemia: Secondary | ICD-10-CM | POA: Diagnosis not present

## 2020-06-01 DIAGNOSIS — I25119 Atherosclerotic heart disease of native coronary artery with unspecified angina pectoris: Secondary | ICD-10-CM | POA: Diagnosis not present

## 2020-06-01 DIAGNOSIS — E119 Type 2 diabetes mellitus without complications: Secondary | ICD-10-CM | POA: Diagnosis not present

## 2020-06-01 DIAGNOSIS — R279 Unspecified lack of coordination: Secondary | ICD-10-CM | POA: Diagnosis not present

## 2020-06-01 DIAGNOSIS — J449 Chronic obstructive pulmonary disease, unspecified: Secondary | ICD-10-CM | POA: Diagnosis not present

## 2020-06-01 DIAGNOSIS — E669 Obesity, unspecified: Secondary | ICD-10-CM | POA: Diagnosis not present

## 2020-06-01 DIAGNOSIS — M6281 Muscle weakness (generalized): Secondary | ICD-10-CM | POA: Diagnosis not present

## 2020-06-01 DIAGNOSIS — E785 Hyperlipidemia, unspecified: Secondary | ICD-10-CM | POA: Diagnosis not present

## 2020-06-01 DIAGNOSIS — E1165 Type 2 diabetes mellitus with hyperglycemia: Secondary | ICD-10-CM | POA: Diagnosis not present

## 2020-06-01 DIAGNOSIS — I959 Hypotension, unspecified: Secondary | ICD-10-CM | POA: Diagnosis not present

## 2020-06-01 DIAGNOSIS — F329 Major depressive disorder, single episode, unspecified: Secondary | ICD-10-CM | POA: Diagnosis not present

## 2020-06-01 DIAGNOSIS — I469 Cardiac arrest, cause unspecified: Secondary | ICD-10-CM | POA: Diagnosis not present

## 2020-06-01 DIAGNOSIS — R41841 Cognitive communication deficit: Secondary | ICD-10-CM | POA: Diagnosis not present

## 2020-06-01 DIAGNOSIS — Z48812 Encounter for surgical aftercare following surgery on the circulatory system: Secondary | ICD-10-CM | POA: Diagnosis not present

## 2020-06-01 DIAGNOSIS — F419 Anxiety disorder, unspecified: Secondary | ICD-10-CM | POA: Diagnosis not present

## 2020-06-01 DIAGNOSIS — D62 Acute posthemorrhagic anemia: Secondary | ICD-10-CM | POA: Diagnosis not present

## 2020-06-01 DIAGNOSIS — I2510 Atherosclerotic heart disease of native coronary artery without angina pectoris: Secondary | ICD-10-CM | POA: Diagnosis not present

## 2020-06-01 LAB — GLUCOSE, CAPILLARY
Glucose-Capillary: 210 mg/dL — ABNORMAL HIGH (ref 70–99)
Glucose-Capillary: 248 mg/dL — ABNORMAL HIGH (ref 70–99)

## 2020-06-01 NOTE — TOC Transition Note (Signed)
Transition of Care [facility]) - CM/SW Discharge Note   Patient Details  Name: JALICIA ROSZAK MRN: [id number] Date of Birth: 06/10/1941  Transition of Care [facility]) CM/SW Contact:  Eduard Roux, LCSWA Phone Number: 06/01/2020, 12:06 PM   Clinical Narrative:     Patient will DC to: [facility]  DC Date: 06/01/2020 Family Notified: Cindy,daughter Transport NB:VAPO @ 2:00 pm   Per MD patient is ready for discharge. RN, patient, and facility notified of DC. Discharge Summary sent to facility. RN given number for [phone]). Ambulance transport requested for patient.   Clinical Social Worker signing off.  Antony Blackbird, MSW, LCSWA Clinical Social Worker    Final next level of care: Skilled Nursing Facility Barriers to Discharge: Barriers Resolved   Patient Goals and CMS Choice Patient states their goals for this hospitalization and ongoing recovery are:: To go back home CMS Medicare.gov Compare Post Acute Care list provided to:: Patient Choice offered to / list presented to : Patient  Discharge Placement PASRR number recieved: 05/31/20            Patient chooses bed at:  [facility]) Patient to be transferred to facility by: [facility] Name of family member notified: Cindy,daughter Patient and family notified of of transfer: 06/01/20  Discharge Plan and Services                                     Social Determinants of Health (SDOH) Interventions     Readmission Risk Interventions No flowsheet data found.

## 2020-06-01 NOTE — Progress Notes (Signed)
Physical Therapy Treatment Patient Details Name: [name] [name] MRN: [id number] DOB: 1941-07-22 Today's Date: 06/01/2020    History of Present Illness Pt is a 79 y.o. female admitted for L heart cath and coronary angiography 05/21/20. S/P CABG 05/22/20, vent end date 06/08-06/09.  Left ICU to 4E 06/10. PMH OA, type 2 DM, renal insufficency, HTN, COPD, anxiety.    PT Comments    Pt resting upon PT arrival, but willing to mobilize. Pt requiring min guard-min assist for mobility at this time, ambulating hallway distance with stable vitals and standing rest breaks as needed. Pt with no recall of sternal precautions, but maintains them well with cuing from PT. Pt resting comfortably in recliner upon PT exit, PT to continue to follow acutely.    Follow Up Recommendations  SNF;Supervision/Assistance - 24 hour     Equipment Recommendations  Rolling walker with 5" wheels    Recommendations for Other Services       Precautions / Restrictions Precautions Precautions: Sternal;Fall Precaution Comments: reviewed "move in a tube" and reinforced appropriate UE placement throughout session Restrictions Weight Bearing Restrictions: Yes (sternal precautions) Other Position/Activity Restrictions: sternal    Mobility  Bed Mobility Overal bed mobility: Needs Assistance Bed Mobility: Rolling;Sidelying to Sit Rolling: Min guard Sidelying to sit: Min assist;HOB elevated       General bed mobility comments: min guard for roll with cuing for technique, min assist for sidelying to sit for trunk elevation and positioning at EOB. Pt with good lateral leaning to step-wise scoot to EOB.  Transfers Overall transfer level: Needs assistance Equipment used: Rolling walker (2 wheeled) Transfers: Sit to/from Stand Sit to Stand: Min guard         General transfer comment: for safety, verbal cuing for hand placement across chest or on knees when rising to maintain sternal  precautions  Ambulation/Gait Ambulation/Gait assistance: Min guard Gait Distance (Feet): 120 Feet (+100) Assistive device: Rolling walker (2 wheeled) Gait Pattern/deviations: Step-through pattern;Decreased stride length;Trunk flexed;Wide base of support Gait velocity: decr   General Gait Details: for safety, verbal cuing for upright posture but pt states "my chest incision pulls me down". RRmax 29, HRmax 114 bpm.   Stairs             Wheelchair Mobility    Modified Rankin (Stroke Patients Only)       Balance Overall balance assessment: Needs assistance Sitting-balance support: Feet supported Sitting balance-Leahy Scale: Good     Standing balance support: Bilateral upper extremity supported;During functional activity Standing balance-Leahy Scale: Fair                              Cognition Arousal/Alertness: Awake/alert Behavior During Therapy: WFL for tasks assessed/performed Overall Cognitive Status: Impaired/Different from baseline Area of Impairment: Problem solving;Awareness;Safety/judgement;Following commands;Memory                     Memory: Decreased recall of precautions;Decreased short-term memory Following Commands: Follows one step commands consistently Safety/Judgement: Decreased awareness of safety   Problem Solving: Slow processing;Decreased initiation;Difficulty sequencing;Requires verbal cues;Requires tactile cues General Comments: Pt with no recall of precautions and states "they haven't told me any rules yet"      Exercises      General Comments        Pertinent Vitals/Pain Pain Assessment: 0-10 Pain Score: 7  Pain Location: chest, incisional Pain Descriptors / Indicators: Sore Pain Intervention(s): Limited activity within patient's tolerance;Monitored during session;Repositioned  Home Living                      Prior Function            PT Goals (current goals can now be found in the care plan  section) Acute Rehab PT Goals Patient Stated Goal: Go to rehab PT Goal Formulation: With patient Time For Goal Achievement: 06/07/20 Potential to Achieve Goals: Good Progress towards PT goals: Progressing toward goals    Frequency    Min 3X/week      PT Plan Current plan remains appropriate    Co-evaluation              AM-PAC PT "6 Clicks" Mobility   Outcome Measure  Help needed turning from your back to your side while in a flat bed without using bedrails?: A Little Help needed moving from lying on your back to sitting on the side of a flat bed without using bedrails?: A Little Help needed moving to and from a bed to a chair (including a wheelchair)?: A Little Help needed standing up from a chair using your arms (e.g., wheelchair or bedside chair)?: A Little Help needed to walk in hospital room?: A Little Help needed climbing 3-5 steps with a railing? : A Lot 6 Click Score: 17    End of Session Equipment Utilized During Treatment: Gait belt Activity Tolerance: Patient tolerated treatment well Patient left: in chair;with call bell/phone within reach;with nursing/sitter in room;with chair alarm set Nurse Communication: Mobility status PT Visit Diagnosis: Unsteadiness on feet (R26.81);Pain;Muscle weakness (generalized) (M62.81) Pain - Right/Left:  (chest) Pain - part of body:  (chest)     Time: 1610-9604 PT Time Calculation (min) (ACUTE ONLY): 20 min  Charges:  $Gait Training: 8-22 mins                     [name] [name] E, PT Acute Rehabilitation Services Pager [phone]  Office [phone]    [name] [name] 06/01/2020, 11:46 AM

## 2020-06-01 NOTE — Plan of Care (Signed)
Continue to monitor

## 2020-06-01 NOTE — Progress Notes (Signed)
Mobility Specialist: Progress Note   06/01/20 1553  Mobility  Activity  (Cancel)  Mobility performed by Mobility specialist   Pt discharging.   [facility] Kytzia Gienger Mobility Specialist

## 2020-06-01 NOTE — Progress Notes (Signed)
Occupational Therapy Treatment Patient Details Name: [name] [name] MRN: [id number] DOB: 1941/05/09 Today's Date: 06/01/2020    History of present illness Pt is a 79 y.o. female admitted for L heart cath and coronary angiography 05/21/20. S/P CABG 05/22/20, vent end date 06/08-06/09.  Left ICU to 4E 06/10. PMH OA, type 2 DM, renal insufficency, HTN, COPD, anxiety.   OT comments  Pt presents seated in recliner pleasant and willing to participate in therapy session. Pt continues to have decreased recall of sternal precautions, reviewed start of session and pt requiring up to mod cues for carryover during session completion. Pt tolerating room level mobility and standing grooming ADL tasks using RW with minguard assist. She continues to require increased assist for LB ADL (at least modA). Pt tolerating session well though does report fatigue with room level activity. VSS throughout. Continue to recommend SNF level therapies at time of discharge. Will follow while acutely admitted.   Follow Up Recommendations  SNF;Supervision/Assistance - 24 hour    Equipment Recommendations  3 in 1 bedside commode;Other (comment) (TBD in next venue)          Precautions / Restrictions Precautions Precautions: Sternal;Fall Precaution Comments: reviewed "move in a tube" and reinforced appropriate UE placement throughout session Restrictions Weight Bearing Restrictions: Yes (sternal precautions) Other Position/Activity Restrictions: sternal       Mobility Bed Mobility Overal bed mobility: Needs Assistance Bed Mobility: Rolling;Sidelying to Sit Rolling: Min guard Sidelying to sit: Min assist;HOB elevated       General bed mobility comments: received OOB in recliner   Transfers Overall transfer level: Needs assistance Equipment used: Rolling walker (2 wheeled) Transfers: Sit to/from Stand Sit to Stand: Min guard         General transfer comment: min cues for UE placement, pt hugging pillow  with UEs across chest during transitions    Balance Overall balance assessment: Needs assistance Sitting-balance support: Feet supported Sitting balance-Leahy Scale: Good     Standing balance support: Bilateral upper extremity supported;During functional activity Standing balance-Leahy Scale: Fair                             ADL either performed or assessed with clinical judgement   ADL Overall ADL's : Needs assistance/impaired     Grooming: Wash/dry hands;Wash/dry face;Oral care;Brushing hair;Min guard;Standing Grooming Details (indicate cue type and reason): close minguard for balance and safety with standing activity, intermittent cues for sternal precautions              Lower Body Dressing: Moderate assistance;Sit to/from stand Lower Body Dressing Details (indicate cue type and reason): pt with difficulty reaching LEs (able to achieve modified figure 4), requires assist for socks              Functional mobility during ADLs: Min guard;Minimal assistance;Rolling walker General ADL Comments: pt tolerating room level mobility and standing ADL activity, continues to have limtiations due to fatigue and generalized weakness                        Cognition Arousal/Alertness: Awake/alert Behavior During Therapy: WFL for tasks assessed/performed Overall Cognitive Status: Impaired/Different from baseline Area of Impairment: Problem solving;Awareness;Safety/judgement;Following commands;Memory                     Memory: Decreased recall of precautions;Decreased short-term memory Following Commands: Follows one step commands consistently Safety/Judgement: Decreased awareness of safety  Problem Solving: Slow processing;Decreased initiation;Difficulty sequencing;Requires verbal cues;Requires tactile cues General Comments: improved recall of some sternal precautions today (hugging pillow with transfers) requires assist to recall "move in the tube"          Exercises Exercises: Other exercises Other Exercises Other Exercises: use of IS with min cues, pulling 750-1080ml, x5   Shoulder Instructions       General Comments VSS, max HR noted low 100s, BP post activity 125/62    Pertinent Vitals/ Pain       Pain Assessment: Faces Pain Score: 7  Faces Pain Scale: Hurts little more Pain Location: chest, incisional Pain Descriptors / Indicators: Sore Pain Intervention(s): Monitored during session;Repositioned  Home Living                                          Prior Functioning/Environment              Frequency  Min 2X/week        Progress Toward Goals  OT Goals(current goals can now be found in the care plan section)  Progress towards OT goals: Progressing toward goals  Acute Rehab OT Goals Patient Stated Goal: Go to rehab OT Goal Formulation: With patient Time For Goal Achievement: 06/08/20 Potential to Achieve Goals: Good ADL Goals Pt Will Perform Upper Body Bathing: with set-up;sitting Pt Will Perform Lower Body Bathing: with supervision;sit to/from stand Pt Will Transfer to Toilet: with supervision;ambulating;regular height toilet Pt Will Perform Toileting - Clothing Manipulation and hygiene: with supervision;sitting/lateral leans;sit to/from stand Additional ADL Goal #1: Pt will demosntrate 2 sternal precautions during ADL tasks with min vc  Plan Discharge plan remains appropriate    Co-evaluation                 AM-PAC OT "6 Clicks" Daily Activity     Outcome Measure   Help from another person eating meals?: None Help from another person taking care of personal grooming?: A Little Help from another person toileting, which includes using toliet, bedpan, or urinal?: A Little Help from another person bathing (including washing, rinsing, drying)?: A Lot Help from another person to put on and taking off regular upper body clothing?: A Lot Help from another person to put on  and taking off regular lower body clothing?: A Lot 6 Click Score: 16    End of Session Equipment Utilized During Treatment: Gait belt;Rolling walker  OT Visit Diagnosis: Unsteadiness on feet (R26.81);Other abnormalities of gait and mobility (R26.89);Muscle weakness (generalized) (M62.81);Pain;Other symptoms and signs involving cognitive function Pain - part of body:  (chest)   Activity Tolerance Patient tolerated treatment well   Patient Left in chair;with call bell/phone within reach;with chair alarm set   Nurse Communication Mobility status        Time: 1062-6948 OT Time Calculation (min): 17 min  Charges: OT General Charges $OT Visit: 1 Visit OT Treatments $Self Care/Home Management : 8-22 mins  Lou Cal, [place] Pager [phone] Office [place] 06/01/2020, 12:17 PM

## 2020-06-01 NOTE — Progress Notes (Signed)
      [address]       [place] [address]             [phone]        10 Days Post-Op Procedure(s) (LRB): CORONARY ARTERY BYPASS GRAFTING (CABG) x3 LIMA TO LAD, SVG TO OM1, SVG TO DISTAL RIGHT (N/A) TRANSESOPHAGEAL ECHOCARDIOGRAM (TEE) (N/A) Open Vein Harvest (Right)  Subjective: Patient had a bowel movement around 5 am. She feels better and denies nausea this am.  Objective: Vital signs in last 24 hours: Temp:  [97.8 F (36.6 C)-99.5 F (37.5 C)] 99 F (37.2 C) (06/18 0438) Pulse Rate:  [74-103] 78 (06/18 0438) Cardiac Rhythm: Normal sinus rhythm (06/17 2000) Resp:  [16-22] 22 (06/18 0438) BP: (120-150)/(52-70) 129/70 (06/18 0438) SpO2:  [94 %-98 %] 94 % (06/18 0438) Weight:  [70.8 kg] 70.8 kg (06/18 0438)  Pre op weight 70 kg Current Weight  06/01/20 70.8 kg      Intake/Output from previous day: 06/17 0701 - 06/18 0700 In: 0  Out: 1250 [Urine:1250]   Physical Exam:  Cardiovascular: RRR Pulmonary: Mostly clear Abdomen: Soft, non tender, bowel sounds present. Extremities:Trace bilateral lower extremity edema. Wounds: Sternal and RLE wounds are clean and dry.  No erythema or signs of infection.   Lab Results: CBC: No results for input(s): WBC, HGB, HCT, PLT in the last 72 hours. BMET:  Recent Labs    05/30/20 0540  CREATININE 1.41*    PT/INR:  Lab Results  Component Value Date   INR 1.3 (H) 05/22/2020   INR 1.0 05/21/2020   ABG:  INR: Will add last result for INR, ABG once components are confirmed Will add last 4 CBG results once components are confirmed  Assessment/Plan:  1. CV - SR with HR in the 70's. On Lopressor 12.5 mg bid. 2.  Pulmonary - On 2 liters oxygen via [place]. Encourage incentive spirometer. 3.  Expected post op acute blood loss anemia - Last H and H  stable at 10.4 and 32.6 4. CBGs 172/184/210. On Insulin. Pre op HGA1C 7.4. On Linagliptin as will not restart Janumet secondary to mildly elevated  creatinine. 5. Last creatinine  decreased to 1.41. She is not on ACE and is not on diuretics. Creatinine upon admission 1.4 6. Deconditioned;encourage ambulation and to SNF when PASRR from the state accepted.  Gabriel Conry M ZimmermanPA-C 06/01/2020,7:03 AM

## 2020-06-01 NOTE — Progress Notes (Signed)
Report called to [facility], Charity fundraiser at [facility].

## 2020-06-01 NOTE — Progress Notes (Signed)
[facility] here to take patient to [facility] at this time.

## 2020-06-04 DIAGNOSIS — E7849 Other hyperlipidemia: Secondary | ICD-10-CM | POA: Diagnosis not present

## 2020-06-04 DIAGNOSIS — I25119 Atherosclerotic heart disease of native coronary artery with unspecified angina pectoris: Secondary | ICD-10-CM | POA: Diagnosis not present

## 2020-06-04 DIAGNOSIS — I10 Essential (primary) hypertension: Secondary | ICD-10-CM | POA: Diagnosis not present

## 2020-06-04 DIAGNOSIS — E119 Type 2 diabetes mellitus without complications: Secondary | ICD-10-CM | POA: Diagnosis not present

## 2020-06-07 ENCOUNTER — Other Ambulatory Visit: Payer: Self-pay | Admitting: *Deleted

## 2020-06-07 ENCOUNTER — Ambulatory Visit (INDEPENDENT_AMBULATORY_CARE_PROVIDER_SITE_OTHER): Payer: Self-pay | Admitting: Thoracic Surgery (Cardiothoracic Vascular Surgery)

## 2020-06-07 ENCOUNTER — Encounter: Payer: Self-pay | Admitting: Thoracic Surgery (Cardiothoracic Vascular Surgery)

## 2020-06-07 ENCOUNTER — Other Ambulatory Visit: Payer: Self-pay

## 2020-06-07 VITALS — BP 115/55 | HR 74 | Temp 97.7°F | Resp 20 | Ht 59.0 in | Wt 158.7 lb

## 2020-06-07 DIAGNOSIS — Z951 Presence of aortocoronary bypass graft: Secondary | ICD-10-CM

## 2020-06-07 NOTE — Patient Outreach (Signed)
Screened for potential [facility] Care Management needs as a benefit of  [employer].  Mrs. Domenico is currently receiving skilled therapy at [facility].  Writer attended telephonic interdisciplinary team meeting to assess for disposition needs and transition plan for resident.   Facility reports member is from home alone. She is s/p CABG. Requires increased assist with ADLs.  Will continue to follow transition plans and for potential [facility] Care Management services while member resides in SNF.   Raiford Noble, MSN-Ed, RN,BSN [facility] Post Acute Care Coordinator [phone] [facility]) [phone]  (Toll free office)

## 2020-06-07 NOTE — Progress Notes (Signed)
      [address]       [place] [address]             [phone]        [name] [facility] Medical Record #[id number] Date of Birth: September 11, 1941  Referring: Antoine Poche, MD Primary Care: Wandra Feinstein, MD Primary Cardiologist:Branch, Christiane Ha, MD  Reason for visit:   follow-up  History of Present Illness:     [name] comes in for her first follow-up appointment. She is currently at a rehab center and doing well. She has some chest wall incisional pain.  Physical Exam: BP (!) 115/55 (BP Location: Left Arm, Patient Position: Sitting, Cuff Size: Normal)   Pulse 74   Temp 97.7 F (36.5 C) (Temporal)   Resp 20   Ht 4\' 11"  (1.499 m)   Wt 158 lb 11.2 oz (72 kg)   SpO2 94% Comment: RA  BMI 32.05 kg/m   Alert NAD Incision clean.  Sternum stable Abdomen soft, ND No peripheral edema       Assessment / Plan:   79 year old female status post CABG. Doing well With follow-up in 1 month with a chest x-ray.   70 06/07/2020 1:59 PM

## 2020-06-07 NOTE — Progress Notes (Signed)
Cardiology Office Note  Date: 06/08/2020   ID: [name] [name], DOB May 26, 1941, MRN [id number]  PCP:  Wandra Feinstein, MD  Cardiologist:  Dina Rich, MD Electrophysiologist:  None   Chief Complaint: F/U S/P CABG x 3 , CAD, HTN , HLD, DM2, COPD, CKD 3b (s/p left nephrectomy 1970's)  History of Present Illness: [name] [name] is a 79 y.o. female with a history of CAD status post CABG x3, DM type II, HLD, HTN, COPD, CKD stage IIIb (status post left nephrectomy in 1970s)  [facility]ed to [facility] ED with complaints of chest pain on 05/17/2020.  Stress test was abnormal and deemed a high risk.  Patient was transferred to [facility] on 05/19/2020.  Left heart cath performed 05/21/2020 demonstrating critical three-vessel disease including 95% occlusion of proximal LAD, 100% left circumflex, M1 and M2 with collaterals, and mid RCA with collaterals  CABG x3 05/22/2020 (LIMA-LAD,  SVG distal RCA, SVG-OM1 (greater saphenous vein harvest on the right)  Had post op follow up with CVTS 06/07/2020 Dr Cliffton Asters. Doing well having some chest wall pain. Currently at rehab center. Incision was clean, sternum was stable. Follow up CXR ordered for 1 month.  Patient here for follow-up status post recent bypass surgery. Followed up yesterday with Dr. Cliffton Asters. She continues with some chest wall pain secondary to median sternotomy. Denies any other issues other than an area on her right hip she describes as similar to a pressure sore. She states the personnel at [facility] rehab are treating it with some type of cream. She is sitting in a wheelchair and embracing "heart pillow". States she feels weak from her surgery. She had been in the hospital since June 3 per her statement. States her chest is healing well as well as her vein harvest site on her right leg. He denies any anginal or exertional symptoms other than some mild shortness of breath. She is undergoing rehab and has walked some this morning without  issue.  Past Medical History:  Diagnosis Date  . Anemia, unspecified   . Anxiety disorder, unspecified   . Atherosclerotic heart disease of native coronary artery without angina pectoris   . Chronic obstructive pulmonary disease, unspecified (HCC)   . Diabetes mellitus   . Gastro-esophageal reflux disease with esophagitis   . Heart disease   . Hypertension   . Hypertensive chronic kidney disease with stage 1 through stage 4 chronic kidney disease, or unspecified chronic kidney disease   . Hypertensive heart disease without heart failure   . Major depressive disorder, single episode, unspecified   . Pneumonia   . Renal disorder   . Renal insufficiency   . Shortness of breath   . Type 2 diabetes mellitus with diabetic nephropathy (HCC)   . Type 2 diabetes mellitus with hyperglycemia (HCC)   . Unspecified abdominal hernia without obstruction or gangrene   . Unspecified osteoarthritis, unspecified site     Past Surgical History:  Procedure Laterality Date  . CORONARY ARTERY BYPASS GRAFT N/A 05/22/2020   Procedure: CORONARY ARTERY BYPASS GRAFTING (CABG) x3 LIMA TO LAD, SVG TO OM1, SVG TO DISTAL RIGHT;  Surgeon: Corliss Skains, MD;  Location: [facility] OR;  Service: Open Heart Surgery;  Laterality: N/A;  . LEFT HEART CATH AND CORONARY ANGIOGRAPHY N/A 05/21/2020   Procedure: LEFT HEART CATH AND CORONARY ANGIOGRAPHY;  Surgeon: Swaziland, Peter M, MD;  Location: [facility] INVASIVE CV LAB;  Service: Cardiovascular;  Laterality: N/A;  . NEPHRECTOMY    . TEE  WITHOUT CARDIOVERSION N/A 05/22/2020   Procedure: TRANSESOPHAGEAL ECHOCARDIOGRAM (TEE);  Surgeon: Corliss Skains, MD;  Location[facility] OR;  Service: Open Heart Surgery;  Laterality: N/A;  . VEIN HARVEST Right 05/22/2020   Procedure: Open Vein Harvest;  Surgeon: Corliss Skains, MD;  Location: [facility] OR;  Service: Open Heart Surgery;  Laterality: Right;  . VENTRAL HERNIA REPAIR  09/01/2017   mistaken entry    Current Outpatient Medications   Medication Sig Dispense Refill  . aspirin EC 325 MG EC tablet Take 1 tablet (325 mg total) by mouth daily. 30 tablet 0  . atorvastatin (LIPITOR) 40 MG tablet Take 1 tablet (40 mg total) by mouth daily. 30 tablet 1  . Biotin w/ Vitamins C & E (HAIR SKIN & NAILS GUMMIES PO) Take 2 each by mouth every morning.    . fish oil-omega-3 fatty acids 1000 MG capsule Take 1 g by mouth 2 (two) times daily.    Marland Kitchen linagliptin (TRADJENTA) 5 MG TABS tablet Take 1 tablet (5 mg total) by mouth daily. 30 tablet   . metoprolol tartrate (LOPRESSOR) 25 MG tablet Take 0.5 tablets (12.5 mg total) by mouth 2 (two) times daily. 30 tablet 1  . Multiple Vitamin (MULITIVITAMIN WITH MINERALS) TABS Take 1 tablet by mouth daily.    . Multiple Vitamins-Minerals (AIRBORNE GUMMIES PO) Take 1 each by mouth every morning.    . traMADol (ULTRAM) 50 MG tablet Take 1 tablet (50 mg total) by mouth every 6 (six) hours as needed for moderate pain. 24 tablet 0   No current facility-administered medications for this visit.   Allergies:  Contrast media [iodinated diagnostic agents], Morphine and related, Other, Penicillins, and Codeine   Social History: The patient  reports that she has never smoked. She has never used smokeless tobacco. She reports that she does not drink alcohol and does not use drugs.   Family History: The patient's family history includes CAD in her father; Heart failure in her sister.   ROS:  Please see the history of present illness. Otherwise, complete review of systems is positive for none.  All other systems are reviewed and negative.   Physical Exam: VS:  BP (!) 100/58   Pulse 61   Ht 4\' 11"  (1.499 m)   Wt 147 lb (66.7 kg)   SpO2 98%   BMI 29.69 kg/m , BMI Body mass index is 29.69 kg/m.  Wt Readings from Last 3 Encounters:  06/08/20 147 lb (66.7 kg)  06/07/20 158 lb 11.2 oz (72 kg)  06/01/20 156 lb (70.8 kg)    General: Patient appears comfortable at rest. Neck: Supple, no elevated JVP or carotid  bruits, no thyromegaly. Lungs: Clear to auscultation, nonlabored breathing at rest. Cardiac: Regular rate and rhythm, no S3 or significant systolic murmur, no pericardial rub. Extremities: No pitting edema, distal pulses 2+. Skin: Warm and dry. Healing median sternotomy scar as well as right leg vein harvesting site clean and dry without signs of infection. Musculoskeletal: No kyphosis. Neuropsychiatric: Alert and oriented x3, affect grossly appropriate.  ECG:  EKG May 24, 2019 showed normal sinus rhythm, nonspecific T wave abnormality heart rate of 85.  Recent Labwork: 11/17/2019: TSH 2.02 05/17/2020: ALT 18; AST 17 05/23/2020: Magnesium 2.2 05/25/2020: Hemoglobin 10.4; Platelets 169 05/27/2020: BUN 26; Potassium 5.0; Sodium 138 05/30/2020: Creatinine, Ser 1.41     Component Value Date/Time   CHOL 159 05/18/2020 0341   TRIG 286 (H) 05/18/2020 0341   HDL 29 (L) 05/18/2020 0341  CHOLHDL 5.5 05/18/2020 0341   VLDL 57 (H) 05/18/2020 0341   LDLCALC 73 05/18/2020 0341   LDLCALC 109 (H) 03/07/2020 1137    Other Studies Reviewed Today:  Echo 05/18/2020 1. Left ventricular ejection fraction, by estimation, is 55 to 60%. The left ventricle has normal function. The left ventricle has no regional wall motion abnormalities. There is mild left ventricular hypertrophy. Left ventricular diastolic parameters are indeterminate. 2. Right ventricular systolic function is normal. The right ventricular size is normal. 3. Left atrial size was mildly dilated. 4. The mitral valve is normal in structure. Trivial mitral valve regurgitation. No evidence of mitral stenosis. 5. The aortic valve has an indeterminant number of cusps. Aortic valve regurgitation is not visualized. No aortic stenosis is present. 6. The inferior vena cava is normal in size with greater than 50% respiratory variability, suggesting right atrial pressure of 3 mmHg.  Cardiac catheterization  05/21/2020 Conclusion    Prox LAD to Mid  LAD lesion is 95% stenosed.  Ost Cx to Prox Cx lesion is 100% stenosed.  Prox RCA lesion is 90% stenosed.  Mid RCA lesion is 100% stenosed.  LV end diastolic pressure is normal.   1. Critical 3 vessel occlusive CAD.     - 95% proximal to mid LAD    -100% proximal LCx. OM1 and OM2 fill by left to left collaterals    - 100% mid RCA. Distal vessel fills by right to right and left to right collaterals 2. Normal LVEDP  Plan: CT surgery consultation for CABG Diagnostic Dominance: Right    ABI and Carotid studies 05/21/2020 Summary:  Right ABI: Resting right ankle-brachial index indicates moderate right lower extremity arterial disease. The right toe-brachial index is abnormal. Left ABI: Resting left ankle-brachial index indicates moderate left lower extremity arterial disease. The left toe-brachial index is abnormal. Left Upper Extremity: Doppler waveforms decrease <50% w left radial compression. Doppler waveform obliterate with left ulnar compression. Right Rt Pressure (mmHg) Index Waveform Comment Brachial restricted, radial cath today PTA 54 0.43 biphasic DP 80 0.63 biphasic Great Toe 33 0.26 Left Lt Pressure (mmHg) Index Waveform Comment Brachial 127 triphasic PTA 84 0.66 biphasic DP 93 0.73 biphasic [place]e 57 0.45 ABI/TBI Today's ABI/TBI Previous ABI/TBI Right 0.63 Left 0.73 Site Pressure Index Doppler Comments Brachial restricted, radial cath today Radial restricted, radial cath today Ulnar restricted, radial cath today Palmar Arch restricted, radial cath today Site Pressure Index Doppler Comments Brachial 127 triphasic Radial triphasic Ulnar triphasic Right Carotid: The extracranial vessels were near-normal with only minimal wall thickening or plaque. Left Carotid: The extracranial vessels were near-normal with only minimal wall thickening or plaque. Vertebrals: Bilateral vertebral arteries demonstrate antegrade flow. Subclavians: Normal flow  hemodynamics were seen in bilateral subclavian arteries.   TEE Post op  POST-OP IMPRESSIONS . Left Ventricle: The left ventricle is unchanged from pre-bypass. . Right Ventricle: The right ventricle appears unchanged from pre-bypass. . Aorta: The aorta appears unchanged from pre-bypass. . Left Atrium: The left atrium appears unchanged from pre-bypass. . Left Atrial Appendage: The left atrial appendage appears unchanged from pre-bypass. . Aortic Valve: The aortic valve appears unchanged from pre-bypass. . Mitral Valve: The mitral valve appears unchanged from pre-bypass. . Tricuspid Valve: The tricuspid valve appears unchanged from pre-bypass. . Interatrial Septum: The interatrial septum appears unchanged from pre-bypass. . Interventricular Septum: The interventricular septum appears unchanged from pre-bypass. . Pericardium: The pericardium appears unchanged from pre-bypass.  PRE-OP FINDINGS Aortic Valve: The aortic valve is normal in structure.  Aortic valve regurgitation was not visualized by color flow Doppler. There is no evidence of aortic valve stenosis. Pulmonic Valve: The pulmonic valve was normal in structure. Pulmonic valve regurgitation is not visualized by color flow Doppler. Aorta: There is evidence of layered immobile plaque in the aortic arch and descending aorta; Grade III, measuring 3-39mm in size. Pulmonary Artery: The pulmonary artery is of normal size. Hoy Morn MD   Assessment and Plan:  1. S/P coronary artery bypass graft x 3   2. CAD in native artery   3. Essential hypertension   4. Mixed hyperlipidemia   5. Chronic obstructive pulmonary disease, unspecified COPD type ([place])    1. S/P coronary artery bypass graft x 3 CABG x3 05/22/2020 (LIMA-LAD,  SVG distal RCA, SVG-OM1 (greater saphenous vein harvest on the right) healing median sternotomy scar and right leg vein harvesting scar.  Undergoing physical rehab at [facility] rehabilitation center.  2. CAD in  native artery Recent cardiac catheterization with significant three-vessel disease. Status post bypass as mentioned above. Denies any anginal symptoms. Undergoing rehab status post by past surgery. Continue aspirin 325 mg daily, metoprolol 12.5 mg p.o. twice daily.  3. Essential hypertension Pressure soft today but patient is asymptomatic. Blood pressure 100/58. Patient states she was up walking with rehab this morning without any dizziness or presyncopal symptoms.  4. Mixed hyperlipidemia Continue atorvastatin 40 mg daily.   5. Chronic obstructive pulmonary disease, unspecified COPD type ([place]) Patient denies any significant dyspnea. She is not very active recently secondary to bypass surgery. She is undergoing rehab at [facility] rehab center. O2 saturation on room air this morning 98%.   Medication Adjustments/Labs and Tests Ordered: Current medicines are reviewed at length with the patient today.  Concerns regarding medicines are outlined above.   Disposition: Follow-up with Dr. Harl Bowie or APP 3 months  Signed, Levell July, NP 06/08/2020 9:41 AM    [facility] at [place], [place], [place] [address] Phone: [phone]; Fax: [phone]

## 2020-06-08 ENCOUNTER — Encounter: Payer: Self-pay | Admitting: Family Medicine

## 2020-06-08 ENCOUNTER — Ambulatory Visit (INDEPENDENT_AMBULATORY_CARE_PROVIDER_SITE_OTHER): Payer: Medicare Other | Admitting: Family Medicine

## 2020-06-08 VITALS — BP 100/58 | HR 61 | Ht 59.0 in | Wt 147.0 lb

## 2020-06-08 DIAGNOSIS — I10 Essential (primary) hypertension: Secondary | ICD-10-CM

## 2020-06-08 DIAGNOSIS — E782 Mixed hyperlipidemia: Secondary | ICD-10-CM

## 2020-06-08 DIAGNOSIS — I2510 Atherosclerotic heart disease of native coronary artery without angina pectoris: Secondary | ICD-10-CM

## 2020-06-08 DIAGNOSIS — J449 Chronic obstructive pulmonary disease, unspecified: Secondary | ICD-10-CM | POA: Diagnosis not present

## 2020-06-08 DIAGNOSIS — Z951 Presence of aortocoronary bypass graft: Secondary | ICD-10-CM

## 2020-06-08 NOTE — Patient Instructions (Addendum)
Your physician recommends that you schedule a follow-up appointment in: 3 MONTHS WITH DR MCDOWELL  Your physician recommends that you continue on your current medications as directed. Please refer to the Current Medication list given to you today.   Heart-Healthy Eating Plan Heart-healthy meal planning includes:  Eating less unhealthy fats.  Eating more healthy fats.  Making other changes in your diet. Talk with your doctor or a diet specialist (dietitian) to create an eating plan that is right for you. What is my plan? Your doctor may recommend an eating plan that includes:  Total fat: ______% or less of total calories a day.  Saturated fat: ______% or less of total calories a day.  Cholesterol: less than _________mg a day. What are tips for following this plan? Cooking Avoid frying your food. Try to bake, boil, grill, or broil it instead. You can also reduce fat by:  Removing the skin from poultry.  Removing all visible fats from meats.  Steaming vegetables in water or broth. Meal planning   At meals, divide your plate into four equal parts: ? Fill one-half of your plate with vegetables and green salads. ? Fill one-fourth of your plate with whole grains. ? Fill one-fourth of your plate with lean protein foods.  Eat 4-5 servings of vegetables per day. A serving of vegetables is: ? 1 cup of raw or cooked vegetables. ? 2 cups of raw leafy greens.  Eat 4-5 servings of fruit per day. A serving of fruit is: ? 1 medium whole fruit. ?  cup of dried fruit. ?  cup of fresh, frozen, or canned fruit. ?  cup of 100% fruit juice.  Eat more foods that have soluble fiber. These are apples, broccoli, carrots, beans, peas, and barley. Try to get 20-30 g of fiber per day.  Eat 4-5 servings of nuts, legumes, and seeds per week: ? 1 serving of dried beans or legumes equals  cup after being cooked. ? 1 serving of nuts is  cup. ? 1 serving of seeds equals 1 tablespoon. General  information  Eat more home-cooked food. Eat less restaurant, buffet, and fast food.  Limit or avoid alcohol.  Limit foods that are high in starch and sugar.  Avoid fried foods.  Lose weight if you are overweight.  Keep track of how much salt (sodium) you eat. This is important if you have high blood pressure. Ask your doctor to tell you more about this.  Try to add vegetarian meals each week. Fats  Choose healthy fats. These include olive oil and canola oil, flaxseeds, walnuts, almonds, and seeds.  Eat more omega-3 fats. These include salmon, mackerel, sardines, tuna, flaxseed oil, and ground flaxseeds. Try to eat fish at least 2 times each week.  Check food labels. Avoid foods with trans fats or high amounts of saturated fat.  Limit saturated fats. ? These are often found in animal products, such as meats, butter, and cream. ? These are also found in plant foods, such as palm oil, palm kernel oil, and coconut oil.  Avoid foods with partially hydrogenated oils in them. These have trans fats. Examples are stick margarine, some tub margarines, cookies, crackers, and other baked goods. What foods can I eat? Fruits All fresh, canned (in natural juice), or frozen fruits. Vegetables Fresh or frozen vegetables (raw, steamed, roasted, or grilled). Green salads. Grains Most grains. Choose whole wheat and whole grains most of the time. Rice and pasta, including brown rice and pastas made with whole wheat.  Meats and other proteins Lean, well-trimmed beef, veal, pork, and lamb. Chicken and [place] without skin. All fish and shellfish. Wild duck, rabbit, pheasant, and venison. Egg whites or low-cholesterol egg substitutes. Dried beans, peas, lentils, and tofu. Seeds and most nuts. Dairy Low-fat or nonfat cheeses, including ricotta and mozzarella. Skim or 1% milk that is liquid, powdered, or evaporated. Buttermilk that is made with low-fat milk. Nonfat or low-fat yogurt. Fats and  oils Non-hydrogenated (trans-free) margarines. Vegetable oils, including soybean, sesame, sunflower, olive, peanut, safflower, corn, canola, and cottonseed. Salad dressings or mayonnaise made with a vegetable oil. Beverages Mineral water. Coffee and tea. Diet carbonated beverages. Sweets and desserts Sherbet, gelatin, and fruit ice. Small amounts of dark chocolate. Limit all sweets and desserts. Seasonings and condiments All seasonings and condiments. The items listed above may not be a complete list of foods and drinks you can eat. Contact a dietitian for more options. What foods should I avoid? Fruits Canned fruit in heavy syrup. Fruit in cream or butter sauce. Fried fruit. Limit coconut. Vegetables Vegetables cooked in cheese, cream, or butter sauce. Fried vegetables. Grains Breads that are made with saturated or trans fats, oils, or whole milk. Croissants. Sweet rolls. Donuts. High-fat crackers, such as cheese crackers. Meats and other proteins Fatty meats, such as hot dogs, ribs, sausage, bacon, rib-eye roast or steak. High-fat deli meats, such as salami and bologna. Caviar. Domestic duck and goose. Organ meats, such as liver. Dairy Cream, sour cream, cream cheese, and creamed cottage cheese. Whole-milk cheeses. Whole or 2% milk that is liquid, evaporated, or condensed. Whole buttermilk. Cream sauce or high-fat cheese sauce. Yogurt that is made from whole milk. Fats and oils Meat fat, or shortening. Cocoa butter, hydrogenated oils, palm oil, coconut oil, palm kernel oil. Solid fats and shortenings, including bacon fat, salt pork, lard, and butter. Nondairy cream substitutes. Salad dressings with cheese or sour cream. Beverages Regular sodas and juice drinks with added sugar. Sweets and desserts Frosting. Pudding. Cookies. Cakes. Pies. Milk chocolate or white chocolate. Buttered syrups. Full-fat ice cream or ice cream drinks. The items listed above may not be a complete list of foods  and drinks to avoid. Contact a dietitian for more information. Summary  Heart-healthy meal planning includes eating less unhealthy fats, eating more healthy fats, and making other changes in your diet.  Eat a balanced diet. This includes fruits and vegetables, low-fat or nonfat dairy, lean protein, nuts and legumes, whole grains, and heart-healthy oils and fats. This information is not intended to replace advice given to you by your health care provider. Make sure you discuss any questions you have with your health care provider. Document Revised: 02/04/2018 Document Reviewed: 01/08/2018 Elsevier Patient Education  2020 [employer].   Thank you for choosing [facility]!!

## 2020-06-14 DIAGNOSIS — R41841 Cognitive communication deficit: Secondary | ICD-10-CM | POA: Diagnosis not present

## 2020-06-14 DIAGNOSIS — D62 Acute posthemorrhagic anemia: Secondary | ICD-10-CM | POA: Diagnosis not present

## 2020-06-14 DIAGNOSIS — I129 Hypertensive chronic kidney disease with stage 1 through stage 4 chronic kidney disease, or unspecified chronic kidney disease: Secondary | ICD-10-CM | POA: Diagnosis not present

## 2020-06-14 DIAGNOSIS — R2689 Other abnormalities of gait and mobility: Secondary | ICD-10-CM | POA: Diagnosis not present

## 2020-06-14 DIAGNOSIS — Z48812 Encounter for surgical aftercare following surgery on the circulatory system: Secondary | ICD-10-CM | POA: Diagnosis not present

## 2020-06-14 DIAGNOSIS — F329 Major depressive disorder, single episode, unspecified: Secondary | ICD-10-CM | POA: Diagnosis not present

## 2020-06-14 DIAGNOSIS — F419 Anxiety disorder, unspecified: Secondary | ICD-10-CM | POA: Diagnosis not present

## 2020-06-14 DIAGNOSIS — M6281 Muscle weakness (generalized): Secondary | ICD-10-CM | POA: Diagnosis not present

## 2020-06-14 DIAGNOSIS — I25119 Atherosclerotic heart disease of native coronary artery with unspecified angina pectoris: Secondary | ICD-10-CM | POA: Diagnosis not present

## 2020-06-14 DIAGNOSIS — E785 Hyperlipidemia, unspecified: Secondary | ICD-10-CM | POA: Diagnosis not present

## 2020-06-14 DIAGNOSIS — E669 Obesity, unspecified: Secondary | ICD-10-CM | POA: Diagnosis not present

## 2020-06-14 DIAGNOSIS — J449 Chronic obstructive pulmonary disease, unspecified: Secondary | ICD-10-CM | POA: Diagnosis not present

## 2020-06-14 DIAGNOSIS — E1165 Type 2 diabetes mellitus with hyperglycemia: Secondary | ICD-10-CM | POA: Diagnosis not present

## 2020-06-20 ENCOUNTER — Telehealth: Payer: Self-pay | Admitting: Family Medicine

## 2020-06-20 ENCOUNTER — Other Ambulatory Visit: Payer: Self-pay | Admitting: Thoracic Surgery (Cardiothoracic Vascular Surgery)

## 2020-06-20 ENCOUNTER — Telehealth: Payer: Self-pay

## 2020-06-20 MED ORDER — TRAMADOL HCL 50 MG PO TABS: 50.0000 mg | ORAL_TABLET | Freq: Four times a day (QID) | ORAL | 0 refills | Status: DC | PRN

## 2020-06-20 NOTE — Telephone Encounter (Signed)
Spoke with patient & pharmacy - needs these medications filled till she establishes with new pcp - will be seeing Tereasa Coop, NP on 07/26/2020.  Dr. Judee Clara no longer available.    Will forward message to provider for approval.

## 2020-06-20 NOTE — Telephone Encounter (Signed)
Pt's daughter [name] calls office to request a refill of pt's Tramadol. Pt is s/p CABG by Dr. Cliffton Asters on 05/22/20. Pt denies acute chest pain or radiating pain, no sob. Reports that her chest is sore. Consulted Dr. Cliffton Asters; states he will submit refill. Pt notified.

## 2020-06-20 NOTE — Telephone Encounter (Signed)
[facility] pharmacy called stating that patient needs refills on following  Lipitor Tradjenta Lopressor  Patient has upcoming appointment with Nena Polio, NP   Pharmacy states that patient will run out of medication before upcoming appointment.

## 2020-06-21 ENCOUNTER — Other Ambulatory Visit: Payer: Self-pay | Admitting: *Deleted

## 2020-06-21 MED ORDER — METOPROLOL TARTRATE 25 MG PO TABS: 12.5000 mg | ORAL_TABLET | Freq: Two times a day (BID) | ORAL | 1 refills | Status: DC

## 2020-06-21 MED ORDER — LINAGLIPTIN 5 MG PO TABS: 5.0000 mg | ORAL_TABLET | Freq: Every day | ORAL | 1 refills | Status: DC

## 2020-06-21 MED ORDER — ATORVASTATIN CALCIUM 40 MG PO TABS: 40.0000 mg | ORAL_TABLET | Freq: Every day | ORAL | 1 refills | Status: DC

## 2020-06-21 NOTE — Telephone Encounter (Signed)
You can refill those prescription til she sees the new provider. Thanks

## 2020-06-21 NOTE — Patient Outreach (Signed)
[facility] Post-Acute Care Coordinator follow up  Confirmed with [facility] SN[facility] SW that Mrs. Spoon transitioned home on 06/20/20 with [facility].   Telephone call made to Mrs. Lacombe [phone]. No answer. Unable to leave voicemail message. Telephone call made to daughter/DPR [name] [phone]. Patient identifiers confirmed. [name] states member is staying with her. Spoke with Mrs. Sturdevant who reports she is living with her daughter [name] until she recovers and is doing better.   Explained [facility] Care Management services. Mrs. Kimrey is agreeable. States her new PCP will be Willaim Sheng, NP with [facility].  Mrs. Ponciano needs assist in securing an earlier appointment with Willaim Sheng, NP. Mrs. Lamons's appointment with Cardiothoracic Surgeon Dr. Cliffton Asters is scheduled for July 30th at 1130 am. Mrs. Stegenga also states she will need assistance with transportation to MD appointments.   Mrs. Ishii states she was only given 10 days worth of medicine by the MD at [facility]. States they are trying to get their medicine straighten out now. Tereasa Coop, NP will be a new PCP for member. States Dr. Judee Clara retired.   Mrs. Funderburk's new patient appointment is not until August. Writer will call during office hours tomorrow to see if a sooner appointment can be scheduled for post SNF vist with Tereasa Coop, NP. [facility] office hours are 8a to 4p Monday-Thursday and 8a-12p on Friday.  Mrs. Rau also states she has had some medication adjustments. States she needs assistance in getting her medications sorted out. Agreeable to [name] Pharmacist referral as well.   Will make referrals for [facility] Care Management RNCM for complex case management, [facility] LCSW for transportation assistance, [facility] for medication review due to med adjustments and member needing further assistance.   Will plan on calling Tereasa Coop, NP office in the morning to make sooner follow up appointment  on Mrs. Grimes's behalf.   Raiford Noble, MSN-Ed, RN,BSN [facility] Post Acute Care Coordinator [phone] [facility]) [phone]  (Toll free office)

## 2020-06-21 NOTE — Telephone Encounter (Signed)
Done

## 2020-06-22 ENCOUNTER — Other Ambulatory Visit: Payer: Self-pay | Admitting: *Deleted

## 2020-06-22 ENCOUNTER — Telehealth: Payer: Self-pay

## 2020-06-22 ENCOUNTER — Encounter: Payer: Self-pay | Admitting: *Deleted

## 2020-06-22 DIAGNOSIS — I519 Heart disease, unspecified: Secondary | ICD-10-CM

## 2020-06-22 NOTE — Telephone Encounter (Signed)
Pt's daughter calls to report that pharmacy has not received the Tramadol refill Rx that Dr. Cliffton Asters submitted on 06/20/20. TC to [facility] and verified that they have not received the Rx. Called Rx in exactly as ordered by Dr. Cliffton Asters on 06/20/20. Notified pt/dgt.

## 2020-06-22 NOTE — Patient Outreach (Signed)
[facility]) Care Management  06/22/2020  [name] [name] March 03, 1941 [id number]   CSW made an initial attempt to try and contact patient today to perform the phone assessment, as well as assess and assist with social work needs and services, without success.  A HIPAA compliant message was left for patient on voicemail.  CSW is currently awaiting a return call.  CSW will make a second outreach attempt within the next 3-4 business days, if a return call is not received from patient in the meantime.  CSW will also mail a Patient Unsuccessful Outreach Letter to patient's home, requesting that patient contact CSW if she is interested in receiving social work services through CSW with [facility], [facility].  [name] [name], BSW, MSW, LCSW  Licensed Restaurant manager, fast food Health System  Mailing [place] [address], [place], [place] [address] Physical Address-[address], [place], [place] [address] Toll Free Main # [phone] Fax # [phone] Cell # [phone]  Office # [phone] [name] [name]

## 2020-06-22 NOTE — Patient Outreach (Addendum)
[facility] Post- Acute Care Coordinator follow up  0907 am Telephone call made to [facility] to schedule a sooner appointment. However unable to speak with front desk personnel. Left voicemail message.   Telephone call made to [name] (daughter/DPR) [phone] to make aware that writer was unable to speak with office staff to schedule a sooner appointment with Tereasa Coop, NP.   Current appointment is not until August. Mrs. Doolan will be new to the practice since Dr. Judee Clara has since retired. Mrs. Wilcoxen is high risk for readmission. MD from [facility] SN[facility] only provided 10 days worth of prescription per Mrs. Torrico.    Provided [name] with [facility] telephone number and encouraged her to keep calling today to schedule a sooner  appointment. Made Cindy aware the office closes at 12 noon today.   Will make appropriate [facility] Care Management referrals to [facility] RNCM for complex case management and [facility] LCSW for transportation assistance. [facility]  Pharmacy team for medication concerns.  Addendum 3664 am: Telephone call received from [name] (daughter/DPR) who states [facility] called and made her aware that Dr. Elise Benne office (cardiology) provided prescriptions to last until [facility] PCP appointment in August. [name] states she will still try to get sooner appointment scheduled with PCP since member just discharged from SNF. Discussed that Clinical research associate will hold of [facility] for now. Will ask [facility] RNCM to assess need for [facility] referral.    Raiford Noble, MSN-Ed, RN,BSN [facility] Post Acute Care Coordinator [phone] [facility]) [phone]  (Toll free office)

## 2020-06-26 ENCOUNTER — Ambulatory Visit: Payer: Medicare Other | Admitting: *Deleted

## 2020-06-27 ENCOUNTER — Other Ambulatory Visit: Payer: Self-pay | Admitting: *Deleted

## 2020-06-27 ENCOUNTER — Encounter: Payer: Self-pay | Admitting: *Deleted

## 2020-06-27 NOTE — Progress Notes (Signed)
LVM for pt daughter but no answer

## 2020-06-27 NOTE — Patient Outreach (Signed)
[facility]) Care Management  06/27/2020  [name] [name] 04-24-1941 [id number]    CSW received an incoming call from patient and patient's daughter, [name] [name] today, in response to the HIPAA compliant messages left on voicemail for patient and [name] [name] earlier today, as well as on Friday, June 22, 2020.  CSW was able to perform the initial phone assessment on patient, in addition to assessing and assisting with social work needs and services.  CSW introduced self, explained role and types of services provided through [facility] ([facility]).  CSW further explained to patient and [name] [name] that CSW works with Marthenia Rolling, [place], also with [facility].  CSW then explained the reason for the call, indicating that [name] [name] thought that patient would benefit from social work services and resources to assist with transportation to and from physician appointment.  CSW obtained two HIPAA compliant identifiers from patient and [name] [name], which included patient's name and date of birth.  Patient and [name] [name] admitted that patient will need transportation assistance to and from physician appointments, as [name] [name] plans to return to work full-time, as soon as possible.  CSW explained to patient and [name] [name], that because patient is an active Adult Medicaid recipient, through the [employer], patient is eligible to receive transportation services through [employer].  CSW went on to explain to patient and [name] [name] that all patient will need to do is contact her Adult Medicaid case worker to arrange transportation.  Patient and [name] [name] admitted that they do not know the name of patient's Medicaid case worker, encouraging CSW to contact [name] [name]), Social Worker at [facility] that worked with patient and [name] [name] while  patient was recently hospitalized.  CSW attempted to contact [name] [name], but received an automated recording that she will be out of the office until Monday, July 02, 2020.  CSW then agreed to contact several transportation agencies to inquire about transportation services.  These agencies include all of the following:  [employer] ([place] - [phone]; [employer] - [phone]; [place] - [phone].  CSW was able to confirm with all three of these agencies that they provide door-to-door transportation services to all Adult Medicaid recipients, requiring at least 5 days notice in order for them to arrange transportation assistance.  CSW provided this information to patient and [name] [name], encouraging them to go ahead and contact 1 of the 3 agencies to arrange transportation services for patient's upcoming physician appointments.  CSW further explained to patient and [name] [name] that if they contact 1 of the 3 agencies today, patient will be guaranteed transportation to her appointments with [name] [name], Cardiologist, scheduled for Wednesday, July 04, 2020, as well as her appointment with [name] [name], Cardiac and Thoracic Surgeon, scheduled for Friday, July 13, 2020.  Patient and [name] [name] voiced understanding and were agreeable to this plan.  CSW will perform a case closure on patient, as all goals of treatment have been met from social work standpoint and no additional social work needs have been identified at this time.  CSW will notify patient's Telephonic RNCM with [facility], [name] [name] of CSW's plans to close patient's case.  CSW will fax an update to patient's Primary Care Physician, [name] [name] to ensure that she is aware  of CSW's involvement with patient's plan of care.  CSW will also route a Physician Case Closure Letter to [name] [name].  CSW was  able to confirm that patient and [name] [name] have the correct contact information for CSW, encouraging them to contact CSW directly if additional social work needs arise in the near future.  [name] [name] assured CSW that patient will continue to reside with her so that she can provide care and supervision, as well as assistance with activities of daily living.  [name] [name], BSW, MSW, LCSW  Licensed Education officer, environmental Health System  Mailing [place] [address] [place], [place] [address] Physical Address-[name], [place],  [address] Toll Free Main # [phone] Fax # [phone] Cell # [phone]  Office # [phone] [email or web address]

## 2020-06-27 NOTE — Patient Outreach (Signed)
[facility]) Care Management  06/27/2020  [name] [name] Feb 13, 1941 [id number]   CSW made a second attempt to try and contact patient and patient's daughter, [name] [name] today to perform the initial phone assessment, as well as assess and assist with social work needs and services, without success.  CSW left HIPAA compliant messages on three different voicemail's for patient and [name] [name], and is currently awaiting a return call.  CSW will make a third outreach attempt within the next 3-4 business days, if a return call is not received from patient or [name] [name] in the meantime.  Danford Bad, BSW, MSW, LCSW  Licensed Restaurant manager, fast food Health System  Mailing [place] [address] [place], [place] [address] Physical Address-[address], [place], [place] [address] Toll Free Main # [phone] Fax # [phone] Cell # [phone]  Office # [phone] [email or web address]

## 2020-07-03 ENCOUNTER — Ambulatory Visit: Payer: Self-pay | Admitting: *Deleted

## 2020-07-03 DIAGNOSIS — N1832 Chronic kidney disease, stage 3b: Secondary | ICD-10-CM | POA: Diagnosis not present

## 2020-07-03 DIAGNOSIS — E1165 Type 2 diabetes mellitus with hyperglycemia: Secondary | ICD-10-CM | POA: Diagnosis not present

## 2020-07-03 DIAGNOSIS — K2100 Gastro-esophageal reflux disease with esophagitis, without bleeding: Secondary | ICD-10-CM | POA: Diagnosis not present

## 2020-07-03 DIAGNOSIS — M1990 Unspecified osteoarthritis, unspecified site: Secondary | ICD-10-CM | POA: Diagnosis not present

## 2020-07-03 DIAGNOSIS — E782 Mixed hyperlipidemia: Secondary | ICD-10-CM | POA: Diagnosis not present

## 2020-07-03 DIAGNOSIS — Z905 Acquired absence of kidney: Secondary | ICD-10-CM | POA: Diagnosis not present

## 2020-07-03 DIAGNOSIS — Z7984 Long term (current) use of oral hypoglycemic drugs: Secondary | ICD-10-CM | POA: Diagnosis not present

## 2020-07-03 DIAGNOSIS — J449 Chronic obstructive pulmonary disease, unspecified: Secondary | ICD-10-CM | POA: Diagnosis not present

## 2020-07-03 DIAGNOSIS — K469 Unspecified abdominal hernia without obstruction or gangrene: Secondary | ICD-10-CM | POA: Diagnosis not present

## 2020-07-03 DIAGNOSIS — I2510 Atherosclerotic heart disease of native coronary artery without angina pectoris: Secondary | ICD-10-CM | POA: Diagnosis not present

## 2020-07-03 DIAGNOSIS — Z48812 Encounter for surgical aftercare following surgery on the circulatory system: Secondary | ICD-10-CM | POA: Diagnosis not present

## 2020-07-03 DIAGNOSIS — E1122 Type 2 diabetes mellitus with diabetic chronic kidney disease: Secondary | ICD-10-CM | POA: Diagnosis not present

## 2020-07-03 DIAGNOSIS — R41841 Cognitive communication deficit: Secondary | ICD-10-CM | POA: Diagnosis not present

## 2020-07-03 DIAGNOSIS — F329 Major depressive disorder, single episode, unspecified: Secondary | ICD-10-CM | POA: Diagnosis not present

## 2020-07-03 DIAGNOSIS — E669 Obesity, unspecified: Secondary | ICD-10-CM | POA: Diagnosis not present

## 2020-07-03 DIAGNOSIS — I129 Hypertensive chronic kidney disease with stage 1 through stage 4 chronic kidney disease, or unspecified chronic kidney disease: Secondary | ICD-10-CM | POA: Diagnosis not present

## 2020-07-03 DIAGNOSIS — E1121 Type 2 diabetes mellitus with diabetic nephropathy: Secondary | ICD-10-CM | POA: Diagnosis not present

## 2020-07-03 DIAGNOSIS — D62 Acute posthemorrhagic anemia: Secondary | ICD-10-CM | POA: Diagnosis not present

## 2020-07-03 DIAGNOSIS — Z951 Presence of aortocoronary bypass graft: Secondary | ICD-10-CM | POA: Diagnosis not present

## 2020-07-03 DIAGNOSIS — F419 Anxiety disorder, unspecified: Secondary | ICD-10-CM | POA: Diagnosis not present

## 2020-07-03 DIAGNOSIS — I25810 Atherosclerosis of coronary artery bypass graft(s) without angina pectoris: Secondary | ICD-10-CM | POA: Diagnosis not present

## 2020-07-04 ENCOUNTER — Ambulatory Visit: Payer: Medicare Other | Admitting: Cardiology

## 2020-07-04 ENCOUNTER — Other Ambulatory Visit: Payer: Self-pay | Admitting: *Deleted

## 2020-07-04 ENCOUNTER — Encounter: Payer: Self-pay | Admitting: *Deleted

## 2020-07-04 NOTE — Patient Outreach (Signed)
[facility]) Care Management  [facility] Care Manager  07/04/2020   [name] [name] 02/08/1941 [id number]  Subjective: Telephone call to patient's home / mobile number, spoke with patient and patient's daughter / designated party release [name] [name]).  Daughter stated patient's name, date of birth, and address.  Daughter placed phone on speaker, remainder of call, completed with daughter, and patient.  Discussed [facility] Care Management Medicare Transition of care follow up, patient and daughter voiced understanding, both are in agreement to follow up.  Daughter states she is familiar with [facility] Care Management services, medication refill issue and transportation issue has been resolved. Patient states she is doing good, following mobility restrictions, had follow up appointment with surgeon on 06/08/2020 while in rehab, appointment went well, surgical site is glued (new procedure/ technology used per patient), no stitches,  and will have next appointment on 07/13/2020.   States she will have follow up appointment with new primary provider on 07/26/2020.   Patient states she is aware of signs/ symptoms to report, how to reach provider if needed after hours, when to go to ED, and / or call 911, will call primary provider if issues arise, if sooner follow up appointment needed.  Patient states she has home health services and services are going well.  States she will be receiving a scale from home health in the near future.  States she has a follow up appointment with podiatrist on 07/05/2020 and will cardiologist on 08/31/2020.   Patient states she is able to manage self care and has assistance as needed. Patient voices understanding of medical diagnosis and treatment plan.   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 States her blood pressure is good, blood sugar today was 194,  and overall improving.   States she is not planning to obtain a Covid  vaccine.  States she is accessing her Medicare benefits as needed via member services number on back of card.  Patient states she does not have any Secretary/administrator,  transportation, [employer], or pharmacy needs at this time.  Patient in agreement to receive telephonic RNCM transition of care services.   States she is very appreciative of the follow up and is in agreement to receive [facility] Care Management information / services.      Objective: Per KPN (Knowledge Performance Now, point of care tool) and chart review, patient hospitalized 05/17/2020 - 06/01/2020 for CAD, status post CABG times 3 on 05/22/2020.   Patient at skilled nursing facility [facility]) 06/01/2020 - 06/22/2020 for rehab.   Patient also has a history of hypertension, diabetes, COPD, hyperlipidemia, anemia, Stage 3b Chronic Kidney Disease(status post Left  nephrectomy in 1970's complicated by abdominal wall hernia), and major depressive disorder.     Encounter Medications:  Outpatient Encounter Medications as of 07/04/2020  Medication Sig Note  . aspirin EC 325 MG EC tablet Take 1 tablet (325 mg total) by mouth daily.   Marland Kitchen atorvastatin (LIPITOR) 40 MG tablet Take 1 tablet (40 mg total) by mouth daily.   . Biotin w/ Vitamins C & E (HAIR SKIN & NAILS GUMMIES PO) Take 2 each by mouth every morning.   . fish oil-omega-3 fatty acids 1000 MG capsule Take 1 g by mouth 2 (two) times daily.   Marland Kitchen linagliptin (TRADJENTA) 5 MG TABS tablet Take 1 tablet (5 mg total) by mouth daily.   . metoprolol tartrate (LOPRESSOR) 25 MG tablet Take 0.5  tablets (12.5 mg total) by mouth 2 (two) times daily.   . Multiple Vitamin (MULITIVITAMIN WITH MINERALS) TABS Take 1 tablet by mouth daily.   . Multiple Vitamins-Minerals (AIRBORNE GUMMIES PO) Take 1 each by mouth every morning.   . traMADol (ULTRAM) 50 MG tablet Take 1 tablet (50 mg total) by mouth every 6 (six) hours as needed for moderate pain. (Patient not taking: Reported on 07/04/2020) 07/04/2020:  States has not needed.    No facility-administered encounter medications on file as of 07/04/2020.    Functional Status:  In your present state of health, do you have any difficulty performing the following activities: 05/18/2020  Hearing? N  Vision? N  Difficulty concentrating or making decisions? N  Walking or climbing stairs? Y  Dressing or bathing? N  Doing errands, shopping? N  Some recent data might be hidden    Fall/Depression Screening: Fall Risk  03/07/2020 07/14/2019  Falls in the past year? 0 (No Data)  Comment - Emmi Telephone Survey: data to providers prior to load  Number falls in past yr: 0 (No Data)  Comment - Emmi Telephone Survey Actual Response =   Follow up Falls evaluation completed -   PHQ 2/9 Scores 07/04/2020 06/27/2020 03/07/2020  PHQ - 2 Score 0 1 1    Assessment: Received Medicare [facility] Care Management Post Acute Care Coordinator  Referral on 06/22/2020.   Referral source: Raiford Noble.   Referral reason: Reason for consult Please assign to [facility] RNCM and [facility] LCSW, Diagnoses of Diabetes, COPD/ Pneumonia, Other Diagnosis: CAD, s/p CABG, depression, anxiety.  Transition of care assessment completed and will follow up for updates.   Goals Addressed              This Visit's Progress   .  Patient Stated she wants get her life back to normal and stay out of the hospital. (pt-stated)        CARE PLAN ENTRY (see longitudinal plan of care for additional care plan information)  Current Barriers:  Marland Kitchen Knowledge Deficits related to CAD, recent hospitalization, and status post CABG.   Nurse Case Manager Clinical Goal(s):  Marland Kitchen Over the next 60 days, patient will not experience hospital admission. Hospital Admissions in last 6 months = 1 . Over the next 30 days, patient will attend all scheduled medical appointments: surgeon, primary care provider, podiatrist, and cardiologist.   Interventions:  . Inter-disciplinary care team collaboration (see longitudinal plan of  care) . Provided education to patient and/or caregiver about advanced directives . Advised patient to call primary provider if sooner follow up appointment needed. . Provided education to patient re: signs and symptoms to report to provider, how to reach on-call provider after hours.  . Reviewed scheduled/upcoming provider appointments including: podiatrist on 07/05/2020, surgeon on 07/13/2020, primary provider on 07/26/2020, and cardiologist on 08/31/2020.   Patient Self Care Activities:  . Self administers medications as prescribed . Attends all scheduled provider appointments . Calls pharmacy for medication refills . Performs ADL's independently . Calls provider office for new concerns or questions . Patient maintains mobility restrictions as prescribed.   Initial goal documentation        Plan: RNCM will send patient welcome outreach letter, welcome packet, consent, [facility] pamphlet, and magnet.  RNCM will send primary MD barrier/ involvement letter and route assessment.  RNCM will call patient and / or patient's daughter / designated party release [name] [name]) for telephone outreach attempt, within 7 business days, transition of care weekly follow up.  Tamalyn Wadsworth H. Gardiner Barefoot, BSN, CCM [facility] Care Management [employer] [employer] Telephonic CM Phone: [phone] Fax: [phone]

## 2020-07-05 ENCOUNTER — Telehealth: Payer: Self-pay

## 2020-07-05 ENCOUNTER — Telehealth: Payer: Self-pay | Admitting: *Deleted

## 2020-07-05 DIAGNOSIS — E1151 Type 2 diabetes mellitus with diabetic peripheral angiopathy without gangrene: Secondary | ICD-10-CM | POA: Diagnosis not present

## 2020-07-05 DIAGNOSIS — E1140 Type 2 diabetes mellitus with diabetic neuropathy, unspecified: Secondary | ICD-10-CM | POA: Diagnosis not present

## 2020-07-05 NOTE — Telephone Encounter (Signed)
FMLA forms/[place] for DGT/Cindy Daphine Deutscher faxed to [phone],  Beginning leave 06/15/20 through 08/13/20, continuous leave. Forms mailed to pt's home address.

## 2020-07-05 NOTE — Telephone Encounter (Signed)
-----   Message from Antoine Poche, MD sent at 07/05/2020  4:04 PM EDT ----- Yes that's fine  JB ----- Message ----- From: Albertine Patricia, CMA Sent: 07/03/2020   2:12 PM EDT To: Jonelle Sidle, MD, Antoine Poche, MD  This pt was seen in hospital consult by Dr Wyline Mood in June, pt then seen Mardelle Matte who wanted her to f/u in 3 months with Dr Wyline Mood but pt is requesting Dr Diona Browner which we scheduled for September, is this ok ?  Sherriann Szuch

## 2020-07-10 DIAGNOSIS — E1122 Type 2 diabetes mellitus with diabetic chronic kidney disease: Secondary | ICD-10-CM | POA: Diagnosis not present

## 2020-07-10 DIAGNOSIS — I2510 Atherosclerotic heart disease of native coronary artery without angina pectoris: Secondary | ICD-10-CM | POA: Diagnosis not present

## 2020-07-10 DIAGNOSIS — I129 Hypertensive chronic kidney disease with stage 1 through stage 4 chronic kidney disease, or unspecified chronic kidney disease: Secondary | ICD-10-CM | POA: Diagnosis not present

## 2020-07-10 DIAGNOSIS — D62 Acute posthemorrhagic anemia: Secondary | ICD-10-CM | POA: Diagnosis not present

## 2020-07-10 DIAGNOSIS — N1832 Chronic kidney disease, stage 3b: Secondary | ICD-10-CM | POA: Diagnosis not present

## 2020-07-10 DIAGNOSIS — Z48812 Encounter for surgical aftercare following surgery on the circulatory system: Secondary | ICD-10-CM | POA: Diagnosis not present

## 2020-07-12 ENCOUNTER — Other Ambulatory Visit: Payer: Self-pay | Admitting: Thoracic Surgery (Cardiothoracic Vascular Surgery)

## 2020-07-12 ENCOUNTER — Ambulatory Visit: Payer: Self-pay | Admitting: *Deleted

## 2020-07-12 DIAGNOSIS — Z951 Presence of aortocoronary bypass graft: Secondary | ICD-10-CM

## 2020-07-12 NOTE — Progress Notes (Unsigned)
cxr 

## 2020-07-13 ENCOUNTER — Other Ambulatory Visit: Payer: Self-pay

## 2020-07-13 ENCOUNTER — Ambulatory Visit
Admission: RE | Admit: 2020-07-13 | Discharge: 2020-07-13 | Disposition: A | Discharge status: Home / Self Care | Payer: Medicare Other | Source: Ambulatory Visit | Attending: Thoracic Surgery (Cardiothoracic Vascular Surgery) | Admitting: Thoracic Surgery (Cardiothoracic Vascular Surgery)

## 2020-07-13 ENCOUNTER — Ambulatory Visit (INDEPENDENT_AMBULATORY_CARE_PROVIDER_SITE_OTHER): Payer: Self-pay | Admitting: Thoracic Surgery (Cardiothoracic Vascular Surgery)

## 2020-07-13 ENCOUNTER — Ambulatory Visit: Payer: Self-pay | Admitting: *Deleted

## 2020-07-13 ENCOUNTER — Encounter: Payer: Self-pay | Admitting: Thoracic Surgery (Cardiothoracic Vascular Surgery)

## 2020-07-13 VITALS — BP 109/70 | HR 78 | Temp 97.9°F | Resp 20 | Ht 59.0 in | Wt 154.0 lb

## 2020-07-13 DIAGNOSIS — I2510 Atherosclerotic heart disease of native coronary artery without angina pectoris: Secondary | ICD-10-CM

## 2020-07-13 DIAGNOSIS — I700 Atherosclerosis of aorta: Secondary | ICD-10-CM | POA: Diagnosis not present

## 2020-07-13 DIAGNOSIS — Z951 Presence of aortocoronary bypass graft: Secondary | ICD-10-CM | POA: Diagnosis not present

## 2020-07-13 NOTE — Progress Notes (Signed)
      [address]       [place] [address]             [phone]        KAROLYNE TIMMONS [facility] Medical Record #[id number] Date of Birth: [birth year]  Referring: Antoine Poche, MD Primary Care: Freddy Finner, NP Primary Cardiologist:Branch, Christiane Ha, MD  Reason for visit:   follow-up  History of Present Illness:     Ms. [name] [name] comes in for 1 month follow-up appointment.  She has been discharged from the rehab center and is now at home.  She is ambulating without assistance, but  remains quite debilitated.  This however is not far from her preoperative baseline.  Physical Exam: BP 109/70   Pulse 78   Temp 97.9 F (36.6 C) (Skin)   Resp 20   Ht 4\' 11"  (1.499 m)   Wt 154 lb (69.9 kg)   SpO2 96% Comment: RA  BMI 31.10 kg/m   Alert NAD Incision clean.  Sternum stable Abdomen soft, ND No peripheral edema   Diagnostic Studies & Laboratory data: CXR: Clear     Assessment / Plan:   79 year old female status post CABG currently doing well. Cleared for cardiac rehab Follow-up as needed   70 07/13/2020 5:09 PM

## 2020-07-16 ENCOUNTER — Other Ambulatory Visit: Payer: Self-pay | Admitting: *Deleted

## 2020-07-16 ENCOUNTER — Other Ambulatory Visit: Payer: Self-pay

## 2020-07-16 DIAGNOSIS — I129 Hypertensive chronic kidney disease with stage 1 through stage 4 chronic kidney disease, or unspecified chronic kidney disease: Secondary | ICD-10-CM | POA: Diagnosis not present

## 2020-07-16 DIAGNOSIS — E1122 Type 2 diabetes mellitus with diabetic chronic kidney disease: Secondary | ICD-10-CM | POA: Diagnosis not present

## 2020-07-16 DIAGNOSIS — I2510 Atherosclerotic heart disease of native coronary artery without angina pectoris: Secondary | ICD-10-CM | POA: Diagnosis not present

## 2020-07-16 DIAGNOSIS — N1832 Chronic kidney disease, stage 3b: Secondary | ICD-10-CM | POA: Diagnosis not present

## 2020-07-16 DIAGNOSIS — D62 Acute posthemorrhagic anemia: Secondary | ICD-10-CM | POA: Diagnosis not present

## 2020-07-16 DIAGNOSIS — Z48812 Encounter for surgical aftercare following surgery on the circulatory system: Secondary | ICD-10-CM | POA: Diagnosis not present

## 2020-07-16 NOTE — Patient Outreach (Signed)
[facility]) Care Management  07/16/2020  [name] [name] [birth year] [id number]  Subjective:  Telephone call to patient's home number, no answer, left HIPAA compliant voicemail message for [name] [name] and [name] [name] ( patient's daughter / designated party release), and requested call back.   Objective: Per KPN (Knowledge Performance Now, point of care tool) and chart review, patient hospitalized 05/17/2020 - 06/01/2020 for CAD, status post CABG times 3 on 05/22/2020.   Patient at skilled nursing facility [facility]) 06/01/2020 - 06/22/2020 for rehab.   Patient also has a history of hypertension, diabetes, COPD, hyperlipidemia, anemia, Stage 3b Chronic Kidney Disease(status post Left  nephrectomy in 1970's complicated by abdominal wall hernia), and major depressive disorder.     Assessment: Received Medicare [facility] Care Management Post Acute Care Coordinator  Referral on 06/22/2020.   Referral source: Raiford Noble.   Referral reason: Reason for consult Please assign to [facility] RNCM and [facility] LCSW, Diagnoses of Diabetes, COPD/ Pneumonia, Other Diagnosis: CAD, s/p CABG, depression, anxiety.  Transition of care assessment completed and will follow for updates, pending patient / patient's daughter / designated party release contact.     Plan: RNCM has sent patient welcome outreach letter, welcome packet, consent, [facility] pamphlet, and magnet. RNCM has sent primary MD barrier/ involvement letter and route assessment.  RNCM will call patient and / or patient's daughter / designated party release [name] [name]) for 2nd telephone outreach attempt, within 7 business days, transition of care weekly follow up.    [name] [name] H. Gardiner Barefoot, BSN, CCM [facility] Care Management [facility] Telephonic CM Phone: [phone] Fax: [phone]

## 2020-07-16 NOTE — Progress Notes (Signed)
Outpatient Cardiac rehab referral place at [facility].

## 2020-07-17 ENCOUNTER — Ambulatory Visit: Payer: Medicare Other | Admitting: Family Medicine

## 2020-07-24 ENCOUNTER — Ambulatory Visit: Payer: Self-pay | Admitting: *Deleted

## 2020-07-24 ENCOUNTER — Other Ambulatory Visit: Payer: Self-pay | Admitting: *Deleted

## 2020-07-24 DIAGNOSIS — N1832 Chronic kidney disease, stage 3b: Secondary | ICD-10-CM | POA: Diagnosis not present

## 2020-07-24 DIAGNOSIS — D62 Acute posthemorrhagic anemia: Secondary | ICD-10-CM | POA: Diagnosis not present

## 2020-07-24 DIAGNOSIS — Z48812 Encounter for surgical aftercare following surgery on the circulatory system: Secondary | ICD-10-CM | POA: Diagnosis not present

## 2020-07-24 DIAGNOSIS — I2510 Atherosclerotic heart disease of native coronary artery without angina pectoris: Secondary | ICD-10-CM | POA: Diagnosis not present

## 2020-07-24 DIAGNOSIS — E1122 Type 2 diabetes mellitus with diabetic chronic kidney disease: Secondary | ICD-10-CM | POA: Diagnosis not present

## 2020-07-24 DIAGNOSIS — I129 Hypertensive chronic kidney disease with stage 1 through stage 4 chronic kidney disease, or unspecified chronic kidney disease: Secondary | ICD-10-CM | POA: Diagnosis not present

## 2020-07-24 NOTE — Patient Outreach (Signed)
[facility]) Care Management  [facility] Care Manager  07/24/2020   [name] [name] 07/04/1941 [id number]  Subjective: Received voicemail message from patient's daughter / designated party release [name]), states she is returning call, and requested call back. Telephone call to patient's home number, spoke with patient and patient's daughter / designated party release [name]).  Daughter stated patient's name, date of birth, and address.  Daughter placed phone on speaker, remainder of call, completed follow up with daughter, and patient.  Daughter and patient states, patient is doing well, listening to her body, resting as needed, managing pain without medications, with modified activity, and rest as needed.  Patient states she continues to receiving home health services and services going well.  States she is having some periodic left hand numbness from old IV site that has been mentioned to home health services and she is planning to discuss with primary care provider during next appointment on 07/26/2020.  States she has been doing home exercises to left hand that has assisted with numbness and periodic stiffness.   States she also had a follow up appointment with podiatrist on 07/05/2020 and with surgeon on 07/13/2020, both appointments went well, surgery site healing well,  continues to manage chest soreness as needed.   States she ate some sweet last night and blood sugar was 242 this morning, continues to take medications as prescribed.  States her blood sugar on 07/23/2020 was 220.    Patient states she is aware of signs/ symptoms to report, how to reach provider if needed after hours, when to go to ED, and / or call 911.    States she has a follow up appointment with cardiologist on 08/31/2020.   States she has received [facility] Care Management information sent by this [facility] and Social Worker, in the process of reviewing with daughter, no questions at this time, and will follow up with  RNCM if questions arise in the future.   Patient states he does not have any Secretary/administrator, transportation, [employer], or pharmacy needs at this time. States she is very appreciative of the follow up and is in agreement to continue to receive [facility] Care Management information / services.     Objective: Per KPN (Knowledge Performance Now, point of care tool) and chart review, patient hospitalized 05/17/2020 - 06/01/2020 for CAD, status post CABG times 3 on 05/22/2020.   Patient at skilled nursing facility [facility]) 06/01/2020 - 06/22/2020 for rehab.   Patient also has a history of hypertension, diabetes, COPD, hyperlipidemia, anemia, Stage 3b Chronic Kidney Disease(status post Left  nephrectomy in 1970's complicated by abdominal wall hernia), and major depressive disorder.    Encounter Medications:  Outpatient Encounter Medications as of 07/24/2020  Medication Sig Note  . aspirin EC 325 MG EC tablet Take 1 tablet (325 mg total) by mouth daily.   Marland Kitchen atorvastatin (LIPITOR) 40 MG tablet Take 1 tablet (40 mg total) by mouth daily.   . Biotin w/ Vitamins C & E (HAIR SKIN & NAILS GUMMIES PO) Take 2 each by mouth every morning.   . fish oil-omega-3 fatty acids 1000 MG capsule Take 1 g by mouth 2 (two) times daily.   Marland Kitchen linagliptin (TRADJENTA) 5 MG TABS tablet Take 1 tablet (5 mg total) by mouth daily.   . metoprolol tartrate (LOPRESSOR) 25 MG tablet Take 0.5 tablets (12.5 mg total) by mouth 2 (two) times daily.   . Multiple Vitamin (MULITIVITAMIN WITH MINERALS) TABS Take 1 tablet  by mouth daily.   . Multiple Vitamins-Minerals (AIRBORNE GUMMIES PO) Take 1 each by mouth every morning.   . traMADol (ULTRAM) 50 MG tablet Take 1 tablet (50 mg total) by mouth every 6 (six) hours as needed for moderate pain. (Patient not taking: Reported on 07/24/2020) 07/04/2020: States has not needed.    No facility-administered encounter medications on file as of 07/24/2020.    Functional Status:  In your present  state of health, do you have any difficulty performing the following activities: 07/04/2020 05/18/2020  Hearing? N N  Vision? N N  Difficulty concentrating or making decisions? N N  Walking or climbing stairs? N Y  Comment Per patient follows mobility restrictions. -  Dressing or bathing? N N  Comment Per patient follows mobility restrictions. -  Doing errands, shopping? Y N  Comment Per patient follows mobility restrictions. -  Preparing Food and eating ? N -  Using the Toilet? N -  In the past six months, have you accidently leaked urine? N -  Do you have problems with loss of bowel control? N -  Managing your Medications? N -  Managing your Finances? N -  Housekeeping or managing your Housekeeping? Y -  Some recent data might be hidden    Fall/Depression Screening: Fall Risk  07/04/2020 03/07/2020 07/14/2019  Falls in the past year? 0 0 (No Data)  Comment - - Emmi Telephone Survey: data to providers prior to load  Number falls in past yr: 0 0 (No Data)  Comment - - Emmi Telephone Survey Actual Response =   Injury with Fall? 0 - -  Risk for fall due to : Other (Comment) - -  Risk for fall due to: Comment Risk due to recent surgery per patient - -  Follow up Falls evaluation completed;Education provided;Falls prevention discussed Falls evaluation completed -   PHQ 2/9 Scores 07/04/2020 06/27/2020 03/07/2020  PHQ - 2 Score 0 1 1    Assessment: Received Medicare [facility] Care Management Post Acute Care Coordinator  Referral on 06/22/2020.   Referral source: Raiford Noble.   Referral reason: Reason for consult Please assign to [facility] RNCM and THN LCSW, Diagnoses of Diabetes, COPD/ Pneumonia, Other Diagnosis: CAD, s/p CABG, depression, anxiety.  Transition of care assessment completed and will follow up for updates.   Goals Addressed              This Visit's Progress   .  Patient Stated she wants get her life back to normal and stay out of the hospital. (pt-stated)   On track     CARE PLAN  ENTRY (see longitudinal plan of care for additional care plan information)  Current Barriers:  Marland Kitchen Knowledge Deficits related to CAD, recent hospitalization, and status post CABG.   Nurse Case Manager Clinical Goal(s):  Marland Kitchen Over the next 60 days, patient will not experience hospital admission. Hospital Admissions in last 6 months = 1 . Over the next 30 days, patient will attend all scheduled medical appointments: surgeon, primary care provider, podiatrist, and cardiologist.   Interventions:  . Inter-disciplinary care team collaboration (see longitudinal plan of care) . Provided education to patient and/or caregiver about advanced directives . Advised patient to call primary provider if sooner follow up appointment needed. . Provided education to patient re: signs and symptoms to report to provider, how to reach on-call provider after hours.  . Discussed plans with patient for ongoing care management follow up and provided patient with direct contact information for care  management team . Reviewed scheduled/upcoming provider appointments including: podiatrist on 07/05/2020, surgeon on 07/13/2020, primary provider on 07/26/2020, and cardiologist on 08/31/2020.   Patient Self Care Activities:  . Self administers medications as prescribed . Attends all scheduled provider appointments . Calls pharmacy for medication refills . Performs ADL's independently . Performs IADL's independently . Calls provider office for new concerns or questions . Patient maintains mobility restrictions as prescribed.   Updated 07/24/2020         Plan:  RNCM will call patient and / or patient's daughter / designated party release [name]) for telephone outreach attempt, within 7 business days, transition of care weekly follow up.     Adalay Azucena H. Gardiner Barefoot, BSN, CCM [facility] Care Management [facility] Telephonic CM Phone: [phone] Fax: [phone]

## 2020-07-26 ENCOUNTER — Encounter: Payer: Self-pay | Admitting: Family Medicine

## 2020-07-26 ENCOUNTER — Ambulatory Visit (INDEPENDENT_AMBULATORY_CARE_PROVIDER_SITE_OTHER): Payer: Medicare Other | Admitting: Family Medicine

## 2020-07-26 ENCOUNTER — Other Ambulatory Visit: Payer: Self-pay

## 2020-07-26 VITALS — BP 145/83 | HR 87 | Temp 97.5°F | Resp 18 | Ht 59.0 in | Wt 157.0 lb

## 2020-07-26 DIAGNOSIS — E1121 Type 2 diabetes mellitus with diabetic nephropathy: Secondary | ICD-10-CM | POA: Diagnosis not present

## 2020-07-26 DIAGNOSIS — N1832 Chronic kidney disease, stage 3b: Secondary | ICD-10-CM

## 2020-07-26 DIAGNOSIS — I119 Hypertensive heart disease without heart failure: Secondary | ICD-10-CM

## 2020-07-26 NOTE — Assessment & Plan Note (Signed)
Followed closely now by cardiology secondary to recent surgery.  Encouraged to continue taking her medication as directed.  Avoid salt in her diet.  And exercise to do therapy as tolerated.

## 2020-07-26 NOTE — Patient Instructions (Addendum)
I appreciate the opportunity to provide you with care for your health and wellness. Today we discussed: Establish care  Follow up: 4 months   No labs or referrals today  Nice to meet you today  Please continue to practice social distancing to keep you, your family, and our community safe.  If you must go out, please wear a mask and practice good handwashing.  It was a pleasure to see you and I look forward to continuing to work together on your health and well-being. Please do not hesitate to call the office if you need care or have questions about your care.  Have a wonderful day and week. With Gratitude, Tereasa Coop, DNP, AGNP-BC

## 2020-07-26 NOTE — Progress Notes (Signed)
Subjective:  Patient ID: [name] [name], female    DOB: [birth year]  Age: 79 y.o. MRN: [id number]  CC:  Chief Complaint  Patient presents with  . New Patient (Initial Visit)    new patient has had open heart surgery right hand fingers have been numb and she does still have heaviness in her chest she follows up with [facility] in Sept       HPI  HPI [name] [name] is a 79 year old female patient presents today to establish care.  She has a history that includes but is not limited to hypertension, heart disease recent CABG procedure back in June, diabetes, renal changes.  She reports taking all medications without any issues or concerns.  She is currently still doing therapy due to the open heart surgery.  Sites are healing nicely no infection.  She reports that she sleeps okay.  She reports that she has no trouble with chewing or swallowing.  Has not seen a dentist in years.  She denies having any changes in bowel or bladder habits.  She does get constipated at times but takes her medication to help with this.  She denies having any blood in urine or stool.  She denies having any memory issues or confusion.  She denies having any falls or injuries.  She denies having any hearing issues.  She denies having any vision issues.  She declines all preventative medicine including colonoscopy, mammogram, vaccinations.  She reports that her blood sugars have been running a little bit elevated recently.  But she is trying to get this back on track.  Last A1c was 7.4 in June.  Cholesterol levels were controlled outside of triglycerides being elevated at 286 in June.  She denies having any active chest pain outside when she moves at her surgical site.  Today patient denies signs and symptoms of COVID 19 infection including fever, chills, cough, shortness of breath, and headache. Past Medical, Surgical, Social History, Allergies, and Medications have been Reviewed.   Past Medical History:  Diagnosis  Date  . Abnormal cardiovascular stress test 05/18/2020  . Anemia, unspecified   . Anxiety disorder, unspecified   . Atherosclerotic heart disease of native coronary artery without angina pectoris   . Chest pain 06/11/2016  . Chronic obstructive pulmonary disease, unspecified (HCC)   . Diabetes mellitus   . Diabetes mellitus (HCC) 06/13/2009   Qualifier: Diagnosis of  By: Bascom Levels, RMA, Sherri    . Gastro-esophageal reflux disease with esophagitis   . GERD 06/13/2009   Qualifier: Diagnosis of  By: Bascom Levels, RMA, Sherri    . Heart disease   . Hypertension   . Hypertensive chronic kidney disease with stage 1 through stage 4 chronic kidney disease, or unspecified chronic kidney disease   . Hypertensive heart disease without heart failure   . Incarcerated ventral hernia   . Major depressive disorder, single episode, unspecified   . Pneumonia   . Renal disorder   . Renal insufficiency   . Shortness of breath   . Type 2 diabetes mellitus with diabetic nephropathy (HCC)   . Type 2 diabetes mellitus with hyperglycemia (HCC)   . Unspecified abdominal hernia without obstruction or gangrene   . Unspecified osteoarthritis, unspecified site     Current Meds  Medication Sig  . aspirin EC 325 MG EC tablet Take 1 tablet (325 mg total) by mouth daily.  Marland Kitchen atorvastatin (LIPITOR) 40 MG tablet Take 1 tablet (40 mg total) by mouth daily.  . Biotin  w/ Vitamins C & E (HAIR SKIN & NAILS GUMMIES PO) Take 2 each by mouth every morning.  . Cetirizine HCl 10 MG CAPS Take by mouth.  . fish oil-omega-3 fatty acids 1000 MG capsule Take 1 g by mouth 2 (two) times daily.  Marland Kitchen linagliptin (TRADJENTA) 5 MG TABS tablet Take 1 tablet (5 mg total) by mouth daily.  . metoprolol tartrate (LOPRESSOR) 25 MG tablet Take 0.5 tablets (12.5 mg total) by mouth 2 (two) times daily.  . Multiple Vitamin (MULITIVITAMIN WITH MINERALS) TABS Take 1 tablet by mouth daily.  . Multiple Vitamins-Minerals (AIRBORNE GUMMIES PO) Take 1 each by  mouth every morning.    ROS:  Review of Systems  Constitutional: Negative.   HENT: Negative.   Eyes: Negative.   Respiratory: Negative.   Cardiovascular: Negative.   Gastrointestinal: Negative.   Genitourinary: Negative.   Musculoskeletal: Negative.   Skin: Negative.   Neurological: Negative.   Endo/Heme/Allergies: Negative.   Psychiatric/Behavioral: Negative.   All other systems reviewed and are negative.    Objective:   Today's Vitals: BP (!) 145/83 (BP Location: Left Arm, Patient Position: Sitting, Cuff Size: Normal)   Pulse 87   Temp (!) 97.5 F (36.4 C) (Oral)   Resp 18   Ht 4\' 11"  (1.499 m)   Wt 157 lb (71.2 kg)   SpO2 92%   BMI 31.71 kg/m  Vitals with BMI 07/26/2020 07/13/2020 07/04/2020  Height 4\' 11"  4\' 11"  4\' 11"   Weight 157 lbs 154 lbs 152 lbs  BMI 31.69 31.09 30.68  Systolic 145 109 -  Diastolic 83 70 -  Pulse 87 78 -     Physical Exam Vitals and nursing note reviewed.  Constitutional:      Appearance: Normal appearance. She is well-developed and well-groomed. She is obese.  HENT:     Head: Normocephalic and atraumatic.     Right Ear: External ear normal.     Left Ear: External ear normal.     Mouth/Throat:     Comments: Mask in place Eyes:     General:        Right eye: No discharge.        Left eye: No discharge.     Conjunctiva/sclera: Conjunctivae normal.  Cardiovascular:     Rate and Rhythm: Normal rate and regular rhythm.     Pulses: Normal pulses.     Heart sounds: Normal heart sounds.  Pulmonary:     Effort: Pulmonary effort is normal.     Breath sounds: Normal breath sounds.  Musculoskeletal:        General: Normal range of motion.     Cervical back: Normal range of motion and neck supple.  Skin:    General: Skin is warm.     Comments: CABG surgical incision noted-well-healed  Vein harvesting incisions noted healing  Neurological:     General: No focal deficit present.     Mental Status: She is alert and oriented to person,  place, and time.  Psychiatric:        Attention and Perception: Attention normal.        Mood and Affect: Mood normal.        Speech: Speech normal.        Behavior: Behavior normal. Behavior is cooperative.        Thought Content: Thought content normal.        Cognition and Memory: Cognition normal.        Judgment: Judgment normal.  Assessment   1. Type 2 diabetes mellitus with diabetic nephropathy, unspecified whether long term insulin use (HCC)   2. Hypertensive heart disease without heart failure   3. Chronic kidney disease, stage 3b     Tests ordered No orders of the defined types were placed in this encounter.    Plan: Please see assessment and plan per problem list above.   No orders of the defined types were placed in this encounter.   Patient to follow-up in 4 months  Freddy Finner, NP

## 2020-07-26 NOTE — Assessment & Plan Note (Signed)
Encouraged maintenance of her blood pressure and diabetes.

## 2020-07-31 ENCOUNTER — Ambulatory Visit: Payer: Self-pay | Admitting: *Deleted

## 2020-08-02 DIAGNOSIS — I129 Hypertensive chronic kidney disease with stage 1 through stage 4 chronic kidney disease, or unspecified chronic kidney disease: Secondary | ICD-10-CM | POA: Diagnosis not present

## 2020-08-02 DIAGNOSIS — Z905 Acquired absence of kidney: Secondary | ICD-10-CM | POA: Diagnosis not present

## 2020-08-02 DIAGNOSIS — E1122 Type 2 diabetes mellitus with diabetic chronic kidney disease: Secondary | ICD-10-CM | POA: Diagnosis not present

## 2020-08-02 DIAGNOSIS — K2100 Gastro-esophageal reflux disease with esophagitis, without bleeding: Secondary | ICD-10-CM | POA: Diagnosis not present

## 2020-08-02 DIAGNOSIS — R41841 Cognitive communication deficit: Secondary | ICD-10-CM | POA: Diagnosis not present

## 2020-08-02 DIAGNOSIS — Z951 Presence of aortocoronary bypass graft: Secondary | ICD-10-CM | POA: Diagnosis not present

## 2020-08-02 DIAGNOSIS — F419 Anxiety disorder, unspecified: Secondary | ICD-10-CM | POA: Diagnosis not present

## 2020-08-02 DIAGNOSIS — N1832 Chronic kidney disease, stage 3b: Secondary | ICD-10-CM | POA: Diagnosis not present

## 2020-08-02 DIAGNOSIS — E782 Mixed hyperlipidemia: Secondary | ICD-10-CM | POA: Diagnosis not present

## 2020-08-02 DIAGNOSIS — F329 Major depressive disorder, single episode, unspecified: Secondary | ICD-10-CM | POA: Diagnosis not present

## 2020-08-02 DIAGNOSIS — E1121 Type 2 diabetes mellitus with diabetic nephropathy: Secondary | ICD-10-CM | POA: Diagnosis not present

## 2020-08-02 DIAGNOSIS — D62 Acute posthemorrhagic anemia: Secondary | ICD-10-CM | POA: Diagnosis not present

## 2020-08-02 DIAGNOSIS — Z7984 Long term (current) use of oral hypoglycemic drugs: Secondary | ICD-10-CM | POA: Diagnosis not present

## 2020-08-02 DIAGNOSIS — Z48812 Encounter for surgical aftercare following surgery on the circulatory system: Secondary | ICD-10-CM | POA: Diagnosis not present

## 2020-08-02 DIAGNOSIS — J449 Chronic obstructive pulmonary disease, unspecified: Secondary | ICD-10-CM | POA: Diagnosis not present

## 2020-08-02 DIAGNOSIS — E1165 Type 2 diabetes mellitus with hyperglycemia: Secondary | ICD-10-CM | POA: Diagnosis not present

## 2020-08-02 DIAGNOSIS — M1990 Unspecified osteoarthritis, unspecified site: Secondary | ICD-10-CM | POA: Diagnosis not present

## 2020-08-02 DIAGNOSIS — K469 Unspecified abdominal hernia without obstruction or gangrene: Secondary | ICD-10-CM | POA: Diagnosis not present

## 2020-08-02 DIAGNOSIS — I2510 Atherosclerotic heart disease of native coronary artery without angina pectoris: Secondary | ICD-10-CM | POA: Diagnosis not present

## 2020-08-02 DIAGNOSIS — E669 Obesity, unspecified: Secondary | ICD-10-CM | POA: Diagnosis not present

## 2020-08-06 ENCOUNTER — Other Ambulatory Visit: Payer: Self-pay | Admitting: *Deleted

## 2020-08-06 NOTE — Patient Outreach (Signed)
[facility]) Care Management  [facility] Care Manager  08/06/2020   [name] [name] [birth year] [id number]  Subjective:  Telephone call to patient's home number, spoke with patient and patient's daughter / designated party release [name] [name]). Daughter stated patient's name, date of birth, and address. Daughter placed phone on speaker, remainder of call, completed follow up with daughter, and patient.  Daughter and patient states, patient is doing good, attended follow up appointment with new primary provider on 07/26/2020, and appointment went well.   States patient's left hand issue was discussed with provider, advised to continue treatment with medications as needed,  continue exercise, will continue to follow up for changes with provider, and continue to monitor.    States left leg more swollen than right , per daughter this has not changed since hospitalization, they are aware of signs/ symptoms to report, how to reach provider if needed after hours, when to go to ED, and / or call 911.    Patient has a follow up appointment scheduled with cardiologist on 08/31/2020 and will follow up sooner if needed.   Patient advised RNCM of daily weight recordings for the last week and today's weight was 154.   Patient requested the following EMMI educational handout: Diabetes and Diet, to be sent, will review, will notify this RNCM if any questions.  Patient states she does not have any transportation, [employer], or pharmacy needs at this time.   Patient and daughter states they are very appreciative of the follow up and is in agreement to continue to receive [facility] Care Management information / services.     Objective: Per KPN (Knowledge Performance Now, point of care tool) and chart review,patient hospitalized 05/17/2020 - 06/01/2020 for CAD, status post CABG times 3 on 05/22/2020. Patient at skilled nursing facility [facility]) 06/01/2020 - 06/22/2020 for rehab. Patient also has a  history of hypertension, diabetes, COPD, hyperlipidemia, anemia, Stage 3bChronicKidneyDisease(status postLeftnephrectomy in 1970's complicated by abdominal wall hernia), and major depressive disorder.   Encounter Medications:  Outpatient Encounter Medications as of 08/06/2020  Medication Sig  . aspirin EC 325 MG EC tablet Take 1 tablet (325 mg total) by mouth daily.  Marland Kitchen atorvastatin (LIPITOR) 40 MG tablet Take 1 tablet (40 mg total) by mouth daily.  . Biotin w/ Vitamins C & E (HAIR SKIN & NAILS GUMMIES PO) Take 2 each by mouth every morning.  . Cetirizine HCl 10 MG CAPS Take by mouth.  . fish oil-omega-3 fatty acids 1000 MG capsule Take 1 g by mouth 2 (two) times daily.  Marland Kitchen linagliptin (TRADJENTA) 5 MG TABS tablet Take 1 tablet (5 mg total) by mouth daily.  . metoprolol tartrate (LOPRESSOR) 25 MG tablet Take 0.5 tablets (12.5 mg total) by mouth 2 (two) times daily.  . Multiple Vitamin (MULITIVITAMIN WITH MINERALS) TABS Take 1 tablet by mouth daily.  . Multiple Vitamins-Minerals (AIRBORNE GUMMIES PO) Take 1 each by mouth every morning.   No facility-administered encounter medications on file as of 08/06/2020.    Functional Status:  In your present state of health, do you have any difficulty performing the following activities: 07/04/2020 05/18/2020  Hearing? N N  Vision? N N  Difficulty concentrating or making decisions? N N  Walking or climbing stairs? N Y  Comment Per patient follows mobility restrictions. -  Dressing or bathing? N N  Comment Per patient follows mobility restrictions. -  Doing errands, shopping? Y N  Comment Per patient follows mobility restrictions. -  Preparing  Food and eating ? N -  Using the Toilet? N -  In the past six months, have you accidently leaked urine? N -  Do you have problems with loss of bowel control? N -  Managing your Medications? N -  Managing your Finances? N -  Housekeeping or managing your Housekeeping? Y -  Some recent data might be  hidden    Fall/Depression Screening: Fall Risk  07/26/2020 07/04/2020 03/07/2020  Falls in the past year? 0 0 0  Comment - - -  Number falls in past yr: 0 0 0  Comment - - -  Injury with Fall? 0 0 -  Risk for fall due to : No Fall Risks Other (Comment) -  Risk for fall due to: Comment - Risk due to recent surgery per patient -  Follow up Falls evaluation completed Falls evaluation completed;Education provided;Falls prevention discussed Falls evaluation completed   PHQ 2/9 Scores 07/26/2020 07/04/2020 06/27/2020 03/07/2020  PHQ - 2 Score 0 0 1 1  PHQ- 9 Score 0 - - -    Assessment: Received Medicare [facility] Care Management Post Acute Care Coordinator Referral on 06/22/2020. Referral source: Raiford Noble. Referral reason: Reason for consult Please assign to [facility] RNCM and THN LCSW,Diagnoses of Diabetes,COPD/ Pneumonia,Other Diagnosis: CAD, s/p CABG, depression, anxiety. Transition of care assessment completed and will follow up for updates.   Goals    .  Patient Stated she wants get her life back to normal and stay out of the hospital. (pt-stated)      CARE PLAN ENTRY (see longitudinal plan of care for additional care plan information)  Current Barriers:  Marland Kitchen Knowledge Deficits related to CAD, recent hospitalization, and status post CABG.   Nurse Case Manager Clinical Goal(s):  Marland Kitchen Over the next 60 days, patient will not experience hospital admission. Hospital Admissions in last 6 months = 1 . Over the next 30 days, patient will attend all scheduled medical appointments: surgeon, primary care provider, podiatrist, and cardiologist.   Interventions:  . Inter-disciplinary care team collaboration (see longitudinal plan of care) . Provided education to patient and/or caregiver about advanced directives . Advised patient to call primary provider if sooner follow up appointment needed. . Provided education to patient re: signs and symptoms to report to provider, how to reach on-call provider after  hours.  . Discussed plans with patient for ongoing care management follow up and provided patient with direct contact information for care management team . Reviewed scheduled/upcoming provider appointments including:  . Advised patient, providing education and rationale, to weigh daily and record, calling cardiologist for weight gain of 3lbs overnight or 5 pounds in a week. podiatrist on 07/05/2020, surgeon on 07/13/2020, primary provider on 07/26/2020, and cardiologist on 08/31/2020.   Patient Self Care Activities:  . Patient verbalizes understanding of plan to report signs and symptoms to MD as needed. . Self administers medications as prescribed . Attends all scheduled provider appointments . Calls pharmacy for medication refills . Performs ADL's independently . Performs IADL's independently . Calls provider office for new concerns or questions . Patient maintains mobility restrictions as prescribed.   Updated 08/06/2020          Plan: RNCM will send patient the following EMMI educational handout: Diabetes and Diet, per patient's request.  RNCM will call patientand / or patient's daughter / designated party release Pharmacist, hospital telephone outreach attempt, within 7 business days, transition of care weekly follow up.     Amiria Orrison H. Excell Seltzer Charity fundraiser, BSN, CCM  [facility] Care Management [facility] Telephonic CM Phone: [phone] Fax: [phone]

## 2020-08-07 DIAGNOSIS — Z48812 Encounter for surgical aftercare following surgery on the circulatory system: Secondary | ICD-10-CM | POA: Diagnosis not present

## 2020-08-07 DIAGNOSIS — D62 Acute posthemorrhagic anemia: Secondary | ICD-10-CM | POA: Diagnosis not present

## 2020-08-07 DIAGNOSIS — I129 Hypertensive chronic kidney disease with stage 1 through stage 4 chronic kidney disease, or unspecified chronic kidney disease: Secondary | ICD-10-CM | POA: Diagnosis not present

## 2020-08-07 DIAGNOSIS — N1832 Chronic kidney disease, stage 3b: Secondary | ICD-10-CM | POA: Diagnosis not present

## 2020-08-07 DIAGNOSIS — I2510 Atherosclerotic heart disease of native coronary artery without angina pectoris: Secondary | ICD-10-CM | POA: Diagnosis not present

## 2020-08-07 DIAGNOSIS — E1122 Type 2 diabetes mellitus with diabetic chronic kidney disease: Secondary | ICD-10-CM | POA: Diagnosis not present

## 2020-08-13 ENCOUNTER — Other Ambulatory Visit: Payer: Self-pay | Admitting: *Deleted

## 2020-08-13 NOTE — Patient Outreach (Signed)
[facility]) Care Management  [facility] Care Manager  08/13/2020   TRINE FREAD 09-23-1941 [id number]  Subjective:   Telephone call to patient's home number, spoke with patient and patient's daughter / designated party release [name]). Daughter stated patient's name, date of birth, and address. Daughter placed phone on speaker, remainder of call, completedfollow upwith daughter, and patient. Daughter and patient states, patient is doing well, no MD appointments since last patient outreach, no new issues, swelling in left leg remains the same, continues to be periodic, continues to elevate as needed, and continues to reduce activities as needed. Patient states she is aware of signs/ symptoms to report, how to reach provider if needed after hours, when to go to ED, and / or call 911.  Daughter and patient aware, patient has completed 30 day transition of care follow up.   Patient states she does not have any education material, transition of care,  transportation, [employer], or pharmacy needs at this time.   States she has not received the [facility] educational material sent out after the last patient outreach.  RNCM will discuss  EMMI education materials  at next outreach and will assess if materials need to be re-sent.    States she is very appreciative of the follow up and is in agreement to continue to receive [facility] Care Management information / services.      Objective: Per KPN (Knowledge Performance Now, point of care tool) and chart review,patient hospitalized 05/17/2020 - 06/01/2020 for CAD, status post CABG times 3 on 05/22/2020. Patient at skilled nursing facility [facility]) 06/01/2020 - 06/22/2020 for rehab. Patient also has a history of hypertension, diabetes, COPD, hyperlipidemia, anemia, Stage 3bChronicKidneyDisease(status postLeftnephrectomy in 1970's complicated by abdominal wall hernia), and major depressive disorder.    Encounter Medications:   Outpatient Encounter Medications as of 08/13/2020  Medication Sig  . aspirin EC 325 MG EC tablet Take 1 tablet (325 mg total) by mouth daily.  Marland Kitchen atorvastatin (LIPITOR) 40 MG tablet Take 1 tablet (40 mg total) by mouth daily.  . Biotin w/ Vitamins C & E (HAIR SKIN & NAILS GUMMIES PO) Take 2 each by mouth every morning.  . Cetirizine HCl 10 MG CAPS Take by mouth.  . fish oil-omega-3 fatty acids 1000 MG capsule Take 1 g by mouth 2 (two) times daily.  Marland Kitchen linagliptin (TRADJENTA) 5 MG TABS tablet Take 1 tablet (5 mg total) by mouth daily.  . metoprolol tartrate (LOPRESSOR) 25 MG tablet Take 0.5 tablets (12.5 mg total) by mouth 2 (two) times daily.  . Multiple Vitamin (MULITIVITAMIN WITH MINERALS) TABS Take 1 tablet by mouth daily.  . Multiple Vitamins-Minerals (AIRBORNE GUMMIES PO) Take 1 each by mouth every morning.   No facility-administered encounter medications on file as of 08/13/2020.    Functional Status:  In your present state of health, do you have any difficulty performing the following activities: 07/04/2020 05/18/2020  Hearing? N N  Vision? N N  Difficulty concentrating or making decisions? N N  Walking or climbing stairs? N Y  Comment Per patient follows mobility restrictions. -  Dressing or bathing? N N  Comment Per patient follows mobility restrictions. -  Doing errands, shopping? Y N  Comment Per patient follows mobility restrictions. -  Preparing Food and eating ? N -  Using the Toilet? N -  In the past six months, have you accidently leaked urine? N -  Do you have problems with loss of bowel control? N -  Managing your Medications? N -  Managing your Finances? N -  Housekeeping or managing your Housekeeping? Y -  Some recent data might be hidden    Fall/Depression Screening: Fall Risk  07/26/2020 07/04/2020 03/07/2020  Falls in the past year? 0 0 0  Comment - - -  Number falls in past yr: 0 0 0  Comment - - -  Injury with Fall? 0 0 -  Risk for fall due to : No Fall  Risks Other (Comment) -  Risk for fall due to: Comment - Risk due to recent surgery per patient -  Follow up Falls evaluation completed Falls evaluation completed;Education provided;Falls prevention discussed Falls evaluation completed   PHQ 2/9 Scores 07/26/2020 07/04/2020 06/27/2020 03/07/2020  PHQ - 2 Score 0 0 1 1  PHQ- 9 Score 0 - - -    Assessment: Received Medicare [facility] Care Management Post Acute Care Coordinator Referral on 06/22/2020. Referral source: Raiford Noble. Referral reason: Reason for consult Please assign to [facility] RNCM and [facility] LCSW,Diagnoses of Diabetes,COPD/ Pneumonia,Other Diagnosis: CAD, s/p CABG, depression, anxiety. Transition of care assessment completed and will follow up for other diagnosis care management needs.   Goals    .  Patient Stated she wants get her life back to normal and stay out of the hospital. (pt-stated)      CARE PLAN ENTRY (see longitudinal plan of care for additional care plan information)  Current Barriers:  Marland Kitchen Knowledge Deficits related to CAD, recent hospitalization, and status post CABG.   Nurse Case Manager Clinical Goal(s):  Marland Kitchen Over the next 60 days, patient will not experience hospital admission. Hospital Admissions in last 6 months = 1 . Over the next 30 days, patient will attend all scheduled medical appointments: surgeon, primary care provider, podiatrist, and cardiologist.   Interventions:  . Inter-disciplinary care team collaboration (see longitudinal plan of care) . Provided education to patient and/or caregiver about advanced directives . Advised patient to call primary provider if sooner follow up appointment needed. . Provided education to patient re: signs and symptoms to report to provider, how to reach on-call provider after hours.  . Discussed plans with patient for ongoing care management follow up and provided patient with direct contact information for care management team . Provided patient with Diabetes educational  materials related to Diabetes and Diet . Reviewed scheduled/upcoming provider appointments including:  . Advised patient, providing education and rationale, to weigh daily and record, calling cardiologist for weight gain of 3lbs overnight or 5 pounds in a week. podiatrist on 07/05/2020, surgeon on 07/13/2020, primary provider on 07/26/2020, and cardiologist on 08/31/2020.   Patient Self Care Activities:  . Patient verbalizes understanding of plan to report signs and symptoms to MD as needed. . Self administers medications as prescribed . Attends all scheduled provider appointments . Calls pharmacy for medication refills . Performs ADL's independently . Performs IADL's independently . Calls provider office for new concerns or questions . Patient states she is elevating leg as needed and monitoring swelling . Patient maintains mobility restrictions as prescribed.   Updated 08/13/2020             Plan:  RNCM will call patientand / or patient's daughter / designated party release Pharmacist, hospital telephone outreach attempt, within 30 business days, other diagnosis care management follow up.     Pavle Wiler H. Gardiner Barefoot, BSN, CCM [facility] Care Management [facility] Telephonic CM Phone: [phone] Fax: [phone]

## 2020-08-17 DIAGNOSIS — E1122 Type 2 diabetes mellitus with diabetic chronic kidney disease: Secondary | ICD-10-CM | POA: Diagnosis not present

## 2020-08-17 DIAGNOSIS — I129 Hypertensive chronic kidney disease with stage 1 through stage 4 chronic kidney disease, or unspecified chronic kidney disease: Secondary | ICD-10-CM | POA: Diagnosis not present

## 2020-08-17 DIAGNOSIS — N1832 Chronic kidney disease, stage 3b: Secondary | ICD-10-CM | POA: Diagnosis not present

## 2020-08-17 DIAGNOSIS — I2510 Atherosclerotic heart disease of native coronary artery without angina pectoris: Secondary | ICD-10-CM | POA: Diagnosis not present

## 2020-08-17 DIAGNOSIS — D62 Acute posthemorrhagic anemia: Secondary | ICD-10-CM | POA: Diagnosis not present

## 2020-08-17 DIAGNOSIS — Z48812 Encounter for surgical aftercare following surgery on the circulatory system: Secondary | ICD-10-CM | POA: Diagnosis not present

## 2020-08-21 ENCOUNTER — Other Ambulatory Visit: Payer: Self-pay | Admitting: *Deleted

## 2020-08-21 MED ORDER — LINAGLIPTIN 5 MG PO TABS: 5.0000 mg | ORAL_TABLET | Freq: Every day | ORAL | 1 refills | Status: DC

## 2020-08-28 DIAGNOSIS — Z48812 Encounter for surgical aftercare following surgery on the circulatory system: Secondary | ICD-10-CM | POA: Diagnosis not present

## 2020-08-28 DIAGNOSIS — E1122 Type 2 diabetes mellitus with diabetic chronic kidney disease: Secondary | ICD-10-CM | POA: Diagnosis not present

## 2020-08-28 DIAGNOSIS — I129 Hypertensive chronic kidney disease with stage 1 through stage 4 chronic kidney disease, or unspecified chronic kidney disease: Secondary | ICD-10-CM | POA: Diagnosis not present

## 2020-08-28 DIAGNOSIS — I2510 Atherosclerotic heart disease of native coronary artery without angina pectoris: Secondary | ICD-10-CM | POA: Diagnosis not present

## 2020-08-28 DIAGNOSIS — N1832 Chronic kidney disease, stage 3b: Secondary | ICD-10-CM | POA: Diagnosis not present

## 2020-08-28 DIAGNOSIS — D62 Acute posthemorrhagic anemia: Secondary | ICD-10-CM | POA: Diagnosis not present

## 2020-08-30 ENCOUNTER — Encounter: Payer: Self-pay | Admitting: Cardiology

## 2020-08-30 NOTE — Progress Notes (Signed)
Cardiology Office Note  Date: 08/31/2020   ID: [name] [name], DOB [birth year], MRN [id number]  PCP:  Freddy Finner, NP  Cardiologist:  Nona Dell, MD Electrophysiologist:  None   Chief Complaint  Patient presents with  . Cardiac follow-up    History of Present Illness: [name] [name] is a 79 y.o. female seen recently in consultation by Dr. Wyline Mood and now status post CABG with LIMA to LAD, SVG to distal RCA, and SVG to OM1 as of June per Dr. Cliffton Asters.  She was seen by Mr. Vincenza Hews NP in June and now presents to establish follow-up with me. I reviewed extensive records and updated the chart.  She is here today with her daughter, a patient of mine.  She has been doing reasonably well, exercising on her own at home.  She enjoys walking, doing hand exercises.  She did not pursue cardiac rehab, has a large ventral hernia that limits her to some degree.  I reviewed her medications which are listed below.  Cardiac regimen is stable.  We are providing a prescription for nitroglycerin as needed.  She does not report any palpitations.  Her last LDL was 73 in June.  Past Medical History:  Diagnosis Date  . Anxiety   . CAD (coronary artery disease)    Multivessel status post CABG June 2021 - LIMA to LAD, SVG to distal RCA, SVG to OM1  . Chronic obstructive pulmonary disease, unspecified (HCC)   . Depression   . Essential hypertension   . GERD   . History of pneumonia   . Incarcerated ventral hernia   . Osteoarthritis   . Type 2 diabetes mellitus (HCC)     Past Surgical History:  Procedure Laterality Date  . CORONARY ARTERY BYPASS GRAFT N/A 05/22/2020   Procedure: CORONARY ARTERY BYPASS GRAFTING (CABG) x3 LIMA TO LAD, SVG TO OM1, SVG TO DISTAL RIGHT;  Surgeon: Corliss Skains, MD;  Location: [facility] OR;  Service: Open Heart Surgery;  Laterality: N/A;  . LEFT HEART CATH AND CORONARY ANGIOGRAPHY N/A 05/21/2020   Procedure: LEFT HEART CATH AND CORONARY ANGIOGRAPHY;  Surgeon: [place],  Peter M, MD;  Location: [facility] INVASIVE CV LAB;  Service: Cardiovascular;  Laterality: N/A;  . NEPHRECTOMY    . TEE WITHOUT CARDIOVERSION N/A 05/22/2020   Procedure: TRANSESOPHAGEAL ECHOCARDIOGRAM (TEE);  Surgeon: Corliss Skains, MD;  Location[facility] OR;  Service: Open Heart Surgery;  Laterality: N/A;  . VEIN HARVEST Right 05/22/2020   Procedure: Open Vein Harvest;  Surgeon: Corliss Skains, MD;  Location: [facility] OR;  Service: Open Heart Surgery;  Laterality: Right;  . VENTRAL HERNIA REPAIR  09/01/2017   mistaken entry    Current Outpatient Medications  Medication Sig Dispense Refill  . aspirin EC 325 MG EC tablet Take 1 tablet (325 mg total) by mouth daily. 30 tablet 0  . atorvastatin (LIPITOR) 40 MG tablet Take 1 tablet (40 mg total) by mouth daily. 90 tablet 1  . Biotin w/ Vitamins C & E (HAIR SKIN & NAILS GUMMIES PO) Take 2 each by mouth every morning.    . Cetirizine HCl 10 MG CAPS Take by mouth.    . fish oil-omega-3 fatty acids 1000 MG capsule Take 1 g by mouth 2 (two) times daily.    Marland Kitchen linagliptin (TRADJENTA) 5 MG TABS tablet Take 1 tablet (5 mg total) by mouth daily. 30 tablet 1  . metoprolol tartrate (LOPRESSOR) 25 MG tablet Take 0.5 tablets (12.5 mg total) by mouth  2 (two) times daily. 90 tablet 1  . Multiple Vitamin (MULITIVITAMIN WITH MINERALS) TABS Take 1 tablet by mouth daily.    . Multiple Vitamins-Minerals (AIRBORNE GUMMIES PO) Take 1 each by mouth every morning.    . traMADol (ULTRAM) 50 MG tablet Take by mouth every 6 (six) hours as needed.    . nitroGLYCERIN (NITROSTAT) 0.4 MG SL tablet Place 1 tablet (0.4 mg total) under the tongue every 5 (five) minutes x 3 doses as needed for chest pain (if no relief after 3rd dose, proceed to the ED for an evaluation or call 911). 25 tablet 3   No current facility-administered medications for this visit.   Allergies:  Contrast media [iodinated diagnostic agents], Morphine and related, Other, Penicillins, and Codeine   ROS:  No  palpitations or syncope.  Physical Exam: VS:  BP 122/64   Pulse 69   Ht 4\' 11"  (1.499 m)   Wt 156 lb (70.8 kg)   SpO2 95%   BMI 31.51 kg/m , BMI Body mass index is 31.51 kg/m.  Wt Readings from Last 3 Encounters:  08/31/20 156 lb (70.8 kg)  07/26/20 157 lb (71.2 kg)  07/13/20 154 lb (69.9 kg)    General: Patient appears comfortable at rest. HEENT: Conjunctiva and lids normal, wearing a mask. Neck: Supple, no elevated JVP or carotid bruits, no thyromegaly. Lungs: Clear to auscultation, nonlabored breathing at rest. Thorax: Well-healed midline sternal incision, no erythema or drainage. Cardiac: Regular rate and rhythm, no S3 or significant systolic murmur. Abdomen: Soft, large ventral hernia, bowel sounds present. Extremities: Healed vein harvest sites on the right.  No pitting edema.  ECG:  An ECG dated 05/23/2020 was personally reviewed today and demonstrated:  Sinus rhythm with nonspecific T wave changes.  Recent Labwork: 11/17/2019: TSH 2.02 05/17/2020: ALT 18; AST 17 05/23/2020: Magnesium 2.2 05/25/2020: Hemoglobin 10.4; Platelets 169 05/27/2020: BUN 26; Potassium 5.0; Sodium 138 05/30/2020: Creatinine, Ser 1.41     Component Value Date/Time   CHOL 159 05/18/2020 0341   TRIG 286 (H) 05/18/2020 0341   HDL 29 (L) 05/18/2020 0341   CHOLHDL 5.5 05/18/2020 0341   VLDL 57 (H) 05/18/2020 0341   LDLCALC 73 05/18/2020 0341   LDLCALC 109 (H) 03/07/2020 1137    Other Studies Reviewed Today:  Cardiac catheterization 05/21/2020:  Prox LAD to Mid LAD lesion is 95% stenosed.  Ost Cx to Prox Cx lesion is 100% stenosed.  Prox RCA lesion is 90% stenosed.  Mid RCA lesion is 100% stenosed.  LV end diastolic pressure is normal.   1. Critical 3 vessel occlusive CAD.     - 95% proximal to mid LAD    -100% proximal LCx. OM1 and OM2 fill by left to left collaterals    - 100% mid RCA. Distal vessel fills by right to right and left to right collaterals 2. Normal LVEDP  Echocardiogram  05/18/2020: 1. Left ventricular ejection fraction, by estimation, is 55 to 60%. The  left ventricle has normal function. The left ventricle has no regional  wall motion abnormalities. There is mild left ventricular hypertrophy.  Left ventricular diastolic parameters  are indeterminate.  2. Right ventricular systolic function is normal. The right ventricular  size is normal.  3. Left atrial size was mildly dilated.  4. The mitral valve is normal in structure. Trivial mitral valve  regurgitation. No evidence of mitral stenosis.  5. The aortic valve has an indeterminant number of cusps. Aortic valve  regurgitation is not visualized. No aortic  stenosis is present.  6. The inferior vena cava is normal in size with greater than 50%  respiratory variability, suggesting right atrial pressure of 3 mmHg.   Assessment and Plan:  1.  Multivessel CAD status post CABG in June as outlined above.  LVEF normal at 55 to 60% by echocardiogram.  She is doing well at this time.  I encouraged her to continue regular exercise in the form of walking.  Continue aspirin (will reduce dose at next visit), Lipitor, and Lopressor.  Prescription provided for as needed nitroglycerin.  2.  Mixed hyperlipidemia, check FLP for next visit.  Continue Lipitor at 40 mg daily.  3.  Essential hypertension, blood pressure is adequately controlled today.  Medication Adjustments/Labs and Tests Ordered: Current medicines are reviewed at length with the patient today.  Concerns regarding medicines are outlined above.   Tests Ordered: Orders Placed This Encounter  Procedures  . Lipid panel    Medication Changes: Meds ordered this encounter  Medications  . nitroGLYCERIN (NITROSTAT) 0.4 MG SL tablet    Sig: Place 1 tablet (0.4 mg total) under the tongue every 5 (five) minutes x 3 doses as needed for chest pain (if no relief after 3rd dose, proceed to the ED for an evaluation or call 911).    Dispense:  25 tablet     Refill:  3    Disposition:  Follow up 4 months in the [place] office.  Signed, Jonelle Sidle, MD, [facility] 08/31/2020 11:20 AM    [facility] at [facility] [address] [place], [place], [place] [address] Phone: [phone]; Fax: [phone]

## 2020-08-31 ENCOUNTER — Encounter: Payer: Self-pay | Admitting: Cardiology

## 2020-08-31 ENCOUNTER — Ambulatory Visit (INDEPENDENT_AMBULATORY_CARE_PROVIDER_SITE_OTHER): Payer: Medicare Other | Admitting: Cardiology

## 2020-08-31 VITALS — BP 122/64 | HR 69 | Ht 59.0 in | Wt 156.0 lb

## 2020-08-31 DIAGNOSIS — I2510 Atherosclerotic heart disease of native coronary artery without angina pectoris: Secondary | ICD-10-CM | POA: Diagnosis not present

## 2020-08-31 DIAGNOSIS — I10 Essential (primary) hypertension: Secondary | ICD-10-CM | POA: Diagnosis not present

## 2020-08-31 DIAGNOSIS — I25119 Atherosclerotic heart disease of native coronary artery with unspecified angina pectoris: Secondary | ICD-10-CM

## 2020-08-31 DIAGNOSIS — E782 Mixed hyperlipidemia: Secondary | ICD-10-CM | POA: Diagnosis not present

## 2020-08-31 MED ORDER — NITROGLYCERIN 0.4 MG SL SUBL
0.4000 mg | SUBLINGUAL_TABLET | SUBLINGUAL | 3 refills | Status: DC | PRN
Start: 2020-08-31 — End: 2022-01-02

## 2020-08-31 NOTE — Patient Instructions (Addendum)
Medication Instructions:   Your physician recommends that you continue on your current medications as directed. Please refer to the Current Medication list given to you today.  Nitroglycerin 0.4 mg prescription sent to your pharmacy. Dissolve one under tongue for chest pain every 5 minutes up to 3 doses. If no relief, proceed to ED.  Labwork: Your physician recommends that you return for a FASTING lipid profile: in 4 months just before your next visit. Please do not eat or drink for at least 8 hours when you have this done. You may take your medications that morning with a sip of water.  This may be done at [facility] or [employer] ([address] [place]) Monday-Friday from 8:00 am - 4:00 pm. No appointment is needed.  Testing/Procedures:  None  Follow-Up:  Your physician recommends that you schedule a follow-up appointment in: 4 months.  Any Other Special Instructions Will Be Listed Below (If Applicable).  If you need a refill on your cardiac medications before your next appointment, please call your pharmacy.

## 2020-09-03 ENCOUNTER — Other Ambulatory Visit: Payer: Self-pay | Admitting: *Deleted

## 2020-09-03 ENCOUNTER — Ambulatory Visit: Payer: Self-pay | Admitting: *Deleted

## 2020-09-03 NOTE — Patient Outreach (Signed)
[facility]) Care Management  [facility] Care Manager  09/03/2020   [name] [name] 17-Jan-1941 [id number]  Subjective: Telephone call to patient's home / mobile number, spoke with patient, and HIPAA verified.  Patient states she is doing well, still has periodic chest soreness, body swelling, and MD is aware.   States she has completed home health services last week.  States she had follow up appointment with cardiologist on 08/31/2020 and appointment went well.   States they received EMMI educational materials and have no questions.   Spoke with patient's daughter / designated party release, states patient is having some constipation, and is planning to follow up with primary provider to see if it is okay for patient to take metamucil.   Patient states she does not have any Secretary/administrator, transportation, [employer], or pharmacy needs at this time.  States she is very appreciative of the follow up and is in agreement to continue to receive [facility] Care Management information / services.    Objective: Per KPN (Knowledge Performance Now, point of care tool) and chart review,patient hospitalized 05/17/2020 - 06/01/2020 for CAD, status post CABG times 3 on 05/22/2020. Patient at skilled nursing facility [facility]) 06/01/2020 - 06/22/2020 for rehab. Patient also has a history of hypertension, diabetes, COPD, hyperlipidemia, anemia, Stage 3bChronicKidneyDisease(status postLeftnephrectomy in 1970's complicated by abdominal wall hernia), and major depressive disorder.    Encounter Medications:  Outpatient Encounter Medications as of 09/03/2020  Medication Sig  . aspirin EC 325 MG EC tablet Take 1 tablet (325 mg total) by mouth daily.  Marland Kitchen atorvastatin (LIPITOR) 40 MG tablet Take 1 tablet (40 mg total) by mouth daily.  . Biotin w/ Vitamins C & E (HAIR SKIN & NAILS GUMMIES PO) Take 2 each by mouth every morning.  . Cetirizine HCl 10 MG CAPS Take by mouth.  . fish oil-omega-3  fatty acids 1000 MG capsule Take 1 g by mouth 2 (two) times daily.  Marland Kitchen linagliptin (TRADJENTA) 5 MG TABS tablet Take 1 tablet (5 mg total) by mouth daily.  . metoprolol tartrate (LOPRESSOR) 25 MG tablet Take 0.5 tablets (12.5 mg total) by mouth 2 (two) times daily.  . Multiple Vitamin (MULITIVITAMIN WITH MINERALS) TABS Take 1 tablet by mouth daily.  . Multiple Vitamins-Minerals (AIRBORNE GUMMIES PO) Take 1 each by mouth every morning.  . nitroGLYCERIN (NITROSTAT) 0.4 MG SL tablet Place 1 tablet (0.4 mg total) under the tongue every 5 (five) minutes x 3 doses as needed for chest pain (if no relief after 3rd dose, proceed to the ED for an evaluation or call 911).  . traMADol (ULTRAM) 50 MG tablet Take by mouth every 6 (six) hours as needed.   No facility-administered encounter medications on file as of 09/03/2020.    Functional Status:  In your present state of health, do you have any difficulty performing the following activities: 07/04/2020 05/18/2020  Hearing? N N  Vision? N N  Difficulty concentrating or making decisions? N N  Walking or climbing stairs? N Y  Comment Per patient follows mobility restrictions. -  Dressing or bathing? N N  Comment Per patient follows mobility restrictions. -  Doing errands, shopping? Y N  Comment Per patient follows mobility restrictions. -  Preparing Food and eating ? N -  Using the Toilet? N -  In the past six months, have you accidently leaked urine? N -  Do you have problems with loss of bowel control? N -  Managing your Medications? N -  Managing your Finances? N -  Housekeeping or managing your Housekeeping? Y -  Some recent data might be hidden    Fall/Depression Screening: Fall Risk  07/26/2020 07/04/2020 03/07/2020  Falls in the past year? 0 0 0  Comment - - -  Number falls in past yr: 0 0 0  Comment - - -  Injury with Fall? 0 0 -  Risk for fall due to : No Fall Risks Other (Comment) -  Risk for fall due to: Comment - Risk due to recent  surgery per patient -  Follow up Falls evaluation completed Falls evaluation completed;Education provided;Falls prevention discussed Falls evaluation completed   PHQ 2/9 Scores 07/26/2020 07/04/2020 06/27/2020 03/07/2020  PHQ - 2 Score 0 0 1 1  PHQ- 9 Score 0 - - -    Assessment: Received Medicare [facility] Care Management Post Acute Care Coordinator Referral on 06/22/2020. Referral source: Raiford Noble. Referral reason: Reason for consult Please assign to [facility] RNCM and [facility] LCSW,Diagnoses of Diabetes,COPD/ Pneumonia,Other Diagnosis: CAD, s/p CABG, depression, anxiety. Transition of care assessment completed and will follow up for other diagnosis care management needs.    Goals Addressed              This Visit's Progress   .  Patient Stated she wants get her life back to normal and stay out of the hospital. (pt-stated)        CARE PLAN ENTRY (see longitudinal plan of care for additional care plan information)  Current Barriers:  Marland Kitchen Knowledge Deficits related to CAD, recent hospitalization, and status post CABG.   Nurse Case Manager Clinical Goal(s):  Marland Kitchen Over the next 60 days, patient will not experience hospital admission. Hospital Admissions in last 6 months = 1 . Over the next 30 days, patient will attend all scheduled medical appointments: surgeon, primary care provider, podiatrist, and cardiologist.   Interventions:  . Inter-disciplinary care team collaboration (see longitudinal plan of care) . Provided education to patient and/or caregiver about advanced directives . Advised patient to call primary provider if sooner follow up appointment needed. . Provided education to patient re: signs and symptoms to report to provider, how to reach on-call provider after hours.  . Discussed plans with patient for ongoing care management follow up and provided patient with direct contact information for care management team . Provided patient with Diabetes educational materials related to Diabetes  and Diet . Reviewed scheduled/upcoming provider appointments including:  . Advised patient, providing education and rationale, to weigh daily and record, calling cardiologist for weight gain of 3lbs overnight or 5 pounds in a week.  . surgeon appointment on 08/31/2020 Patient Self Care Activities:  . Patient verbalizes understanding of plan to report signs and symptoms to MD as needed. . Self administers medications as prescribed . Attends all scheduled provider appointments . Calls pharmacy for medication refills . Performs ADL's independently . Performs IADL's independently . Calls provider office for new concerns or questions . Patient states she is elevating leg as needed and monitoring swelling . Patient states she is taking shower independently.  Updated 09/03/2020         Plan: Plan:  RNCM will call patientand / or patient's daughter / designated party release (Cindy Martin)for telephone outreach attempt, within 30 business days, other diagnosis care management follow up.     Shawntell Dixson H. Gardiner Barefoot, BSN, CCM [facility] Care Management [facility] Telephonic CM Phone: [phone] Fax: [phone]

## 2020-09-04 ENCOUNTER — Ambulatory Visit: Payer: Self-pay | Admitting: *Deleted

## 2020-09-27 ENCOUNTER — Other Ambulatory Visit: Payer: Self-pay | Admitting: *Deleted

## 2020-09-27 NOTE — Patient Outreach (Signed)
[facility]) Care Management  [place]  09/27/2020   [name] [name] Aug 19, 1941 [id number]  Subjective: Telephone call to patient's home / mobile number, times 2, spoke with patient, and HIPAA verified.   States she is doing well, increasing activities of daily living, increasing home management activities, continues home exercise program, and has not had any follow up appointments since last outreach with this RNCM.   States next primary provider appointment is on 11/27/2020.   Patient states she does not have any education material, transition of care, care coordination, disease management, disease monitoring, transportation, community resource, or pharmacy needs at this time.   RNCM advised patient, moving to another position after 09/27/2020, patient voices understanding, has no care management needs at this time, and will call [facility] main number if assistance needed in the future.   States she is very appreciative of the follow up, RNCM's assistance, and always looked forward to RNCM's call.    Objective: Per KPN (Knowledge Performance Now, point of care tool) and chart review,patient hospitalized 05/17/2020 - 06/01/2020 for CAD, status post CABG times 3 on 05/22/2020. Patient at skilled nursing facility [facility]) 06/01/2020 - 06/22/2020 for rehab. Patient also has a history of hypertension, diabetes, COPD, hyperlipidemia, anemia, Stage 3bChronicKidneyDisease(status postLeftnephrectomy in 6384'T complicated by abdominal wall hernia), and major depressive disorder.   Encounter Medications:  Outpatient Encounter Medications as of 09/27/2020  Medication Sig Note  . aspirin EC 325 MG EC tablet Take 1 tablet (325 mg total) by mouth daily.   Marland Kitchen atorvastatin (LIPITOR) 40 MG tablet Take 1 tablet (40 mg total) by mouth daily.   . Biotin w/ Vitamins C & E (HAIR SKIN & NAILS GUMMIES PO) Take 2 each by mouth every morning.   . Cetirizine HCl 10 MG CAPS  Take by mouth.   . fish oil-omega-3 fatty acids 1000 MG capsule Take 1 g by mouth 2 (two) times daily.   Marland Kitchen linagliptin (TRADJENTA) 5 MG TABS tablet Take 1 tablet (5 mg total) by mouth daily.   . metoprolol tartrate (LOPRESSOR) 25 MG tablet Take 0.5 tablets (12.5 mg total) by mouth 2 (two) times daily.   . Multiple Vitamin (MULITIVITAMIN WITH MINERALS) TABS Take 1 tablet by mouth daily.   . Multiple Vitamins-Minerals (AIRBORNE GUMMIES PO) Take 1 each by mouth every morning.   . nitroGLYCERIN (NITROSTAT) 0.4 MG SL tablet Place 1 tablet (0.4 mg total) under the tongue every 5 (five) minutes x 3 doses as needed for chest pain (if no relief after 3rd dose, proceed to the ED for an evaluation or call 911). (Patient not taking: Reported on 09/27/2020) 09/27/2020: Patient states has not needed.   . traMADol (ULTRAM) 50 MG tablet Take by mouth every 6 (six) hours as needed. (Patient not taking: Reported on 09/27/2020) 09/27/2020: Patient states has not needed.    No facility-administered encounter medications on file as of 09/27/2020.    Functional Status:  In your present state of health, do you have any difficulty performing the following activities: 07/04/2020 05/18/2020  Hearing? N N  Vision? N N  Difficulty concentrating or making decisions? N N  Walking or climbing stairs? N Y  Comment Per patient follows mobility restrictions. -  Dressing or bathing? N N  Comment Per patient follows mobility restrictions. -  Doing errands, shopping? Y N  Comment Per patient follows mobility restrictions. -  Preparing Food and eating ? N -  Using the Toilet? N -  In  the past six months, have you accidently leaked urine? N -  Do you have problems with loss of bowel control? N -  Managing your Medications? N -  Managing your Finances? N -  Housekeeping or managing your Housekeeping? Y -  Some recent data might be hidden    Fall/Depression Screening: Fall Risk  07/26/2020 07/04/2020 03/07/2020  Falls in the  past year? 0 0 0  Comment - - -  Number falls in past yr: 0 0 0  Comment - - -  Injury with Fall? 0 0 -  Risk for fall due to : No Fall Risks Other (Comment) -  Risk for fall due to: Comment - Risk due to recent surgery per patient -  Follow up Falls evaluation completed Falls evaluation completed;Education provided;Falls prevention discussed Falls evaluation completed   PHQ 2/9 Scores 07/26/2020 07/04/2020 06/27/2020 03/07/2020  PHQ - 2 Score 0 0 1 1  PHQ- 9 Score 0 - - -    Assessment: Received Medicare [place] Acute Care Coordinator Referral on 06/22/2020. Referral source: Marthenia Rolling. Referral reason: Reason for consult Please assign to [place] and [facility] LCSW,Diagnoses of Diabetes,COPD/ Pneumonia,Other Diagnosis: CAD, s/p CABG, depression, anxiety. Transition of care assessment completed, all care management goals met, and will proceed with case closure due to no care management needs.   Goals Addressed              This Visit's Progress   .  COMPLETED: Patient Stated she wants get her life back to normal and stay out of the hospital. (pt-stated)   On track     [place] (see longitudinal plan of care for additional care plan information)  Current Barriers:  Marland Kitchen Knowledge Deficits related to CAD, recent hospitalization, and status post CABG.   Nurse Case Manager Clinical Goal(s):  Marland Kitchen Over the next 60 days, patient will not experience hospital admission. Hospital Admissions in last 6 months = 1 . Over the next 30 days, patient will attend all scheduled medical appointments: surgeon, primary care provider, podiatrist, and cardiologist.   Interventions:  . Inter-disciplinary care team collaboration (see longitudinal plan of care) . Provided education to patient and/or caregiver about advanced directives . Advised patient to call primary provider if sooner follow up appointment needed. . Provided education to patient re: signs and symptoms to report to  provider, how to reach on-call provider after hours.  . Reviewed medications with patient and discussed status of medications . Discussed plans with patient for ongoing care management follow up and provided patient with direct contact information for care management team . Provided patient with Diabetes educational materials related to Diabetes and Diet . Reviewed scheduled/upcoming provider appointments including:  . Advised patient, providing education and rationale, to weigh daily and record, calling cardiologist for weight gain of 3lbs overnight or 5 pounds in a week.  . surgeon appointment on 08/31/2020 Patient Self Care Activities:  . Patient verbalizes understanding of plan to report signs and symptoms to MD as needed. . Self administers medications as prescribed . Attends all scheduled provider appointments . Calls pharmacy for medication refills . Performs ADL's independently . Performs IADL's independently . Calls provider office for new concerns or questions . Patient states she is elevating leg as needed and monitoring swelling . Patient states she is taking shower independently and has increased activities of daily living  Updated 09/27/2020         Plan: RNCM will send patient case closure  letter. RNCM will complete case closure due to follow up completed / no care management needs, goals met.  RNCM will send MD case closure letter.       Cherylee Rawlinson H. Annia Friendly, BSN, [place] [facility] Telephonic CM Phone: [phone] Fax: [phone]

## 2020-10-02 ENCOUNTER — Telehealth: Payer: Self-pay | Admitting: Family Medicine

## 2020-10-02 NOTE — Telephone Encounter (Signed)
Pt needs a visit either virtual or in office please

## 2020-10-02 NOTE — Telephone Encounter (Signed)
Patients daughter calling states patient is having a lot of pain /stiffness in her left hand what would we recommend or does she need a mychart visit ? P# [phone]

## 2020-10-03 ENCOUNTER — Encounter: Payer: Self-pay | Admitting: Family Medicine

## 2020-10-03 ENCOUNTER — Other Ambulatory Visit: Payer: Self-pay

## 2020-10-03 ENCOUNTER — Telehealth (INDEPENDENT_AMBULATORY_CARE_PROVIDER_SITE_OTHER): Payer: Medicare Other | Admitting: Family Medicine

## 2020-10-03 VITALS — BP 122/64 | Ht 59.0 in | Wt 156.0 lb

## 2020-10-03 DIAGNOSIS — M25512 Pain in left shoulder: Secondary | ICD-10-CM | POA: Diagnosis not present

## 2020-10-03 DIAGNOSIS — M25642 Stiffness of left hand, not elsewhere classified: Secondary | ICD-10-CM | POA: Insufficient documentation

## 2020-10-03 DIAGNOSIS — E1121 Type 2 diabetes mellitus with diabetic nephropathy: Secondary | ICD-10-CM

## 2020-10-03 NOTE — Assessment & Plan Note (Signed)
Additionally, she reports she her blood sugars are high, but she is taking her medication. She had a hard time with diet recall over the phone. Is willing to have diet info mailed and lab check in Dec at follow up.

## 2020-10-03 NOTE — Assessment & Plan Note (Signed)
Questionable arthritis in nature vs neuropathy/radicupathy Referral to Ortho, might benefit from PT  Will wait for Ortho's assessment.

## 2020-10-03 NOTE — Progress Notes (Signed)
Virtual Visit via Telephone Note   This visit type was conducted due to national recommendations for restrictions regarding the COVID-19 Pandemic (e.g. social distancing) in an effort to limit this patient's exposure and mitigate transmission in our community.  Due to her co-morbid illnesses, this patient is at least at moderate risk for complications without adequate follow up.  This format is felt to be most appropriate for this patient at this time.  The patient did not have access to video technology/had technical difficulties with video requiring transitioning to audio format only (telephone).  All issues noted in this document were discussed and addressed.  No physical exam could be performed with this format.    Evaluation Performed:  Follow-up visit  Date:  10/03/2020   ID:  [name] [name], DOB Jun 13, 1941, MRN [id number]  Patient Location: Home Provider Location: Office/Clinic  Location of Patient: Home Location of Provider: Telehealth Consent was obtain for visit to be over via telehealth. I verified that I am speaking with the correct person using two identifiers.  PCP:  Freddy Finner, NP   Chief Complaint:  Left hand pain  History of Present Illness:    [name] [name] is a 79 y.o. female who presents for an acute visit for Left hand pain and questionable shoulder pain. She reports that in June when she was hospitalized the IV in her left hand caused her pain that seems to be on going and still causing concerns. She said it wasn't trouble until she had it removed. She reported the pain to the staff and they said maybe the IV hit a nerve or irritated the surrounding area. She was showed exercised to help with this. She reports doing the exercises and that they help with stiffness, but she also reports numbness in the middle to little fingers. She reports dropping things easily with that hand as well. She has not taken tylenol of any other pain reliever. She reports it comes  and goes. She does have a history of OA.  In conversation she mentions her left shoulder popped and she reported increased discomfort and that main the hand worse she feels.    Additionally, she reports she her blood sugars are high, but she is taking her medication. She has a hard time with diet recall over the phone. Is willing to have diet info mailed and lab check in Dec at follow up.   Denies other concerns.   The patient does not have symptoms concerning for COVID-19 infection (fever, chills, cough, or new shortness of breath).   Past Medical, Surgical, Social History, Allergies, and Medications have been Reviewed.  Past Medical History:  Diagnosis Date  . Anxiety   . CAD (coronary artery disease)    Multivessel status post CABG June 2021 - LIMA to LAD, SVG to distal RCA, SVG to OM1  . Chronic obstructive pulmonary disease, unspecified (HCC)   . Depression   . Essential hypertension   . GERD   . History of pneumonia   . Incarcerated ventral hernia   . Osteoarthritis   . Type 2 diabetes mellitus (HCC)    Past Surgical History:  Procedure Laterality Date  . CORONARY ARTERY BYPASS GRAFT N/A 05/22/2020   Procedure: CORONARY ARTERY BYPASS GRAFTING (CABG) x3 LIMA TO LAD, SVG TO OM1, SVG TO DISTAL RIGHT;  Surgeon: Corliss Skains, MD;  Location: [facility] OR;  Service: Open Heart Surgery;  Laterality: N/A;  . LEFT HEART CATH AND CORONARY ANGIOGRAPHY N/A 05/21/2020  Procedure: LEFT HEART CATH AND CORONARY ANGIOGRAPHY;  Surgeon: Swaz[place], Peter M, MD;  Location: [facility] INVASIVE CV LAB;  Service: Cardiovascular;  Laterality: N/A;  . NEPHRECTOMY    . TEE WITHOUT CARDIOVERSION N/A 05/22/2020   Procedure: TRANSESOPHAGEAL ECHOCARDIOGRAM (TEE);  Surgeon: Corliss Skains, MD;  Location: [facility] OR;  Service: Open Heart Surgery;  Laterality: N/A;  . VEIN HARVEST Right 05/22/2020   Procedure: Open Vein Harvest;  Surgeon: Corliss Skains, MD;  Location: [facility] OR;  Service: Open Heart Surgery;  Laterality:  Right;  . VENTRAL HERNIA REPAIR  09/01/2017   mistaken entry     Current Meds  Medication Sig  . aspirin EC 325 MG EC tablet Take 1 tablet (325 mg total) by mouth daily.  Marland Kitchen atorvastatin (LIPITOR) 40 MG tablet Take 1 tablet (40 mg total) by mouth daily.  . Biotin w/ Vitamins C & E (HAIR SKIN & NAILS GUMMIES PO) Take 2 each by mouth every morning.  . Cetirizine HCl 10 MG CAPS Take by mouth.  . fish oil-omega-3 fatty acids 1000 MG capsule Take 1 g by mouth 2 (two) times daily.  Marland Kitchen linagliptin (TRADJENTA) 5 MG TABS tablet Take 1 tablet (5 mg total) by mouth daily.  . metoprolol tartrate (LOPRESSOR) 25 MG tablet Take 0.5 tablets (12.5 mg total) by mouth 2 (two) times daily.  . Multiple Vitamin (MULITIVITAMIN WITH MINERALS) TABS Take 1 tablet by mouth daily.  . Multiple Vitamins-Minerals (AIRBORNE GUMMIES PO) Take 1 each by mouth every morning.  . nitroGLYCERIN (NITROSTAT) 0.4 MG SL tablet Place 1 tablet (0.4 mg total) under the tongue every 5 (five) minutes x 3 doses as needed for chest pain (if no relief after 3rd dose, proceed to the ED for an evaluation or call 911).  . traMADol (ULTRAM) 50 MG tablet Take by mouth every 6 (six) hours as needed.      Allergies:   Contrast media [iodinated diagnostic agents], Morphine and related, Other, Penicillins, and Codeine   ROS:   Please see the history of present illness.    All other systems reviewed and are negative.   Labs/Other Tests and Data Reviewed:    Recent Labs: 11/17/2019: TSH 2.02 05/17/2020: ALT 18 05/23/2020: Magnesium 2.2 05/25/2020: Hemoglobin 10.4; Platelets 169 05/27/2020: BUN 26; Potassium 5.0; Sodium 138 05/30/2020: Creatinine, Ser 1.41   Recent Lipid Panel Lab Results  Component Value Date/Time   CHOL 159 05/18/2020 03:41 AM   TRIG 286 (H) 05/18/2020 03:41 AM   HDL 29 (L) 05/18/2020 03:41 AM   CHOLHDL 5.5 05/18/2020 03:41 AM   LDLCALC 73 05/18/2020 03:41 AM   LDLCALC 109 (H) 03/07/2020 11:37 AM    Wt Readings from Last  3 Encounters:  10/03/20 156 lb (70.8 kg)  08/31/20 156 lb (70.8 kg)  07/26/20 157 lb (71.2 kg)     Objective:    Vital Signs:  BP 122/64   Ht 4\' 11"  (1.499 m)   Wt 156 lb (70.8 kg)   BMI 31.51 kg/m    VITAL SIGNS:  reviewed GEN:  no acute distress RESPIRATORY:  no shortness of breath in conversation PSYCH:  normal affect and mood   ASSESSMENT & PLAN:    1. Type 2 diabetes mellitus with diabetic nephropathy, unspecified whether long term insulin use (HCC)   2. Joint stiffness of hand, left  - Ambulatory referral to Orthopedic Surgery  3. Acute pain of left shoulder  - Ambulatory referral to Orthopedic Surgery    Time:   Today,  I have spent 12 minutes with the patient with telehealth technology discussing the above problems.     Medication Adjustments/Labs and Tests Ordered: Current medicines are reviewed at length with the patient today.  Concerns regarding medicines are outlined above.   Tests Ordered: Orders Placed This Encounter  Procedures  . Ambulatory referral to Orthopedic Surgery    Medication Changes: No orders of the defined types were placed in this encounter.    Note: This dictation was prepared with Dragon dictation along with smaller phrase technology. Similar sounding words can be transcribed inadequately or may not be corrected upon review. Any transcriptional errors that result from this process are unintentional.      Disposition:  Follow up 11/27/2020 as scheduled  Signed, Freddy Finner, NP  10/03/2020 9:59 AM     [facility]

## 2020-10-03 NOTE — Assessment & Plan Note (Signed)
Referral to Ortho- might need PT as well. Will wait their assessment.

## 2020-10-03 NOTE — Patient Instructions (Addendum)
HAPPY FALL!  I appreciate the opportunity to provide you with care for your health and wellness. Today we discussed: Left hand pain  Follow up: as scheuduled  No labs   Referrals today: Ortho dr for hand and shoulder   HAPPY BELATED BIRTHDAY!  I have attached diabetic food information for you to review to see if that might help your blood sugars. We might need to have you on another medication if the diet does not help.  Please continue to practice social distancing to keep you, your family, and our community safe.  If you must go out, please wear a mask and practice good handwashing.  It was a pleasure to see you and I look forward to continuing to work together on your health and well-being. Please do not hesitate to call the office if you need care or have questions about your care.  Have a wonderful day and week. With Gratitude, Tereasa Coop, DNP, AGNP-BC   Diabetes Mellitus and Nutrition, Adult When you have diabetes (diabetes mellitus), it is very important to have healthy eating habits because your blood sugar (glucose) levels are greatly affected by what you eat and drink. Eating healthy foods in the appropriate amounts, at about the same times every day, can help you:  Control your blood glucose.  Lower your risk of heart disease.  Improve your blood pressure.  Reach or maintain a healthy weight. Every person with diabetes is different, and each person has different needs for a meal plan. Your health care provider may recommend that you work with a diet and nutrition specialist (dietitian) to make a meal plan that is best for you. Your meal plan may vary depending on factors such as:  The calories you need.  The medicines you take.  Your weight.  Your blood glucose, blood pressure, and cholesterol levels.  Your activity level.  Other health conditions you have, such as heart or kidney disease. How do carbohydrates affect me? Carbohydrates, also called  carbs, affect your blood glucose level more than any other type of food. Eating carbs naturally raises the amount of glucose in your blood. Carb counting is a method for keeping track of how many carbs you eat. Counting carbs is important to keep your blood glucose at a healthy level, especially if you use insulin or take certain oral diabetes medicines. It is important to know how many carbs you can safely have in each meal. This is different for every person. Your dietitian can help you calculate how many carbs you should have at each meal and for each snack. Foods that contain carbs include:  Bread, cereal, rice, pasta, and crackers.  Potatoes and corn.  Peas, beans, and lentils.  Milk and yogurt.  Fruit and juice.  Desserts, such as cakes, cookies, ice cream, and candy. How does alcohol affect me? Alcohol can cause a sudden decrease in blood glucose (hypoglycemia), especially if you use insulin or take certain oral diabetes medicines. Hypoglycemia can be a life-threatening condition. Symptoms of hypoglycemia (sleepiness, dizziness, and confusion) are similar to symptoms of having too much alcohol. If your health care provider says that alcohol is safe for you, follow these guidelines:  Limit alcohol intake to no more than 1 drink per day for nonpregnant women and 2 drinks per day for men. One drink equals 12 oz of beer, 5 oz of wine, or 1 oz of hard liquor.  Do not drink on an empty stomach.  Keep yourself hydrated with water, diet soda,  or unsweetened iced tea.  Keep in mind that regular soda, juice, and other mixers may contain a lot of sugar and must be counted as carbs. What are tips for following this plan?  Reading food labels  Start by checking the serving size on the "Nutrition Facts" label of packaged foods and drinks. The amount of calories, carbs, fats, and other nutrients listed on the label is based on one serving of the item. Many items contain more than one serving  per package.  Check the total grams (g) of carbs in one serving. You can calculate the number of servings of carbs in one serving by dividing the total carbs by 15. For example, if a food has 30 g of total carbs, it would be equal to 2 servings of carbs.  Check the number of grams (g) of saturated and trans fats in one serving. Choose foods that have low or no amount of these fats.  Check the number of milligrams (mg) of salt (sodium) in one serving. Most people should limit total sodium intake to less than 2,300 mg per day.  Always check the nutrition information of foods labeled as "low-fat" or "nonfat". These foods may be higher in added sugar or refined carbs and should be avoided.  Talk to your dietitian to identify your daily goals for nutrients listed on the label. Shopping  Avoid buying canned, premade, or processed foods. These foods tend to be high in fat, sodium, and added sugar.  Shop around the outside edge of the grocery store. This includes fresh fruits and vegetables, bulk grains, fresh meats, and fresh dairy. Cooking  Use low-heat cooking methods, such as baking, instead of high-heat cooking methods like deep frying.  Cook using healthy oils, such as olive, canola, or sunflower oil.  Avoid cooking with butter, cream, or high-fat meats. Meal planning  Eat meals and snacks regularly, preferably at the same times every day. Avoid going long periods of time without eating.  Eat foods high in fiber, such as fresh fruits, vegetables, beans, and whole grains. Talk to your dietitian about how many servings of carbs you can eat at each meal.  Eat 4-6 ounces (oz) of lean protein each day, such as lean meat, chicken, fish, eggs, or tofu. One oz of lean protein is equal to: ? 1 oz of meat, chicken, or fish. ? 1 egg. ?  cup of tofu.  Eat some foods each day that contain healthy fats, such as avocado, nuts, seeds, and fish. Lifestyle  Check your blood glucose  regularly.  Exercise regularly as told by your health care provider. This may include: ? 150 minutes of moderate-intensity or vigorous-intensity exercise each week. This could be brisk walking, biking, or water aerobics. ? Stretching and doing strength exercises, such as yoga or weightlifting, at least 2 times a week.  Take medicines as told by your health care provider.  Do not use any products that contain nicotine or tobacco, such as cigarettes and e-cigarettes. If you need help quitting, ask your health care provider.  Work with a Veterinary surgeon or diabetes educator to identify strategies to manage stress and any emotional and social challenges. Questions to ask a health care provider  Do I need to meet with a diabetes educator?  Do I need to meet with a dietitian?  What number can I call if I have questions?  When are the best times to check my blood glucose? Where to find more information:  American Diabetes Association: diabetes.org  Academy of Nutrition and Dietetics: www.eatright.[employer]  National Institute of Diabetes and Digestive and Kidney Diseases (NIH): [employer].tnwww.niddk.nih.gov Summary  A healthy meal plan will help you control your blood glucose and maintain a healthy lifestyle.  Working with a diet and nutrition specialist (dietitian) can help you make a meal plan that is best for you.  Keep in mind that carbohydrates (carbs) and alcohol have immediate effects on your blood glucose levels. It is important to count carbs and to use alcohol carefully. This information is not intended to replace advice given to you by your health care provider. Make sure you discuss any questions you have with your health care provider. Document Revised: 11/13/2017 Document Reviewed: 01/05/2017 Elsevier Patient Education  2020 Elsevier Inc.   Carbohydrate Counting for Diabetes Mellitus, Adult  Carbohydrate counting is a method of keeping track of how many carbohydrates you eat. Eating  carbohydrates naturally increases the amount of sugar (glucose) in the blood. Counting how many carbohydrates you eat helps keep your blood glucose within normal limits, which helps you manage your diabetes (diabetes mellitus). It is important to know how many carbohydrates you can safely have in each meal. This is different for every person. A diet and nutrition specialist (registered dietitian) can help you make a meal plan and calculate how many carbohydrates you should have at each meal and snack. Carbohydrates are found in the following foods:  Grains, such as breads and cereals.  Dried beans and soy products.  Starchy vegetables, such as potatoes, peas, and corn.  Fruit and fruit juices.  Milk and yogurt.  Sweets and snack foods, such as cake, cookies, candy, chips, and soft drinks. How do I count carbohydrates? There are two ways to count carbohydrates in food. You can use either of the methods or a combination of both. Reading "Nutrition Facts" on packaged food The "Nutrition Facts" list is included on the labels of almost all packaged foods and beverages in the [place] It includes:  The serving size.  Information about nutrients in each serving, including the grams (g) of carbohydrate per serving. To use the "Nutrition Facts":  Decide how many servings you will have.  Multiply the number of servings by the number of carbohydrates per serving.  The resulting number is the total amount of carbohydrates that you will be having. Learning standard serving sizes of other foods When you eat carbohydrate foods that are not packaged or do not include "Nutrition Facts" on the label, you need to measure the servings in order to count the amount of carbohydrates:  Measure the foods that you will eat with a food scale or measuring cup, if needed.  Decide how many standard-size servings you will eat.  Multiply the number of servings by 15. Most carbohydrate-rich foods have about 15 g of  carbohydrates per serving. ? For example, if you eat 8 oz (170 g) of strawberries, you will have eaten 2 servings and 30 g of carbohydrates (2 servings x 15 g = 30 g).  For foods that have more than one food mixed, such as soups and casseroles, you must count the carbohydrates in each food that is included. The following list contains standard serving sizes of common carbohydrate-rich foods. Each of these servings has about 15 g of carbohydrates:   hamburger bun or  English muffin.   oz (15 mL) syrup.   oz (14 g) jelly.  1 slice of bread.  1 six-inch tortilla.  3 oz (85 g) cooked rice or pasta.  4 oz (113 g) cooked dried beans.  4 oz (113 g) starchy vegetable, such as peas, corn, or potatoes.  4 oz (113 g) hot cereal.  4 oz (113 g) mashed potatoes or  of a large baked potato.  4 oz (113 g) canned or frozen fruit.  4 oz (120 mL) fruit juice.  4-6 crackers.  6 chicken nuggets.  6 oz (170 g) unsweetened dry cereal.  6 oz (170 g) plain fat-free yogurt or yogurt sweetened with artificial sweeteners.  8 oz (240 mL) milk.  8 oz (170 g) fresh fruit or one small piece of fruit.  24 oz (680 g) popped popcorn. Example of carbohydrate counting Sample meal  3 oz (85 g) chicken breast.  6 oz (170 g) brown rice.  4 oz (113 g) corn.  8 oz (240 mL) milk.  8 oz (170 g) strawberries with sugar-free whipped topping. Carbohydrate calculation 1. Identify the foods that contain carbohydrates: ? Rice. ? Corn. ? Milk. ? Strawberries. 2. Calculate how many servings you have of each food: ? 2 servings rice. ? 1 serving corn. ? 1 serving milk. ? 1 serving strawberries. 3. Multiply each number of servings by 15 g: ? 2 servings rice x 15 g = 30 g. ? 1 serving corn x 15 g = 15 g. ? 1 serving milk x 15 g = 15 g. ? 1 serving strawberries x 15 g = 15 g. 4. Add together all of the amounts to find the total grams of carbohydrates eaten: ? 30 g + 15 g + 15 g + 15 g = 75 g of  carbohydrates total. Summary  Carbohydrate counting is a method of keeping track of how many carbohydrates you eat.  Eating carbohydrates naturally increases the amount of sugar (glucose) in the blood.  Counting how many carbohydrates you eat helps keep your blood glucose within normal limits, which helps you manage your diabetes.  A diet and nutrition specialist (registered dietitian) can help you make a meal plan and calculate how many carbohydrates you should have at each meal and snack. This information is not intended to replace advice given to you by your health care provider. Make sure you discuss any questions you have with your health care provider. Document Revised: 06/25/2017 Document Reviewed: 05/14/2016 Elsevier Patient Education  2020 [employer].

## 2020-10-09 DIAGNOSIS — E1151 Type 2 diabetes mellitus with diabetic peripheral angiopathy without gangrene: Secondary | ICD-10-CM | POA: Diagnosis not present

## 2020-10-09 DIAGNOSIS — E1140 Type 2 diabetes mellitus with diabetic neuropathy, unspecified: Secondary | ICD-10-CM | POA: Diagnosis not present

## 2020-10-16 ENCOUNTER — Other Ambulatory Visit: Payer: Self-pay | Admitting: *Deleted

## 2020-10-16 NOTE — Patient Outreach (Signed)
[facility]) Care Management  10/16/2020  [name] [name] Oct 02, 1941 [id number]  Unsuccessful outreach attempt made to patient. RN Health Coach left HIPAA compliant voicemail message along with her contact information.  Plan: RN Health Coach will call patient within the next several business days and will send an unsuccessful letter.   Blanchie Serve RN, BSN [facility] Care Management  RN Health Coach [phone] [email or web address]

## 2020-10-22 ENCOUNTER — Other Ambulatory Visit: Payer: Self-pay

## 2020-10-22 MED ORDER — LINAGLIPTIN 5 MG PO TABS: 5.0000 mg | ORAL_TABLET | Freq: Every day | ORAL | 1 refills | Status: DC

## 2020-10-24 ENCOUNTER — Other Ambulatory Visit: Payer: Self-pay | Admitting: *Deleted

## 2020-10-24 NOTE — Patient Outreach (Signed)
[facility]) Care Management  10/24/2020  CHYNA KNEECE 1941/08/15 [id number]  Unsuccessful outreach attempt made to patient. RN Health Coach left HIPAA compliant voicemail message along with her contact information.  Plan: RN Health Coach will call patient within the next several business days.    Blanchie Serve RN, BSN [facility] Care Management  RN Health Coach [phone] [email or web address]

## 2020-10-26 ENCOUNTER — Other Ambulatory Visit: Payer: Self-pay | Admitting: *Deleted

## 2020-10-26 NOTE — Patient Outreach (Signed)
[facility]) Care Management  [facility] Care Manager  10/26/2020   [name] [name] [birth year] [id number]  Subjective: Successful telephone outreach call to patient. HIPAA identifiers obtained. Nurse spoke with patient about [facility] program of which she did participate in previously. Patient was not sure if she wanted to participate again. Patient explained that she is following what her doctor's are telling her to do and that she is doing "pretty good" at this time. Patient explained that she does have an appointment with her PCP on 11/27/20 and would like to for this nurse to call her back after her appointment. Patient wants to discuss her participation with [facility] with her provider.  Nurse stated that she does work along with the patient's PCP to help her maintain and improve her health and that she would call her back after she has her up coming PCP visit.   Encounter Medications:  Outpatient Encounter Medications as of 10/26/2020  Medication Sig Note  . aspirin EC 325 MG EC tablet Take 1 tablet (325 mg total) by mouth daily.   Marland Kitchen atorvastatin (LIPITOR) 40 MG tablet Take 1 tablet (40 mg total) by mouth daily.   . Biotin w/ Vitamins C & E (HAIR SKIN & NAILS GUMMIES PO) Take 2 each by mouth every morning.   . Cetirizine HCl 10 MG CAPS Take by mouth.   . fish oil-omega-3 fatty acids 1000 MG capsule Take 1 g by mouth 2 (two) times daily.   Marland Kitchen linagliptin (TRADJENTA) 5 MG TABS tablet Take 1 tablet (5 mg total) by mouth daily.   . metoprolol tartrate (LOPRESSOR) 25 MG tablet Take 0.5 tablets (12.5 mg total) by mouth 2 (two) times daily.   . Multiple Vitamin (MULITIVITAMIN WITH MINERALS) TABS Take 1 tablet by mouth daily.   . Multiple Vitamins-Minerals (AIRBORNE GUMMIES PO) Take 1 each by mouth every morning.   . nitroGLYCERIN (NITROSTAT) 0.4 MG SL tablet Place 1 tablet (0.4 mg total) under the tongue every 5 (five) minutes x 3 doses as needed for chest pain (if no relief after 3rd dose,  proceed to the ED for an evaluation or call 911). 09/27/2020: Patient states has not needed.   . traMADol (ULTRAM) 50 MG tablet Take by mouth every 6 (six) hours as needed.  09/27/2020: Patient states has not needed.    No facility-administered encounter medications on file as of 10/26/2020.    Functional Status:  In your present state of health, do you have any difficulty performing the following activities: 07/04/2020 05/18/2020  Hearing? N N  Vision? N N  Difficulty concentrating or making decisions? N N  Walking or climbing stairs? N Y  Comment Per patient follows mobility restrictions. -  Dressing or bathing? N N  Comment Per patient follows mobility restrictions. -  Doing errands, shopping? Y N  Comment Per patient follows mobility restrictions. -  Preparing Food and eating ? N -  Using the Toilet? N -  In the past six months, have you accidently leaked urine? N -  Do you have problems with loss of bowel control? N -  Managing your Medications? N -  Managing your Finances? N -  Housekeeping or managing your Housekeeping? Y -  Some recent data might be hidden    Fall/Depression Screening: Fall Risk  10/03/2020 07/26/2020 07/04/2020  Falls in the past year? 0 0 0  Comment - - -  Number falls in past yr: 0 0 0  Comment - - -  Injury with  Fall? 0 0 0  Risk for fall due to : No Fall Risks No Fall Risks Other (Comment)  Risk for fall due to: Comment - - Risk due to recent surgery per patient  Follow up Falls evaluation completed Falls evaluation completed Falls evaluation completed;Education provided;Falls prevention discussed   PHQ 2/9 Scores 10/03/2020 07/26/2020 07/04/2020 06/27/2020 03/07/2020  PHQ - 2 Score 2 0 0 1 1  PHQ- 9 Score 2 0 - - -    Assessment:  Goals Addressed   None    Plan: RN Health Coach will call patient after her PCP visit in December.  Blanchie Serve RN, BSN [facility] Care Management  RN Health Coach [phone] [name]

## 2020-10-29 ENCOUNTER — Other Ambulatory Visit: Payer: Self-pay | Admitting: Orthopedic Surgery

## 2020-10-29 DIAGNOSIS — G8929 Other chronic pain: Secondary | ICD-10-CM

## 2020-10-29 NOTE — Progress Notes (Signed)
Orders placed for x-ray at [facility]. Patient will come to the office after the x-rays at [facility].

## 2020-10-30 ENCOUNTER — Other Ambulatory Visit: Payer: Self-pay | Admitting: Orthopedic Surgery

## 2020-10-30 ENCOUNTER — Encounter: Payer: Self-pay | Admitting: Orthopedic Surgery

## 2020-10-30 ENCOUNTER — Ambulatory Visit (HOSPITAL_COMMUNITY)
Admission: RE | Admit: 2020-10-30 | Discharge: 2020-10-30 | Disposition: A | Discharge status: Home / Self Care | Payer: Medicare Other | Source: Ambulatory Visit | Attending: Orthopedic Surgery | Admitting: Orthopedic Surgery

## 2020-10-30 ENCOUNTER — Other Ambulatory Visit: Payer: Self-pay

## 2020-10-30 ENCOUNTER — Ambulatory Visit (INDEPENDENT_AMBULATORY_CARE_PROVIDER_SITE_OTHER): Payer: Medicare Other | Admitting: Orthopedic Surgery

## 2020-10-30 VITALS — BP 139/74 | HR 96 | Ht 59.0 in | Wt 158.0 lb

## 2020-10-30 DIAGNOSIS — M25512 Pain in left shoulder: Secondary | ICD-10-CM | POA: Insufficient documentation

## 2020-10-30 DIAGNOSIS — G8929 Other chronic pain: Secondary | ICD-10-CM | POA: Insufficient documentation

## 2020-10-30 DIAGNOSIS — M79642 Pain in left hand: Secondary | ICD-10-CM | POA: Diagnosis not present

## 2020-10-30 DIAGNOSIS — Z951 Presence of aortocoronary bypass graft: Secondary | ICD-10-CM | POA: Diagnosis not present

## 2020-10-30 DIAGNOSIS — M85812 Other specified disorders of bone density and structure, left shoulder: Secondary | ICD-10-CM | POA: Diagnosis not present

## 2020-10-30 DIAGNOSIS — M792 Neuralgia and neuritis, unspecified: Secondary | ICD-10-CM | POA: Diagnosis not present

## 2020-10-30 DIAGNOSIS — S46002A Unspecified injury of muscle(s) and tendon(s) of the rotator cuff of left shoulder, initial encounter: Secondary | ICD-10-CM | POA: Diagnosis not present

## 2020-10-30 DIAGNOSIS — M19012 Primary osteoarthritis, left shoulder: Secondary | ICD-10-CM | POA: Diagnosis not present

## 2020-10-30 NOTE — Progress Notes (Signed)
New Patient Visit  Assessment: [name] [name] is a 79 y.o. RHD female with the following: 1.  Neuropathic pain of the left hand 2.  Left shoulder rotator cuff tendinopathy  Plan: [name] [name] started to have left hand pain after a recent hospitalization.  She states it started after issues with an IV.  We reviewed the x-rays in clinic today which not demonstrate a foreign body.  In addition, on physical exam, is unable to palpate any potential foreign body.  There is no area of swelling or induration that is concerning for the presence of a radiolucent foreign body.  However, she is very tender to palpation over the dorsum of her hand, with increased sensitivity to the radial and ulnar aspects of the long and ring.  It is difficult to accurately assess what is going on, but I have discussed desensitization of this area in hopes that this will continue to improve her symptoms.  She should also continue to use her hands and do the exercises that were recommended to her previously.    Regarding her left shoulder, she has significant pain, decreased range of motion and strength deficiencies.  X-rays do not show any significant arthritis, nor is there proximal humeral migration.  Nonetheless, I do feel as though she sustained an injury to her rotator cuff, which could simply be tendinitis.  I discussed the possibility of proceeding with a steroid injection at this time, but she is not interested.  This can be done at any time in the future.  I also provided her with exercises to help strengthen and increase the range of motion in her left shoulder.    All questions were answered and she is amenable to this plan.  If she does wish to proceed with a steroid injection she can contact the clinic to schedule follow-up appointment.  Follow-up: Return if symptoms worsen or fail to improve.  Subjective:  Chief Complaint  Patient presents with  . Shoulder Pain    june 3rd all started. felt a pop in shoulder  and not been the same since.  . Hand Pain    june 3rd  open heart    History of Present Illness: [name] [name] is a 79 y.o. RHD female who presents for evaluation of left hand and shoulder pain.  She was admitted to the hospital several months ago after undergoing open heart surgery.  At that time, she had an IV placed in her left hand.  She states there was some difficulty with removal and reinsertion of the IV during her hospital stay.  Since then, she has had pain in her left hand, with some radiating sensations distally.  She has been doing gentle hand exercises on a regular basis, but she continues to have difficulty.  She also notes that she has occasionally dropped things due to her inability to sufficiently grip objects.  Also while hospitalized, she was doing some simple shoulder exercises when she felt a popping sensation in the left shoulder.  Since then, she has had persistent pain and decreased range of motion.  She is very limited in the use of her left hand.   Review of Systems: No fevers or chills No shortness of breath No bowel or bladder dysfunction No GI distress No headaches   Medical History:  Past Medical History:  Diagnosis Date  . Anxiety   . CAD (coronary artery disease)    Multivessel status post CABG June 2021 - LIMA to LAD, SVG to distal  RCA, SVG to OM1  . Chronic obstructive pulmonary disease, unspecified (HCC)   . Depression   . Essential hypertension   . GERD   . History of pneumonia   . Incarcerated ventral hernia   . Osteoarthritis   . Type 2 diabetes mellitus (HCC)     Past Surgical History:  Procedure Laterality Date  . CORONARY ARTERY BYPASS GRAFT N/A 05/22/2020   Procedure: CORONARY ARTERY BYPASS GRAFTING (CABG) x3 LIMA TO LAD, SVG TO OM1, SVG TO DISTAL RIGHT;  Surgeon: Corliss Skains, MD;  Location: [facility] OR;  Service: Open Heart Surgery;  Laterality: N/A;  . LEFT HEART CATH AND CORONARY ANGIOGRAPHY N/A 05/21/2020   Procedure: LEFT HEART  CATH AND CORONARY ANGIOGRAPHY;  Surgeon: [place], Peter M, MD;  Location: [facility] INVASIVE CV LAB;  Service: Cardiovascular;  Laterality: N/A;  . NEPHRECTOMY    . TEE WITHOUT CARDIOVERSION N/A 05/22/2020   Procedure: TRANSESOPHAGEAL ECHOCARDIOGRAM (TEE);  Surgeon: Corliss Skains, MD;  Location: [facility] OR;  Service: Open Heart Surgery;  Laterality: N/A;  . VEIN HARVEST Right 05/22/2020   Procedure: Open Vein Harvest;  Surgeon: Corliss Skains, MD;  Location: [facility] OR;  Service: Open Heart Surgery;  Laterality: Right;  . VENTRAL HERNIA REPAIR  09/01/2017   mistaken entry    Family History  Problem Relation Age of Onset  . CAD Father   . Heart failure Sister    Social History   Tobacco Use  . Smoking status: Never Smoker  . Smokeless tobacco: Never Used  Vaping Use  . Vaping Use: Never used  Substance Use Topics  . Alcohol use: No  . Drug use: No    Allergies  Allergen Reactions  . Contrast Media [Iodinated Diagnostic Agents]     Unknown reaction. Due to kidney issues. Do not use  . Codeine Rash  . Morphine And Related   . Other     Pt has multiple allergies per pt but unsure of all.   . Penicillins     Unknown reaction    Current Meds  Medication Sig  . aspirin EC 325 MG EC tablet Take 1 tablet (325 mg total) by mouth daily.  Marland Kitchen atorvastatin (LIPITOR) 40 MG tablet Take 1 tablet (40 mg total) by mouth daily.  . Biotin w/ Vitamins C & E (HAIR SKIN & NAILS GUMMIES PO) Take 2 each by mouth every morning.  . Cetirizine HCl 10 MG CAPS Take by mouth.  . fish oil-omega-3 fatty acids 1000 MG capsule Take 1 g by mouth 2 (two) times daily.  Marland Kitchen linagliptin (TRADJENTA) 5 MG TABS tablet Take 1 tablet (5 mg total) by mouth daily.  . metoprolol tartrate (LOPRESSOR) 25 MG tablet Take 0.5 tablets (12.5 mg total) by mouth 2 (two) times daily.  . Multiple Vitamin (MULITIVITAMIN WITH MINERALS) TABS Take 1 tablet by mouth daily.  . Multiple Vitamins-Minerals (AIRBORNE GUMMIES PO) Take 1 each by  mouth every morning.  . nitroGLYCERIN (NITROSTAT) 0.4 MG SL tablet Place 1 tablet (0.4 mg total) under the tongue every 5 (five) minutes x 3 doses as needed for chest pain (if no relief after 3rd dose, proceed to the ED for an evaluation or call 911).  . traMADol (ULTRAM) 50 MG tablet Take by mouth every 6 (six) hours as needed.     Objective: BP 139/74   Pulse 96   Ht 4\' 11"  (1.499 m)   Wt 158 lb (71.7 kg)   BMI 31.91 kg/m   Physical Exam:  General: elderly female, no acute distress Gait: Slow, steady gait  Evaluation of the left hand demonstrates tenderness palpation over the dorsal aspect of the hand.  There is no swelling in this area.  There is no induration or fluctuance appreciated.  Negative Tinel's in this area.  Discomfort is replicated with palpation.  She reports an odd sensation to light touch in the ulnar aspect of the long finger, radial and ulnar ring finger, as well as the radial small finger.  4/5 grip strength due to the discomfort.  Evaluation of left shoulder demonstrates no obvious deformity.  Forward flexion is limited to 100 degrees of active motion, with 130 degrees passive.  Positive Hawkins testing.  Positive impingement testing.  Negative belly press.  Supraspinatus and infraspinatus is 4/5.   IMAGING: I personally ordered and reviewed the following images  XR left shoulder without significant arthritis, no proximal humeral migration.  XR left hand degenerative changes at DIP joints to all fingers as well as loss of joint space with mild subluxation at the thumb CMC joint.  No foreign body is identified.  New Medications:  No orders of the defined types were placed in this encounter.     Oliver Barre, MD  10/30/2020 12:50 PM

## 2020-10-30 NOTE — Progress Notes (Signed)
Patient wants orders for hand xray and per Dr. Dallas Schimke ok to place order.

## 2020-10-30 NOTE — Patient Instructions (Signed)
1.  For the pain in your left hand, continue to rub the most sensitive area.  If you have an irritated nerve, this can help relieve the pain over time 2.  For your left shoulder, you have probably injured or irritated the tendons to your rotator cuff.  We cannot see the tendons on XR.  If you would like to try a steroid injection in your shoulder, please call to schedule an appointment. 3.  I have provided you with some shoulder exercises to try on your own.  You can use heat to warm up your shoulder and ice to help with some pain.  4.  Please call if you would like to see a physical therapist, or schedule a follow up appointment.

## 2020-11-21 ENCOUNTER — Other Ambulatory Visit: Payer: Self-pay | Admitting: Family Medicine

## 2020-11-26 ENCOUNTER — Telehealth: Payer: Self-pay | Admitting: Family Medicine

## 2020-11-26 NOTE — Telephone Encounter (Signed)
Pt daughter informed pt can take her medications with water. Pt daughter would like sugar checked at visit since her sugars have been very elevated.

## 2020-11-26 NOTE — Telephone Encounter (Signed)
Would like for the nurse to give her call. Has appt tomorrow morning and would like her blood sugar check. Please contact daughter Leroy Libman and leave a message unsure about taking her meds in the morning before check blood sugar. Daughter cindi # [phone]

## 2020-11-27 ENCOUNTER — Other Ambulatory Visit: Payer: Self-pay

## 2020-11-27 ENCOUNTER — Encounter: Payer: Self-pay | Admitting: Family Medicine

## 2020-11-27 ENCOUNTER — Ambulatory Visit (INDEPENDENT_AMBULATORY_CARE_PROVIDER_SITE_OTHER): Payer: Medicare Other | Admitting: Family Medicine

## 2020-11-27 VITALS — BP 134/70 | HR 76 | Temp 97.1°F | Ht 59.0 in | Wt 157.0 lb

## 2020-11-27 DIAGNOSIS — E1121 Type 2 diabetes mellitus with diabetic nephropathy: Secondary | ICD-10-CM | POA: Diagnosis not present

## 2020-11-27 DIAGNOSIS — R140 Abdominal distension (gaseous): Secondary | ICD-10-CM

## 2020-11-27 DIAGNOSIS — I119 Hypertensive heart disease without heart failure: Secondary | ICD-10-CM | POA: Diagnosis not present

## 2020-11-27 DIAGNOSIS — R1900 Intra-abdominal and pelvic swelling, mass and lump, unspecified site: Secondary | ICD-10-CM | POA: Diagnosis not present

## 2020-11-27 DIAGNOSIS — K439 Ventral hernia without obstruction or gangrene: Secondary | ICD-10-CM | POA: Diagnosis not present

## 2020-11-27 DIAGNOSIS — R1909 Other intra-abdominal and pelvic swelling, mass and lump: Secondary | ICD-10-CM

## 2020-11-27 DIAGNOSIS — N1832 Chronic kidney disease, stage 3b: Secondary | ICD-10-CM

## 2020-11-27 DIAGNOSIS — R1904 Left lower quadrant abdominal swelling, mass and lump: Secondary | ICD-10-CM

## 2020-11-27 DIAGNOSIS — E782 Mixed hyperlipidemia: Secondary | ICD-10-CM | POA: Insufficient documentation

## 2020-11-27 LAB — POCT GLYCOSYLATED HEMOGLOBIN (HGB A1C): Hemoglobin A1C: 11.5 % — AB (ref 4.0–5.6)

## 2020-11-27 NOTE — Assessment & Plan Note (Addendum)
A1c is up to 11.5% she is willing for have me adjust her medications once we get her kidney function back. She would benefit from an injection either weekly or daily.

## 2020-11-27 NOTE — Assessment & Plan Note (Signed)
Concerned given the shift of her hernia in her abdomen.  CT scan ordered. As she reports some pains with this.

## 2020-11-27 NOTE — Assessment & Plan Note (Signed)
CT scan ordered today  

## 2020-11-27 NOTE — Telephone Encounter (Signed)
Can we please make sure we check a CBG on her today- I will be ordering labs as well to verify this elevation that is suspected.

## 2020-11-27 NOTE — Assessment & Plan Note (Signed)
Updated labs ordered today. Encouraged control of DM and HTN

## 2020-11-27 NOTE — Assessment & Plan Note (Signed)
CT scan ordered today

## 2020-11-27 NOTE — Progress Notes (Signed)
Subjective:  Patient ID: [name] [name], female    DOB: [birth year]  Age: 79 y.o. MRN: [id number]  CC:  Chief Complaint  Patient presents with  . Diabetes    F/u      HPI  HPI   [name] [name] presents today for follow up in diabetes, however she also has a concern about her hernia or abdomen. She has a history that includes but is not limited to hypertension, heart disease recent CABG procedure back in June, diabetes, renal changes.  DM: She reports doing overall okay with this until recently when she started noting and increase in her fasting levels the range is 230-375. She has been on tradjenta since I met her. She was a previous patient of Dr [name] [name]. She reports eating some sweet usually in the morning, but not off her diet enough to cause this big of a jump- per her. She reports that after her surgeries this year- the dr's would tell her it was medication they gave her causing the increase in blood sugar. But she is not thinking that is the same for now.   Hernia/ vs growth: Her left abdomen area has an increased mass questionable hernia that she reports has rotated from the right side to the left since sx. She also has some growths on the left flank close and over the area she had kidney removed.  She reports discomfort and at times, feels like the front hernia or mass seems like it can be larger. She is willing to have me order a repeat CT scan and refer to General surgery for follow up on these areas.   She denies having chest pain, shortness of breath, cough, dizziness, headaches, or changes in bladder or bowel habits. She does reports some intermittent vaginal bleeding that she has she has had for years- and no one ever said anything to her about it.    Today patient denies signs and symptoms of COVID 19 infection including fever, chills, cough, shortness of breath, and headache. Past Medical, Surgical, Social History, Allergies, and Medications have been Reviewed.   Past  Medical History:  Diagnosis Date  . Anxiety   . CAD (coronary artery disease)    Multivessel status post CABG June 2021 - LIMA to LAD, SVG to distal RCA, SVG to OM1  . Chronic obstructive pulmonary disease, unspecified ([place])   . Depression   . Essential hypertension   . GERD   . History of pneumonia   . Incarcerated ventral hernia   . Osteoarthritis   . Type 2 diabetes mellitus (HCC)     Current Meds  Medication Sig  . aspirin EC 325 MG EC tablet Take 1 tablet (325 mg total) by mouth daily.  Marland Kitchen atorvastatin (LIPITOR) 40 MG tablet TAKE 1 TABLET BY MOUTH AT BEDTIME.  Marland Kitchen Biotin w/ Vitamins C & E (HAIR SKIN & NAILS GUMMIES PO) Take 2 each by mouth every morning.  . Cetirizine HCl 10 MG CAPS Take by mouth.  . fish oil-omega-3 fatty acids 1000 MG capsule Take 1 g by mouth 2 (two) times daily.  Marland Kitchen linagliptin (TRADJENTA) 5 MG TABS tablet Take 1 tablet (5 mg total) by mouth daily.  . metoprolol tartrate (LOPRESSOR) 25 MG tablet TAKE (1/2) TABLET BY MOUTH 2 TIMES A DAY.  . Multiple Vitamin (MULITIVITAMIN WITH MINERALS) TABS Take 1 tablet by mouth daily.  . Multiple Vitamins-Minerals (AIRBORNE GUMMIES PO) Take 1 each by mouth every morning.  . nitroGLYCERIN (NITROSTAT) 0.4  MG SL tablet Place 1 tablet (0.4 mg total) under the tongue every 5 (five) minutes x 3 doses as needed for chest pain (if no relief after 3rd dose, proceed to the ED for an evaluation or call 911).  . traMADol (ULTRAM) 50 MG tablet Take by mouth every 6 (six) hours as needed.     ROS:  Review of Systems  HENT: Negative.   Eyes: Negative.   Respiratory: Negative.   Cardiovascular: Negative.   Gastrointestinal: Positive for abdominal pain.  Genitourinary: Negative.   Musculoskeletal: Negative.   Skin: Negative.   Neurological: Negative.   Endo/Heme/Allergies: Negative.        See hpi   Psychiatric/Behavioral: Negative.      Objective:   Today's Vitals: BP 134/70 (BP Location: Right Arm, Patient Position: Sitting,  Cuff Size: Normal)   Pulse 76   Temp (!) 97.1 F (36.2 C) (Temporal)   Ht 4' 11"  (1.499 m)   Wt 157 lb (71.2 kg)   SpO2 97%   BMI 31.71 kg/m  Vitals with BMI 11/27/2020 10/30/2020 10/03/2020  Height 4' 11"  4' 11"  4' 11"   Weight 157 lbs 158 lbs 156 lbs  BMI 31.69 58.52 77.82  Systolic 423 536 144  Diastolic 70 74 64  Pulse 76 96 -     Physical Exam Vitals and nursing note reviewed.  Constitutional:      Appearance: Normal appearance. She is well-developed and well-groomed. She is obese.  HENT:     Head: Normocephalic and atraumatic.     Right Ear: External ear normal.     Left Ear: External ear normal.     Nose: Nose normal.     Mouth/Throat:     Mouth: Mucous membranes are moist.     Pharynx: Oropharynx is clear.  Eyes:     General:        Right eye: No discharge.        Left eye: No discharge.     Conjunctiva/sclera: Conjunctivae normal.  Cardiovascular:     Rate and Rhythm: Normal rate and regular rhythm.     Pulses: Normal pulses.     Heart sounds: Normal heart sounds.  Pulmonary:     Effort: Pulmonary effort is normal.     Breath sounds: Normal breath sounds.  Musculoskeletal:        General: Normal range of motion.     Cervical back: Normal range of motion and neck supple.  Skin:    General: Skin is warm.  Neurological:     General: No focal deficit present.     Mental Status: She is alert and oriented to person, place, and time.  Psychiatric:        Attention and Perception: Attention normal.        Mood and Affect: Mood normal.        Speech: Speech normal.        Behavior: Behavior normal. Behavior is cooperative.        Thought Content: Thought content normal.        Cognition and Memory: Cognition normal.        Judgment: Judgment normal.      Assessment   1. Type 2 diabetes mellitus with diabetic nephropathy, unspecified whether long term insulin use ([place])   2. Chronic kidney disease, stage 3b ([place])   3. Hypertensive heart disease without  heart failure   4. Left lower quadrant abdominal mass   5. Abdominal wall mass of left flank  6. Ventral hernia without obstruction or gangrene     Tests ordered Orders Placed This Encounter  Procedures  . Comprehensive metabolic panel  . CBC  . POCT glycosylated hemoglobin (Hb A1C)     Plan: Please see assessment and plan per problem list above.   No orders of the defined types were placed in this encounter.   Patient to follow-up in 12/25/2020    Perlie Mayo, NP

## 2020-11-27 NOTE — Telephone Encounter (Signed)
Pt here today for appt.

## 2020-11-27 NOTE — Addendum Note (Signed)
Addended by: Jerilynn Mages on: 11/27/2020 02:43 PM   Modules accepted: Orders

## 2020-11-27 NOTE — Patient Instructions (Addendum)
°  I appreciate the opportunity to provide you with care for your health and wellness. Today we discussed: diabetes and growth on belly and side   Follow up: 4-5 weeks with blood sugar readings   Labs today- for kidney function and blood levels Referrals today- Dr Henreitta Leber with surgery   A1c is very elevated at 11.5% your diabetes not well controlled.  I will adjust your medications once I see what your kidney function is doing. There is a good chance you will need insulin or an injection medication to get your diabetes back under control  CT scan ordered today to assess the changes in hernia- growths   Please continue to practice social distancing to keep you, your family, and our community safe.  If you must go out, please wear a mask and practice good handwashing.  It was a pleasure to see you and I look forward to continuing to work together on your health and well-being. Please do not hesitate to call the office if you need care or have questions about your care.  Have a wonderful day. With Gratitude, Tereasa Coop, DNP, AGNP-BC

## 2020-11-28 ENCOUNTER — Emergency Department (HOSPITAL_COMMUNITY): Payer: Medicare Other

## 2020-11-28 ENCOUNTER — Other Ambulatory Visit: Payer: Self-pay

## 2020-11-28 ENCOUNTER — Ambulatory Visit (HOSPITAL_COMMUNITY)
Admission: RE | Admit: 2020-11-28 | Discharge: 2020-11-28 | Disposition: A | Discharge status: Home / Self Care | Payer: Medicare Other | Source: Ambulatory Visit | Attending: Family Medicine | Admitting: Family Medicine

## 2020-11-28 ENCOUNTER — Emergency Department (HOSPITAL_COMMUNITY)
Admission: EM | Admit: 2020-11-28 | Discharge: 2020-11-28 | Disposition: A | Discharge status: Home / Self Care | Payer: Medicare Other | Attending: Emergency Medicine | Admitting: Emergency Medicine

## 2020-11-28 ENCOUNTER — Telehealth: Payer: Self-pay

## 2020-11-28 ENCOUNTER — Encounter (HOSPITAL_COMMUNITY): Payer: Self-pay | Admitting: Emergency Medicine

## 2020-11-28 DIAGNOSIS — I509 Heart failure, unspecified: Secondary | ICD-10-CM | POA: Insufficient documentation

## 2020-11-28 DIAGNOSIS — I110 Hypertensive heart disease with heart failure: Secondary | ICD-10-CM | POA: Insufficient documentation

## 2020-11-28 DIAGNOSIS — R1900 Intra-abdominal and pelvic swelling, mass and lump, unspecified site: Secondary | ICD-10-CM

## 2020-11-28 DIAGNOSIS — M79605 Pain in left leg: Secondary | ICD-10-CM | POA: Insufficient documentation

## 2020-11-28 DIAGNOSIS — N3289 Other specified disorders of bladder: Secondary | ICD-10-CM | POA: Diagnosis not present

## 2020-11-28 DIAGNOSIS — R1904 Left lower quadrant abdominal swelling, mass and lump: Secondary | ICD-10-CM

## 2020-11-28 DIAGNOSIS — E119 Type 2 diabetes mellitus without complications: Secondary | ICD-10-CM | POA: Diagnosis not present

## 2020-11-28 DIAGNOSIS — N1832 Chronic kidney disease, stage 3b: Secondary | ICD-10-CM | POA: Insufficient documentation

## 2020-11-28 DIAGNOSIS — Z951 Presence of aortocoronary bypass graft: Secondary | ICD-10-CM | POA: Insufficient documentation

## 2020-11-28 DIAGNOSIS — K439 Ventral hernia without obstruction or gangrene: Secondary | ICD-10-CM

## 2020-11-28 DIAGNOSIS — J449 Chronic obstructive pulmonary disease, unspecified: Secondary | ICD-10-CM | POA: Insufficient documentation

## 2020-11-28 DIAGNOSIS — I129 Hypertensive chronic kidney disease with stage 1 through stage 4 chronic kidney disease, or unspecified chronic kidney disease: Secondary | ICD-10-CM | POA: Insufficient documentation

## 2020-11-28 DIAGNOSIS — R140 Abdominal distension (gaseous): Secondary | ICD-10-CM | POA: Diagnosis not present

## 2020-11-28 DIAGNOSIS — M7732 Calcaneal spur, left foot: Secondary | ICD-10-CM | POA: Diagnosis not present

## 2020-11-28 DIAGNOSIS — I2510 Atherosclerotic heart disease of native coronary artery without angina pectoris: Secondary | ICD-10-CM | POA: Insufficient documentation

## 2020-11-28 DIAGNOSIS — E1121 Type 2 diabetes mellitus with diabetic nephropathy: Secondary | ICD-10-CM

## 2020-11-28 DIAGNOSIS — I878 Other specified disorders of veins: Secondary | ICD-10-CM | POA: Diagnosis not present

## 2020-11-28 LAB — COMPREHENSIVE METABOLIC PANEL
ALT: 55 IU/L — ABNORMAL HIGH (ref 0–32)
AST: 31 IU/L (ref 0–40)
Albumin/Globulin Ratio: 1.5 (ref 1.2–2.2)
Albumin: 4.4 g/dL (ref 3.7–4.7)
Alkaline Phosphatase: 88 IU/L (ref 44–121)
BUN/Creatinine Ratio: 16 (ref 12–28)
BUN: 19 mg/dL (ref 8–27)
Bilirubin Total: 0.4 mg/dL (ref 0.0–1.2)
CO2: 25 mmol/L (ref 20–29)
Calcium: 9.4 mg/dL (ref 8.7–10.3)
Chloride: 98 mmol/L (ref 96–106)
Creatinine, Ser: 1.2 mg/dL — ABNORMAL HIGH (ref 0.57–1.00)
GFR calc Af Amer: 50 mL/min/{1.73_m2} — ABNORMAL LOW (ref >59)
GFR calc non Af Amer: 43 mL/min/{1.73_m2} — ABNORMAL LOW (ref >59)
Globulin, Total: 3 g/dL (ref 1.5–4.5)
Glucose: 300 mg/dL — ABNORMAL HIGH (ref 65–99)
Potassium: 4.9 mmol/L (ref 3.5–5.2)
Sodium: 136 mmol/L (ref 134–144)
Total Protein: 7.4 g/dL (ref 6.0–8.5)

## 2020-11-28 LAB — CBC
Hematocrit: 42.4 % (ref 34.0–46.6)
Hemoglobin: 14.2 g/dL (ref 11.1–15.9)
MCH: 31.1 pg (ref 26.6–33.0)
MCHC: 33.5 g/dL (ref 31.5–35.7)
MCV: 93 fL (ref 79–97)
Platelets: 273 10*3/uL (ref 150–450)
RBC: 4.56 x10E6/uL (ref 3.77–5.28)
RDW: 12.1 % (ref 11.7–15.4)
WBC: 9.2 10*3/uL (ref 3.4–10.8)

## 2020-11-28 MED ORDER — ENOXAPARIN SODIUM 100 MG/ML ~~LOC~~ SOLN
100.0000 mg | SUBCUTANEOUS | Status: DC
  Administered 2020-11-28: 22:00:00 100 mg via SUBCUTANEOUS
  Filled 2020-11-28: qty 1

## 2020-11-28 MED ORDER — BLOOD GLUCOSE METER KIT: PACK | 0 refills | Status: DC

## 2020-11-28 NOTE — ED Provider Notes (Signed)
Emergency Department Provider Note   I have reviewed the triage vital signs and the nursing notes.   HISTORY  Chief Complaint Leg Pain   HPI TEMPERANCE KELEMEN is a 79 y.o. female with past medical history reviewed below presents to the emergency department with atraumatic left leg pain.  Pain symptoms began today.  Patient recently followed with her primary care doctor and went for outpatient CT scan.  She mention the leg pain there and was ultimately referred to the emergency department to rule out DVT.  She is not experiencing any chest pain or shortness of breath.  She takes a daily, 325 mg aspirin, but is not otherwise anticoagulated.  She has no history of intracranial hemorrhage, severe GI bleeding, or other absolute contraindication to anticoagulation.  Pain is radiating from the lower calf to behind the left knee.  The patient's family is with her and feel like the leg is slightly swollen.  No falls or known injury.   Past Medical History:  Diagnosis Date  . Anxiety   . CAD (coronary artery disease)    Multivessel status post CABG June 2021 - LIMA to LAD, SVG to distal RCA, SVG to OM1  . Chronic obstructive pulmonary disease, unspecified (HCC)   . Depression   . Essential hypertension   . GERD   . History of pneumonia   . Incarcerated ventral hernia   . Osteoarthritis   . Type 2 diabetes mellitus [facility])     Patient Active Problem List   Diagnosis Date Noted  . Mixed hyperlipidemia 11/27/2020  . Left lower quadrant abdominal mass 11/27/2020  . Abdominal wall mass of left flank 11/27/2020  . Acute pain of left shoulder 10/03/2020  . Joint stiffness of hand, left 10/03/2020  . Gastro-esophageal reflux disease with esophagitis   . Major depressive disorder, single episode, unspecified   . Unspecified osteoarthritis, unspecified site   . Atherosclerotic heart disease of native coronary artery without angina pectoris   . Chronic kidney disease, stage 3b (HCC)   .  Hypertensive heart disease without heart failure   . Type 2 diabetes mellitus with diabetic nephropathy (HCC)   . Ventral hernia without obstruction or gangrene 09/01/2017    Past Surgical History:  Procedure Laterality Date  . CORONARY ARTERY BYPASS GRAFT N/A 05/22/2020   Procedure: CORONARY ARTERY BYPASS GRAFTING (CABG) x3 LIMA TO LAD, SVG TO OM1, SVG TO DISTAL RIGHT;  Surgeon: Corliss Skains, MD;  Location: [facility] OR;  Service: Open Heart Surgery;  Laterality: N/A;  . LEFT HEART CATH AND CORONARY ANGIOGRAPHY N/A 05/21/2020   Procedure: LEFT HEART CATH AND CORONARY ANGIOGRAPHY;  Surgeon: Swaziland, Peter M, MD;  Location: [facility] INVASIVE CV LAB;  Service: Cardiovascular;  Laterality: N/A;  . NEPHRECTOMY    . TEE WITHOUT CARDIOVERSION N/A 05/22/2020   Procedure: TRANSESOPHAGEAL ECHOCARDIOGRAM (TEE);  Surgeon: Corliss Skains, MD;  [facility] OR;  Service: Open Heart Surgery;  Laterality: N/A;  . VEIN HARVEST Right 05/22/2020   Procedure: Open Vein Harvest;  Surgeon: Corliss Skains, MD;  Location: [facility] OR;  Service: Open Heart Surgery;  Laterality: Right;  . VENTRAL HERNIA REPAIR  09/01/2017   mistaken entry    Allergies Contrast media [iodinated diagnostic agents], Codeine, Morphine and related, Other, and Penicillins  Family History  Problem Relation Age of Onset  . CAD Father   . Heart failure Sister     Social History Social History   Tobacco Use  . Smoking status: Never Smoker  .  Smokeless tobacco: Never Used  Vaping Use  . Vaping Use: Never used  Substance Use Topics  . Alcohol use: No  . Drug use: No    Review of Systems  Constitutional: No fever/chills Eyes: No visual changes. ENT: No sore throat. Cardiovascular: Denies chest pain. Respiratory: Denies shortness of breath. Gastrointestinal: No abdominal pain.  No nausea, no vomiting.  No diarrhea.  No constipation. Genitourinary: Negative for dysuria. Musculoskeletal: Negative for back pain. Positive leg pain.   Skin: Negative for rash. Neurological: Negative for headaches, focal weakness or numbness.  10-point ROS otherwise negative.  ____________________________________________   PHYSICAL EXAM:  VITAL SIGNS: ED Triage Vitals  Enc Vitals Group     BP 11/28/20 1934 (!) 155/115     Pulse Rate 11/28/20 1934 89     Resp 11/28/20 1934 18     Temp 11/28/20 1934 98.3 F (36.8 C)     Temp Source 11/28/20 1934 Oral     SpO2 11/28/20 1934 96 %     Weight 11/28/20 1935 156 lb (70.8 kg)     Height 11/28/20 1935 4\' 11"  (1.499 m)   Constitutional: Alert and oriented. Well appearing and in no acute distress. Eyes: Conjunctivae are normal. Head: Atraumatic. Nose: No congestion/rhinnorhea. Mouth/Throat: Mucous membranes are moist.  Neck: No stridor.  Cardiovascular: Normal rate, regular rhythm. Good peripheral circulation. Grossly normal heart sounds.   Respiratory: Normal respiratory effort.  Gastrointestinal: No distention.  Musculoskeletal: Tenderness to palpation over the posterior left calf.  No overlying cellulitis, bruising.  Mild swelling in comparison to the right leg.  No joint swelling, redness, warmth. Neurologic:  Normal speech and language.  Skin:  Skin is warm, dry and intact. No rash noted.  ____________________________________________  RADIOLOGY  CT Abdomen Pelvis Wo Contrast  Result Date: 11/28/2020 CLINICAL DATA:  Abdominal distension, possible mass/hernia. Remote left nephrectomy. EXAM: CT ABDOMEN AND PELVIS WITHOUT CONTRAST TECHNIQUE: Multidetector CT imaging of the abdomen and pelvis was performed following the standard protocol without IV contrast. COMPARISON:  09/19/2017 and previous FINDINGS: Lower chest: Changes of median sternotomy and CABG. Aortic and coronary calcifications. No pleural or pericardial effusion. Linear scarring or subsegmental atelectasis in both lung bases. Hepatobiliary: No focal liver abnormality is seen. No gallstones, gallbladder wall thickening,  or biliary dilatation. Pancreas: Unremarkable. No pancreatic ductal dilatation or surrounding inflammatory changes. Spleen: Normal in size without focal abnormality. Adrenals/Urinary Tract: Adrenal glands unremarkable. Left kidney surgically absent. Right kidney is ptotic, without mass or hydronephrosis. Urinary bladder incompletely distended. Stomach/Bowel: Stomach is nondistended. Small bowel nondilated, with multiple loops involved in complex left lower quadrant ventral hernia. Normal appendix. The colon is nondilated, involved in both a left posterolateral body wall hernia and the left lower quadrant complex ventral hernia. No evidence of obstruction or strangulation. Vascular/Lymphatic: Extensive aortoiliac coarse atheromatous calcifications without aneurysm. No abdominal or pelvic adenopathy. Reproductive: Uterus and bilateral adnexa are unremarkable. Other: Pelvic phleboliths.  No ascites.  No free air. Musculoskeletal: Large complex left lower quadrant ventral hernia involving loops of small bowel and colon without obstruction or strangulation. Complex left posterolateral body wall hernia involving splenic flexure and proximal descending colon, without obstruction or strangulation. Sternotomy wires. Lower thoracic and lumbar multilevel spondylitic change. No fracture or worrisome bone lesion. IMPRESSION: 1. Large complex left lower quadrant ventral hernia and lateral body wall hernia involving loops of small bowel and colon without obstruction or strangulation. 2. Coronary and aortic Atherosclerosis (ICD10-I70.0). Electronically Signed   By: 11/19/2017.D.  On: 11/28/2020 09:48   DG Ankle Complete Left  Result Date: 11/28/2020 CLINICAL DATA:  Left leg pain.  No injury. EXAM: LEFT ANKLE COMPLETE - 3+ VIEW COMPARISON:  None. FINDINGS: Plantar calcaneal spur. No acute bony abnormality. Specifically, no fracture, subluxation, or dislocation. IMPRESSION: Negative. Electronically Signed   By: Charlett Nose  M.D.   On: 11/28/2020 21:00   DG Knee Complete 4 Views Left  Result Date: 11/28/2020 CLINICAL DATA:  Left leg pain.  No injury. EXAM: LEFT KNEE - COMPLETE 4+ VIEW COMPARISON:  None. FINDINGS: No evidence of fracture, dislocation, or joint effusion. No evidence of arthropathy or other focal bone abnormality. Soft tissues are unremarkable. IMPRESSION: Negative. Electronically Signed   By: Charlett Nose M.D.   On: 11/28/2020 20:59    ____________________________________________   PROCEDURES  Procedure(s) performed:   Procedures  None  ____________________________________________   INITIAL IMPRESSION / ASSESSMENT AND PLAN / ED COURSE  Pertinent labs & imaging results that were available during my care of the patient were reviewed by me and considered in my medical decision making (see chart for details).   Patient presents to the emergency department for evaluation of atraumatic left leg pain.  She had an outpatient CT scan earlier today.  I reviewed the read here although she is not complaining of acute symptoms.  No strangulation or incarcerated hernia noted.  Imaging obtained here includes plain films of the left knee and left ankle which are unremarkable.  I have ordered an outpatient ultrasound of the left lower extremity as I do not have ultrasound coverage at this hour.  Plan to cover the patient with Lovenox overnight as she does not have an absolute contraindication to anticoagulation.  Patient to return tomorrow for ultrasound and further evaluation of DVT. No CP or SOB symptoms to suspect PE or prompt further w/u.    ____________________________________________  FINAL CLINICAL IMPRESSION(S) / ED DIAGNOSES  Final diagnoses:  Left leg pain    Note:  This document was prepared using Dragon voice recognition software and may include unintentional dictation errors.  Alona Bene, MD, [facility] Emergency Medicine    Tenzin Pavon, Arlyss Repress, MD 11/28/20 2110

## 2020-11-28 NOTE — ED Triage Notes (Signed)
Pt c/o left leg pain.

## 2020-11-28 NOTE — Progress Notes (Signed)
ANTICOAGULATION CONSULT NOTE - Initial Consult  Pharmacy Consult for Lovenox Indication: rule out DVT  Allergies  Allergen Reactions  . Contrast Media [Iodinated Diagnostic Agents]     Unknown reaction. Due to kidney issues. Do not use  . Codeine Rash  . Morphine And Related   . Other     Pt has multiple allergies per pt but unsure of all.   . Penicillins     Unknown reaction    Patient Measurements: Height: 4\' 11"  (149.9 cm) Weight: 70.8 kg (156 lb) IBW/kg (Calculated) : 43.2  Vital Signs: Temp: 98.2 F (36.8 C) (12/15 2100) Temp Source: Oral (12/15 2100) BP: 131/59 (12/15 2100) Pulse Rate: 74 (12/15 2100)  Labs: Recent Labs    11/27/20 1133  HGB 14.2  HCT 42.4  PLT 273  CREATININE 1.20*    Estimated Creatinine Clearance: 32.5 mL/min (A) (by C-G formula based on SCr of 1.2 mg/dL (H)).   Medical History: Past Medical History:  Diagnosis Date  . Anxiety   . CAD (coronary artery disease)    Multivessel status post CABG June 2021 - LIMA to LAD, SVG to distal RCA, SVG to OM1  . Chronic obstructive pulmonary disease, unspecified (HCC)   . Depression   . Essential hypertension   . GERD   . History of pneumonia   . Incarcerated ventral hernia   . Osteoarthritis   . Type 2 diabetes mellitus (HCC)     Medications:  (Not in a hospital admission)   Assessment: 61 yof who presents to the ED with left leg pain. Pharmacy consulted to begin Lovenox for rule out DVT. No AC noted PTA. No bleeding noted, CBC is normal. She has CKD, SCr 1.2, est CrCl ~42 ml/min. She will return for doppler tomorrow.  Goal of Therapy:  Full dose anticoagulation Monitor platelets by anticoagulation protocol: Yes   Plan:  Lovenox 100 mg SQ q24h (~1.5 mg/kg/day) CBC q72h Monitor for s/sx of bleeding   Thank you for involving pharmacy in this patient's care.  76, PharmD, BCPS Clinical Pharmacist Clinical phone for 11/28/2020 until 10p is [phone] 11/28/2020 9:17  PM  **Pharmacist phone directory can be found on [email or web address] listed under [facility]**

## 2020-11-28 NOTE — Telephone Encounter (Signed)
Great- thanks for follow up

## 2020-11-28 NOTE — Telephone Encounter (Signed)
We need to order test strip and meter for pt  [facility]   Place on there what ever the insurance will pay  Pt was on Janumet originally for her sugar

## 2020-11-28 NOTE — Discharge Instructions (Signed)
IMPORTANT PATIENT INSTRUCTIONS:  Your ED provider has recommended an Outpatient Ultrasound.  Please call [phone] to schedule an appointment.  If your appointment is scheduled for a Saturday, Sunday or holiday, please go to the [place] [facility] Registration Desk at least 15 minutes prior to your appointment time and tell them you are there for an ultrasound.    If your appointment is scheduled for a weekday (Monday-Friday), please go directly to the [place] [facility] Department at least 15 minutes prior to your appointment time and tell them you are there for an ultrasound.  Please call [phone] with questions. 

## 2020-11-28 NOTE — Telephone Encounter (Signed)
Rx ordered and up front to fax.

## 2020-11-28 NOTE — ED Notes (Signed)
Patient discharged to home with caregiver.  All discharge instructions reviewed.  Patient verbalized understanding via teachback method.  Wheelchair out of ED.

## 2020-11-29 ENCOUNTER — Other Ambulatory Visit: Payer: Self-pay | Admitting: *Deleted

## 2020-11-29 ENCOUNTER — Ambulatory Visit (HOSPITAL_COMMUNITY)
Admission: RE | Admit: 2020-11-29 | Discharge: 2020-11-29 | Disposition: A | Discharge status: Home / Self Care | Payer: Medicare Other | Source: Ambulatory Visit | Attending: Emergency Medicine | Admitting: Emergency Medicine

## 2020-11-29 ENCOUNTER — Other Ambulatory Visit: Payer: Self-pay | Admitting: Family Medicine

## 2020-11-29 ENCOUNTER — Other Ambulatory Visit: Payer: Self-pay

## 2020-11-29 DIAGNOSIS — N1832 Chronic kidney disease, stage 3b: Secondary | ICD-10-CM

## 2020-11-29 DIAGNOSIS — M79605 Pain in left leg: Secondary | ICD-10-CM | POA: Insufficient documentation

## 2020-11-29 DIAGNOSIS — E782 Mixed hyperlipidemia: Secondary | ICD-10-CM

## 2020-11-29 DIAGNOSIS — E1121 Type 2 diabetes mellitus with diabetic nephropathy: Secondary | ICD-10-CM

## 2020-11-29 MED ORDER — METFORMIN HCL 500 MG PO TABS: 500.0000 mg | ORAL_TABLET | Freq: Two times a day (BID) | ORAL | 3 refills | Status: DC

## 2020-11-29 NOTE — ED Provider Notes (Signed)
Patient presents for a outpatient DVT study which was ordered yesterday.  Results were reviewed,  had a negative DVT study.  Reviewed patient's chart she came in yesterday due to concerns of left lower calf pain that started suddenly yesterday with some swelling.  Imaging of knee and ankle were unremarkable.  Patient was given a shot of Lovenox and instructed come back here today for DVT study which was negative today.  Talked with patient explained to her the results and findings, explained that she should follow-up with her primary care provider for further evaluation.  Time was given to patient for questions and answers, all concerns were addressed and patient verbalized that she understood.   Carroll Sage, PA-C 11/29/20 1546    Terald Sleeper, MD 11/30/20 1125

## 2020-11-29 NOTE — Patient Outreach (Signed)
[facility] Care Management  11/29/2020  LAUNI ASENCIO 25-Feb-1941 [id number]  Successful telephone outreach call to patient. HIPAA identifiers obtained. Spoke to patient and her daughter [name]. [name] states that the patient was in the ED last night due to left leg pain and swelling. The patient was given lovenox to cover her overnight until DVT can be ruled out and an [place] order was placed with instructions for the patient to schedule the [place] and have it done today. Daughter explained that she was not able to schedule an appointment for the [place] when she called [facility] radiology this morning. Nurse placed a call to the radiology department and was able to get an appointment scheduled for the patient. Nurse called [name] back to inform her that the patient can come now for the [place]. Cindy verbalized understanding. Patient did confirm today that she would like to participate with [facility] services. Nurse did confirm that the patient/daughter has this nurse's contact number to call her if needed.   Plan: RN Health Coach will call patient within the month of January  Blanchie Serve RN, BSN [facility] Care Management  RN Health Coach [phone] [email or web address]

## 2020-12-04 ENCOUNTER — Telehealth: Payer: Self-pay

## 2020-12-04 NOTE — Telephone Encounter (Signed)
Called and was concerned about the metformin and read off the package insert I couldn't get an answer out of her if she was going to take it or not but she kept repeating all the warnings associated with it and asking if you knew about this. Told her I would assume so but let you know her concerns

## 2020-12-05 NOTE — Telephone Encounter (Signed)
I am aware of side effects. But, the medication is very well tolerated and side effects are usually short in duration if they do occur. GI upset is biggest one due to how the medication is processed in the body. The benefits of the medication are worth the try to see if she is ok with it.

## 2020-12-06 NOTE — Telephone Encounter (Signed)
Pt informed. Says she is going to start this tomorrow.

## 2020-12-06 NOTE — Telephone Encounter (Signed)
Noted thank you

## 2020-12-11 ENCOUNTER — Ambulatory Visit: Payer: Medicare Other | Admitting: General Surgery

## 2020-12-12 ENCOUNTER — Telehealth (INDEPENDENT_AMBULATORY_CARE_PROVIDER_SITE_OTHER): Payer: Medicare Other | Admitting: Family Medicine

## 2020-12-12 ENCOUNTER — Encounter: Payer: Self-pay | Admitting: Family Medicine

## 2020-12-12 ENCOUNTER — Other Ambulatory Visit: Payer: Self-pay

## 2020-12-12 VITALS — Ht 59.0 in | Wt 154.4 lb

## 2020-12-12 DIAGNOSIS — Z79899 Other long term (current) drug therapy: Secondary | ICD-10-CM | POA: Diagnosis not present

## 2020-12-12 NOTE — Assessment & Plan Note (Signed)
Extensive education done on the need for her medications such as the Metformin. Extensive review of her labs in detail over the phone.  Daughter was present.  Was able to overhear everything that was being said both her and daughter verbalized understanding.  Follow-up closely in the next couple weeks to see how she is doing on Metformin.  Side effects reviewed in detail as well.  Patient verbalized understanding and will start taking medication as of now.

## 2020-12-12 NOTE — Progress Notes (Signed)
Virtual Visit via Telephone Note   This visit type was conducted due to national recommendations for restrictions regarding the COVID-19 Pandemic (e.g. social distancing) in an effort to limit this patient's exposure and mitigate transmission in our community.  Due to her co-morbid illnesses, this patient is at least at moderate risk for complications without adequate follow up.  This format is felt to be most appropriate for this patient at this time.  The patient did not have access to video technology/had technical difficulties with video requiring transitioning to audio format only (telephone).  All issues noted in this document were discussed and addressed.  No physical exam could be performed with this format.    Evaluation Performed:  Follow-up visit  Date:  12/12/2020   ID:  [name] [name], DOB 08/17/1941, MRN [id number]  Patient Location: Home Provider Location: Office/Clinic  Participants: Nurse intake and work up; Patient and Provider for Visit and Wrap up  Method of visit: Telephone Location of Patient: Home Location of Provider: Office Consent was obtain for visit over the telephone. Services rendered by provider: Visit was performed via telephone  I verified that I am speaking with the correct person using two identifiers.  PCP:  Perlie Mayo, NP   Chief Complaint:  Med questions   History of Present Illness:    [name] [name] is a 79 y.o. female history as detailed below.  Reports today for a med question follow-up management visit.  Recently was seen in the office on December 14.  At that time labs were checked and she was found to have an A1c of 11.5%.  She was not willing to start any injections but was willing to restart Metformin.  She then read the pamphlet okay with the Metformin and decided that she did not need to restart it because she was very nervous about that.  So she presents today to discuss the need for the Metformin in detail.  The patient does  not have symptoms concerning for COVID-19 infection (fever, chills, cough, or new shortness of breath).   Past Medical, Surgical, Social History, Allergies, and Medications have been Reviewed.  Past Medical History:  Diagnosis Date  . Anxiety   . CAD (coronary artery disease)    Multivessel status post CABG June 2021 - LIMA to LAD, SVG to distal RCA, SVG to OM1  . Chronic obstructive pulmonary disease, unspecified ([place])   . Depression   . Essential hypertension   . GERD   . History of pneumonia   . Incarcerated ventral hernia   . Osteoarthritis   . Type 2 diabetes mellitus ([place])    Past Surgical History:  Procedure Laterality Date  . CORONARY ARTERY BYPASS GRAFT N/A 05/22/2020   Procedure: CORONARY ARTERY BYPASS GRAFTING (CABG) x3 LIMA TO LAD, SVG TO OM1, SVG TO DISTAL RIGHT;  Surgeon: Lajuana Matte, MD;  Location: [place];  Service: Open Heart Surgery;  Laterality: N/A;  . LEFT HEART CATH AND CORONARY ANGIOGRAPHY N/A 05/21/2020   Procedure: LEFT HEART CATH AND CORONARY ANGIOGRAPHY;  Surgeon: Martinique, Peter M, MD;  Location: [place] CV LAB;  Service: Cardiovascular;  Laterality: N/A;  . NEPHRECTOMY    . TEE WITHOUT CARDIOVERSION N/A 05/22/2020   Procedure: TRANSESOPHAGEAL ECHOCARDIOGRAM (TEE);  Surgeon: Lajuana Matte, MD;  Location: [place];  Service: Open Heart Surgery;  Laterality: N/A;  . VEIN HARVEST Right 05/22/2020   Procedure: Open Vein Harvest;  Surgeon: Lajuana Matte, MD;  Location: [facility]  OR;  Service: Open Heart Surgery;  Laterality: Right;  . VENTRAL HERNIA REPAIR  09/01/2017   mistaken entry     Current Meds  Medication Sig  . aspirin EC 325 MG EC tablet Take 1 tablet (325 mg total) by mouth daily.  Marland Kitchen atorvastatin (LIPITOR) 40 MG tablet TAKE 1 TABLET BY MOUTH AT BEDTIME.  Marland Kitchen Biotin w/ Vitamins C & E (HAIR SKIN & NAILS GUMMIES PO) Take 2 each by mouth every morning.  . blood glucose meter kit and supplies Dispense based on patient and insurance preference. Use  up to four times daily as directed. (FOR ICD-10 E10.9, E11.9).  Marland Kitchen Cetirizine HCl 10 MG CAPS Take by mouth.  . fish oil-omega-3 fatty acids 1000 MG capsule Take 1 g by mouth 2 (two) times daily.  Marland Kitchen linagliptin (TRADJENTA) 5 MG TABS tablet Take 1 tablet (5 mg total) by mouth daily.  . metoprolol tartrate (LOPRESSOR) 25 MG tablet TAKE (1/2) TABLET BY MOUTH 2 TIMES A DAY.  . Multiple Vitamin (MULITIVITAMIN WITH MINERALS) TABS Take 1 tablet by mouth daily.  . Multiple Vitamins-Minerals (AIRBORNE GUMMIES PO) Take 1 each by mouth every morning.  . traMADol (ULTRAM) 50 MG tablet Take by mouth every 6 (six) hours as needed.      Allergies:   Contrast media [iodinated diagnostic agents], Codeine, Morphine and related, Other, and Penicillins   ROS:   Please see the history of present illness.    All other systems reviewed and are negative.   Labs/Other Tests and Data Reviewed:    Recent Labs: 05/23/2020: Magnesium 2.2 11/27/2020: ALT 55; BUN 19; Creatinine, Ser 1.20; Hemoglobin 14.2; Platelets 273; Potassium 4.9; Sodium 136   Recent Lipid Panel Lab Results  Component Value Date/Time   CHOL 159 05/18/2020 03:41 AM   TRIG 286 (H) 05/18/2020 03:41 AM   HDL 29 (L) 05/18/2020 03:41 AM   CHOLHDL 5.5 05/18/2020 03:41 AM   LDLCALC 73 05/18/2020 03:41 AM   LDLCALC 109 (H) 03/07/2020 11:37 AM    Wt Readings from Last 3 Encounters:  12/12/20 154 lb 6.4 oz (70 kg)  11/28/20 156 lb (70.8 kg)  11/27/20 157 lb (71.2 kg)     Objective:    Vital Signs:  Ht 4' 11"  (1.499 m)   Wt 154 lb 6.4 oz (70 kg)   BMI 31.19 kg/m    VITAL SIGNS:  reviewed GEN:  no acute distress RESPIRATORY:  No shortness of breath in conversation PSYCH:  normal affect  ASSESSMENT & PLAN:    1. Medication management   Time:   Today, I have spent 10 minutes with the patient with telehealth technology discussing the above problems and chart review .     Medication Adjustments/Labs and Tests Ordered: Current medicines  are reviewed at length with the patient today.  Concerns regarding medicines are outlined above.   Tests Ordered: No orders of the defined types were placed in this encounter.   Medication Changes: No orders of the defined types were placed in this encounter.    Disposition:  Follow up as scheduled  Signed, Perlie Mayo, NP  12/12/2020 8:24 AM     [place]

## 2020-12-12 NOTE — Patient Instructions (Addendum)
I appreciate the opportunity to provide you with care for your health and wellness. Today we discussed: medications  Follow up: 12/25/2020 as scheduled  No labs or referrals today  Please take your medications as scheduled. We will see how you are doing at your next visit.   Please continue to practice social distancing to keep you, your family, and our community safe.  If you must go out, please wear a mask and practice good handwashing.  It was a pleasure to see you and I look forward to continuing to work together on your health and well-being. Please do not hesitate to call the office if you need care or have questions about your care.  Have a wonderful day. With Gratitude, Tereasa Coop, DNP, AGNP-BC

## 2020-12-21 ENCOUNTER — Telehealth: Payer: Self-pay

## 2020-12-21 ENCOUNTER — Other Ambulatory Visit: Payer: Self-pay

## 2020-12-21 MED ORDER — LINAGLIPTIN 5 MG PO TABS: 5.0000 mg | ORAL_TABLET | Freq: Every day | ORAL | 3 refills | Status: DC

## 2020-12-21 NOTE — Progress Notes (Signed)
Refill sent.

## 2020-12-21 NOTE — Telephone Encounter (Signed)
Patient needing a refill on tradjenta sent to [facility] pharmacy in [place] p# [phone]

## 2020-12-25 ENCOUNTER — Encounter: Payer: Self-pay | Admitting: Cardiology

## 2020-12-25 ENCOUNTER — Encounter: Payer: Self-pay | Admitting: Family Medicine

## 2020-12-25 ENCOUNTER — Other Ambulatory Visit: Payer: Self-pay

## 2020-12-25 ENCOUNTER — Telehealth (INDEPENDENT_AMBULATORY_CARE_PROVIDER_SITE_OTHER): Payer: Medicare Other | Admitting: Family Medicine

## 2020-12-25 ENCOUNTER — Ambulatory Visit (INDEPENDENT_AMBULATORY_CARE_PROVIDER_SITE_OTHER): Payer: Medicare Other | Admitting: Cardiology

## 2020-12-25 VITALS — Ht 59.0 in | Wt 158.0 lb

## 2020-12-25 VITALS — BP 108/62 | HR 91 | Resp 16 | Ht 59.0 in | Wt 160.0 lb

## 2020-12-25 DIAGNOSIS — N1832 Chronic kidney disease, stage 3b: Secondary | ICD-10-CM | POA: Diagnosis not present

## 2020-12-25 DIAGNOSIS — E1121 Type 2 diabetes mellitus with diabetic nephropathy: Secondary | ICD-10-CM | POA: Diagnosis not present

## 2020-12-25 DIAGNOSIS — E782 Mixed hyperlipidemia: Secondary | ICD-10-CM

## 2020-12-25 DIAGNOSIS — I25119 Atherosclerotic heart disease of native coronary artery with unspecified angina pectoris: Secondary | ICD-10-CM

## 2020-12-25 DIAGNOSIS — I10 Essential (primary) hypertension: Secondary | ICD-10-CM

## 2020-12-25 MED ORDER — ASPIRIN EC 81 MG PO TBEC: 81.0000 mg | DELAYED_RELEASE_TABLET | Freq: Every day | ORAL | 3 refills | Status: AC

## 2020-12-25 NOTE — Progress Notes (Signed)
Virtual Visit via Telephone Note   This visit type was conducted due to national recommendations for restrictions regarding the COVID-19 Pandemic (e.g. social distancing) in an effort to limit this patient's exposure and mitigate transmission in our community.  Due to her co-morbid illnesses, this patient is at least at moderate risk for complications without adequate follow up.  This format is felt to be most appropriate for this patient at this time.  The patient did not have access to video technology/had technical difficulties with video requiring transitioning to audio format only (telephone).  All issues noted in this document were discussed and addressed.  No physical exam could be performed with this format.    Evaluation Performed:  Follow-up visit  Date:  12/25/2020   ID:  [name] [name], DOB [birth year], MRN [id number]  Patient Location: Home Provider Location: Office/Clinic   Participants: Nurse for intake and work up; Patient and Provider for Visit and Wrap up  Method of visit: Telephone Location of Patient: Home Location of Provider: Office Consent was obtain for visit over the telephone. Services rendered by provider: Visit was performed via telephone  I verified that I am speaking with the correct person using two identifiers.  PCP:  Perlie Mayo, NP   Chief Complaint: diabetes  History of Present Illness:    [name] [name] is a 80 y.o. female with history as stated below.  She reports today for a phone visit to discuss recent med changes to her DM management. She reports blood sugars at home are running 132-229  Denies hypoglycemia.  Denies polydipsia and polyuria.  Denies non-healing wounds or rashes. Denies signs of UTI or other infections. She is working on eating better, but reports she still eats sweets, nab crackers and PB as she was told she could in the past. She drinks water, but could do better at this.   She had at first refused to start metformin  and we spend a long time discussing this during her last visit. She now reports she is doing well with this and not having side effects she thought she would have.   The patient does not have symptoms concerning for COVID-19 infection (fever, chills, cough, or new shortness of breath).   Past Medical, Surgical, Social History, Allergies, and Medications have been Reviewed.  Past Medical History:  Diagnosis Date  . Anxiety   . CAD (coronary artery disease)    Multivessel status post CABG June 2021 - LIMA to LAD, SVG to distal RCA, SVG to OM1  . Chronic obstructive pulmonary disease, unspecified ([place])   . Depression   . Essential hypertension   . GERD   . History of pneumonia   . Incarcerated ventral hernia   . Osteoarthritis   . Type 2 diabetes mellitus ([place])    Past Surgical History:  Procedure Laterality Date  . CORONARY ARTERY BYPASS GRAFT N/A 05/22/2020   Procedure: CORONARY ARTERY BYPASS GRAFTING (CABG) x3 LIMA TO LAD, SVG TO OM1, SVG TO DISTAL RIGHT;  Surgeon: Lajuana Matte, MD;  Location: [place];  Service: Open Heart Surgery;  Laterality: N/A;  . LEFT HEART CATH AND CORONARY ANGIOGRAPHY N/A 05/21/2020   Procedure: LEFT HEART CATH AND CORONARY ANGIOGRAPHY;  Surgeon: Martinique, Peter M, MD;  Location: [place] [facility];  Service: Cardiovascular;  Laterality: N/A;  . NEPHRECTOMY    . TEE WITHOUT CARDIOVERSION N/A 05/22/2020   Procedure: TRANSESOPHAGEAL ECHOCARDIOGRAM (TEE);  Surgeon: Lajuana Matte, MD;  [facility]  OR;  Service: Open Heart Surgery;  Laterality: N/A;  . VEIN HARVEST Right 05/22/2020   Procedure: Open Vein Harvest;  Surgeon: Lajuana Matte, MD;  Location: [facility];  Service: Open Heart Surgery;  Laterality: Right;  . VENTRAL HERNIA REPAIR  09/01/2017   mistaken entry     Current Meds  Medication Sig  . aspirin EC 325 MG EC tablet Take 1 tablet (325 mg total) by mouth daily.  Marland Kitchen atorvastatin (LIPITOR) 40 MG tablet TAKE 1 TABLET BY MOUTH AT BEDTIME.  Marland Kitchen  Biotin w/ Vitamins C & E (HAIR SKIN & NAILS GUMMIES PO) Take 2 each by mouth every morning.  . blood glucose meter kit and supplies Dispense based on patient and insurance preference. Use up to four times daily as directed. (FOR ICD-10 E10.9, E11.9).  Marland Kitchen Cetirizine HCl 10 MG CAPS Take by mouth.  . fish oil-omega-3 fatty acids 1000 MG capsule Take 1 g by mouth 2 (two) times daily.  Marland Kitchen linagliptin (TRADJENTA) 5 MG TABS tablet Take 1 tablet (5 mg total) by mouth daily.  . metFORMIN (GLUCOPHAGE) 500 MG tablet Take 1 tablet (500 mg total) by mouth 2 (two) times daily with a meal.  . metoprolol tartrate (LOPRESSOR) 25 MG tablet TAKE (1/2) TABLET BY MOUTH 2 TIMES A DAY.  . Multiple Vitamin (MULITIVITAMIN WITH MINERALS) TABS Take 1 tablet by mouth daily.  . Multiple Vitamins-Minerals (AIRBORNE GUMMIES PO) Take 1 each by mouth every morning.  . traMADol (ULTRAM) 50 MG tablet Take by mouth every 6 (six) hours as needed.      Allergies:   Contrast media [iodinated diagnostic agents], Codeine, Morphine and related, Other, and Penicillins   ROS:   Please see the history of present illness.    All other systems reviewed and are negative.   Labs/Other Tests and Data Reviewed:    Recent Labs: 05/23/2020: Magnesium 2.2 11/27/2020: ALT 55; BUN 19; Creatinine, Ser 1.20; Hemoglobin 14.2; Platelets 273; Potassium 4.9; Sodium 136   Recent Lipid Panel Lab Results  Component Value Date/Time   CHOL 159 05/18/2020 03:41 AM   TRIG 286 (H) 05/18/2020 03:41 AM   HDL 29 (L) 05/18/2020 03:41 AM   CHOLHDL 5.5 05/18/2020 03:41 AM   LDLCALC 73 05/18/2020 03:41 AM   LDLCALC 109 (H) 03/07/2020 11:37 AM    Wt Readings from Last 3 Encounters:  12/25/20 158 lb (71.7 kg)  12/12/20 154 lb 6.4 oz (70 kg)  11/28/20 156 lb (70.8 kg)     Objective:    Vital Signs:  Ht _0  (1.499 m)   Wt 158 lb (71.7 kg)   BMI 31.91 kg/m    VITAL SIGNS:  reviewed GEN:  no acute distress RESPIRATORY:  no shortness of breath in  conversation  PSYCH:  normal affect  ASSESSMENT & PLAN:    1. Type 2 diabetes mellitus with diabetic nephropathy, unspecified whether long term insulin use ([place])    Time:   Today, I have spent 7 minutes with the patient with telehealth technology discussing the above problems.     Medication Adjustments/Labs and Tests Ordered: Current medicines are reviewed at length with the patient today.  Concerns regarding medicines are outlined above.   Tests Ordered: No orders of the defined types were placed in this encounter.   Medication Changes: No orders of the defined types were placed in this encounter.    Disposition:  Follow up 02/27/2021 as scheduled Signed, Perlie Mayo, NP  12/25/2020 10:15 AM  [facility]

## 2020-12-25 NOTE — Assessment & Plan Note (Signed)
Next f/u will have recheck of A1c She reports now she is taking the medication as ordered If A1c is improved with this and diet we can avoid other medications or injections.

## 2020-12-25 NOTE — Patient Instructions (Signed)
Medication Instructions:  DECREASE Aspirin to 81 mg daily   *If you need a refill on your cardiac medications before your next appointment, please call your pharmacy*   Lab Work: None  If you have labs (blood work) drawn today and your tests are completely normal, you will receive your results only by: Marland Kitchen MyChart Message (if you have MyChart) OR . A paper copy in the mail If you have any lab test that is abnormal or we need to change your treatment, we will call you to review the results.   Testing/Procedures: None today   Follow-Up: At [facility], you and your health needs are our priority.  As part of our continuing mission to provide you with exceptional heart care, we have created designated Provider Care Teams.  These Care Teams include your primary Cardiologist (physician) and Advanced Practice Providers (APPs -  Physician Assistants and Nurse Practitioners) who all work together to provide you with the care you need, when you need it.  We recommend signing up for the patient portal called "MyChart".  Sign up information is provided on this After Visit Summary.  MyChart is used to connect with patients for Virtual Visits (Telemedicine).  Patients are able to view lab/test results, encounter notes, upcoming appointments, etc.  Non-urgent messages can be sent to your provider as well.   To learn more about what you can do with MyChart, go to [email or web address].    Your next appointment:   6 month(s)  The format for your next appointment:   In Person  Provider:   Nona Dell, MD   Other Instructions None      Thank you for choosing [facility] !

## 2020-12-25 NOTE — Patient Instructions (Signed)
  I appreciate the opportunity to provide you with care for your health and wellness.  Follow up: March 14th or later for A1c check in the office   No labs or referrals today  Please continue all medications, looks like blood sugar is improving, but definitely need to work on what you eat to help as well. Drink plenty of water also.   Please continue to practice social distancing to keep you, your family, and our community safe.  If you must go out, please wear a mask and practice good handwashing.  It was a pleasure to see you and I look forward to continuing to work together on your health and well-being. Please do not hesitate to call the office if you need care or have questions about your care.  Have a wonderful day. With Gratitude, Tereasa Coop, DNP, AGNP-BC

## 2020-12-25 NOTE — Progress Notes (Signed)
Cardiology Office Note  Date: 12/25/2020   ID: [name] [name], DOB 09-28-1941, MRN [id number]  PCP:  Perlie Mayo, NP  Cardiologist:  Rozann Lesches, MD Electrophysiologist:  None   Chief Complaint  Patient presents with  . Cardiac follow-up    History of Present Illness: [name] [name] is a 80 y.o. female last seen in September 2021.  She presents for a follow-up visit with her daughter.  She does not report any obvious angina symptoms, has not had to use nitroglycerin.  I reviewed her cardiac medications which are outlined below.  We discussed reducing aspirin to 81 mg daily at this time.  She has tolerated Lipitor, last LDL was 73.  Diabetic regimen has been adjusted by PCP.  I reviewed her lab work from December 2021 as noted below.  Past Medical History:  Diagnosis Date  . Anxiety   . CAD (coronary artery disease)    Multivessel status post CABG June 2021 - LIMA to LAD, SVG to distal RCA, SVG to OM1  . Chronic obstructive pulmonary disease, unspecified ([place])   . Depression   . Essential hypertension   . GERD   . History of pneumonia   . Incarcerated ventral hernia   . Osteoarthritis   . Type 2 diabetes mellitus ([place])     Past Surgical History:  Procedure Laterality Date  . CORONARY ARTERY BYPASS GRAFT N/A 05/22/2020   Procedure: CORONARY ARTERY BYPASS GRAFTING (CABG) x3 LIMA TO LAD, SVG TO OM1, SVG TO DISTAL RIGHT;  Surgeon: Lajuana Matte, MD;  Location: [place];  Service: Open Heart Surgery;  Laterality: N/A;  . LEFT HEART CATH AND CORONARY ANGIOGRAPHY N/A 05/21/2020   Procedure: LEFT HEART CATH AND CORONARY ANGIOGRAPHY;  Surgeon: Martinique, Peter M, MD;  Location: [place] [facility];  Service: Cardiovascular;  Laterality: N/A;  . NEPHRECTOMY    . TEE WITHOUT CARDIOVERSION N/A 05/22/2020   Procedure: TRANSESOPHAGEAL ECHOCARDIOGRAM (TEE);  Surgeon: Lajuana Matte, MD;  Location: [place];  Service: Open Heart Surgery;  Laterality: N/A;  . VEIN HARVEST Right  05/22/2020   Procedure: Open Vein Harvest;  Surgeon: Lajuana Matte, MD;  Location: Home;  Service: Open Heart Surgery;  Laterality: Right;  . VENTRAL HERNIA REPAIR  09/01/2017   mistaken entry    Current Outpatient Medications  Medication Sig Dispense Refill  . aspirin EC 81 MG tablet Take 1 tablet (81 mg total) by mouth daily. Swallow whole. 90 tablet 3  . atorvastatin (LIPITOR) 40 MG tablet TAKE 1 TABLET BY MOUTH AT BEDTIME. 90 tablet 1  . Biotin w/ Vitamins C & E (HAIR SKIN & NAILS GUMMIES PO) Take 2 each by mouth every morning.    . blood glucose meter kit and supplies Dispense based on patient and insurance preference. Use up to four times daily as directed. (FOR ICD-10 E10.9, E11.9). 1 each 0  . Cetirizine HCl 10 MG CAPS Take by mouth.    . fish oil-omega-3 fatty acids 1000 MG capsule Take 1 g by mouth 2 (two) times daily.    Marland Kitchen linagliptin (TRADJENTA) 5 MG TABS tablet Take 1 tablet (5 mg total) by mouth daily. 30 tablet 3  . metFORMIN (GLUCOPHAGE) 500 MG tablet Take 1 tablet (500 mg total) by mouth 2 (two) times daily with a meal. 180 tablet 3  . metoprolol tartrate (LOPRESSOR) 25 MG tablet TAKE (1/2) TABLET BY MOUTH 2 TIMES A DAY. 90 tablet 1  . Multiple Vitamin (MULITIVITAMIN WITH  MINERALS) TABS Take 1 tablet by mouth daily.    . Multiple Vitamins-Minerals (AIRBORNE GUMMIES PO) Take 1 each by mouth every morning.    . nitroGLYCERIN (NITROSTAT) 0.4 MG SL tablet Place 1 tablet (0.4 mg total) under the tongue every 5 (five) minutes x 3 doses as needed for chest pain (if no relief after 3rd dose, proceed to the ED for an evaluation or call 911). 25 tablet 3  . traMADol (ULTRAM) 50 MG tablet Take by mouth every 6 (six) hours as needed.  (Patient not taking: Reported on 12/25/2020)     No current facility-administered medications for this visit.   Allergies:  Contrast media [iodinated diagnostic agents], Codeine, Morphine and related, Other, and Penicillins   ROS: No palpitations or  syncope.  Physical Exam: VS:  BP 108/62   Pulse 91   Resp 16   Ht 4' 11"  (1.499 m)   Wt 160 lb (72.6 kg)   SpO2 98%   BMI 32.32 kg/m , BMI Body mass index is 32.32 kg/m.  Wt Readings from Last 3 Encounters:  12/25/20 160 lb (72.6 kg)  12/25/20 158 lb (71.7 kg)  12/12/20 154 lb 6.4 oz (70 kg)    General: Patient appears comfortable at rest. HEENT: Conjunctiva and lids normal, wearing a mask. Neck: Supple, no elevated JVP or carotid bruits, no thyromegaly. Lungs: Clear to auscultation, nonlabored breathing at rest. Cardiac: Regular rate and rhythm, no S3 or significant systolic murmur. Extremities: No pitting edema.  ECG:  An ECG dated 05/23/2020 was personally reviewed today and demonstrated:  Sinus rhythm with nonspecific T wave changes.  Recent Labwork: 05/23/2020: Magnesium 2.2 11/27/2020: ALT 55; AST 31; BUN 19; Creatinine, Ser 1.20; Hemoglobin 14.2; Platelets 273; Potassium 4.9; Sodium 136     Component Value Date/Time   CHOL 159 05/18/2020 0341   TRIG 286 (H) 05/18/2020 0341   HDL 29 (L) 05/18/2020 0341   CHOLHDL 5.5 05/18/2020 0341   VLDL 57 (H) 05/18/2020 0341   LDLCALC 73 05/18/2020 0341   LDLCALC 109 (H) 03/07/2020 1137    Other Studies Reviewed Today:  Cardiac catheterization 05/21/2020:  Prox LAD to Mid LAD lesion is 95% stenosed.  Ost Cx to Prox Cx lesion is 100% stenosed.  Prox RCA lesion is 90% stenosed.  Mid RCA lesion is 100% stenosed.  LV end diastolic pressure is normal.  1. Critical 3 vessel occlusive CAD.  - 95% proximal to mid LAD -100% proximal LCx. OM1 and OM2 fill by left to left collaterals - 100% mid RCA. Distal vessel fills by right to right and left to right collaterals 2. Normal LVEDP  Echocardiogram 05/18/2020: 1. Left ventricular ejection fraction, by estimation, is 55 to 60%. The  left ventricle has normal function. The left ventricle has no regional  wall motion abnormalities. There is mild left ventricular  hypertrophy.  Left ventricular diastolic parameters  are indeterminate.  2. Right ventricular systolic function is normal. The right ventricular  size is normal.  3. Left atrial size was mildly dilated.  4. The mitral valve is normal in structure. Trivial mitral valve  regurgitation. No evidence of mitral stenosis.  5. The aortic valve has an indeterminant number of cusps. Aortic valve  regurgitation is not visualized. No aortic stenosis is present.  6. The inferior vena cava is normal in size with greater than 50%  respiratory variability, suggesting right atrial pressure of 3 mmHg.   Assessment and Plan:  1. Multivessel CAD status post CABG in June 2021.  She is doing well at this time, no active angina symptoms.  Decrease aspirin to 81 mg daily, otherwise continue Lipitor, Lopressor, and as needed nitroglycerin.  2. Mixed hyperlipidemia, on Lipitor.  Last LDL 73.  No changes were made today.  3. Essential hypertension, blood pressure is well controlled today.  Medication Adjustments/Labs and Tests Ordered: Current medicines are reviewed at length with the patient today.  Concerns regarding medicines are outlined above.   Tests Ordered: No orders of the defined types were placed in this encounter.   Medication Changes: Meds ordered this encounter  Medications  . aspirin EC 81 MG tablet    Sig: Take 1 tablet (81 mg total) by mouth daily. Swallow whole.    Dispense:  90 tablet    Refill:  3    Disposition:  Follow up 6 months in the [place] office.  Signed, Satira Sark, MD, [facility] 12/25/2020 12:15 PM    [facility] at [facility] [address] [place], [place] [address] Phone: [phone]; Fax: [phone]

## 2020-12-26 LAB — COMPREHENSIVE METABOLIC PANEL
ALT: 56 IU/L — ABNORMAL HIGH (ref 0–32)
AST: 34 IU/L (ref 0–40)
Albumin/Globulin Ratio: 1.3 (ref 1.2–2.2)
Albumin: 3.9 g/dL (ref 3.7–4.7)
Alkaline Phosphatase: 75 IU/L (ref 44–121)
BUN/Creatinine Ratio: 17 (ref 12–28)
BUN: 21 mg/dL (ref 8–27)
Bilirubin Total: 0.3 mg/dL (ref 0.0–1.2)
CO2: 27 mmol/L (ref 20–29)
Calcium: 9.5 mg/dL (ref 8.7–10.3)
Chloride: 101 mmol/L (ref 96–106)
Creatinine, Ser: 1.25 mg/dL — ABNORMAL HIGH (ref 0.57–1.00)
GFR calc Af Amer: 47 mL/min/{1.73_m2} — ABNORMAL LOW (ref >59)
GFR calc non Af Amer: 41 mL/min/{1.73_m2} — ABNORMAL LOW (ref >59)
Globulin, Total: 3 g/dL (ref 1.5–4.5)
Glucose: 230 mg/dL — ABNORMAL HIGH (ref 65–99)
Potassium: 5.4 mmol/L — ABNORMAL HIGH (ref 3.5–5.2)
Sodium: 141 mmol/L (ref 134–144)
Total Protein: 6.9 g/dL (ref 6.0–8.5)

## 2020-12-27 ENCOUNTER — Other Ambulatory Visit: Payer: Self-pay | Admitting: *Deleted

## 2020-12-27 ENCOUNTER — Other Ambulatory Visit: Payer: Self-pay

## 2020-12-27 ENCOUNTER — Encounter: Payer: Self-pay | Admitting: *Deleted

## 2020-12-27 DIAGNOSIS — N1832 Chronic kidney disease, stage 3b: Secondary | ICD-10-CM

## 2020-12-28 DIAGNOSIS — N1832 Chronic kidney disease, stage 3b: Secondary | ICD-10-CM | POA: Diagnosis not present

## 2020-12-28 NOTE — Patient Outreach (Signed)
[facility]) Care Management  [place]  12/28/2020   [name] [name] 1941-05-30 [id number]  Subjective: Successful telephone outreach call to patient. HIPAA identifiers obtained. Patient reports that she is independent with all of her ADLs. She explains that she has not had any recent falls, denies any needs for DME, reports her home environment is safe. Patient does not drive and states that she is well supported by her daughter [name] [name]. Patient reports that she takes her blood sugar daily. Her ranges have been around 150 to over 200. Patient explains that she had an appointment with her PCP on 12/12/20 where NP [name] [name] and the patient agreed that she would add Metformin to her medications to help lower her A1c which elevated from 7.4 to 11.5. The patient was reluctant to begin the medication and had another appointment with PCP on 12/25/20 to discuss how she was tolerating the new medication. Patient reports tolerating the medication well. Nurse provided education regarding diabetic diet, healthy nutrition, eye, heart and kidney health with having diabetes. Patient acknowledges that her diabetes is not as well controlled as previously. Adding that she has started the new medication and she will change her diet by doing what she knows works for her. Stating that "no one knows my body better than me and I know what works for me". Patient did not have any further questions or concerns today and did confirm that the patient has this nurse's contact number to call her if needed.   Encounter Medications:  Outpatient Encounter Medications as of 12/27/2020  Medication Sig Note  . aspirin EC 81 MG tablet Take 1 tablet (81 mg total) by mouth daily. Swallow whole.   Marland Kitchen atorvastatin (LIPITOR) 40 MG tablet TAKE 1 TABLET BY MOUTH AT BEDTIME.   Marland Kitchen Biotin w/ Vitamins C & E (HAIR SKIN & NAILS GUMMIES PO) Take 2 each by mouth every morning.   . blood glucose meter kit and supplies Dispense based  on patient and insurance preference. Use up to four times daily as directed. (FOR ICD-10 E10.9, E11.9).   Marland Kitchen Cetirizine HCl 10 MG CAPS Take by mouth.   . fish oil-omega-3 fatty acids 1000 MG capsule Take 1 g by mouth 2 (two) times daily.   Marland Kitchen linagliptin (TRADJENTA) 5 MG TABS tablet Take 1 tablet (5 mg total) by mouth daily.   . metFORMIN (GLUCOPHAGE) 500 MG tablet Take 1 tablet (500 mg total) by mouth 2 (two) times daily with a meal.   . metoprolol tartrate (LOPRESSOR) 25 MG tablet TAKE (1/2) TABLET BY MOUTH 2 TIMES A DAY.   . Multiple Vitamin (MULITIVITAMIN WITH MINERALS) TABS Take 1 tablet by mouth daily.   . Multiple Vitamins-Minerals (AIRBORNE GUMMIES PO) Take 1 each by mouth every morning.   . nitroGLYCERIN (NITROSTAT) 0.4 MG SL tablet Place 1 tablet (0.4 mg total) under the tongue every 5 (five) minutes x 3 doses as needed for chest pain (if no relief after 3rd dose, proceed to the ED for an evaluation or call 911). 12/28/2020: Has not needed  . traMADol (ULTRAM) 50 MG tablet Take by mouth every 6 (six) hours as needed.  (Patient not taking: No sig reported) 12/28/2020: Does not need   No facility-administered encounter medications on file as of 12/27/2020.    Functional Status:  In your present state of health, do you have any difficulty performing the following activities: 07/04/2020 05/18/2020  Hearing? N N  Vision? N N  Difficulty concentrating or making decisions?  N N  Walking or climbing stairs? N Y  Comment Per patient follows mobility restrictions. -  Dressing or bathing? N N  Comment Per patient follows mobility restrictions. -  Doing errands, shopping? Y N  Comment Per patient follows mobility restrictions. -  Preparing Food and eating ? N -  Using the Toilet? N -  In the past six months, have you accidently leaked urine? N -  Do you have problems with loss of bowel control? N -  Managing your Medications? N -  Managing your Finances? N -  Housekeeping or managing your  Housekeeping? Y -  Some recent data might be hidden    Fall/Depression Screening: Fall Risk  12/27/2020 12/27/2020 12/12/2020  Falls in the past year? 0 0 0  Comment - - -  Number falls in past yr: 0 0 0  Comment - - -  Injury with Fall? 0 0 0  Risk for fall due to : - - -  Risk for fall due to: Comment - - -  Follow up Falls prevention discussed;Education provided;Falls evaluation completed Falls prevention discussed;Education provided;Falls evaluation completed -   PHQ 2/9 Scores 12/27/2020 12/25/2020 11/27/2020 10/03/2020 07/26/2020 07/04/2020 06/27/2020  PHQ - 2 Score 1 0 0 2 0 0 1  PHQ- 9 Score _0 0 - -    Assessment:  Goals Addressed            This Visit's Progress   . Monitor and Manage My Blood Sugar-Diabetes Type 2       Timeframe:  Long-Range Goal Priority:  High Start Date: 12/27/20                            Expected End Date:  06/13/21                     Follow Up Date 04/13/21    - check blood sugar at prescribed times - enter blood sugar readings and medication or insulin into daily log - take the blood sugar meter to all doctor visits    Why is this important?    Checking your blood sugar at home helps to keep it from getting very high or very low.   Writing the results in a diary or log helps the doctor know how to care for you.   Your blood sugar log should have the time, date and the results.   Also, write down the amount of insulin or other medicine that you take.   Other information, like what you ate, exercise done and how you were feeling, will also be helpful.     Notes: Patient reports that she takes her blood sugar daily and records the values. Patient acknowledges that her A1c has elevated to 11.5. Patient explains that she has started the new medication Metformin to help lower her A1c and she will work on changing her diabetic diet by doing what she knows works best for her. Nurse will send patient diabetic education.    [facility]  Exam-Diabetes Type 2       Timeframe:  Long-Range Goal Priority:  High Start Date: 12/27/20                            Expected End Date: 04/13/21  Follow Up Date 04/13/21   - schedule appointment with eye doctor    Why is this important?    Eye check-ups are important when you have diabetes.   Vision loss can be prevented.    Notes: Patient stated that it has 3-4 years since she had an eye exam. Nurse provided education regarding eye damage risk with having diabetes and encouraged patient to make an appointment. Patient stated that she does need a new pair of eyeglasses and she will make an eye exam appointment.       Plan: RN Health Coach will send PCP a barrier letter and today's assessment note, will send patient diabetic education, and will call patient within the month of April. Follow-up:  Patient agrees to Care Plan and Follow-up.   Emelia Loron RN, BSN [place] [phone] [email or web address]

## 2020-12-28 NOTE — Patient Instructions (Signed)
Goals Addressed            This Visit's Progress   . Monitor and Manage My Blood Sugar-Diabetes Type 2       Timeframe:  Long-Range Goal Priority:  High Start Date: 12/27/20                            Expected End Date:  06/13/21                     Follow Up Date 04/13/21    - check blood sugar at prescribed times - enter blood sugar readings and medication or insulin into daily log - take the blood sugar meter to all doctor visits    Why is this important?    Checking your blood sugar at home helps to keep it from getting very high or very low.   Writing the results in a diary or log helps the doctor know how to care for you.   Your blood sugar log should have the time, date and the results.   Also, write down the amount of insulin or other medicine that you take.   Other information, like what you ate, exercise done and how you were feeling, will also be helpful.     Notes: Patient reports that she takes her blood sugar daily and records the values. Patient acknowledges that her A1c has elevated to 11.5. Patient explains that she has started the new medication Metformin to help lower her A1c and she will work on changing her diabetic diet by doing what she knows works best for her. Nurse will send patient diabetic education.    Clydene Pugh Eye Exam-Diabetes Type 2       Timeframe:  Long-Range Goal Priority:  High Start Date: 12/27/20                            Expected End Date: 04/13/21                    Follow Up Date 04/13/21   - schedule appointment with eye doctor    Why is this important?    Eye check-ups are important when you have diabetes.   Vision loss can be prevented.    Notes: Patient stated that it has 3-4 years since she had an eye exam. Nurse provided education regarding eye damage risk with having diabetes and encouraged patient to make an appointment. Patient stated that she does need a new pair of eyeglasses and she will make an eye exam appointment.

## 2020-12-29 LAB — CMP14+EGFR
ALT: 58 IU/L — ABNORMAL HIGH (ref 0–32)
AST: 34 IU/L (ref 0–40)
Albumin/Globulin Ratio: 1.4 (ref 1.2–2.2)
Albumin: 4.2 g/dL (ref 3.7–4.7)
Alkaline Phosphatase: 76 IU/L (ref 44–121)
BUN/Creatinine Ratio: 15 (ref 12–28)
BUN: 19 mg/dL (ref 8–27)
Bilirubin Total: 0.4 mg/dL (ref 0.0–1.2)
CO2: 22 mmol/L (ref 20–29)
Calcium: 9.5 mg/dL (ref 8.7–10.3)
Chloride: 99 mmol/L (ref 96–106)
Creatinine, Ser: 1.23 mg/dL — ABNORMAL HIGH (ref 0.57–1.00)
GFR calc Af Amer: 48 mL/min/{1.73_m2} — ABNORMAL LOW (ref >59)
GFR calc non Af Amer: 42 mL/min/{1.73_m2} — ABNORMAL LOW (ref >59)
Globulin, Total: 2.9 g/dL (ref 1.5–4.5)
Glucose: 254 mg/dL — ABNORMAL HIGH (ref 65–99)
Potassium: 4.9 mmol/L (ref 3.5–5.2)
Sodium: 138 mmol/L (ref 134–144)
Total Protein: 7.1 g/dL (ref 6.0–8.5)

## 2021-01-01 ENCOUNTER — Ambulatory Visit (INDEPENDENT_AMBULATORY_CARE_PROVIDER_SITE_OTHER): Payer: Medicare Other | Admitting: Nurse Practitioner

## 2021-01-01 ENCOUNTER — Encounter: Payer: Self-pay | Admitting: Nurse Practitioner

## 2021-01-01 ENCOUNTER — Ambulatory Visit: Payer: Medicare Other | Admitting: Cardiology

## 2021-01-01 ENCOUNTER — Other Ambulatory Visit: Payer: Self-pay

## 2021-01-01 DIAGNOSIS — Z0000 Encounter for general adult medical examination without abnormal findings: Secondary | ICD-10-CM

## 2021-01-01 NOTE — Progress Notes (Signed)
Subjective:   KYRA LAFFEY is a 80 y.o. female who presents for Medicare Annual (Subsequent) preventive examination.         Objective:    There were no vitals filed for this visit. There is no height or weight on file to calculate BMI.  Advanced Directives 12/27/2020 11/28/2020 07/04/2020 06/27/2020 05/18/2020 05/17/2020 09/19/2017  Does Patient Have a Medical Advance Directive? No No No No No No No  Does patient want to make changes to medical advance directive? No - Patient declined - - - - - -  Would patient like information on creating a medical advance directive? No - Patient declined No - Patient declined No - Patient declined No - Patient declined No - Patient declined - -    Current Medications (verified) Outpatient Encounter Medications as of 01/01/2021  Medication Sig  . aspirin EC 81 MG tablet Take 1 tablet (81 mg total) by mouth daily. Swallow whole.  Marland Kitchen atorvastatin (LIPITOR) 40 MG tablet TAKE 1 TABLET BY MOUTH AT BEDTIME.  Marland Kitchen Biotin w/ Vitamins C & E (HAIR SKIN & NAILS GUMMIES PO) Take 2 each by mouth every morning.  . blood glucose meter kit and supplies Dispense based on patient and insurance preference. Use up to four times daily as directed. (FOR ICD-10 E10.9, E11.9).  Marland Kitchen Cetirizine HCl 10 MG CAPS Take by mouth.  . fish oil-omega-3 fatty acids 1000 MG capsule Take 1 g by mouth 2 (two) times daily.  Marland Kitchen linagliptin (TRADJENTA) 5 MG TABS tablet Take 1 tablet (5 mg total) by mouth daily.  . metFORMIN (GLUCOPHAGE) 500 MG tablet Take 1 tablet (500 mg total) by mouth 2 (two) times daily with a meal.  . metoprolol tartrate (LOPRESSOR) 25 MG tablet TAKE (1/2) TABLET BY MOUTH 2 TIMES A DAY.  . Multiple Vitamin (MULITIVITAMIN WITH MINERALS) TABS Take 1 tablet by mouth daily.  . Multiple Vitamins-Minerals (AIRBORNE GUMMIES PO) Take 1 each by mouth every morning.  . nitroGLYCERIN (NITROSTAT) 0.4 MG SL tablet Place 1 tablet (0.4 mg total) under the tongue every 5 (five) minutes x 3  doses as needed for chest pain (if no relief after 3rd dose, proceed to the ED for an evaluation or call 911).  . traMADol (ULTRAM) 50 MG tablet Take by mouth every 6 (six) hours as needed.  (Patient not taking: No sig reported)   No facility-administered encounter medications on file as of 01/01/2021.    Allergies (verified) Contrast media [iodinated diagnostic agents], Codeine, Morphine and related, Other, and Penicillins   History: Past Medical History:  Diagnosis Date  . Anxiety   . CAD (coronary artery disease)    Multivessel status post CABG June 2021 - LIMA to LAD, SVG to distal RCA, SVG to OM1  . Chronic obstructive pulmonary disease, unspecified ([place])   . Depression   . Essential hypertension   . GERD   . History of pneumonia   . Incarcerated ventral hernia   . Osteoarthritis   . Type 2 diabetes mellitus ([place])    Past Surgical History:  Procedure Laterality Date  . CORONARY ARTERY BYPASS GRAFT N/A 05/22/2020   Procedure: CORONARY ARTERY BYPASS GRAFTING (CABG) x3 LIMA TO LAD, SVG TO OM1, SVG TO DISTAL RIGHT;  Surgeon: Lajuana Matte, MD;  Location: [facility];  Service: Open Heart Surgery;  Laterality: N/A;  . LEFT HEART CATH AND CORONARY ANGIOGRAPHY N/A 05/21/2020   Procedure: LEFT HEART CATH AND CORONARY ANGIOGRAPHY;  Surgeon: Martinique, Peter M, MD;  Location: [facility];  Service: Cardiovascular;  Laterality: N/A;  . NEPHRECTOMY    . TEE WITHOUT CARDIOVERSION N/A 05/22/2020   Procedure: TRANSESOPHAGEAL ECHOCARDIOGRAM (TEE);  Surgeon: Lajuana Matte, MD;  Location: [facility];  Service: Open Heart Surgery;  Laterality: N/A;  . VEIN HARVEST Right 05/22/2020   Procedure: Open Vein Harvest;  Surgeon: Lajuana Matte, MD;  Location: [facility];  Service: Open Heart Surgery;  Laterality: Right;  . VENTRAL HERNIA REPAIR  09/01/2017   mistaken entry   Family History  Problem Relation Age of Onset  . CAD Father   . Heart failure Sister    Social History   Socioeconomic  History  . Marital status: Widowed    Spouse name: Not on file  . Number of children: 1  . Years of education: 69  . Highest education level: 12th grade  Occupational History  . Occupation: Retired  Tobacco Use  . Smoking status: Never Smoker  . Smokeless tobacco: Never Used  Vaping Use  . Vaping Use: Never used  Substance and Sexual Activity  . Alcohol use: No  . Drug use: No  . Sexual activity: Never  Other Topics Concern  . Not on file  Social History Narrative   Lives alone.  Has 1 daughter      Enjoys reading and cooking      Eats all food groups   Caffeine, some coffee   Water reports drinking pretty well throughout the day      Social Determinants of Health   Financial Resource Strain: Low Risk   . Difficulty of Paying Living Expenses: Not hard at all  Food Insecurity: No Food Insecurity  . Worried About Charity fundraiser in the Last Year: Never true  . Ran Out of Food in the Last Year: Never true  Transportation Needs: No Transportation Needs  . Lack of Transportation (Medical): No  . Lack of Transportation (Non-Medical): No  Physical Activity: Inactive  . Days of Exercise per Week: 0 days  . Minutes of Exercise per Session: 0 min  Stress: No Stress Concern Present  . Feeling of Stress : Only a little  Social Connections: Socially Isolated  . Frequency of Communication with Friends and Family: More than three times a week  . Frequency of Social Gatherings with Friends and Family: More than three times a week  . Attends Religious Services: Never  . Active Member of Clubs or Organizations: No  . Attends Archivist Meetings: Never  . Marital Status: Widowed    Tobacco Counseling Counseling given: Not Answered   Clinical Intake:                 Diabetic? Yes         Activities of Daily Living In your present state of health, do you have any difficulty performing the following activities: 07/04/2020 05/18/2020  Hearing? N N   Vision? N N  Difficulty concentrating or making decisions? N N  Walking or climbing stairs? N Y  Comment Per patient follows mobility restrictions. -  Dressing or bathing? N N  Comment Per patient follows mobility restrictions. -  Doing errands, shopping? Y N  Comment Per patient follows mobility restrictions. -  Preparing Food and eating ? N -  Using the Toilet? N -  In the past six months, have you accidently leaked urine? N -  Do you have problems with loss of bowel control? N -  Managing your Medications? N -  Managing your Finances? N -  Housekeeping or managing your Housekeeping? Y -  Some recent data might be hidden    Patient Care Team: Perlie Mayo, NP as PCP - General (Family Medicine) Satira Sark, MD as PCP - Cardiology (Cardiology) Verta Ellen., NP as Nurse Practitioner (Cardiology) Laretta Alstrom Argie Ramming, RN as [place] any recent Medical Services you may have received from other than [facility] providers in the past year (date may be approximate).     Assessment:   This is a routine wellness examination for Evely.  Hearing/Vision screen No exam data present  Dietary issues and exercise activities discussed:    Goals    . Monitor and Manage My Blood Sugar-Diabetes Type 2     Timeframe:  Long-Range Goal Priority:  High Start Date: 12/27/20                            Expected End Date:  06/13/21                     Follow Up Date 04/13/21    - check blood sugar at prescribed times - enter blood sugar readings and medication or insulin into daily log - take the blood sugar meter to all doctor visits    Why is this important?    Checking your blood sugar at home helps to keep it from getting very high or very low.   Writing the results in a diary or log helps the doctor know how to care for you.   Your blood sugar log should have the time, date and the results.   Also, write down the amount of insulin or other  medicine that you take.   Other information, like what you ate, exercise done and how you were feeling, will also be helpful.     Notes: Patient reports that she takes her blood sugar daily and records the values. Patient acknowledges that her A1c has elevated to 11.5. Patient explains that she has started the new medication Metformin to help lower her A1c and she will work on changing her diabetic diet by doing what she knows works best for her. Nurse will send patient diabetic education.    Illa Level Eye Exam-Diabetes Type 2     Timeframe:  Long-Range Goal Priority:  High Start Date: 12/27/20                            Expected End Date: 04/13/21                    Follow Up Date 04/13/21   - schedule appointment with eye doctor    Why is this important?    Eye check-ups are important when you have diabetes.   Vision loss can be prevented.    Notes: Patient stated that it has 3-4 years since she had an eye exam. Nurse provided education regarding eye damage risk with having diabetes and encouraged patient to make an appointment. Patient stated that she does need a new pair of eyeglasses and she will make an eye exam appointment.       Depression Screen PHQ 2/9 Scores 12/27/2020 12/25/2020 11/27/2020 10/03/2020 07/26/2020 07/04/2020 06/27/2020  PHQ - 2 Score 1 0 0 2 0 0 1  PHQ- 9 Score 7 5 4 2  0 - -  Fall Risk Fall Risk  12/27/2020 12/27/2020 12/12/2020 11/27/2020 10/03/2020  Falls in the past year? 0 0 0 0 0  Comment - - - - -  Number falls in past yr: 0 0 0 0 0  Comment - - - - -  Injury with Fall? 0 0 0 0 0  Risk for fall due to : - - - No Fall Risks No Fall Risks  Risk for fall due to: Comment - - - - -  Follow up Falls prevention discussed;Education provided;Falls evaluation completed Falls prevention discussed;Education provided;Falls evaluation completed - Falls evaluation completed Falls evaluation completed    FALL RISK PREVENTION PERTAINING TO THE HOME:  Any stairs in  or around the home? No  If so, are there any without handrails? No  Home free of loose throw rugs in walkways, pet beds, electrical cords, etc? Yes  Adequate lighting in your home to reduce risk of falls? Yes   ASSISTIVE DEVICES UTILIZED TO PREVENT FALLS:  Life alert? No  Use of a cane, walker or w/c? No  Grab bars in the bathroom? No  Shower chair or bench in shower? No  Elevated toilet seat or a handicapped toilet? No   TIMED UP AND GO:  Was the test performed? No .     Cognitive Function:        Immunizations Immunization History  Administered Date(s) Administered  . Influenza, High Dose Seasonal PF 11/12/2017, 08/13/2018  . Influenza-Unspecified 11/12/2011, 11/03/2012, 11/02/2013, 09/20/2014, 09/19/2015, 09/23/2016, 08/18/2019  . Pneumococcal Conjugate-13 09/20/2014  . Pneumococcal Polysaccharide-23 03/18/2016    TDAP status: Due, Education has been provided regarding the importance of this vaccine. Advised may receive this vaccine at local pharmacy or Health Dept. Aware to provide a copy of the vaccination record if obtained from local pharmacy or Health Dept. Verbalized acceptance and understanding.  Flu Vaccine status: Up to date  Pneumococcal vaccine status: Due, Education has been provided regarding the importance of this vaccine. Advised may receive this vaccine at local pharmacy or Health Dept. Aware to provide a copy of the vaccination record if obtained from local pharmacy or Health Dept. Verbalized acceptance and understanding.  Covid-19 vaccine status: Declined, Education has been provided regarding the importance of this vaccine but patient still declined. Advised may receive this vaccine at local pharmacy or Health Dept.or vaccine clinic. Aware to provide a copy of the vaccination record if obtained from local pharmacy or Health Dept. Verbalized acceptance and understanding.  Qualifies for Shingles Vaccine? Yes   Zostavax completed No   Shingrix Completed?:  No.    Education has been provided regarding the importance of this vaccine. Patient has been advised to call insurance company to determine out of pocket expense if they have not yet received this vaccine. Advised may also receive vaccine at local pharmacy or Health Dept. Verbalized acceptance and understanding.  Screening Tests Health Maintenance  Topic Date Due  . Hepatitis C Screening  Never done  . OPHTHALMOLOGY EXAM  Never done  . TETANUS/TDAP  Never done  . DEXA SCAN  Never done  . COVID-19 Vaccine (1) 01/10/2021 (Originally 09/29/1946)  . INFLUENZA VACCINE  03/14/2021 (Originally 07/15/2020)  . FOOT EXAM  03/07/2021  . URINE MICROALBUMIN  03/07/2021  . HEMOGLOBIN A1C  05/28/2021  . PNA vac Low Risk Adult  Completed    Health Maintenance  Health Maintenance Due  Topic Date Due  . Hepatitis C Screening  Never done  . OPHTHALMOLOGY EXAM  Never done  .  TETANUS/TDAP  Never done  . DEXA SCAN  Never done    Colorectal cancer screening: No longer required.   Mammogram status: No longer required due to age.  Bone Density Screening: Declined.   Lung Cancer Screening: (Low Dose CT Chest recommended if Age 48-80 years, 30 pack-year currently smoking OR have quit w/in 15years.) does not qualify.    Additional Screening:  Hepatitis C Screening: does qualify; Completed.   Vision Screening: Recommended annual ophthalmology exams for early detection of glaucoma and other disorders of the eye. Is the patient up to date with their annual eye exam?  No  Who is the provider or what is the name of the office in which the patient attends annual eye exams? Has not had exam in the last yr. - [facility] Led[facility] [place]  If pt is not established with a provider, would they like to be referred to a provider to establish care? No .   Dental Screening: Recommended annual dental exams for proper oral hygiene  Community Resource Referral / Chronic Care Management: CRR required this visit?  No   CCM  required this visit?  No      Plan:     I have personally reviewed and noted the following in the patient's chart:   . Medical and social history . Use of alcohol, tobacco or illicit drugs  . Current medications and supplements . Functional ability and status . Nutritional status . Physical activity . Advanced directives . List of other physicians . Hospitalizations, surgeries, and ER visits in previous 12 months . Vitals . Screenings to include cognitive, depression, and falls . Referrals and appointments  In addition, I have reviewed and discussed with patient certain preventive protocols, quality metrics, and best practice recommendations. A written personalized care plan for preventive services as well as general preventive health recommendations were provided to patient.     Lonn Georgia, LPN   [id number]   Nurse Notes: AWV conducted over the phone with pt consent to televisit via audio. Pt was present in the home at the time of call and provider off site. This call took approx 20 min. To complete.

## 2021-01-01 NOTE — Patient Instructions (Signed)
[name] [name] , Thank you for taking time to come for your Medicare Wellness Visit. I appreciate your ongoing commitment to your health goals. Please review the following plan we discussed and let me know if I can assist you in the future.   Screening recommendations/referrals: Colonoscopy: Pt declined. No longer required.  Mammogram: Pt declined.  Bone Density: Pt declined.   Recommended yearly ophthalmology/optometry visit for glaucoma screening and checkup Recommended yearly dental visit for hygiene and checkup  Vaccinations: Influenza vaccine: Up to date.  Pneumococcal vaccine: Declined.  Tdap vaccine: Declined.  Shingles vaccine: Declined.     Advanced directives: N/A   Conditions/risks identified: None   Next appointment: none scheduled at this time.    Preventive Care 69 Years and Older, Female Preventive care refers to lifestyle choices and visits with your health care provider that can promote health and wellness. What does preventive care include?  A yearly physical exam. This is also called an annual well check.  Dental exams once or twice a year.  Routine eye exams. Ask your health care provider how often you should have your eyes checked.  Personal lifestyle choices, including:  Daily care of your teeth and gums.  Regular physical activity.  Eating a healthy diet.  Avoiding tobacco and drug use.  Limiting alcohol use.  Practicing safe sex.  Taking low-dose aspirin every day.  Taking vitamin and mineral supplements as recommended by your health care provider. What happens during an annual well check? The services and screenings done by your health care provider during your annual well check will depend on your age, overall health, lifestyle risk factors, and family history of disease. Counseling  Your health care provider may ask you questions about your:  Alcohol use.  Tobacco use.  Drug use.  Emotional well-being.  Home and relationship  well-being.  Sexual activity.  Eating habits.  History of falls.  Memory and ability to understand (cognition).  Work and work Astronomer.  Reproductive health. Screening  You may have the following tests or measurements:  Height, weight, and BMI.  Blood pressure.  Lipid and cholesterol levels. These may be checked every 5 years, or more frequently if you are over 66 years old.  Skin check.  Lung cancer screening. You may have this screening every year starting at age 70 if you have a 30-pack-year history of smoking and currently smoke or have quit within the past 15 years.  Fecal occult blood test (FOBT) of the stool. You may have this test every year starting at age 71.  Flexible sigmoidoscopy or colonoscopy. You may have a sigmoidoscopy every 5 years or a colonoscopy every 10 years starting at age 74.  Hepatitis C blood test.  Hepatitis B blood test.  Sexually transmitted disease (STD) testing.  Diabetes screening. This is done by checking your blood sugar (glucose) after you have not eaten for a while (fasting). You may have this done every 1-3 years.  Bone density scan. This is done to screen for osteoporosis. You may have this done starting at age 2.  Mammogram. This may be done every 1-2 years. Talk to your health care provider about how often you should have regular mammograms. Talk with your health care provider about your test results, treatment options, and if necessary, the need for more tests. Vaccines  Your health care provider may recommend certain vaccines, such as:  Influenza vaccine. This is recommended every year.  Tetanus, diphtheria, and acellular pertussis (Tdap, Td) vaccine. You may need  a Td booster every 10 years.  Zoster vaccine. You may need this after age 37.  Pneumococcal 13-valent conjugate (PCV13) vaccine. One dose is recommended after age 79.  Pneumococcal polysaccharide (PPSV23) vaccine. One dose is recommended after age  33. Talk to your health care provider about which screenings and vaccines you need and how often you need them. This information is not intended to replace advice given to you by your health care provider. Make sure you discuss any questions you have with your health care provider. Document Released: 12/28/2015 Document Revised: 08/20/2016 Document Reviewed: 10/02/2015 Elsevier Interactive Patient Education  2017 [place] Prevention in the Home Falls can cause injuries. They can happen to people of all ages. There are many things you can do to make your home safe and to help prevent falls. What can I do on the outside of my home?  Regularly fix the edges of walkways and driveways and fix any cracks.  Remove anything that might make you trip as you walk through a door, such as a raised step or threshold.  Trim any bushes or trees on the path to your home.  Use bright outdoor lighting.  Clear any walking paths of anything that might make someone trip, such as rocks or tools.  Regularly check to see if handrails are loose or broken. Make sure that both sides of any steps have handrails.  Any raised decks and porches should have guardrails on the edges.  Have any leaves, snow, or ice cleared regularly.  Use sand or salt on walking paths during winter.  Clean up any spills in your garage right away. This includes oil or grease spills. What can I do in the bathroom?  Use night lights.  Install grab bars by the toilet and in the tub and shower. Do not use towel bars as grab bars.  Use non-skid mats or decals in the tub or shower.  If you need to sit down in the shower, use a plastic, non-slip stool.  Keep the floor dry. Clean up any water that spills on the floor as soon as it happens.  Remove soap buildup in the tub or shower regularly.  Attach bath mats securely with double-sided non-slip rug tape.  Do not have throw rugs and other things on the floor that can make  you trip. What can I do in the bedroom?  Use night lights.  Make sure that you have a light by your bed that is easy to reach.  Do not use any sheets or blankets that are too big for your bed. They should not hang down onto the floor.  Have a firm chair that has side arms. You can use this for support while you get dressed.  Do not have throw rugs and other things on the floor that can make you trip. What can I do in the kitchen?  Clean up any spills right away.  Avoid walking on wet floors.  Keep items that you use a lot in easy-to-reach places.  If you need to reach something above you, use a strong step stool that has a grab bar.  Keep electrical cords out of the way.  Do not use floor polish or wax that makes floors slippery. If you must use wax, use non-skid floor wax.  Do not have throw rugs and other things on the floor that can make you trip. What can I do with my stairs?  Do not leave any items on the  stairs.  Make sure that there are handrails on both sides of the stairs and use them. Fix handrails that are broken or loose. Make sure that handrails are as long as the stairways.  Check any carpeting to make sure that it is firmly attached to the stairs. Fix any carpet that is loose or worn.  Avoid having throw rugs at the top or bottom of the stairs. If you do have throw rugs, attach them to the floor with carpet tape.  Make sure that you have a light switch at the top of the stairs and the bottom of the stairs. If you do not have them, ask someone to add them for you. What else can I do to help prevent falls?  Wear shoes that:  Do not have high heels.  Have rubber bottoms.  Are comfortable and fit you well.  Are closed at the toe. Do not wear sandals.  If you use a stepladder:  Make sure that it is fully opened. Do not climb a closed stepladder.  Make sure that both sides of the stepladder are locked into place.  Ask someone to hold it for you, if  possible.  Clearly mark and make sure that you can see:  Any grab bars or handrails.  First and last steps.  Where the edge of each step is.  Use tools that help you move around (mobility aids) if they are needed. These include:  Canes.  Walkers.  Scooters.  Crutches.  Turn on the lights when you go into a dark area. Replace any light bulbs as soon as they burn out.  Set up your furniture so you have a clear path. Avoid moving your furniture around.  If any of your floors are uneven, fix them.  If there are any pets around you, be aware of where they are.  Review your medicines with your doctor. Some medicines can make you feel dizzy. This can increase your chance of falling. Ask your doctor what other things that you can do to help prevent falls. This information is not intended to replace advice given to you by your health care provider. Make sure you discuss any questions you have with your health care provider. Document Released: 09/27/2009 Document Revised: 05/08/2016 Document Reviewed: 01/05/2015 Elsevier Interactive Patient Education  2017 [employer].

## 2021-01-25 ENCOUNTER — Other Ambulatory Visit: Payer: Self-pay | Admitting: *Deleted

## 2021-01-25 NOTE — Patient Outreach (Addendum)
[facility]) Care Management  01/25/2021  [name] [name] 07-Jul-1941 [id number]  Patient will transition to an embedded CM.  Plan: Nurse Health Coach will close case  Blanchie Serve RN, BSN Nurse [facility] [phone] [name]

## 2021-01-29 DIAGNOSIS — E1140 Type 2 diabetes mellitus with diabetic neuropathy, unspecified: Secondary | ICD-10-CM | POA: Diagnosis not present

## 2021-01-29 DIAGNOSIS — E1151 Type 2 diabetes mellitus with diabetic peripheral angiopathy without gangrene: Secondary | ICD-10-CM | POA: Diagnosis not present

## 2021-02-03 ENCOUNTER — Encounter (HOSPITAL_COMMUNITY): Payer: Self-pay | Admitting: *Deleted

## 2021-02-03 ENCOUNTER — Other Ambulatory Visit: Payer: Self-pay

## 2021-02-03 ENCOUNTER — Emergency Department (HOSPITAL_COMMUNITY)
Admission: EM | Admit: 2021-02-03 | Discharge: 2021-02-03 | Disposition: A | Discharge status: Home / Self Care | Payer: Medicare Other | Attending: Emergency Medicine | Admitting: Emergency Medicine

## 2021-02-03 ENCOUNTER — Emergency Department (HOSPITAL_COMMUNITY): Payer: Medicare Other

## 2021-02-03 DIAGNOSIS — I25810 Atherosclerosis of coronary artery bypass graft(s) without angina pectoris: Secondary | ICD-10-CM | POA: Diagnosis not present

## 2021-02-03 DIAGNOSIS — R9431 Abnormal electrocardiogram [ECG] [EKG]: Secondary | ICD-10-CM | POA: Diagnosis not present

## 2021-02-03 DIAGNOSIS — K219 Gastro-esophageal reflux disease without esophagitis: Secondary | ICD-10-CM | POA: Diagnosis not present

## 2021-02-03 DIAGNOSIS — J449 Chronic obstructive pulmonary disease, unspecified: Secondary | ICD-10-CM | POA: Diagnosis not present

## 2021-02-03 DIAGNOSIS — I1310 Hypertensive heart and chronic kidney disease without heart failure, with stage 1 through stage 4 chronic kidney disease, or unspecified chronic kidney disease: Secondary | ICD-10-CM | POA: Diagnosis not present

## 2021-02-03 DIAGNOSIS — I2510 Atherosclerotic heart disease of native coronary artery without angina pectoris: Secondary | ICD-10-CM | POA: Diagnosis not present

## 2021-02-03 DIAGNOSIS — K439 Ventral hernia without obstruction or gangrene: Secondary | ICD-10-CM | POA: Diagnosis not present

## 2021-02-03 DIAGNOSIS — Z79899 Other long term (current) drug therapy: Secondary | ICD-10-CM | POA: Diagnosis not present

## 2021-02-03 DIAGNOSIS — Z7984 Long term (current) use of oral hypoglycemic drugs: Secondary | ICD-10-CM | POA: Insufficient documentation

## 2021-02-03 DIAGNOSIS — R140 Abdominal distension (gaseous): Secondary | ICD-10-CM

## 2021-02-03 DIAGNOSIS — K469 Unspecified abdominal hernia without obstruction or gangrene: Secondary | ICD-10-CM | POA: Diagnosis not present

## 2021-02-03 DIAGNOSIS — N1832 Chronic kidney disease, stage 3b: Secondary | ICD-10-CM | POA: Insufficient documentation

## 2021-02-03 DIAGNOSIS — E1122 Type 2 diabetes mellitus with diabetic chronic kidney disease: Secondary | ICD-10-CM | POA: Insufficient documentation

## 2021-02-03 DIAGNOSIS — Z7982 Long term (current) use of aspirin: Secondary | ICD-10-CM | POA: Insufficient documentation

## 2021-02-03 DIAGNOSIS — K432 Incisional hernia without obstruction or gangrene: Secondary | ICD-10-CM | POA: Diagnosis not present

## 2021-02-03 DIAGNOSIS — G8929 Other chronic pain: Secondary | ICD-10-CM | POA: Diagnosis not present

## 2021-02-03 LAB — URINALYSIS, ROUTINE W REFLEX MICROSCOPIC
Bilirubin Urine: NEGATIVE
Glucose, UA: >=500 mg/dL — AB
Hgb urine dipstick: NEGATIVE
Ketones, ur: NEGATIVE mg/dL
Leukocytes,Ua: NEGATIVE
Nitrite: NEGATIVE
Protein, ur: NEGATIVE mg/dL
Specific Gravity, Urine: 1.012 (ref 1.005–1.030)
pH: 5 (ref 5.0–8.0)

## 2021-02-03 LAB — COMPREHENSIVE METABOLIC PANEL
ALT: 31 U/L (ref 0–44)
AST: 20 U/L (ref 15–41)
Albumin: 3.7 g/dL (ref 3.5–5.0)
Alkaline Phosphatase: 54 U/L (ref 38–126)
Anion gap: 7 (ref 5–15)
BUN: 28 mg/dL — ABNORMAL HIGH (ref 8–23)
CO2: 26 mmol/L (ref 22–32)
Calcium: 9.1 mg/dL (ref 8.9–10.3)
Chloride: 103 mmol/L (ref 98–111)
Creatinine, Ser: 1.16 mg/dL — ABNORMAL HIGH (ref 0.44–1.00)
GFR, Estimated: 48 mL/min — ABNORMAL LOW (ref >60)
Glucose, Bld: 321 mg/dL — ABNORMAL HIGH (ref 70–99)
Potassium: 4.7 mmol/L (ref 3.5–5.1)
Sodium: 136 mmol/L (ref 135–145)
Total Bilirubin: 0.3 mg/dL (ref 0.3–1.2)
Total Protein: 7.1 g/dL (ref 6.5–8.1)

## 2021-02-03 LAB — CBC WITH DIFFERENTIAL/PLATELET
Abs Immature Granulocytes: 0.04 10*3/uL (ref 0.00–0.07)
Basophils Absolute: 0.1 10*3/uL (ref 0.0–0.1)
Basophils Relative: 1 %
Eosinophils Absolute: 0.2 10*3/uL (ref 0.0–0.5)
Eosinophils Relative: 2 %
HCT: 39.5 % (ref 36.0–46.0)
Hemoglobin: 13.1 g/dL (ref 12.0–15.0)
Immature Granulocytes: 1 %
Lymphocytes Relative: 22 %
Lymphs Abs: 1.8 10*3/uL (ref 0.7–4.0)
MCH: 31.4 pg (ref 26.0–34.0)
MCHC: 33.2 g/dL (ref 30.0–36.0)
MCV: 94.7 fL (ref 80.0–100.0)
Monocytes Absolute: 0.5 10*3/uL (ref 0.1–1.0)
Monocytes Relative: 6 %
Neutro Abs: 5.6 10*3/uL (ref 1.7–7.7)
Neutrophils Relative %: 68 %
Platelets: 274 10*3/uL (ref 150–400)
RBC: 4.17 MIL/uL (ref 3.87–5.11)
RDW: 12 % (ref 11.5–15.5)
WBC: 8.1 10*3/uL (ref 4.0–10.5)
nRBC: 0 % (ref 0.0–0.2)

## 2021-02-03 LAB — LIPASE, BLOOD: Lipase: 58 U/L — ABNORMAL HIGH (ref 11–51)

## 2021-02-03 NOTE — ED Provider Notes (Signed)
[facility] EMERGENCY DEPARTMENT Provider Note   CSN: [id number] Arrival date & time: 02/03/21  1300     History Chief Complaint  Patient presents with  . abd swelling.     [name] [name] is a 80 y.o. female with a history of type 2 diabetes, GERD, hypertension, COPD, COPD with CABG June 2021, surgical history significant for nephrectomy, but not ventral hernia repair per patient even though it is documented in his chart presenting with abdominal pain and distention which started this morning.  She describes abdominal swelling on the left side of her abdomen which started gradually this morning became very severe but then was able to pass some flatus and had a small nonbloody bowel movement, still has abdominal pain and distention but better than earlier. She denies constipation. She also describes a fleeting stabs of pain described as a "electric current" that radiated from her mid abdomen to her left chest region, lasted 1 to 2 seconds and then resolved.  She denies shortness of breath, no fevers or chills.  She was nauseated this morning but not currently, no emesis.  She has had no medications for pain relief. pain is worsened with movement, better at rest.  The history is provided by the patient.       Past Medical History:  Diagnosis Date  . Anxiety   . CAD (coronary artery disease)    Multivessel status post CABG June 2021 - LIMA to LAD, SVG to distal RCA, SVG to OM1  . Chronic obstructive pulmonary disease, unspecified ([place])   . Depression   . Essential hypertension   . GERD   . History of pneumonia   . Incarcerated ventral hernia   . Osteoarthritis   . Type 2 diabetes mellitus [facility])     Patient Active Problem List   Diagnosis Date Noted  . Medication management 12/12/2020  . Mixed hyperlipidemia 11/27/2020  . Left lower quadrant abdominal mass 11/27/2020  . Abdominal wall mass of left flank 11/27/2020  . Acute pain of left shoulder 10/03/2020  . Joint stiffness of  hand, left 10/03/2020  . Gastro-esophageal reflux disease with esophagitis   . Major depressive disorder, single episode, unspecified   . Unspecified osteoarthritis, unspecified site   . Atherosclerotic heart disease of native coronary artery without angina pectoris   . Chronic kidney disease, stage 3b ([place])   . Hypertensive heart disease without heart failure   . Type 2 diabetes mellitus with diabetic nephropathy ([place])   . Ventral hernia without obstruction or gangrene 09/01/2017    Past Surgical History:  Procedure Laterality Date  . CORONARY ARTERY BYPASS GRAFT N/A 05/22/2020   Procedure: CORONARY ARTERY BYPASS GRAFTING (CABG) x3 LIMA TO LAD, SVG TO OM1, SVG TO DISTAL RIGHT;  Surgeon: Lajuana Matte, MD;  Location: [place];  Service: Open Heart Surgery;  Laterality: N/A;  . LEFT HEART CATH AND CORONARY ANGIOGRAPHY N/A 05/21/2020   Procedure: LEFT HEART CATH AND CORONARY ANGIOGRAPHY;  Surgeon: Martinique, Peter M, MD;  Location: [place] CV LAB;  Service: Cardiovascular;  Laterality: N/A;  . NEPHRECTOMY    . TEE WITHOUT CARDIOVERSION N/A 05/22/2020   Procedure: TRANSESOPHAGEAL ECHOCARDIOGRAM (TEE);  Surgeon: Lajuana Matte, MD;  Location: [place];  Service: Open Heart Surgery;  Laterality: N/A;  . VEIN HARVEST Right 05/22/2020   Procedure: Open Vein Harvest;  Surgeon: Lajuana Matte, MD;  Location: [place];  Service: Open Heart Surgery;  Laterality: Right;  . VENTRAL HERNIA REPAIR  09/01/2017  mistaken entry     OB History   No obstetric history on file.     Family History  Problem Relation Age of Onset  . CAD Father   . Heart failure Sister     Social History   Tobacco Use  . Smoking status: Never Smoker  . Smokeless tobacco: Never Used  Vaping Use  . Vaping Use: Never used  Substance Use Topics  . Alcohol use: No  . Drug use: No    Home Medications Prior to Admission medications   Medication Sig Start Date End Date Taking? Authorizing Provider  aspirin EC  81 MG tablet Take 1 tablet (81 mg total) by mouth daily. Swallow whole. 12/25/20  Yes Satira Sark, MD  atorvastatin (LIPITOR) 40 MG tablet TAKE 1 TABLET BY MOUTH AT BEDTIME. 11/21/20  Yes Verta Ellen., NP  Biotin w/ Vitamins C & E (HAIR SKIN & NAILS GUMMIES PO) Take 2 each by mouth every morning.   Yes [provider]  fish oil-omega-3 fatty acids 1000 MG capsule Take 1 g by mouth 2 (two) times daily.   Yes [provider]  linagliptin (TRADJENTA) 5 MG TABS tablet Take 1 tablet (5 mg total) by mouth daily. 12/21/20  Yes Perlie Mayo, NP  metFORMIN (GLUCOPHAGE) 500 MG tablet Take 1 tablet (500 mg total) by mouth 2 (two) times daily with a meal. 11/29/20  Yes Perlie Mayo, NP  metoprolol tartrate (LOPRESSOR) 25 MG tablet TAKE (1/2) TABLET BY MOUTH 2 TIMES A DAY. Patient taking differently: Take 12.5 mg by mouth 2 (two) times daily. 11/21/20  Yes Verta Ellen., NP  Multiple Vitamin (MULITIVITAMIN WITH MINERALS) TABS Take 1 tablet by mouth daily.   Yes [provider]  Multiple Vitamins-Minerals (AIRBORNE GUMMIES PO) Take 1 each by mouth every morning.   Yes [provider]  nitroGLYCERIN (NITROSTAT) 0.4 MG SL tablet Place 1 tablet (0.4 mg total) under the tongue every 5 (five) minutes x 3 doses as needed for chest pain (if no relief after 3rd dose, proceed to the ED for an evaluation or call 911). 08/31/20 11/29/20 Yes Satira Sark, MD  blood glucose meter kit and supplies Dispense based on patient and insurance preference. Use up to four times daily as directed. (FOR ICD-10 E10.9, E11.9). 11/28/20   Perlie Mayo, NP  Cetirizine HCl 10 MG CAPS Take by mouth.    [provider]  traMADol (ULTRAM) 50 MG tablet Take by mouth every 6 (six) hours as needed.    [provider]    Allergies    Contrast media [iodinated diagnostic agents], Codeine, Morphine and related, Other, and Penicillins  Review of Systems   Review of  Systems  Constitutional: Negative for chills and fever.  HENT: Negative for congestion.   Eyes: Negative.   Respiratory: Negative for chest tightness and shortness of breath.   Cardiovascular: Negative for chest pain.  Gastrointestinal: Positive for abdominal distention, abdominal pain and nausea. Negative for constipation, diarrhea and vomiting.  Genitourinary: Negative.   Musculoskeletal: Negative for arthralgias, joint swelling and neck pain.  Skin: Negative.  Negative for rash and wound.  Neurological: Negative for dizziness, weakness, light-headedness, numbness and headaches.  Psychiatric/Behavioral: Negative.   All other systems reviewed and are negative.   Physical Exam Updated Vital Signs BP (!) 116/54   Pulse 69   Temp 98.3 F (36.8 C) (Oral)   Resp 20   Ht 4' 11"  (1.499 m)  Wt 72.1 kg   SpO2 94%   BMI 32.11 kg/m   Physical Exam Vitals and nursing note reviewed.  Constitutional:      Appearance: She is well-developed and well-nourished.  HENT:     Head: Normocephalic and atraumatic.  Eyes:     Conjunctiva/sclera: Conjunctivae normal.  Cardiovascular:     Rate and Rhythm: Normal rate and regular rhythm.     Pulses: Intact distal pulses.     Heart sounds: Normal heart sounds.  Pulmonary:     Effort: Pulmonary effort is normal.     Breath sounds: Normal breath sounds. No wheezing.  Abdominal:     General: Bowel sounds are increased. There is distension.     Palpations: Abdomen is soft.     Tenderness: There is abdominal tenderness.     Comments: Increased tympany to percussion throughout abd.  Pt refuses exam of left abdomen over site of distention.  Old burn scarring noted right abdominal wall.  Musculoskeletal:        General: Normal range of motion.     Cervical back: Normal range of motion.  Skin:    General: Skin is warm and dry.  Neurological:     Mental Status: She is alert.  Psychiatric:        Mood and Affect: Mood and affect normal.     ED  Results / Procedures / Treatments   Labs (all labs ordered are listed, but only abnormal results are displayed) Labs Reviewed  COMPREHENSIVE METABOLIC PANEL - Abnormal; Notable for the following components:      Result Value   Glucose, Bld 321 (*)    BUN 28 (*)    Creatinine, Ser 1.16 (*)    GFR, Estimated 48 (*)    All other components within normal limits  LIPASE, BLOOD - Abnormal; Notable for the following components:   Lipase 58 (*)    All other components within normal limits  URINALYSIS, ROUTINE W REFLEX MICROSCOPIC - Abnormal; Notable for the following components:   Glucose, UA >=500 (*)    Bacteria, UA MANY (*)    All other components within normal limits  CBC WITH DIFFERENTIAL/PLATELET    EKG EKG Interpretation  Date/Time:  Sunday February 03 2021 13:48:45 EST Ventricular Rate:  82 PR Interval:    QRS Duration: 97 QT Interval:  321 QTC Calculation: 375 R Axis:   91 Text Interpretation: Sinus rhythm Prolonged PR interval Right axis deviation Low voltage, precordial leads Borderline repolarization abnormality NSR. inferiorlateral ST changes are old Confirmed by Lavenia Atlas [phone]) on 02/03/2021 2:06:33 PM   Radiology CT ABDOMEN PELVIS WO CONTRAST  Result Date: 02/03/2021 CLINICAL DATA:  Bowel obstruction suspected. Chronic abdominal pain and swelling. EXAM: CT ABDOMEN AND PELVIS WITHOUT CONTRAST TECHNIQUE: Multidetector CT imaging of the abdomen and pelvis was performed following the standard protocol without IV contrast. COMPARISON:  CT dated November 29, 2020 FINDINGS: Lower chest: The lung bases are clear. The heart size is normal. Hepatobiliary: The liver is normal. Normal gallbladder.There is no biliary ductal dilation. Pancreas: There is an unchanged 1.2 cm nodule involving the tail the pancreas, likely benign. Spleen: Unremarkable. Adrenals/Urinary Tract: --Adrenal glands: Unremarkable. --Right kidney/ureter: No hydronephrosis or radiopaque kidney stones. --Left  kidney/ureter: The left kidney is absent. --Urinary bladder: Unremarkable. Stomach/Bowel: --Stomach/Duodenum: No hiatal hernia or other gastric abnormality. Normal duodenal course and caliber. --Small bowel: Unremarkable. --Colon: Unremarkable. --Appendix: Normal. Vascular/Lymphatic: Atherosclerotic calcification is present within the non-aneurysmal abdominal aorta, without hemodynamically significant stenosis. --No  retroperitoneal lymphadenopathy. --No mesenteric lymphadenopathy. --No pelvic or inguinal lymphadenopathy. Reproductive: Unremarkable Other: No ascites or free air. Again noted are complex left abdominal wall hernias. The largest contains both peritoneal fat in addition to loops of colon and small bowel. There are additional hernias along the patient's left flank containing both fat and portions of the splenic flexure of the colon. There is no evidence for an obstruction. Musculoskeletal. No acute displaced fractures. IMPRESSION: 1. No acute abnormality. 2. Chronic findings as detailed above including large complex left abdominal wall and left flank hernias containing loops of bowel without evidence for obstruction. These are not substantially changed from the patient's prior CT. Aortic Atherosclerosis (ICD10-I70.0). Electronically Signed   By: Constance Holster M.D.   On: 02/03/2021 15:04    Procedures Procedures   Medications Ordered in ED Medications - No data to display  ED Course  I have reviewed the triage vital signs and the nursing notes.  Pertinent labs & imaging results that were available during my care of the patient were reviewed by me and considered in my medical decision making (see chart for details).    MDM Rules/Calculators/A&P                          Labs and imaging reviewed and discussed with pt and dg at bedside. Labs are stable, creatinine elevated but chronic, cbg also elevated, no dka, normal gap. Bacteruria without any c/o dysuria or other urinary complaint.   Also with slight lipase elevation of unclear etiology, no upper abdominal pain on re-exam, negative Murphy, CT imaging not suggestive of pancreatic disease, stable benign pancreatic nodule, gallbladder negative findings. No incarcerated hernia presentation.  Discussed hernia concerns seen with imaging.  Pt and dg endorses she has had several general surgery consults regarding this finding and she is not a surgical candidate. Thankfully no sign of incarceration with todays exam or CT.  She was feeling much better with reduced abdominal distention at time of dc. States she passed flatus which helped her feel better.  Advised close watch on her diet and cbg's, recheck by pcp this week.  The patient appears reasonably screened and/or stabilized for discharge and I doubt any other medical condition or other [facility] requiring further screening, evaluation, or treatment in the ED at this time prior to discharge.   Final Clinical Impression(s) / ED Diagnoses Final diagnoses:  Abdominal distention  Ventral hernia without obstruction or gangrene    Rx / DC Orders ED Discharge Orders    None       Landis Martins 02/05/21 1107    Fredia Sorrow, MD 02/20/21 [phone]

## 2021-02-03 NOTE — ED Triage Notes (Signed)
Pt with abd swelling since this morning.  +nausea, passing gas this morning. Shooting pain to left chest this morning, denies at present.  Pale per family member.  Pt states abd swelling is little better.

## 2021-02-03 NOTE — Discharge Instructions (Addendum)
Your labs and CT scan is reassuring tonight.  As discussed, you do have extensive hernias in your abdomen, but no sign of need for emergent surgical intervention.

## 2021-02-05 ENCOUNTER — Encounter: Payer: Self-pay | Admitting: Nurse Practitioner

## 2021-02-05 ENCOUNTER — Telehealth (INDEPENDENT_AMBULATORY_CARE_PROVIDER_SITE_OTHER): Payer: Medicare Other | Admitting: Nurse Practitioner

## 2021-02-05 ENCOUNTER — Other Ambulatory Visit: Payer: Self-pay

## 2021-02-05 DIAGNOSIS — E1121 Type 2 diabetes mellitus with diabetic nephropathy: Secondary | ICD-10-CM

## 2021-02-05 MED ORDER — GLIPIZIDE ER 5 MG PO TB24: 5.0000 mg | ORAL_TABLET | Freq: Every day | ORAL | 1 refills | Status: DC

## 2021-02-05 NOTE — Assessment & Plan Note (Addendum)
-  blood sugar has been elevated and is >200 every morning -she adamantly refuses insulin -Rx. Glipizide -currently taking metformin and linagliptin -she has not been referred to endocrinology in the past -treating blood sugar to prevent gastroparesis  -she states that she is clueless about how diabetes works and doesn't know what to eat; referral to endocrinology and she would benefit from DM educator

## 2021-02-05 NOTE — Progress Notes (Signed)
Acute Office Visit  Subjective:    Patient ID: [name] [name], female    DOB: [birth year], 80 y.o.   MRN: [id number]  Chief Complaint  Patient presents with  . Hospitalization Follow-up    Pt woke up Sunday morning feeling swollen in her abdomen, and nauseated. Went to the ER, and was discharged the same day. Pt had a lot of reflux coming up in her throat and going back down.     HPI Patient is in today for ED follow-up for abdominal distention.  She states that she feels much better today, but her stomach was hurting on her right side yesterday.  Her blood sugar this morning was 216 and was 209 yesterday morning.  Past Medical History:  Diagnosis Date  . Anxiety   . CAD (coronary artery disease)    Multivessel status post CABG June 2021 - LIMA to LAD, SVG to distal RCA, SVG to OM1  . Chronic obstructive pulmonary disease, unspecified ([place])   . Depression   . Essential hypertension   . GERD   . History of pneumonia   . Incarcerated ventral hernia   . Osteoarthritis   . Type 2 diabetes mellitus ([place])     Past Surgical History:  Procedure Laterality Date  . CORONARY ARTERY BYPASS GRAFT N/A 05/22/2020   Procedure: CORONARY ARTERY BYPASS GRAFTING (CABG) x3 LIMA TO LAD, SVG TO OM1, SVG TO DISTAL RIGHT;  Surgeon: Lajuana Matte, MD;  Location: [facility];  Service: Open Heart Surgery;  Laterality: N/A;  . LEFT HEART CATH AND CORONARY ANGIOGRAPHY N/A 05/21/2020   Procedure: LEFT HEART CATH AND CORONARY ANGIOGRAPHY;  Surgeon: Martinique, Peter M, MD;  Location: [facility];  Service: Cardiovascular;  Laterality: N/A;  . NEPHRECTOMY    . TEE WITHOUT CARDIOVERSION N/A 05/22/2020   Procedure: TRANSESOPHAGEAL ECHOCARDIOGRAM (TEE);  Surgeon: Lajuana Matte, MD;  Location: [facility];  Service: Open Heart Surgery;  Laterality: N/A;  . VEIN HARVEST Right 05/22/2020   Procedure: Open Vein Harvest;  Surgeon: Lajuana Matte, MD;  Location: [facility];  Service: Open Heart Surgery;  Laterality:  Right;  . VENTRAL HERNIA REPAIR  09/01/2017   mistaken entry    Family History  Problem Relation Age of Onset  . CAD Father   . Heart failure Sister     Social History   Socioeconomic History  . Marital status: Widowed    Spouse name: Not on file  . Number of children: 1  . Years of education: 67  . Highest education level: 12th grade  Occupational History  . Occupation: Retired  Tobacco Use  . Smoking status: Never Smoker  . Smokeless tobacco: Never Used  Vaping Use  . Vaping Use: Never used  Substance and Sexual Activity  . Alcohol use: No  . Drug use: No  . Sexual activity: Never  Other Topics Concern  . Not on file  Social History Narrative   Lives alone.  Has 1 daughter      Enjoys reading and cooking      Eats all food groups   Caffeine, some coffee   Water reports drinking pretty well throughout the day      Social Determinants of Health   Financial Resource Strain: Low Risk   . Difficulty of Paying Living Expenses: Not hard at all  Food Insecurity: No Food Insecurity  . Worried About Charity fundraiser in the Last Year: Never true  . Ran Out of Food in  the Last Year: Never true  Transportation Needs: No Transportation Needs  . Lack of Transportation (Medical): No  . Lack of Transportation (Non-Medical): No  Physical Activity: Inactive  . Days of Exercise per Week: 0 days  . Minutes of Exercise per Session: 0 min  Stress: No Stress Concern Present  . Feeling of Stress : Only a little  Social Connections: Socially Isolated  . Frequency of Communication with Friends and Family: Twice a week  . Frequency of Social Gatherings with Friends and Family: Once a week  . Attends Religious Services: Never  . Active Member of Clubs or Organizations: No  . Attends Archivist Meetings: Never  . Marital Status: Widowed  Intimate Partner Violence: Not At Risk  . Fear of Current or Ex-Partner: No  . Emotionally Abused: No  . Physically Abused: No   . Sexually Abused: No    Outpatient Medications Prior to Visit  Medication Sig Dispense Refill  . aspirin EC 81 MG tablet Take 1 tablet (81 mg total) by mouth daily. Swallow whole. 90 tablet 3  . atorvastatin (LIPITOR) 40 MG tablet TAKE 1 TABLET BY MOUTH AT BEDTIME. 90 tablet 1  . Biotin w/ Vitamins C & E (HAIR SKIN & NAILS GUMMIES PO) Take 2 each by mouth every morning.    . blood glucose meter kit and supplies Dispense based on patient and insurance preference. Use up to four times daily as directed. (FOR ICD-10 E10.9, E11.9). 1 each 0  . Cetirizine HCl 10 MG CAPS Take by mouth.    . fish oil-omega-3 fatty acids 1000 MG capsule Take 1 g by mouth 2 (two) times daily.    Marland Kitchen linagliptin (TRADJENTA) 5 MG TABS tablet Take 1 tablet (5 mg total) by mouth daily. 30 tablet 3  . metFORMIN (GLUCOPHAGE) 500 MG tablet Take 1 tablet (500 mg total) by mouth 2 (two) times daily with a meal. 180 tablet 3  . metoprolol tartrate (LOPRESSOR) 25 MG tablet TAKE (1/2) TABLET BY MOUTH 2 TIMES A DAY. (Patient taking differently: Take 12.5 mg by mouth 2 (two) times daily.) 90 tablet 1  . Multiple Vitamin (MULITIVITAMIN WITH MINERALS) TABS Take 1 tablet by mouth daily.    . Multiple Vitamins-Minerals (AIRBORNE GUMMIES PO) Take 1 each by mouth every morning.    . traMADol (ULTRAM) 50 MG tablet Take by mouth every 6 (six) hours as needed.    . nitroGLYCERIN (NITROSTAT) 0.4 MG SL tablet Place 1 tablet (0.4 mg total) under the tongue every 5 (five) minutes x 3 doses as needed for chest pain (if no relief after 3rd dose, proceed to the ED for an evaluation or call 911). 25 tablet 3   No facility-administered medications prior to visit.    Allergies  Allergen Reactions  . Contrast Media [Iodinated Diagnostic Agents]     Unknown reaction. Due to kidney issues. Do not use  . Codeine Rash  . Morphine And Related   . Other     Pt has multiple allergies per pt but unsure of all.   . Penicillins     Unknown reaction     Review of Systems  Constitutional: Negative.   Respiratory: Negative.   Cardiovascular: Negative.   Gastrointestinal: Positive for abdominal pain.       Much better today       Objective:    Physical Exam  There were no vitals taken for this visit. Wt Readings from Last 3 Encounters:  02/03/21 159 lb (72.1  kg)  12/25/20 160 lb (72.6 kg)  12/25/20 158 lb (71.7 kg)    Health Maintenance Due  Topic Date Due  . Hepatitis C Screening  Never done  . COVID-19 Vaccine (1) Never done  . OPHTHALMOLOGY EXAM  Never done  . TETANUS/TDAP  Never done  . DEXA SCAN  Never done  . URINE MICROALBUMIN  03/07/2021    There are no preventive care reminders to display for this patient.   Lab Results  Component Value Date   TSH 2.02 11/17/2019   Lab Results  Component Value Date   WBC 8.1 02/03/2021   HGB 13.1 02/03/2021   HCT 39.5 02/03/2021   MCV 94.7 02/03/2021   PLT 274 02/03/2021   Lab Results  Component Value Date   NA 136 02/03/2021   K 4.7 02/03/2021   CO2 26 02/03/2021   GLUCOSE 321 (H) 02/03/2021   BUN 28 (H) 02/03/2021   CREATININE 1.16 (H) 02/03/2021   BILITOT 0.3 02/03/2021   ALKPHOS 54 02/03/2021   AST 20 02/03/2021   ALT 31 02/03/2021   PROT 7.1 02/03/2021   ALBUMIN 3.7 02/03/2021   CALCIUM 9.1 02/03/2021   ANIONGAP 7 02/03/2021   Lab Results  Component Value Date   CHOL 159 05/18/2020   Lab Results  Component Value Date   HDL 29 (L) 05/18/2020   Lab Results  Component Value Date   LDLCALC 73 05/18/2020   Lab Results  Component Value Date   TRIG 286 (H) 05/18/2020   Lab Results  Component Value Date   CHOLHDL 5.5 05/18/2020   Lab Results  Component Value Date   HGBA1C 11.5 (A) 11/27/2020       Assessment & Plan:   Problem List Items Addressed This Visit      Endocrine   Type 2 diabetes mellitus with diabetic nephropathy ([place])    -blood sugar has been elevated and is >200 every morning -she adamantly refuses insulin -Rx.  Glipizide -currently taking metformin and linagliptin -she has not been referred to endocrinology in the past -treating blood sugar to prevent gastroparesis       Relevant Medications   glipiZIDE (GLUCOTROL XL) 5 MG 24 hr tablet       Meds ordered this encounter  Medications  . glipiZIDE (GLUCOTROL XL) 5 MG 24 hr tablet    Sig: Take 1 tablet (5 mg total) by mouth daily with breakfast.    Dispense:  30 tablet    Refill:  1   Date:  02/05/2021   Location of Patient: Home Location of Provider: Office Consent was obtain for visit to be over via telehealth. I verified that I am speaking with the correct person using two identifiers.  I connected with  [name] on 02/05/21 via telephone and verified that I am speaking with the correct person using two identifiers.   I discussed the limitations of evaluation and management by telemedicine. The patient expressed understanding and agreed to proceed.  Time spent: 25 minutes   [name], NP

## 2021-02-05 NOTE — Addendum Note (Signed)
Addended by: Bjorn Pippin on: 02/05/2021 11:28 AM   Modules accepted: Orders

## 2021-02-12 ENCOUNTER — Ambulatory Visit (INDEPENDENT_AMBULATORY_CARE_PROVIDER_SITE_OTHER): Payer: Medicare Other | Admitting: Nurse Practitioner

## 2021-02-12 ENCOUNTER — Encounter: Payer: Self-pay | Admitting: Nurse Practitioner

## 2021-02-12 ENCOUNTER — Other Ambulatory Visit: Payer: Self-pay

## 2021-02-12 VITALS — BP 143/79 | HR 87 | Ht 59.0 in | Wt 161.0 lb

## 2021-02-12 DIAGNOSIS — E1121 Type 2 diabetes mellitus with diabetic nephropathy: Secondary | ICD-10-CM | POA: Diagnosis not present

## 2021-02-12 DIAGNOSIS — E782 Mixed hyperlipidemia: Secondary | ICD-10-CM

## 2021-02-12 DIAGNOSIS — Z905 Acquired absence of kidney: Secondary | ICD-10-CM | POA: Diagnosis not present

## 2021-02-12 DIAGNOSIS — I10 Essential (primary) hypertension: Secondary | ICD-10-CM

## 2021-02-12 MED ORDER — ACCU-CHEK GUIDE VI STRP: ORAL_STRIP | 12 refills | Status: DC

## 2021-02-12 MED ORDER — ACCU-CHEK SOFTCLIX LANCETS MISC: 12 refills | Status: AC

## 2021-02-12 NOTE — Progress Notes (Signed)
Endocrinology Consult Note       02/12/2021, 12:00 PM   Subjective:    Patient ID: [name] [name], female    DOB: 02-09-1941.  [name] [name] is being seen in consultation for management of currently uncontrolled symptomatic diabetes requested by  Perlie Mayo, NP.   Past Medical History:  Diagnosis Date  . Anxiety   . CAD (coronary artery disease)    Multivessel status post CABG June 2021 - LIMA to LAD, SVG to distal RCA, SVG to OM1  . Chronic obstructive pulmonary disease, unspecified ([place])   . Depression   . Essential hypertension   . GERD   . History of pneumonia   . Incarcerated ventral hernia   . Osteoarthritis   . Type 2 diabetes mellitus ([place])     Past Surgical History:  Procedure Laterality Date  . CORONARY ARTERY BYPASS GRAFT N/A 05/22/2020   Procedure: CORONARY ARTERY BYPASS GRAFTING (CABG) x3 LIMA TO LAD, SVG TO OM1, SVG TO DISTAL RIGHT;  Surgeon: Lajuana Matte, MD;  Location: [place];  Service: Open Heart Surgery;  Laterality: N/A;  . LEFT HEART CATH AND CORONARY ANGIOGRAPHY N/A 05/21/2020   Procedure: LEFT HEART CATH AND CORONARY ANGIOGRAPHY;  Surgeon: Martinique, Peter M, MD;  Location: [facility];  Service: Cardiovascular;  Laterality: N/A;  . NEPHRECTOMY    . TEE WITHOUT CARDIOVERSION N/A 05/22/2020   Procedure: TRANSESOPHAGEAL ECHOCARDIOGRAM (TEE);  Surgeon: Lajuana Matte, MD;  Location: [place] [facility];  Service: Open Heart Surgery;  Laterality: N/A;  . VEIN HARVEST Right 05/22/2020   Procedure: Open Vein Harvest;  Surgeon: Lajuana Matte, MD;  Location: [facility];  Service: Open Heart Surgery;  Laterality: Right;  . VENTRAL HERNIA REPAIR  09/01/2017   mistaken entry    Social History   Socioeconomic History  . Marital status: Widowed    Spouse name: Not on file  . Number of children: 1  . Years of education: 8  . Highest education level: 12th grade  Occupational History  .  Occupation: Retired  Tobacco Use  . Smoking status: Never Smoker  . Smokeless tobacco: Never Used  Vaping Use  . Vaping Use: Never used  Substance and Sexual Activity  . Alcohol use: No  . Drug use: No  . Sexual activity: Never  Other Topics Concern  . Not on file  Social History Narrative   Lives alone.  Has 1 daughter      Enjoys reading and cooking      Eats all food groups   Caffeine, some coffee   Water reports drinking pretty well throughout the day      Social Determinants of Health   Financial Resource Strain: Low Risk   . Difficulty of Paying Living Expenses: Not hard at all  Food Insecurity: No Food Insecurity  . Worried About Charity fundraiser in the Last Year: Never true  . Ran Out of Food in the Last Year: Never true  Transportation Needs: No Transportation Needs  . Lack of Transportation (Medical): No  . Lack of Transportation (Non-Medical): No  Physical Activity: Inactive  . Days of Exercise per Week: 0 days  .  Minutes of Exercise per Session: 0 min  Stress: No Stress Concern Present  . Feeling of Stress : Only a little  Social Connections: Socially Isolated  . Frequency of Communication with Friends and Family: Twice a week  . Frequency of Social Gatherings with Friends and Family: Once a week  . Attends Religious Services: Never  . Active Member of Clubs or Organizations: No  . Attends Archivist Meetings: Never  . Marital Status: Widowed    Family History  Problem Relation Age of Onset  . CAD Father   . Heart failure Sister     Outpatient Encounter Medications as of 02/12/2021  Medication Sig  . Accu-Chek Softclix Lancets lancets Use as instructed  . aspirin EC 81 MG tablet Take 1 tablet (81 mg total) by mouth daily. Swallow whole.  Marland Kitchen atorvastatin (LIPITOR) 40 MG tablet TAKE 1 TABLET BY MOUTH AT BEDTIME.  Marland Kitchen Biotin w/ Vitamins C & E (HAIR SKIN & NAILS GUMMIES PO) Take 2 each by mouth every morning.  . blood glucose meter kit and  supplies Dispense based on patient and insurance preference. Use up to four times daily as directed. (FOR ICD-10 E10.9, E11.9).  Marland Kitchen Cetirizine HCl 10 MG CAPS Take by mouth.  . fish oil-omega-3 fatty acids 1000 MG capsule Take 1 g by mouth 2 (two) times daily.  Marland Kitchen glipiZIDE (GLUCOTROL XL) 5 MG 24 hr tablet Take 1 tablet (5 mg total) by mouth daily with breakfast.  . glucose blood (ACCU-CHEK GUIDE) test strip Use as instructed to monitor glucose twice daily  . linagliptin (TRADJENTA) 5 MG TABS tablet Take 1 tablet (5 mg total) by mouth daily.  . metFORMIN (GLUCOPHAGE) 500 MG tablet Take 1 tablet (500 mg total) by mouth 2 (two) times daily with a meal.  . metoprolol tartrate (LOPRESSOR) 25 MG tablet TAKE (1/2) TABLET BY MOUTH 2 TIMES A DAY. (Patient taking differently: Take 12.5 mg by mouth 2 (two) times daily.)  . Multiple Vitamin (MULITIVITAMIN WITH MINERALS) TABS Take 1 tablet by mouth daily.  . Multiple Vitamins-Minerals (AIRBORNE GUMMIES PO) Take 1 each by mouth every morning.  . nitroGLYCERIN (NITROSTAT) 0.4 MG SL tablet Place 1 tablet (0.4 mg total) under the tongue every 5 (five) minutes x 3 doses as needed for chest pain (if no relief after 3rd dose, proceed to the ED for an evaluation or call 911).  . polyethylene glycol (MIRALAX / GLYCOLAX) 17 g packet Take 17 g by mouth daily as needed.  . [DISCONTINUED] traMADol (ULTRAM) 50 MG tablet Take by mouth every 6 (six) hours as needed.   No facility-administered encounter medications on file as of 02/12/2021.    ALLERGIES: Allergies  Allergen Reactions  . Contrast Media [Iodinated Diagnostic Agents]     Unknown reaction. Due to kidney issues. Do not use  . Codeine Rash  . Morphine And Related   . Other     Pt has multiple allergies per pt but unsure of all.   . Penicillins     Unknown reaction    VACCINATION STATUS: Immunization History  Administered Date(s) Administered  . Influenza, High Dose Seasonal PF 11/12/2017, 08/13/2018  .  Influenza-Unspecified 11/12/2011, 11/03/2012, 11/02/2013, 09/20/2014, 09/19/2015, 09/23/2016, 08/18/2019  . Pneumococcal Conjugate-13 09/20/2014  . Pneumococcal Polysaccharide-23 03/18/2016    Diabetes She presents for her initial diabetic visit. She has type 2 diabetes mellitus. Onset time: Was diagnosed at approx age of 50. Her disease course has been worsening. There are no hypoglycemic associated symptoms.  Associated symptoms include blurred vision, fatigue, polydipsia and polyuria. There are no hypoglycemic complications. Symptoms are stable. Diabetic complications include heart disease and nephropathy. Risk factors for coronary artery disease include diabetes mellitus, dyslipidemia, hypertension, stress, tobacco exposure, sedentary lifestyle and post-menopausal. Current diabetic treatment includes oral agent (triple therapy). She is compliant with treatment most of the time. Her weight is stable. She is following a generally unhealthy diet. When asked about meal planning, she reported none. She has not had a previous visit with a dietitian. She rarely participates in exercise. Her home blood glucose trend is decreasing steadily. Her breakfast blood glucose range is generally 110-130 mg/dl. (She presents today for her consultation, accompanied by her daughter, with logs, no meter to review.  Her fasting glycemic profile is controlled with levels between 101-138.  She says her glucose has not been controlled since she had open heart surgery in June 2021.  She admits to the consumption of sugary foods and beverages.  Her most recent A1c was 11.5% on 11/27/20, the highest it has ever been.  She does not engage in routine physical activity.  She denies any s/s of hypoglycemia.) An ACE inhibitor/angiotensin II receptor blocker is not being taken. She does not see a podiatrist.Eye exam is current.  Hyperlipidemia This is a chronic problem. The current episode started more than 1 year ago. The problem is  uncontrolled. Recent lipid tests were reviewed and are variable. Exacerbating diseases include chronic renal disease and diabetes. Factors aggravating her hyperlipidemia include beta blockers and fatty foods. Current antihyperlipidemic treatment includes statins. The current treatment provides mild improvement of lipids. Compliance problems include adherence to diet and adherence to exercise.  Risk factors for coronary artery disease include diabetes mellitus, dyslipidemia, hypertension, post-menopausal, a sedentary lifestyle and stress.  Hypertension This is a chronic problem. The current episode started more than 1 year ago. The problem has been waxing and waning since onset. The problem is controlled. Associated symptoms include blurred vision. There are no associated agents to hypertension. Risk factors for coronary artery disease include diabetes mellitus, dyslipidemia, sedentary lifestyle, smoking/tobacco exposure, stress and post-menopausal state. Past treatments include beta blockers. The current treatment provides mild improvement. There are no compliance problems.  Hypertensive end-organ damage includes kidney disease and CAD/MI. Identifiable causes of hypertension include chronic renal disease.     Review of systems  Constitutional: + Minimally fluctuating body weight,  current Body mass index is 32.52 kg/m. , + fatigue, no subjective hyperthermia, no subjective hypothermia Eyes: + blurry vision, no xerophthalmia ENT: no sore throat, no nodules palpated in throat, no dysphagia/odynophagia, no hoarseness Cardiovascular: no chest pain, no shortness of breath, no palpitations, +intermittent leg swelling Respiratory: no cough, no shortness of breath Gastrointestinal: no nausea/vomiting/diarrhea Musculoskeletal: no muscle/joint aches Skin: no rashes, no hyperemia Neurological: no tremors, no numbness, no tingling, no dizziness Psychiatric: no depression, no anxiety  Objective:     BP (!)  143/79 (BP Location: Right Arm, Patient Position: Sitting)   Pulse 87   Ht _0  (1.499 m)   Wt 161 lb (73 kg)   BMI 32.52 kg/m   Wt Readings from Last 3 Encounters:  02/12/21 161 lb (73 kg)  02/03/21 159 lb (72.1 kg)  12/25/20 160 lb (72.6 kg)     BP Readings from Last 3 Encounters:  02/12/21 (!) 143/79  02/03/21 (!) 116/54  12/25/20 108/62     Physical Exam- Limited  Constitutional:  Body mass index is 32.52 kg/m. , not in acute distress,  anxious state of mind Eyes:  EOMI, no exophthalmos Neck: Supple Cardiovascular: RRR, no murmers, rubs, or gallops, no edema Respiratory: Adequate breathing efforts, no crackles, rales, rhonchi, or wheezing Musculoskeletal: no gross deformities, strength intact in all four extremities, no gross restriction of joint movements Skin:  no rashes, no hyperemia Neurological: no tremor with outstretched hands    CMP ( most recent) CMP     Component Value Date/Time   NA 136 02/03/2021 1420   NA 138 12/28/2020 0955   K 4.7 02/03/2021 1420   CL 103 02/03/2021 1420   CO2 26 02/03/2021 1420   GLUCOSE 321 (H) 02/03/2021 1420   BUN 28 (H) 02/03/2021 1420   BUN 19 12/28/2020 0955   CREATININE 1.16 (H) 02/03/2021 1420   CREATININE 1.28 (H) 03/07/2020 1137   CALCIUM 9.1 02/03/2021 1420   PROT 7.1 02/03/2021 1420   PROT 7.1 12/28/2020 0955   ALBUMIN 3.7 02/03/2021 1420   ALBUMIN 4.2 12/28/2020 0955   AST 20 02/03/2021 1420   ALT 31 02/03/2021 1420   ALKPHOS 54 02/03/2021 1420   BILITOT 0.3 02/03/2021 1420   BILITOT 0.4 12/28/2020 0955   GFRNONAA 48 (L) 02/03/2021 1420   GFRNONAA 40 (L) 03/07/2020 1137   GFRAA 48 (L) 12/28/2020 0955   GFRAA 46 (L) 03/07/2020 1137     Diabetic Labs (most recent): Lab Results  Component Value Date   HGBA1C 11.5 (A) 11/27/2020   HGBA1C 7.4 (H) 05/21/2020   HGBA1C 6.8 (H) 03/07/2020     Lipid Panel ( most recent) Lipid Panel     Component Value Date/Time   CHOL 159 05/18/2020 0341   TRIG 286  (H) 05/18/2020 0341   HDL 29 (L) 05/18/2020 0341   CHOLHDL 5.5 05/18/2020 0341   VLDL 57 (H) 05/18/2020 0341   LDLCALC 73 05/18/2020 0341   LDLCALC 109 (H) 03/07/2020 1137      Lab Results  Component Value Date   TSH 2.02 11/17/2019           Assessment & Plan:   1) Uncontrolled Type 2 Diabetes with CKD stage 3  - [name] [name] has currently uncontrolled symptomatic type 2 DM since 80 years of age, with most recent A1c of 11.5 %.   She presents today for her consultation, accompanied by her daughter, with logs, no meter to review.  Her fasting glycemic profile is controlled with levels between 101-138.  She says her glucose has not been controlled since she had open heart surgery in June 2021.  She admits to the consumption of sugary foods and beverages.  Her most recent A1c was 11.5% on 11/27/20, the highest it has ever been.  She does not engage in routine physical activity.  She denies any s/s of hypoglycemia.  -Recent labs reviewed.  - I had a long discussion with her about the progressive nature of diabetes and the pathology behind its complications. -her diabetes is complicated by CAD and she remains at a high risk for more acute and chronic complications which include CAD, CVA, CKD, retinopathy, and neuropathy. These are all discussed in detail with her.  - I have counseled her on diet  and weight management by adopting a carbohydrate restricted/protein rich diet. Patient is encouraged to switch to unprocessed or minimally processed complex starch and increased protein intake (animal or plant source), fruits, and vegetables. -  she is advised to stick to a routine mealtimes to eat 3 meals a day and avoid unnecessary snacks (to snack  only to correct hypoglycemia).   - she acknowledges that there is a room for improvement in her food and drink choices. - Suggestion is made for her to avoid simple carbohydrates  from her diet including Cakes, Sweet Desserts, Ice Cream, Soda  (diet and regular), Sweet Tea, Candies, Chips, Cookies, Store Bought Juices, Alcohol in Excess of  1-2 drinks a day, Artificial Sweeteners, Coffee Creamer, and "Sugar-free" Products. This will help patient to have more stable blood glucose profile and potentially avoid unintended weight gain.  - she will be scheduled with Jearld Fenton, RDN, CDE for diabetes education.  - I have approached her with the following individualized plan to manage  her diabetes and patient agrees:   -She is advised to continue her medications Metformin 500 mg po twice daily with meals, Glipizide 5 mg XL daily with breakfast, and Tradjenta 5 mg po daily for now.  -she is encouraged to start monitoring glucose 4 times daily, before meals and before bed, to log their readings on the clinic sheets provided, and bring them to review at follow up appointment in 2 weeks.  - she is encouraged to call clinic for blood glucose levels less than 70 or above 300 mg /dl.  - Specific targets for  A1c;  LDL, HDL,  and Triglycerides were discussed with the patient.  2) Blood Pressure /Hypertension:  her blood pressure is controlled to target for her age.   she is advised to continue her current medications including Metoprolol 12.5 mg po twice daily.  3) Lipids/Hyperlipidemia:    Review of her recent lipid panel from 05/18/20 showed controlled  LDL at 73 and elevated triglycerides of 286 .  she  is advised to continue Lipitor 40 mg daily at bedtime and Fish Oil supplement.  Side effects and precautions discussed with her.  4)  Weight/Diet:  her Body mass index is 32.52 kg/m.  -  clearly complicating her diabetes care.   she is a candidate for weight loss. I discussed with her the fact that loss of 5 - 10% of her  current body weight will have the most impact on her diabetes management.  Exercise, and detailed carbohydrates information provided  -  detailed on discharge instructions.  5) Chronic Care/Health Maintenance: -she is on  Statin medications and is encouraged to initiate and continue to follow up with Ophthalmology, Dentist, Podiatrist at least yearly or according to recommendations, and advised to stay away from smoking. I have recommended yearly flu vaccine and pneumonia vaccine at least every 5 years; moderate intensity exercise for up to 150 minutes weekly; and sleep for at least 7 hours a day.  - she is advised to maintain close follow up with Perlie Mayo, NP for primary care needs, as well as her other providers for optimal and coordinated care.   - Time spent in this patient care: 60 min, of which > 50% was spent in counseling her about her diabetes and the rest reviewing her blood glucose logs, discussing her hypoglycemia and hyperglycemia episodes, reviewing her current and previous labs/studies (including abstraction from other facilities) and medications doses and developing a long term treatment plan based on the latest standards of care/guidelines; and documenting her care.    Please refer to Patient Instructions for Blood Glucose Monitoring and Insulin/Medications Dosing Guide" in media tab for additional information. Please also refer to "Patient Self Inventory" in the Media tab for reviewed elements of pertinent patient history.  [name] [name] participated in the discussions,  expressed understanding, and voiced agreement with the above plans.  All questions were answered to her satisfaction. she is encouraged to contact clinic should she have any questions or concerns prior to her return visit.   Follow up plan: - Return in about 2 weeks (around 02/26/2021) for Diabetes follow up, Bring glucometer and logs, ABI next visit.  Rayetta Pigg, [facility] [address] [place], [place] [address] Phone: [phone] Fax: [phone]  02/12/2021, 12:00 PM

## 2021-02-12 NOTE — Patient Instructions (Signed)

## 2021-02-13 ENCOUNTER — Telehealth: Payer: Self-pay | Admitting: Nurse Practitioner

## 2021-02-13 NOTE — Telephone Encounter (Signed)
Returned call to patient, she stated that the insurance does not cover for the Guide me, she will use the meter she already has.

## 2021-02-13 NOTE — Telephone Encounter (Signed)
Pt was seen as a new patient yesterday and is requesting a call back from [place] or her nurse

## 2021-02-27 ENCOUNTER — Other Ambulatory Visit: Payer: Self-pay

## 2021-02-27 ENCOUNTER — Encounter: Payer: Self-pay | Admitting: Nurse Practitioner

## 2021-02-27 ENCOUNTER — Ambulatory Visit (INDEPENDENT_AMBULATORY_CARE_PROVIDER_SITE_OTHER): Payer: Medicare Other | Admitting: Nurse Practitioner

## 2021-02-27 ENCOUNTER — Ambulatory Visit: Payer: Medicare Other | Admitting: Family Medicine

## 2021-02-27 VITALS — BP 121/59 | HR 71 | Ht 59.0 in | Wt 161.4 lb

## 2021-02-27 DIAGNOSIS — Z905 Acquired absence of kidney: Secondary | ICD-10-CM

## 2021-02-27 DIAGNOSIS — E782 Mixed hyperlipidemia: Secondary | ICD-10-CM

## 2021-02-27 DIAGNOSIS — E1121 Type 2 diabetes mellitus with diabetic nephropathy: Secondary | ICD-10-CM

## 2021-02-27 DIAGNOSIS — I10 Essential (primary) hypertension: Secondary | ICD-10-CM | POA: Diagnosis not present

## 2021-02-27 DIAGNOSIS — E559 Vitamin D deficiency, unspecified: Secondary | ICD-10-CM | POA: Diagnosis not present

## 2021-02-27 DIAGNOSIS — R6889 Other general symptoms and signs: Secondary | ICD-10-CM

## 2021-02-27 LAB — POCT GLYCOSYLATED HEMOGLOBIN (HGB A1C): HbA1c, POC (controlled diabetic range): 7.8 % — AB (ref 0.0–7.0)

## 2021-02-27 LAB — POCT UA - MICROALBUMIN: Microalbumin Ur, POC: 30 mg/L

## 2021-02-27 NOTE — Progress Notes (Addendum)
Endocrinology Follow Up Note       02/27/2021, 10:13 AM   Subjective:    Patient ID: [name] [name], female    DOB: 25-Feb-1941.  [name] [name] is being seen in follow up after being seen in consultation for management of currently uncontrolled symptomatic diabetes requested by  Perlie Mayo, NP.   Past Medical History:  Diagnosis Date  . Anxiety   . CAD (coronary artery disease)    Multivessel status post CABG June 2021 - LIMA to LAD, SVG to distal RCA, SVG to OM1  . Chronic obstructive pulmonary disease, unspecified ([place])   . Depression   . Essential hypertension   . GERD   . History of pneumonia   . Incarcerated ventral hernia   . Osteoarthritis   . Type 2 diabetes mellitus ([place])     Past Surgical History:  Procedure Laterality Date  . CORONARY ARTERY BYPASS GRAFT N/A 05/22/2020   Procedure: CORONARY ARTERY BYPASS GRAFTING (CABG) x3 LIMA TO LAD, SVG TO OM1, SVG TO DISTAL RIGHT;  Surgeon: Lajuana Matte, MD;  Location: [facility];  Service: Open Heart Surgery;  Laterality: N/A;  . LEFT HEART CATH AND CORONARY ANGIOGRAPHY N/A 05/21/2020   Procedure: LEFT HEART CATH AND CORONARY ANGIOGRAPHY;  Surgeon: Martinique, Peter M, MD;  Location: [facility];  Service: Cardiovascular;  Laterality: N/A;  . NEPHRECTOMY    . TEE WITHOUT CARDIOVERSION N/A 05/22/2020   Procedure: TRANSESOPHAGEAL ECHOCARDIOGRAM (TEE);  Surgeon: Lajuana Matte, MD;  Location: [facility];  Service: Open Heart Surgery;  Laterality: N/A;  . VEIN HARVEST Right 05/22/2020   Procedure: Open Vein Harvest;  Surgeon: Lajuana Matte, MD;  Location: [facility];  Service: Open Heart Surgery;  Laterality: Right;  . VENTRAL HERNIA REPAIR  09/01/2017   mistaken entry    Social History   Socioeconomic History  . Marital status: Widowed    Spouse name: Not on file  . Number of children: 1  . Years of education: 59  . Highest education level: 12th  grade  Occupational History  . Occupation: Retired  Tobacco Use  . Smoking status: Never Smoker  . Smokeless tobacco: Never Used  Vaping Use  . Vaping Use: Never used  Substance and Sexual Activity  . Alcohol use: No  . Drug use: No  . Sexual activity: Never  Other Topics Concern  . Not on file  Social History Narrative   Lives alone.  Has 1 daughter      Enjoys reading and cooking      Eats all food groups   Caffeine, some coffee   Water reports drinking pretty well throughout the day      Social Determinants of Health   Financial Resource Strain: Low Risk   . Difficulty of Paying Living Expenses: Not hard at all  Food Insecurity: No Food Insecurity  . Worried About Charity fundraiser in the Last Year: Never true  . Ran Out of Food in the Last Year: Never true  Transportation Needs: No Transportation Needs  . Lack of Transportation (Medical): No  . Lack of Transportation (Non-Medical): No  Physical Activity: Inactive  . Days of  Exercise per Week: 0 days  . Minutes of Exercise per Session: 0 min  Stress: No Stress Concern Present  . Feeling of Stress : Only a little  Social Connections: Socially Isolated  . Frequency of Communication with Friends and Family: Twice a week  . Frequency of Social Gatherings with Friends and Family: Once a week  . Attends Religious Services: Never  . Active Member of Clubs or Organizations: No  . Attends Archivist Meetings: Never  . Marital Status: Widowed    Family History  Problem Relation Age of Onset  . CAD Father   . Heart failure Sister     Outpatient Encounter Medications as of 02/27/2021  Medication Sig  . Accu-Chek Softclix Lancets lancets Use as instructed  . aspirin EC 81 MG tablet Take 1 tablet (81 mg total) by mouth daily. Swallow whole.  Marland Kitchen atorvastatin (LIPITOR) 40 MG tablet TAKE 1 TABLET BY MOUTH AT BEDTIME.  Marland Kitchen Biotin w/ Vitamins C & E (HAIR SKIN & NAILS GUMMIES PO) Take 2 each by mouth every morning.   . blood glucose meter kit and supplies Dispense based on patient and insurance preference. Use up to four times daily as directed. (FOR ICD-10 E10.9, E11.9).  Marland Kitchen Cetirizine HCl 10 MG CAPS Take by mouth.  . fish oil-omega-3 fatty acids 1000 MG capsule Take 1 g by mouth 2 (two) times daily.  Marland Kitchen glipiZIDE (GLUCOTROL XL) 5 MG 24 hr tablet Take 1 tablet (5 mg total) by mouth daily with breakfast.  . glucose blood (ACCU-CHEK GUIDE) test strip Use as instructed to monitor glucose twice daily  . linagliptin (TRADJENTA) 5 MG TABS tablet Take 1 tablet (5 mg total) by mouth daily.  . metFORMIN (GLUCOPHAGE) 500 MG tablet Take 1 tablet (500 mg total) by mouth 2 (two) times daily with a meal.  . metoprolol tartrate (LOPRESSOR) 25 MG tablet TAKE (1/2) TABLET BY MOUTH 2 TIMES A DAY. (Patient taking differently: Take 12.5 mg by mouth 2 (two) times daily.)  . Multiple Vitamin (MULITIVITAMIN WITH MINERALS) TABS Take 1 tablet by mouth daily.  . Multiple Vitamins-Minerals (AIRBORNE GUMMIES PO) Take 1 each by mouth every morning.  . polyethylene glycol (MIRALAX / GLYCOLAX) 17 g packet Take 17 g by mouth daily as needed.  . nitroGLYCERIN (NITROSTAT) 0.4 MG SL tablet Place 1 tablet (0.4 mg total) under the tongue every 5 (five) minutes x 3 doses as needed for chest pain (if no relief after 3rd dose, proceed to the ED for an evaluation or call 911).   No facility-administered encounter medications on file as of 02/27/2021.    ALLERGIES: Allergies  Allergen Reactions  . Contrast Media [Iodinated Diagnostic Agents]     Unknown reaction. Due to kidney issues. Do not use  . Codeine Rash  . Morphine And Related   . Other     Pt has multiple allergies per pt but unsure of all.   . Penicillins     Unknown reaction    VACCINATION STATUS: Immunization History  Administered Date(s) Administered  . Influenza, High Dose Seasonal PF 11/12/2017, 08/13/2018  . Influenza-Unspecified 11/12/2011, 11/03/2012, 11/02/2013,  09/20/2014, 09/19/2015, 09/23/2016, 08/18/2019  . Pneumococcal Conjugate-13 09/20/2014  . Pneumococcal Polysaccharide-23 03/18/2016    Diabetes She presents for her follow-up diabetic visit. She has type 2 diabetes mellitus. Onset time: Was diagnosed at approx age of 68. Her disease course has been improving. There are no hypoglycemic associated symptoms. Associated symptoms include blurred vision and foot paresthesias. Pertinent negatives  for diabetes include no fatigue, no polydipsia and no polyuria. There are no hypoglycemic complications. Symptoms are stable. Diabetic complications include heart disease, nephropathy and peripheral neuropathy. Risk factors for coronary artery disease include diabetes mellitus, dyslipidemia, hypertension, stress, tobacco exposure, sedentary lifestyle and post-menopausal. Current diabetic treatment includes oral agent (triple therapy). She is compliant with treatment most of the time. Her weight is stable. She is following a generally unhealthy diet. When asked about meal planning, she reported none. She has not had a previous visit with a dietitian. She rarely participates in exercise. Her home blood glucose trend is decreasing steadily. Her breakfast blood glucose range is generally 90-110 mg/dl. Her lunch blood glucose range is generally 130-140 mg/dl. Her dinner blood glucose range is generally 130-140 mg/dl. Her bedtime blood glucose range is generally 130-140 mg/dl. (She presents today, accompanied by her daughter, with her meter and logs showing stable and near goal glycemic profile overall.  Her POCT A1c today is 7.8%, drastically improved since last visit of 11.5%.  She states she has changed her diet and is avoiding sweets, mostly.  She denies any significant hypoglycemia.) An ACE inhibitor/angiotensin II receptor blocker is not being taken. She does not see a podiatrist.Eye exam is current.  Hyperlipidemia This is a chronic problem. The current episode started  more than 1 year ago. The problem is uncontrolled. Recent lipid tests were reviewed and are variable. Exacerbating diseases include chronic renal disease and diabetes. Factors aggravating her hyperlipidemia include beta blockers and fatty foods. Current antihyperlipidemic treatment includes statins. The current treatment provides mild improvement of lipids. Compliance problems include adherence to diet and adherence to exercise.  Risk factors for coronary artery disease include diabetes mellitus, dyslipidemia, hypertension, post-menopausal, a sedentary lifestyle and stress.  Hypertension This is a chronic problem. The current episode started more than 1 year ago. The problem has been waxing and waning since onset. The problem is controlled. Associated symptoms include blurred vision. There are no associated agents to hypertension. Risk factors for coronary artery disease include diabetes mellitus, dyslipidemia, sedentary lifestyle, smoking/tobacco exposure, stress and post-menopausal state. Past treatments include beta blockers. The current treatment provides mild improvement. There are no compliance problems.  Hypertensive end-organ damage includes kidney disease and CAD/MI. Identifiable causes of hypertension include chronic renal disease.     Review of systems  Constitutional: + Minimally fluctuating body weight,  current Body mass index is 32.6 kg/m. , + fatigue (improving), no subjective hyperthermia, no subjective hypothermia Eyes: + blurry vision, no xerophthalmia ENT: no sore throat, no nodules palpated in throat, no dysphagia/odynophagia, no hoarseness Cardiovascular: no chest pain, no shortness of breath, no palpitations, +intermittent leg swelling Respiratory: no cough, no shortness of breath Gastrointestinal: no nausea/vomiting/diarrhea Musculoskeletal: no muscle/joint aches Skin: no rashes, no hyperemia Neurological: no tremors, no numbness, no tingling, no dizziness Psychiatric: no  depression, no anxiety  Objective:     BP (!) 121/59 (BP Location: Left Arm)   Pulse 71   Ht _0  (1.499 m)   Wt 161 lb 6.4 oz (73.2 kg)   BMI 32.60 kg/m   Wt Readings from Last 3 Encounters:  02/27/21 161 lb 6.4 oz (73.2 kg)  02/12/21 161 lb (73 kg)  02/03/21 159 lb (72.1 kg)     BP Readings from Last 3 Encounters:  02/27/21 (!) 121/59  02/12/21 (!) 143/79  02/03/21 (!) 116/54     Physical Exam- Limited  Constitutional:  Body mass index is 32.6 kg/m. , not in acute distress, anxious  state of mind Eyes:  EOMI, no exophthalmos Neck: Supple Cardiovascular: RRR, no murmers, rubs, or gallops, no edema Respiratory: Adequate breathing efforts, no crackles, rales, rhonchi, or wheezing Musculoskeletal: no gross deformities, strength intact in all four extremities, no gross restriction of joint movements Skin:  no rashes, no hyperemia Neurological: no tremor with outstretched hands  POCT ABI Results 02/27/21   Right ABI:  PAD      Left ABI:  PAD  Right leg systolic / diastolic: PAD mmHg Left leg systolic / diastolic: PAD mmHg  Arm systolic / diastolic: 037/04 mmHG  Detailed report will be scanned into patient chart.  CMP ( most recent) CMP     Component Value Date/Time   NA 136 02/03/2021 1420   NA 138 12/28/2020 0955   K 4.7 02/03/2021 1420   CL 103 02/03/2021 1420   CO2 26 02/03/2021 1420   GLUCOSE 321 (H) 02/03/2021 1420   BUN 28 (H) 02/03/2021 1420   BUN 19 12/28/2020 0955   CREATININE 1.16 (H) 02/03/2021 1420   CREATININE 1.28 (H) 03/07/2020 1137   CALCIUM 9.1 02/03/2021 1420   PROT 7.1 02/03/2021 1420   PROT 7.1 12/28/2020 0955   ALBUMIN 3.7 02/03/2021 1420   ALBUMIN 4.2 12/28/2020 0955   AST 20 02/03/2021 1420   ALT 31 02/03/2021 1420   ALKPHOS 54 02/03/2021 1420   BILITOT 0.3 02/03/2021 1420   BILITOT 0.4 12/28/2020 0955   GFRNONAA 48 (L) 02/03/2021 1420   GFRNONAA 40 (L) 03/07/2020 1137   GFRAA 48 (L) 12/28/2020 0955   GFRAA 46 (L) 03/07/2020  1137     Diabetic Labs (most recent): Lab Results  Component Value Date   HGBA1C 7.8 (A) 02/27/2021   HGBA1C 11.5 (A) 11/27/2020   HGBA1C 7.4 (H) 05/21/2020     Lipid Panel ( most recent) Lipid Panel     Component Value Date/Time   CHOL 159 05/18/2020 0341   TRIG 286 (H) 05/18/2020 0341   HDL 29 (L) 05/18/2020 0341   CHOLHDL 5.5 05/18/2020 0341   VLDL 57 (H) 05/18/2020 0341   LDLCALC 73 05/18/2020 0341   LDLCALC 109 (H) 03/07/2020 1137      Lab Results  Component Value Date   TSH 2.02 11/17/2019           Assessment & Plan:   1) Uncontrolled Type 2 Diabetes with CKD stage 3  - [name] [name] has currently uncontrolled symptomatic type 2 DM since 80 years of age, with most recent A1c of 11.5 %.   She presents today, accompanied by her daughter, with her meter and logs showing stable and near goal glycemic profile overall.  Her POCT A1c today is 7.8%, drastically improved since last visit of 11.5%.  She states she has changed her diet and is avoiding sweets, mostly.  She denies any significant hypoglycemia.  -Recent labs reviewed.  - I had a long discussion with her about the progressive nature of diabetes and the pathology behind its complications. -her diabetes is complicated by CAD and she remains at a high risk for more acute and chronic complications which include CAD, CVA, CKD, retinopathy, and neuropathy. These are all discussed in detail with her.  - Nutritional counseling repeated at each appointment due to patients tendency to fall back in to old habits.  - The patient admits there is a room for improvement in their diet and drink choices. -  Suggestion is made for the patient to avoid simple carbohydrates from their diet  including Cakes, Sweet Desserts / Pastries, Ice Cream, Soda (diet and regular), Sweet Tea, Candies, Chips, Cookies, Sweet Pastries,  Store Bought Juices, Alcohol in Excess of  1-2 drinks a day, Artificial Sweeteners, Coffee Creamer, and  "Sugar-free" Products. This will help patient to have stable blood glucose profile and potentially avoid unintended weight gain.   - I encouraged the patient to switch to  unprocessed or minimally processed complex starch and increased protein intake (animal or plant source), fruits, and vegetables.   - Patient is advised to stick to a routine mealtimes to eat 3 meals  a day and avoid unnecessary snacks ( to snack only to correct hypoglycemia).  - she will be scheduled with Jearld Fenton, RDN, CDE for diabetes education.  - I have approached her with the following individualized plan to manage  her diabetes and patient agrees:   -She is advised to continue her medications Metformin 500 mg po twice daily with meals, Glipizide 5 mg XL daily with breakfast, and Tradjenta 5 mg po daily given her positive response to oral medications only.  -she is encouraged to continue monitoring blood glucose twice daily, before breakfast and before bed, and to call the clinic if she has readings less than 70 or greater than 200 for 3 day out of the week.  - Specific targets for  A1c;  LDL, HDL,  and Triglycerides were discussed with the patient.  2) Blood Pressure /Hypertension:  her blood pressure is controlled to target for her age.   she is advised to continue her current medications including Metoprolol 12.5 mg po twice daily.  3) Lipids/Hyperlipidemia:    Review of her recent lipid panel from 05/18/20 showed controlled  LDL at 73 and elevated triglycerides of 286 .  she  is advised to continue Lipitor 40 mg daily at bedtime and Fish Oil supplement.  Side effects and precautions discussed with her.  Will recheck lipid panel prior to next visit.  4)  Weight/Diet:  her Body mass index is 32.6 kg/m.  -  clearly complicating her diabetes care.   she is a candidate for weight loss. I discussed with her the fact that loss of 5 - 10% of her  current body weight will have the most impact on her diabetes management.   Exercise, and detailed carbohydrates information provided  -  detailed on discharge instructions.  5) Chronic Care/Health Maintenance: -she is on Statin medications and is encouraged to initiate and continue to follow up with Ophthalmology, Dentist, Podiatrist at least yearly or according to recommendations, and advised to stay away from smoking. I have recommended yearly flu vaccine and pneumonia vaccine at least every 5 years; moderate intensity exercise for up to 150 minutes weekly; and sleep for at least 7 hours a day.  - she is advised to maintain close follow up with Perlie Mayo, NP for primary care needs, as well as her other providers for optimal and coordinated care.  -Regarding her abnormal ABI results: she will be referred to Vascular specialist in [place] for further evaluation and treatment.   - Time spent on this patient care encounter:  40 min, of which > 50% was spent in  counseling and the rest reviewing her blood glucose logs , discussing her hypoglycemia and hyperglycemia episodes, reviewing her current and  previous labs / studies  ( including abstraction from other facilities) and medications  doses and developing a  long term treatment plan and documenting her care.   Please refer to  Patient Instructions for Blood Glucose Monitoring and Insulin/Medications Dosing Guide"  in media tab for additional information. Please  also refer to " Patient Self Inventory" in the Media  tab for reviewed elements of pertinent patient history.  [name] [name] participated in the discussions, expressed understanding, and voiced agreement with the above plans.  All questions were answered to her satisfaction. she is encouraged to contact clinic should she have any questions or concerns prior to her return visit.   Follow up plan: - Return in about 4 months (around 06/29/2021) for Diabetes follow up with A1c in office, Previsit labs, Bring glucometer and logs.  Rayetta Pigg,  [facility] [address] [place], [place] [address] Phone: [phone] Fax: [phone]  02/27/2021, 10:13 AM

## 2021-02-27 NOTE — Patient Instructions (Signed)
We will check labs in 3 months. Please have fasting labs drawn 2-3 days prior to this appointment.

## 2021-02-27 NOTE — Assessment & Plan Note (Signed)
-  followed by endocrinology; had appt this AM -we discussed that endocrinology will be taking over all aspects of DM mgmt -it is appropriate for her to take BS logs and glucometer to her endocrinologist, and that adding separate appointments to see primary care for this would be redundant, and PCP will not change DM meds once endocrinology takes over DM mgmt

## 2021-02-27 NOTE — Progress Notes (Signed)
 Acute Office Visit  Subjective:    Patient ID: [name] [name], female    DOB: 01/15/1941, 79 y.o.   MRN: [id number]  Chief Complaint  Patient presents with  . Diabetes    Follow up; check A1c    HPI Patient is in today for A1c check. It looks like this was drawn at 0917 today and is 7.8. She is followed by Dr. Nida. She saw Dr. Nida's NP, Whitney Reardon, 2 weeks ago and was told to bring blood sugar logs, and glucometer to her appointment in 2 weeks.  She is taking metformin 500 mg BID, glipizide XL 5 mg daily, and Tradjenta 5 mg daily.  She had a televisit about a month ago and was referred to endocrinology with no f/u scheduled with us d/t referral placed for DM.  Past Medical History:  Diagnosis Date  . Anxiety   . CAD (coronary artery disease)    Multivessel status post CABG June 2021 - LIMA to LAD, SVG to distal RCA, SVG to OM1  . Chronic obstructive pulmonary disease, unspecified (HCC)   . Depression   . Essential hypertension   . GERD   . History of pneumonia   . Incarcerated ventral hernia   . Osteoarthritis   . Type 2 diabetes mellitus (HCC)     Past Surgical History:  Procedure Laterality Date  . CORONARY ARTERY BYPASS GRAFT N/A 05/22/2020   Procedure: CORONARY ARTERY BYPASS GRAFTING (CABG) x3 LIMA TO LAD, SVG TO OM1, SVG TO DISTAL RIGHT;  Surgeon: Lightfoot, Harrell O, MD;  Location: [facility] OR;  Service: Open Heart Surgery;  Laterality: N/A;  . LEFT HEART CATH AND CORONARY ANGIOGRAPHY N/A 05/21/2020   Procedure: LEFT HEART CATH AND CORONARY ANGIOGRAPHY;  Surgeon: Jordan, Peter M, MD;  Location: [facility] INVASIVE [facility];  Service: Cardiovascular;  Laterality: N/A;  . NEPHRECTOMY    . TEE WITHOUT CARDIOVERSION N/A 05/22/2020   Procedure: TRANSESOPHAGEAL ECHOCARDIOGRAM (TEE);  Surgeon: Lightfoot, Harrell O, MD;  Location: [facility] OR;  Service: Open Heart Surgery;  Laterality: N/A;  . VEIN HARVEST Right 05/22/2020   Procedure: Open Vein Harvest;  Surgeon: Lightfoot, Harrell O, MD;   Location: [facility] OR;  Service: Open Heart Surgery;  Laterality: Right;  . VENTRAL HERNIA REPAIR  09/01/2017   mistaken entry    Family History  Problem Relation Age of Onset  . CAD Father   . Heart failure Sister     Social History   Socioeconomic History  . Marital status: Widowed    Spouse name: Not on file  . Number of children: 1  . Years of education: 12  . Highest education level: 12th grade  Occupational History  . Occupation: Retired  Tobacco Use  . Smoking status: Never Smoker  . Smokeless tobacco: Never Used  Vaping Use  . Vaping Use: Never used  Substance and Sexual Activity  . Alcohol use: No  . Drug use: No  . Sexual activity: Never  Other Topics Concern  . Not on file  Social History Narrative   Lives alone.  Has 1 daughter      Enjoys reading and cooking      Eats all food groups   Caffeine, some coffee   Water reports drinking pretty well throughout the day      Social Determinants of Health   Financial Resource Strain: Low Risk   . Difficulty of Paying Living Expenses: Not hard at all  Food Insecurity: No Food Insecurity  . Worried About   Running Out of Food in the Last Year: Never true  . Ran Out of Food in the Last Year: Never true  Transportation Needs: No Transportation Needs  . Lack of Transportation (Medical): No  . Lack of Transportation (Non-Medical): No  Physical Activity: Inactive  . Days of Exercise per Week: 0 days  . Minutes of Exercise per Session: 0 min  Stress: No Stress Concern Present  . Feeling of Stress : Only a little  Social Connections: Socially Isolated  . Frequency of Communication with Friends and Family: Twice a week  . Frequency of Social Gatherings with Friends and Family: Once a week  . Attends Religious Services: Never  . Active Member of Clubs or Organizations: No  . Attends Archivist Meetings: Never  . Marital Status: Widowed  Intimate Partner Violence: Not At Risk  . Fear of Current or  Ex-Partner: No  . Emotionally Abused: No  . Physically Abused: No  . Sexually Abused: No    Outpatient Medications Prior to Visit  Medication Sig Dispense Refill  . Accu-Chek Softclix Lancets lancets Use as instructed 100 each 12  . aspirin EC 81 MG tablet Take 1 tablet (81 mg total) by mouth daily. Swallow whole. 90 tablet 3  . atorvastatin (LIPITOR) 40 MG tablet TAKE 1 TABLET BY MOUTH AT BEDTIME. 90 tablet 1  . Biotin w/ Vitamins C & E (HAIR SKIN & NAILS GUMMIES PO) Take 2 each by mouth every morning.    . blood glucose meter kit and supplies Dispense based on patient and insurance preference. Use up to four times daily as directed. (FOR ICD-10 E10.9, E11.9). 1 each 0  . Cetirizine HCl 10 MG CAPS Take by mouth.    . fish oil-omega-3 fatty acids 1000 MG capsule Take 1 g by mouth 2 (two) times daily.    Marland Kitchen glipiZIDE (GLUCOTROL XL) 5 MG 24 hr tablet Take 1 tablet (5 mg total) by mouth daily with breakfast. 30 tablet 1  . glucose blood (ACCU-CHEK GUIDE) test strip Use as instructed to monitor glucose twice daily 100 each 12  . linagliptin (TRADJENTA) 5 MG TABS tablet Take 1 tablet (5 mg total) by mouth daily. 30 tablet 3  . metFORMIN (GLUCOPHAGE) 500 MG tablet Take 1 tablet (500 mg total) by mouth 2 (two) times daily with a meal. 180 tablet 3  . metoprolol tartrate (LOPRESSOR) 25 MG tablet TAKE (1/2) TABLET BY MOUTH 2 TIMES A DAY. (Patient taking differently: Take 12.5 mg by mouth 2 (two) times daily.) 90 tablet 1  . Multiple Vitamin (MULITIVITAMIN WITH MINERALS) TABS Take 1 tablet by mouth daily.    . Multiple Vitamins-Minerals (AIRBORNE GUMMIES PO) Take 1 each by mouth every morning.    . polyethylene glycol (MIRALAX / GLYCOLAX) 17 g packet Take 17 g by mouth daily as needed.    . nitroGLYCERIN (NITROSTAT) 0.4 MG SL tablet Place 1 tablet (0.4 mg total) under the tongue every 5 (five) minutes x 3 doses as needed for chest pain (if no relief after 3rd dose, proceed to the ED for an evaluation or  call 911). 25 tablet 3   No facility-administered medications prior to visit.    Allergies  Allergen Reactions  . Contrast Media [Iodinated Diagnostic Agents]     Unknown reaction. Due to kidney issues. Do not use  . Codeine Rash  . Morphine And Related   . Other     Pt has multiple allergies per pt but unsure of all.   Marland Kitchen  Penicillins     Unknown reaction    Review of Systems  Constitutional: Negative.   Respiratory: Negative.   Cardiovascular: Negative.   Endocrine: Negative for polydipsia, polyphagia and polyuria.  Psychiatric/Behavioral: Negative.        Objective:    Physical Exam Constitutional:      Appearance: Normal appearance.  Cardiovascular:     Rate and Rhythm: Normal rate and regular rhythm.     Pulses: Normal pulses.     Heart sounds: Normal heart sounds.  Pulmonary:     Effort: Pulmonary effort is normal.     Breath sounds: Normal breath sounds.  Neurological:     Mental Status: She is alert.  Psychiatric:        Mood and Affect: Mood normal.        Behavior: Behavior normal.        Thought Content: Thought content normal.        Judgment: Judgment normal.     There were no vitals taken for this visit. Wt Readings from Last 3 Encounters:  02/27/21 161 lb 6.4 oz (73.2 kg)  02/12/21 161 lb (73 kg)  02/03/21 159 lb (72.1 kg)    Health Maintenance Due  Topic Date Due  . Hepatitis C Screening  Never done  . COVID-19 Vaccine (1) Never done  . OPHTHALMOLOGY EXAM  Never done  . TETANUS/TDAP  Never done  . DEXA SCAN  Never done    There are no preventive care reminders to display for this patient.   Lab Results  Component Value Date   TSH 2.02 11/17/2019   Lab Results  Component Value Date   WBC 8.1 02/03/2021   HGB 13.1 02/03/2021   HCT 39.5 02/03/2021   MCV 94.7 02/03/2021   PLT 274 02/03/2021   Lab Results  Component Value Date   NA 136 02/03/2021   K 4.7 02/03/2021   CO2 26 02/03/2021   GLUCOSE 321 (H) 02/03/2021   BUN 28  (H) 02/03/2021   CREATININE 1.16 (H) 02/03/2021   BILITOT 0.3 02/03/2021   ALKPHOS 54 02/03/2021   AST 20 02/03/2021   ALT 31 02/03/2021   PROT 7.1 02/03/2021   ALBUMIN 3.7 02/03/2021   CALCIUM 9.1 02/03/2021   ANIONGAP 7 02/03/2021   Lab Results  Component Value Date   CHOL 159 05/18/2020   Lab Results  Component Value Date   HDL 29 (L) 05/18/2020   Lab Results  Component Value Date   LDLCALC 73 05/18/2020   Lab Results  Component Value Date   TRIG 286 (H) 05/18/2020   Lab Results  Component Value Date   CHOLHDL 5.5 05/18/2020   Lab Results  Component Value Date   HGBA1C 7.8 (A) 02/27/2021       Assessment & Plan:   Problem List Items Addressed This Visit      Endocrine   Type 2 diabetes mellitus with diabetic nephropathy (HCC)    -followed by endocrinology; had appt this AM -we discussed that endocrinology will be taking over all aspects of DM mgmt -it is appropriate for her to take BS logs and glucometer to her endocrinologist, and that adding separate appointments to see primary care for this would be redundant, and PCP will not change DM meds once endocrinology takes over DM mgmt      Relevant Orders   CBC with Differential/Platelet   CMP14+EGFR   Lipid Panel With LDL/HDL Ratio       No orders of the   defined types were placed in this encounter.    JOSEPH M GRAY, NP 

## 2021-02-27 NOTE — Patient Instructions (Signed)

## 2021-03-12 ENCOUNTER — Ambulatory Visit: Payer: Medicare Other | Admitting: Nutrition

## 2021-03-19 ENCOUNTER — Ambulatory Visit: Payer: Medicare Other | Admitting: *Deleted

## 2021-03-28 ENCOUNTER — Encounter: Payer: Medicare Other | Attending: Family Medicine | Admitting: Nutrition

## 2021-03-28 ENCOUNTER — Other Ambulatory Visit: Payer: Self-pay

## 2021-03-28 VITALS — Ht 59.0 in | Wt 158.0 lb

## 2021-03-28 DIAGNOSIS — E1121 Type 2 diabetes mellitus with diabetic nephropathy: Secondary | ICD-10-CM | POA: Diagnosis not present

## 2021-03-28 DIAGNOSIS — N1832 Chronic kidney disease, stage 3b: Secondary | ICD-10-CM | POA: Insufficient documentation

## 2021-03-28 DIAGNOSIS — I119 Hypertensive heart disease without heart failure: Secondary | ICD-10-CM | POA: Insufficient documentation

## 2021-03-28 DIAGNOSIS — E782 Mixed hyperlipidemia: Secondary | ICD-10-CM | POA: Insufficient documentation

## 2021-03-28 NOTE — Progress Notes (Signed)
Medical Nutrition Therapy  Appointment Start time:  1430  Appointment End time:  1530  Primary concerns today: DM Type 2  Referral diagnosis: E11.8 Preferred learning  change in progress  NUTRITION ASSESSMENT  She sees Ronny Bacon FNP at [facility]  Anthropometrics  Wt Readings from Last 3 Encounters:  02/27/21 160 lb (72.6 kg)  02/27/21 161 lb 6.4 oz (73.2 kg)  02/12/21 161 lb (73 kg)   Ht Readings from Last 3 Encounters:  02/27/21 4\' 11"  (1.499 m)  02/27/21 4\' 11"  (1.499 m)  02/12/21 4\' 11"  (1.499 m)   There is no height or weight on file to calculate BMI. @BMIFA @ Facility age limit for growth percentiles is 20 years. Facility age limit for growth percentiles is 20 years.  Lab Results  Component Value Date   HGBA1C 7.8 (A) 02/27/2021   CMP Latest Ref Rng & Units 02/03/2021 12/28/2020 12/25/2020  Glucose 70 - 99 mg/dL 03/01/2021) 02/05/2021) 12/30/2020)  BUN 8 - 23 mg/dL 02/22/2021) 19 21  Creatinine 0.44 - 1.00 mg/dL 001(V) 494(W) 967(R)  Sodium 135 - 145 mmol/L 136 138 141  Potassium 3.5 - 5.1 mmol/L 4.7 4.9 5.4(H)  Chloride 98 - 111 mmol/L 103 99 101  CO2 22 - 32 mmol/L 26 22 27   Calcium 8.9 - 10.3 mg/dL 9.1 9.5 9.5  Total Protein 6.5 - 8.1 g/dL 7.1 7.1 6.9  Total Bilirubin 0.3 - 1.2 mg/dL 0.3 0.4 0.3  Alkaline Phos 38 - 126 U/L 54 76 75  AST 15 - 41 U/L 20 34 34  ALT 0 - 44 U/L 31 58(H) 56(H)   Lipid Panel     Component Value Date/Time   CHOL 159 05/18/2020 0341   TRIG 286 (H) 05/18/2020 0341   HDL 29 (L) 05/18/2020 0341   CHOLHDL 5.5 05/18/2020 0341   VLDL 57 (H) 05/18/2020 0341   LDLCALC 73 05/18/2020 0341   LDLCALC 109 (H) 03/07/2020 1137     Clinical Medical Hx: Open heart surgery June 2021 Medications: 07/18/2020 Metformin , Trajenta,  Notable Signs/Symptoms:   Lifestyle & Dietary Hx Doesn't cook a lot.   Estimated daily fluid intake: 64oz Supplements: MVI, Fish Oil,  Sleep: 8 hrs Stress / self-care:  Current average weekly physical activity:  ADL  24-Hr Dietary Recall First Meal: Egg, bacon, toast, coffee or liver pudding Snack:  Second Meal: Chicken salad sandwich, water  Snack:  Third Meal: meat and vegetables, water Snack:  Beverages: water Estimated Energy Needs Calories: 1200  Carbohydrate: 135g Protein: 90g Fat: 33g   NUTRITION DIAGNOSIS  NB-1.1 Food and nutrition-related knowledge deficit As related to Diabetes Type 2.  As evidenced by A1C 7.8%   NUTRITION INTERVENTION  Nutrition education (E-1) on the following topics:  . Nutrition and Diabetes education provided on My Plate, CHO counting, meal planning, portion sizes, timing of meals, avoiding snacks between meals unless having a low blood sugar, target ranges for A1C and blood sugars, signs/symptoms and treatment of hyper/hypoglycemia, monitoring blood sugars, taking medications as prescribed, benefits of exercising 30 minutes per day and prevention of complications of DM. 07/18/2020   Handouts Provided Include   The Plate Method  Meal Plan Card  Diabetes Instructions   Learning Style & Readiness for Change Teaching method utilized: Visual & Auditory  Demonstrated degree of understanding via: Teach Back  Barriers to learning/adherence to lifestyle change: none  Goals Established by Pt . Follow MY Plate . Eat 30-45 g CHO at meals . Eat meals on time .  Increase low carb vegetables. . Don't skip meals . Cut out sweets, juices, sodas and sugary foods . Drink 4-5 bottles of water per day   MONITORING & EVALUATION Dietary intake, weekly physical activity, and blood sugars in 1 month.  Next Steps  Patient is to work on eating 3 balanced meals.Marland Kitchen

## 2021-04-04 ENCOUNTER — Other Ambulatory Visit: Payer: Self-pay

## 2021-04-04 DIAGNOSIS — E1121 Type 2 diabetes mellitus with diabetic nephropathy: Secondary | ICD-10-CM

## 2021-04-04 MED ORDER — GLIPIZIDE ER 5 MG PO TB24: 5.0000 mg | ORAL_TABLET | Freq: Every day | ORAL | 1 refills | Status: DC

## 2021-04-16 ENCOUNTER — Encounter: Payer: Self-pay | Admitting: Nutrition

## 2021-04-16 DIAGNOSIS — E1140 Type 2 diabetes mellitus with diabetic neuropathy, unspecified: Secondary | ICD-10-CM | POA: Diagnosis not present

## 2021-04-16 DIAGNOSIS — E1151 Type 2 diabetes mellitus with diabetic peripheral angiopathy without gangrene: Secondary | ICD-10-CM | POA: Diagnosis not present

## 2021-04-16 NOTE — Patient Instructions (Signed)
Goals Established by Pt . Follow MY Plate . Eat 30-45 g CHO at meals . Eat meals on time . Increase low carb vegetables. . Don't skip meals . Cut out sweets, juices, sodas and sugary foods . Drink 4-5 bottles of water per day

## 2021-04-19 ENCOUNTER — Other Ambulatory Visit: Payer: Self-pay | Admitting: *Deleted

## 2021-04-19 DIAGNOSIS — M79606 Pain in leg, unspecified: Secondary | ICD-10-CM

## 2021-04-22 ENCOUNTER — Other Ambulatory Visit: Payer: Self-pay

## 2021-04-22 ENCOUNTER — Ambulatory Visit (INDEPENDENT_AMBULATORY_CARE_PROVIDER_SITE_OTHER): Payer: Medicare Other

## 2021-04-22 ENCOUNTER — Ambulatory Visit (INDEPENDENT_AMBULATORY_CARE_PROVIDER_SITE_OTHER): Payer: Medicare Other | Admitting: Vascular Surgery

## 2021-04-22 ENCOUNTER — Encounter: Payer: Self-pay | Admitting: Vascular Surgery

## 2021-04-22 VITALS — BP 99/65 | HR 71 | Temp 98.2°F | Resp 14 | Ht 59.0 in | Wt 157.0 lb

## 2021-04-22 DIAGNOSIS — I25119 Atherosclerotic heart disease of native coronary artery with unspecified angina pectoris: Secondary | ICD-10-CM

## 2021-04-22 DIAGNOSIS — M79606 Pain in leg, unspecified: Secondary | ICD-10-CM

## 2021-04-22 DIAGNOSIS — I739 Peripheral vascular disease, unspecified: Secondary | ICD-10-CM | POA: Diagnosis not present

## 2021-04-22 NOTE — Progress Notes (Signed)
[facility] [place]  Patient name: [name] [name] MRN: [id number] DOB: September 06, 1941 Sex: female  REASON FOR CONSULT: Discuss abnormal ankle arm indices  HPI: [name] [name] is a 80 y.o. female, who is here today for evaluation of lower extremity arterial insufficiency.  She is here today with her daughter.  She has a very long complex past medical history.  She has very poor understanding of her medical health issues.  She underwent recent noninvasive screening study showing some lower extremity arterial insufficiency and she is here today for formal studies and discussion.  She denies any claudication type symptoms.  She denies any lower extremity tissue loss.  She is status post coronary artery bypass grafting within the last 2 years and did well with this.  She had vein harvest from her right leg.  She does have chronic left leg swelling.  She has history of what appears to be radical nephrectomy in the 1970s.  She has a huge left flank hernia with loss of domain of her intra-abdominal contents.  It is difficult to redirect her from discussing this.  Past Medical History:  Diagnosis Date  . Anxiety   . CAD (coronary artery disease)    Multivessel status post CABG June 2021 - LIMA to LAD, SVG to distal RCA, SVG to OM1  . Chronic obstructive pulmonary disease, unspecified ([place])   . Depression   . Essential hypertension   . GERD   . History of pneumonia   . Incarcerated ventral hernia   . Osteoarthritis   . Type 2 diabetes mellitus (HCC)     Family History  Problem Relation Age of Onset  . CAD Father   . Heart failure Sister     SOCIAL HISTORY: Social History   Socioeconomic History  . Marital status: Widowed    Spouse name: Not on file  . Number of children: 1  . Years of education: 65  . Highest education level: 12th grade  Occupational History  . Occupation: Retired  Tobacco Use  . Smoking status: Never Smoker  .  Smokeless tobacco: Never Used  Vaping Use  . Vaping Use: Never used  Substance and Sexual Activity  . Alcohol use: No  . Drug use: No  . Sexual activity: Never  Other Topics Concern  . Not on file  Social History Narrative   Lives alone.  Has 1 daughter      Enjoys reading and cooking      Eats all food groups   Caffeine, some coffee   Water reports drinking pretty well throughout the day      Social Determinants of Health   Financial Resource Strain: Low Risk   . Difficulty of Paying Living Expenses: Not hard at all  Food Insecurity: No Food Insecurity  . Worried About Charity fundraiser in the Last Year: Never true  . Ran Out of Food in the Last Year: Never true  Transportation Needs: No Transportation Needs  . Lack of Transportation (Medical): No  . Lack of Transportation (Non-Medical): No  Physical Activity: Inactive  . Days of Exercise per Week: 0 days  . Minutes of Exercise per Session: 0 min  Stress: No Stress Concern Present  . Feeling of Stress : Only a little  Social Connections: Socially Isolated  . Frequency of Communication with Friends and Family: Twice a week  . Frequency of Social Gatherings with Friends and Family: Once a week  . Attends Religious Services: Never  .  Active Member of Clubs or Organizations: No  . Attends Archivist Meetings: Never  . Marital Status: Widowed  Intimate Partner Violence: Not At Risk  . Fear of Current or Ex-Partner: No  . Emotionally Abused: No  . Physically Abused: No  . Sexually Abused: No    Allergies  Allergen Reactions  . Contrast Media [Iodinated Diagnostic Agents]     Unknown reaction. Due to kidney issues. Do not use  . Codeine Rash  . Morphine And Related   . Other     Pt has multiple allergies per pt but unsure of all.   . Penicillins     Unknown reaction    Current Outpatient Medications  Medication Sig Dispense Refill  . Accu-Chek Softclix Lancets lancets Use as instructed 100 each 12   . aspirin EC 81 MG tablet Take 1 tablet (81 mg total) by mouth daily. Swallow whole. 90 tablet 3  . atorvastatin (LIPITOR) 40 MG tablet TAKE 1 TABLET BY MOUTH AT BEDTIME. 90 tablet 1  . Biotin w/ Vitamins C & E (HAIR SKIN & NAILS GUMMIES PO) Take 2 each by mouth every morning.    . blood glucose meter kit and supplies Dispense based on patient and insurance preference. Use up to four times daily as directed. (FOR ICD-10 E10.9, E11.9). 1 each 0  . Cetirizine HCl 10 MG CAPS Take by mouth.    . fish oil-omega-3 fatty acids 1000 MG capsule Take 1 g by mouth 2 (two) times daily.    Marland Kitchen glipiZIDE (GLUCOTROL XL) 5 MG 24 hr tablet Take 1 tablet (5 mg total) by mouth daily with breakfast. 30 tablet 1  . glucose blood (ACCU-CHEK GUIDE) test strip Use as instructed to monitor glucose twice daily 100 each 12  . linagliptin (TRADJENTA) 5 MG TABS tablet Take 1 tablet (5 mg total) by mouth daily. 30 tablet 3  . metFORMIN (GLUCOPHAGE) 500 MG tablet Take 1 tablet (500 mg total) by mouth 2 (two) times daily with a meal. 180 tablet 3  . metoprolol tartrate (LOPRESSOR) 25 MG tablet TAKE (1/2) TABLET BY MOUTH 2 TIMES A DAY. (Patient taking differently: Take 12.5 mg by mouth 2 (two) times daily.) 90 tablet 1  . Multiple Vitamin (MULITIVITAMIN WITH MINERALS) TABS Take 1 tablet by mouth daily.    . Multiple Vitamins-Minerals (AIRBORNE GUMMIES PO) Take 1 each by mouth every morning.    . nitroGLYCERIN (NITROSTAT) 0.4 MG SL tablet Place 1 tablet (0.4 mg total) under the tongue every 5 (five) minutes x 3 doses as needed for chest pain (if no relief after 3rd dose, proceed to the ED for an evaluation or call 911). 25 tablet 3  . polyethylene glycol (MIRALAX / GLYCOLAX) 17 g packet Take 17 g by mouth daily as needed. (Patient not taking: No sig reported)     No current facility-administered medications for this visit.    REVIEW OF SYSTEMS:  [X]  denotes positive finding, [ ]  denotes negative finding Cardiac  Comments:   Chest pain or chest pressure:    Shortness of breath upon exertion:    Short of breath when lying flat:    Irregular heart rhythm:        Vascular    Pain in calf, thigh, or hip brought on by ambulation: x   Pain in feet at night that wakes you up from your sleep:  x   Blood clot in your veins:    Leg swelling:  x  Pulmonary    Oxygen at home:    Productive cough:     Wheezing:         Neurologic    Sudden weakness in arms or legs:     Sudden numbness in arms or legs:     Sudden onset of difficulty speaking or slurred speech:    Temporary loss of vision in one eye:     Problems with dizziness:         Gastrointestinal    Blood in stool:     Vomited blood:         Genitourinary    Burning when urinating:     Blood in urine:        Psychiatric    Major depression:         Hematologic    Bleeding problems:    Problems with blood clotting too easily:        Skin    Rashes or ulcers:        Constitutional    Fever or chills:      PHYSICAL EXAM: Vitals:   04/22/21 1042  BP: 99/65  Pulse: 71  Resp: 14  Temp: 98.2 F (36.8 C)  TempSrc: Other (Comment)  SpO2: 97%  Weight: 157 lb (71.2 kg)  Height: 4' 11"  (1.499 m)    GENERAL: The patient is a well-nourished female, in no acute distress. The vital signs are documented above. CARDIOVASCULAR: 2+ radial pulses bilaterally.  I do not palpate pedal pulses bilaterally.  Mild bilateral lower extremity swelling Abdomen: She has a huge left flank and abdominal hernia with major amount of her intra abdominal contents in this hernia. PULMONARY: There is good air exchange  MUSCULOSKELETAL: There are no major deformities or cyanosis. NEUROLOGIC: No focal weakness or paresthesias are detected. SKIN: There are no ulcers or rashes noted. PSYCHIATRIC: The patient has a normal affect.  DATA:  Noninvasive studies today reveal Henkelman index of 0.48 on the right and 0.79 on the left.  MEDICAL ISSUES: Moderate lower  extremity arterial insufficiency on the right and mild on the left.  She is having no symptoms related to this and does not have any history of lower extremity tissue loss.  She does follow-up routinely with podiatry.  I again explained the importance of good foot care and that she should bring to her medical doctor immediately any new tissue loss.  She was reassured with this discussion and will see [place] again on an as-needed basis   Rosetta Posner, MD [facility] [phone] Pager [phone]  Note: Portions of this report may have been transcribed using voice recognition software.  Every effort has been made to ensure accuracy; however, inadvertent computerized transcription errors may still be present.

## 2021-04-26 ENCOUNTER — Other Ambulatory Visit: Payer: Self-pay | Admitting: Family Medicine

## 2021-05-27 ENCOUNTER — Telehealth: Payer: Self-pay

## 2021-05-27 ENCOUNTER — Other Ambulatory Visit: Payer: Self-pay | Admitting: *Deleted

## 2021-05-27 MED ORDER — LINAGLIPTIN 5 MG PO TABS: 5.0000 mg | ORAL_TABLET | Freq: Every day | ORAL | 0 refills | Status: DC

## 2021-05-27 NOTE — Telephone Encounter (Signed)
Please  call in Tradjenta thanks

## 2021-05-27 NOTE — Telephone Encounter (Signed)
Pt medication sent to pharmacy  

## 2021-06-14 ENCOUNTER — Other Ambulatory Visit: Payer: Self-pay | Admitting: Family Medicine

## 2021-06-24 ENCOUNTER — Ambulatory Visit: Payer: Medicare Other | Admitting: Cardiology

## 2021-06-25 ENCOUNTER — Other Ambulatory Visit: Payer: Self-pay

## 2021-06-25 ENCOUNTER — Telehealth: Payer: Self-pay

## 2021-06-25 MED ORDER — LINAGLIPTIN 5 MG PO TABS: 5.0000 mg | ORAL_TABLET | Freq: Every day | ORAL | 0 refills | Status: DC

## 2021-06-25 NOTE — Telephone Encounter (Signed)
Patient needing a refill on Tradjenta 5 mg sent to [facility] pharmacy ph# [phone]

## 2021-06-25 NOTE — Telephone Encounter (Signed)
Rx sent 

## 2021-06-27 ENCOUNTER — Other Ambulatory Visit: Payer: Self-pay | Admitting: Family Medicine

## 2021-06-28 ENCOUNTER — Encounter: Payer: Self-pay | Admitting: Cardiology

## 2021-06-28 ENCOUNTER — Other Ambulatory Visit (HOSPITAL_COMMUNITY)
Admission: RE | Admit: 2021-06-28 | Discharge: 2021-06-28 | Disposition: A | Discharge status: Home / Self Care | Payer: Medicare Other | Source: Ambulatory Visit | Attending: Nurse Practitioner | Admitting: Nurse Practitioner

## 2021-06-28 ENCOUNTER — Other Ambulatory Visit: Payer: Self-pay

## 2021-06-28 ENCOUNTER — Ambulatory Visit (INDEPENDENT_AMBULATORY_CARE_PROVIDER_SITE_OTHER): Payer: Medicare Other | Admitting: Cardiology

## 2021-06-28 VITALS — BP 105/65 | HR 78 | Ht 59.0 in | Wt 155.2 lb

## 2021-06-28 DIAGNOSIS — I25119 Atherosclerotic heart disease of native coronary artery with unspecified angina pectoris: Secondary | ICD-10-CM

## 2021-06-28 DIAGNOSIS — E782 Mixed hyperlipidemia: Secondary | ICD-10-CM

## 2021-06-28 DIAGNOSIS — E1121 Type 2 diabetes mellitus with diabetic nephropathy: Secondary | ICD-10-CM | POA: Insufficient documentation

## 2021-06-28 LAB — COMPREHENSIVE METABOLIC PANEL
ALT: 30 U/L (ref 0–44)
AST: 26 U/L (ref 15–41)
Albumin: 3.9 g/dL (ref 3.5–5.0)
Alkaline Phosphatase: 51 U/L (ref 38–126)
Anion gap: 7 (ref 5–15)
BUN: 24 mg/dL — ABNORMAL HIGH (ref 8–23)
CO2: 25 mmol/L (ref 22–32)
Calcium: 9.3 mg/dL (ref 8.9–10.3)
Chloride: 106 mmol/L (ref 98–111)
Creatinine, Ser: 1.26 mg/dL — ABNORMAL HIGH (ref 0.44–1.00)
GFR, Estimated: 43 mL/min — ABNORMAL LOW (ref >60)
Glucose, Bld: 116 mg/dL — ABNORMAL HIGH (ref 70–99)
Potassium: 4.8 mmol/L (ref 3.5–5.1)
Sodium: 138 mmol/L (ref 135–145)
Total Bilirubin: 0.8 mg/dL (ref 0.3–1.2)
Total Protein: 7.3 g/dL (ref 6.5–8.1)

## 2021-06-28 LAB — CBC WITH DIFFERENTIAL/PLATELET
Abs Immature Granulocytes: 0.01 10*3/uL (ref 0.00–0.07)
Basophils Absolute: 0.1 10*3/uL (ref 0.0–0.1)
Basophils Relative: 1 %
Eosinophils Absolute: 0.2 10*3/uL (ref 0.0–0.5)
Eosinophils Relative: 3 %
HCT: 37.6 % (ref 36.0–46.0)
Hemoglobin: 12.6 g/dL (ref 12.0–15.0)
Immature Granulocytes: 0 %
Lymphocytes Relative: 36 %
Lymphs Abs: 2.7 10*3/uL (ref 0.7–4.0)
MCH: 32 pg (ref 26.0–34.0)
MCHC: 33.5 g/dL (ref 30.0–36.0)
MCV: 95.4 fL (ref 80.0–100.0)
Monocytes Absolute: 0.5 10*3/uL (ref 0.1–1.0)
Monocytes Relative: 6 %
Neutro Abs: 4.2 10*3/uL (ref 1.7–7.7)
Neutrophils Relative %: 54 %
Platelets: 231 10*3/uL (ref 150–400)
RBC: 3.94 MIL/uL (ref 3.87–5.11)
RDW: 12 % (ref 11.5–15.5)
WBC: 7.6 10*3/uL (ref 4.0–10.5)
nRBC: 0 % (ref 0.0–0.2)

## 2021-06-28 LAB — LIPID PANEL
Cholesterol: 89 mg/dL (ref 0–200)
HDL: 31 mg/dL — ABNORMAL LOW (ref >40)
LDL Cholesterol: 24 mg/dL (ref 0–99)
Total CHOL/HDL Ratio: 2.9 RATIO
Triglycerides: 172 mg/dL — ABNORMAL HIGH (ref <150)
VLDL: 34 mg/dL (ref 0–40)

## 2021-06-28 NOTE — Patient Instructions (Addendum)

## 2021-06-28 NOTE — Progress Notes (Signed)
Blood count looks great!

## 2021-06-28 NOTE — Progress Notes (Signed)
Cardiology Office Note  Date: 06/28/2021   ID: [name] [name], DOB [birth year], MRN [id number]  PCP:  [name] Larsson, NP  Cardiologist:  [name] Lesches, MD Electrophysiologist:  None   Chief Complaint  Patient presents with   Cardiac follow-up     History of Present Illness: [name] [name] is a 80 y.o. female last seen in January.  She is here with her daughter for a follow-up visit.  She does not report any active angina or nitroglycerin use at this time.  We went over her medications which are noted below, she reports compliance with therapy and no obvious intolerances with her cardiac regimen.  She had recent lab work showing LDL 24 on Lipitor.  I reviewed her ECG from February as noted below.  Past Medical History:  Diagnosis Date   Anxiety    CAD (coronary artery disease)    Multivessel status post CABG June 2021 - LIMA to LAD, SVG to distal RCA, SVG to OM1   Chronic obstructive pulmonary disease, unspecified ([place])    Depression    Essential hypertension    GERD    History of pneumonia    Incarcerated ventral hernia    Osteoarthritis    Type 2 diabetes mellitus ([place])     Past Surgical History:  Procedure Laterality Date   CORONARY ARTERY BYPASS GRAFT N/A 05/22/2020   Procedure: CORONARY ARTERY BYPASS GRAFTING (CABG) x3 LIMA TO LAD, SVG TO OM1, SVG TO DISTAL RIGHT;  Surgeon: Lajuana Matte, MD;  Location: [facility];  Service: Open Heart Surgery;  Laterality: N/A;   LEFT HEART CATH AND CORONARY ANGIOGRAPHY N/A 05/21/2020   Procedure: LEFT HEART CATH AND CORONARY ANGIOGRAPHY;  Surgeon: Martinique, Peter M, MD;  Location: [facility];  Service: Cardiovascular;  Laterality: N/A;   NEPHRECTOMY     TEE WITHOUT CARDIOVERSION N/A 05/22/2020   Procedure: TRANSESOPHAGEAL ECHOCARDIOGRAM (TEE);  Surgeon: Lajuana Matte, MD;  Location: [facility];  Service: Open Heart Surgery;  Laterality: N/A;   VEIN HARVEST Right 05/22/2020   Procedure: Open Vein Harvest;  Surgeon:  Lajuana Matte, MD;  Location: [facility];  Service: Open Heart Surgery;  Laterality: Right;   VENTRAL HERNIA REPAIR  09/01/2017   mistaken entry    Current Outpatient Medications  Medication Sig Dispense Refill   Accu-Chek Softclix Lancets lancets Use as instructed 100 each 12   aspirin EC 81 MG tablet Take 1 tablet (81 mg total) by mouth daily. Swallow whole. 90 tablet 3   atorvastatin (LIPITOR) 40 MG tablet TAKE 1 TABLET BY MOUTH AT BEDTIME. 90 tablet 3   Biotin w/ Vitamins C & E (HAIR SKIN & NAILS GUMMIES PO) Take 2 each by mouth every morning.     blood glucose meter kit and supplies Dispense based on patient and insurance preference. Use up to four times daily as directed. (FOR ICD-10 E10.9, E11.9). 1 each 0   Cetirizine HCl 10 MG CAPS Take by mouth.     fish oil-omega-3 fatty acids 1000 MG capsule Take 1 g by mouth 2 (two) times daily.     glipiZIDE (GLUCOTROL XL) 5 MG 24 hr tablet Take 1 tablet (5 mg total) by mouth daily with breakfast. 30 tablet 1   glucose blood (ACCU-CHEK GUIDE) test strip Use as instructed to monitor glucose twice daily 100 each 12   linagliptin (TRADJENTA) 5 MG TABS tablet Take 1 tablet (5 mg total) by mouth daily. 30 tablet 0   metFORMIN (  GLUCOPHAGE) 500 MG tablet Take 1 tablet (500 mg total) by mouth 2 (two) times daily with a meal. 180 tablet 3   metoprolol tartrate (LOPRESSOR) 25 MG tablet TAKE (1/2) TABLET BY MOUTH 2 TIMES A DAY. 30 tablet 6   Multiple Vitamin (MULITIVITAMIN WITH MINERALS) TABS Take 1 tablet by mouth daily.     Multiple Vitamins-Minerals (AIRBORNE GUMMIES PO) Take 1 each by mouth every morning.     nitroGLYCERIN (NITROSTAT) 0.4 MG SL tablet Place 1 tablet (0.4 mg total) under the tongue every 5 (five) minutes x 3 doses as needed for chest pain (if no relief after 3rd dose, proceed to the ED for an evaluation or call 911). 25 tablet 3   polyethylene glycol (MIRALAX / GLYCOLAX) 17 g packet Take 17 g by mouth daily as needed for mild  constipation or moderate constipation.     No current facility-administered medications for this visit.   Allergies:  Contrast media [iodinated diagnostic agents], Codeine, Morphine and related, Other, and Penicillins   ROS: No palpitations or syncope.  Physical Exam: VS:  BP 105/65   Pulse 78   Ht 4' 11"  (1.499 m)   Wt 155 lb 3.2 oz (70.4 kg)   SpO2 95%   BMI 31.35 kg/m , BMI Body mass index is 31.35 kg/m.  Wt Readings from Last 3 Encounters:  06/28/21 155 lb 3.2 oz (70.4 kg)  04/22/21 157 lb (71.2 kg)  03/28/21 158 lb (71.7 kg)    General: Patient appears comfortable at rest. HEENT: Conjunctiva and lids normal, wearing a mask. Neck: Supple, no elevated JVP or carotid bruits, no thyromegaly. Lungs: Clear to auscultation, nonlabored breathing at rest. Cardiac: Regular rate and rhythm, no S3 or significant systolic murmur. Extremities: No pitting edema.  ECG:  An ECG dated 02/03/2021 was personally reviewed today and demonstrated:  Sinus rhythm with prolonged PR interval, low voltage, nonspecific ST-T changes.  Recent Labwork: 06/28/2021: ALT 30; AST 26; BUN 24; Creatinine, Ser 1.26; Hemoglobin 12.6; Platelets 231; Potassium 4.8; Sodium 138     Component Value Date/Time   CHOL 89 06/28/2021 1041   TRIG 172 (H) 06/28/2021 1041   HDL 31 (L) 06/28/2021 1041   CHOLHDL 2.9 06/28/2021 1041   VLDL 34 06/28/2021 1041   LDLCALC 24 06/28/2021 1041   LDLCALC 109 (H) 03/07/2020 1137    Other Studies Reviewed Today:  Cardiac catheterization 05/21/2020: Prox LAD to Mid LAD lesion is 95% stenosed. Ost Cx to Prox Cx lesion is 100% stenosed. Prox RCA lesion is 90% stenosed. Mid RCA lesion is 100% stenosed. LV end diastolic pressure is normal.   1. Critical 3 vessel occlusive CAD.    - 95% proximal to mid LAD    -100% proximal LCx. OM1 and OM2 fill by left to left collaterals    - 100% mid RCA. Distal vessel fills by right to right and left to right collaterals 2. Normal LVEDP    Echocardiogram 05/18/2020:  1. Left ventricular ejection fraction, by estimation, is 55 to 60%. The  left ventricle has normal function. The left ventricle has no regional  wall motion abnormalities. There is mild left ventricular hypertrophy.  Left ventricular diastolic parameters  are indeterminate.   2. Right ventricular systolic function is normal. The right ventricular  size is normal.   3. Left atrial size was mildly dilated.   4. The mitral valve is normal in structure. Trivial mitral valve  regurgitation. No evidence of mitral stenosis.   5. The aortic valve  has an indeterminant number of cusps. Aortic valve  regurgitation is not visualized. No aortic stenosis is present.   6. The inferior vena cava is normal in size with greater than 50%  respiratory variability, suggesting right atrial pressure of 3 mmHg.  Assessment and Plan:  1.  CAD status post CABG in June 2021.  She reports no active angina or nitroglycerin use.  ECG from February reviewed.  Continue observation on medical therapy including aspirin, Lipitor, Lopressor, and as needed nitroglycerin.  2.  Mixed hyperlipidemia, LDL down to 24 on Lipitor, tolerating well.  No changes were made today.  Medication Adjustments/Labs and Tests Ordered: Current medicines are reviewed at length with the patient today.  Concerns regarding medicines are outlined above.   Tests Ordered: No orders of the defined types were placed in this encounter.   Medication Changes: No orders of the defined types were placed in this encounter.   Disposition:  Follow up  6 months  Signed, Satira Sark, MD, [facility] 06/28/2021 1:53 PM    [name] [name] at [facility], [place], [place] [address] Phone: [phone]; Fax: [phone]

## 2021-06-30 ENCOUNTER — Encounter (HOSPITAL_COMMUNITY): Payer: Self-pay

## 2021-06-30 ENCOUNTER — Other Ambulatory Visit: Payer: Self-pay

## 2021-06-30 ENCOUNTER — Inpatient Hospital Stay (HOSPITAL_COMMUNITY)
Admission: EM | Admit: 2021-06-30 | Discharge: 2021-07-03 | DRG: 303 | Disposition: A | Discharge status: Home / Self Care | Payer: Medicare Other | Attending: Family Medicine | Admitting: Family Medicine

## 2021-06-30 ENCOUNTER — Emergency Department (HOSPITAL_COMMUNITY): Payer: Medicare Other

## 2021-06-30 DIAGNOSIS — R0902 Hypoxemia: Secondary | ICD-10-CM | POA: Diagnosis not present

## 2021-06-30 DIAGNOSIS — E1121 Type 2 diabetes mellitus with diabetic nephropathy: Secondary | ICD-10-CM | POA: Diagnosis present

## 2021-06-30 DIAGNOSIS — E782 Mixed hyperlipidemia: Secondary | ICD-10-CM | POA: Diagnosis present

## 2021-06-30 DIAGNOSIS — R0689 Other abnormalities of breathing: Secondary | ICD-10-CM | POA: Diagnosis not present

## 2021-06-30 DIAGNOSIS — N1832 Chronic kidney disease, stage 3b: Secondary | ICD-10-CM | POA: Diagnosis not present

## 2021-06-30 DIAGNOSIS — R0789 Other chest pain: Secondary | ICD-10-CM | POA: Diagnosis not present

## 2021-06-30 DIAGNOSIS — E1122 Type 2 diabetes mellitus with diabetic chronic kidney disease: Secondary | ICD-10-CM | POA: Diagnosis present

## 2021-06-30 DIAGNOSIS — D72829 Elevated white blood cell count, unspecified: Secondary | ICD-10-CM | POA: Diagnosis present

## 2021-06-30 DIAGNOSIS — F40240 Claustrophobia: Secondary | ICD-10-CM | POA: Diagnosis present

## 2021-06-30 DIAGNOSIS — K2100 Gastro-esophageal reflux disease with esophagitis, without bleeding: Secondary | ICD-10-CM | POA: Diagnosis not present

## 2021-06-30 DIAGNOSIS — Z8249 Family history of ischemic heart disease and other diseases of the circulatory system: Secondary | ICD-10-CM

## 2021-06-30 DIAGNOSIS — G459 Transient cerebral ischemic attack, unspecified: Secondary | ICD-10-CM

## 2021-06-30 DIAGNOSIS — I119 Hypertensive heart disease without heart failure: Secondary | ICD-10-CM | POA: Diagnosis present

## 2021-06-30 DIAGNOSIS — G8929 Other chronic pain: Secondary | ICD-10-CM | POA: Diagnosis present

## 2021-06-30 DIAGNOSIS — I25118 Atherosclerotic heart disease of native coronary artery with other forms of angina pectoris: Principal | ICD-10-CM | POA: Diagnosis present

## 2021-06-30 DIAGNOSIS — G8194 Hemiplegia, unspecified affecting left nondominant side: Secondary | ICD-10-CM | POA: Diagnosis present

## 2021-06-30 DIAGNOSIS — I252 Old myocardial infarction: Secondary | ICD-10-CM

## 2021-06-30 DIAGNOSIS — Z7982 Long term (current) use of aspirin: Secondary | ICD-10-CM

## 2021-06-30 DIAGNOSIS — Z7984 Long term (current) use of oral hypoglycemic drugs: Secondary | ICD-10-CM

## 2021-06-30 DIAGNOSIS — Z951 Presence of aortocoronary bypass graft: Secondary | ICD-10-CM

## 2021-06-30 DIAGNOSIS — Z20822 Contact with and (suspected) exposure to covid-19: Secondary | ICD-10-CM | POA: Diagnosis present

## 2021-06-30 DIAGNOSIS — I959 Hypotension, unspecified: Secondary | ICD-10-CM | POA: Diagnosis not present

## 2021-06-30 DIAGNOSIS — Z79899 Other long term (current) drug therapy: Secondary | ICD-10-CM

## 2021-06-30 DIAGNOSIS — R079 Chest pain, unspecified: Secondary | ICD-10-CM | POA: Diagnosis present

## 2021-06-30 DIAGNOSIS — I6523 Occlusion and stenosis of bilateral carotid arteries: Secondary | ICD-10-CM | POA: Diagnosis present

## 2021-06-30 DIAGNOSIS — Z8673 Personal history of transient ischemic attack (TIA), and cerebral infarction without residual deficits: Secondary | ICD-10-CM

## 2021-06-30 DIAGNOSIS — Z91041 Radiographic dye allergy status: Secondary | ICD-10-CM

## 2021-06-30 DIAGNOSIS — F32A Depression, unspecified: Secondary | ICD-10-CM | POA: Diagnosis present

## 2021-06-30 DIAGNOSIS — F419 Anxiety disorder, unspecified: Secondary | ICD-10-CM | POA: Diagnosis present

## 2021-06-30 DIAGNOSIS — M25512 Pain in left shoulder: Secondary | ICD-10-CM | POA: Diagnosis present

## 2021-06-30 DIAGNOSIS — Z885 Allergy status to narcotic agent status: Secondary | ICD-10-CM

## 2021-06-30 DIAGNOSIS — I2510 Atherosclerotic heart disease of native coronary artery without angina pectoris: Secondary | ICD-10-CM | POA: Diagnosis present

## 2021-06-30 DIAGNOSIS — Z880 Allergy status to penicillin: Secondary | ICD-10-CM

## 2021-06-30 DIAGNOSIS — G9349 Other encephalopathy: Secondary | ICD-10-CM | POA: Diagnosis present

## 2021-06-30 DIAGNOSIS — G40209 Localization-related (focal) (partial) symptomatic epilepsy and epileptic syndromes with complex partial seizures, not intractable, without status epilepticus: Secondary | ICD-10-CM | POA: Diagnosis not present

## 2021-06-30 DIAGNOSIS — M1990 Unspecified osteoarthritis, unspecified site: Secondary | ICD-10-CM | POA: Diagnosis present

## 2021-06-30 DIAGNOSIS — Z888 Allergy status to other drugs, medicaments and biological substances status: Secondary | ICD-10-CM

## 2021-06-30 DIAGNOSIS — Z905 Acquired absence of kidney: Secondary | ICD-10-CM

## 2021-06-30 DIAGNOSIS — J449 Chronic obstructive pulmonary disease, unspecified: Secondary | ICD-10-CM | POA: Diagnosis present

## 2021-06-30 DIAGNOSIS — D631 Anemia in chronic kidney disease: Secondary | ICD-10-CM | POA: Diagnosis present

## 2021-06-30 DIAGNOSIS — I1310 Hypertensive heart and chronic kidney disease without heart failure, with stage 1 through stage 4 chronic kidney disease, or unspecified chronic kidney disease: Secondary | ICD-10-CM | POA: Diagnosis present

## 2021-06-30 LAB — CBC
HCT: 35.5 % — ABNORMAL LOW (ref 36.0–46.0)
Hemoglobin: 11.9 g/dL — ABNORMAL LOW (ref 12.0–15.0)
MCH: 31.9 pg (ref 26.0–34.0)
MCHC: 33.5 g/dL (ref 30.0–36.0)
MCV: 95.2 fL (ref 80.0–100.0)
Platelets: 202 10*3/uL (ref 150–400)
RBC: 3.73 MIL/uL — ABNORMAL LOW (ref 3.87–5.11)
RDW: 12 % (ref 11.5–15.5)
WBC: 11.3 10*3/uL — ABNORMAL HIGH (ref 4.0–10.5)
nRBC: 0 % (ref 0.0–0.2)

## 2021-06-30 LAB — GLUCOSE, CAPILLARY: Glucose-Capillary: 85 mg/dL (ref 70–99)

## 2021-06-30 LAB — BASIC METABOLIC PANEL
Anion gap: 6 (ref 5–15)
BUN: 25 mg/dL — ABNORMAL HIGH (ref 8–23)
CO2: 24 mmol/L (ref 22–32)
Calcium: 8.9 mg/dL (ref 8.9–10.3)
Chloride: 106 mmol/L (ref 98–111)
Creatinine, Ser: 1.47 mg/dL — ABNORMAL HIGH (ref 0.44–1.00)
GFR, Estimated: 36 mL/min — ABNORMAL LOW (ref >60)
Glucose, Bld: 168 mg/dL — ABNORMAL HIGH (ref 70–99)
Potassium: 4.6 mmol/L (ref 3.5–5.1)
Sodium: 136 mmol/L (ref 135–145)

## 2021-06-30 LAB — TROPONIN I (HIGH SENSITIVITY)
Troponin I (High Sensitivity): 7 ng/L (ref <18)
Troponin I (High Sensitivity): 8 ng/L (ref <18)
Troponin I (High Sensitivity): 9 ng/L (ref <18)

## 2021-06-30 MED ORDER — ENOXAPARIN SODIUM 30 MG/0.3ML IJ SOSY
30.0000 mg | PREFILLED_SYRINGE | INTRAMUSCULAR | Status: DC
  Administered 2021-06-30 – 2021-07-02 (×3): 30 mg via SUBCUTANEOUS
  Filled 2021-06-30 (×4): qty 0.3

## 2021-06-30 MED ORDER — LORATADINE 10 MG PO TABS
10.0000 mg | ORAL_TABLET | Freq: Every day | ORAL | Status: DC
  Administered 2021-06-30 – 2021-07-03 (×4): 10 mg via ORAL
  Filled 2021-06-30 (×4): qty 1

## 2021-06-30 MED ORDER — ATORVASTATIN CALCIUM 40 MG PO TABS
40.0000 mg | ORAL_TABLET | Freq: Every day | ORAL | Status: DC
  Administered 2021-06-30 – 2021-07-02 (×3): 40 mg via ORAL
  Filled 2021-06-30 (×3): qty 1

## 2021-06-30 MED ORDER — METOPROLOL TARTRATE 25 MG PO TABS
12.5000 mg | ORAL_TABLET | Freq: Two times a day (BID) | ORAL | Status: DC
  Administered 2021-06-30: 12.5 mg via ORAL
  Filled 2021-06-30 (×2): qty 1

## 2021-06-30 MED ORDER — METFORMIN HCL 500 MG PO TABS: 500.0000 mg | ORAL_TABLET | Freq: Two times a day (BID) | ORAL | Status: DC

## 2021-06-30 MED ORDER — ONDANSETRON HCL 4 MG/2ML IJ SOLN: 4.0000 mg | Freq: Four times a day (QID) | INTRAMUSCULAR | Status: DC | PRN

## 2021-06-30 MED ORDER — GLIPIZIDE ER 5 MG PO TB24: 5.0000 mg | ORAL_TABLET | Freq: Every day | ORAL | Status: DC

## 2021-06-30 MED ORDER — ASPIRIN EC 81 MG PO TBEC
81.0000 mg | DELAYED_RELEASE_TABLET | Freq: Every day | ORAL | Status: DC
  Administered 2021-07-01 – 2021-07-03 (×3): 81 mg via ORAL
  Filled 2021-06-30 (×3): qty 1

## 2021-06-30 MED ORDER — ACETAMINOPHEN 325 MG PO TABS
650.0000 mg | ORAL_TABLET | ORAL | Status: DC | PRN
  Administered 2021-07-01 – 2021-07-02 (×2): 650 mg via ORAL
  Filled 2021-06-30 (×2): qty 2

## 2021-06-30 MED ORDER — ASPIRIN 81 MG PO CHEW: 324.0000 mg | CHEWABLE_TABLET | Freq: Once | ORAL | Status: DC

## 2021-06-30 MED ORDER — INSULIN ASPART 100 UNIT/ML IJ SOLN
0.0000 [IU] | Freq: Three times a day (TID) | INTRAMUSCULAR | Status: DC
  Administered 2021-07-01: 3 [IU] via SUBCUTANEOUS
  Administered 2021-07-01: 2 [IU] via SUBCUTANEOUS
  Administered 2021-07-01 – 2021-07-02 (×2): 3 [IU] via SUBCUTANEOUS
  Administered 2021-07-02: 2 [IU] via SUBCUTANEOUS
  Administered 2021-07-02: 3 [IU] via SUBCUTANEOUS
  Administered 2021-07-03: 2 [IU] via SUBCUTANEOUS
  Administered 2021-07-03: 5 [IU] via SUBCUTANEOUS
  Administered 2021-07-03: 3 [IU] via SUBCUTANEOUS

## 2021-06-30 MED ORDER — LINAGLIPTIN 5 MG PO TABS
5.0000 mg | ORAL_TABLET | Freq: Every day | ORAL | Status: DC
  Administered 2021-06-30 – 2021-07-03 (×4): 5 mg via ORAL
  Filled 2021-06-30 (×4): qty 1

## 2021-06-30 NOTE — Plan of Care (Signed)

## 2021-06-30 NOTE — H&P (Signed)
History and Physical  [name] [name] TGG:[id number] DOB: 1941/02/22 DOA: 06/30/2021  Referring physician: Sherrell Puller, EDP PCP: [name], NP  Outpatient Specialists:  [name]  Patient Coming From: home  Chief Complaint: chest pain  HPI: [name] [name] is a 80 y.o. female with a history of diabetes, hypertension, coronary artery disease status post three-vessel CABG in June 2021, COPD, depression.  Patient woke up with severe chest pain this morning, located in the left chest radiating up into the left shoulder and down arm.  She had a headache and appears somewhat diaphoretic and pale.  She tried to eat some yogurt, but when the pain persisted, she was brought to the hospital for evaluation.  Pain is improved after nitroglycerin and aspirin.  No other palliating or provoking factors.  Emergency Department Course: Troponins negative.  EDP consulted with cardiology, who recommended observation with cardiology to see tomorrow  Review of Systems:  Pt denies any fevers, chills, nausea, vomiting, diarrhea, constipation, abdominal pain, shortness of breath, dyspnea on exertion, orthopnea, cough, wheezing, palpitations, headache, vision changes, lightheadedness, dizziness, melena, rectal bleeding.  Review of systems are otherwise negative  Past Medical History:  Diagnosis Date   Anxiety    CAD (coronary artery disease)    Multivessel status post CABG June 2021 - LIMA to LAD, SVG to distal RCA, SVG to OM1   Chronic obstructive pulmonary disease, unspecified ([place])    Depression    Essential hypertension    GERD    History of pneumonia    Incarcerated ventral hernia    Osteoarthritis    Type 2 diabetes mellitus ([place])    Past Surgical History:  Procedure Laterality Date   CORONARY ARTERY BYPASS GRAFT N/A 05/22/2020   Procedure: CORONARY ARTERY BYPASS GRAFTING (CABG) x3 LIMA TO LAD, SVG TO OM1, SVG TO DISTAL RIGHT;  Surgeon: Lajuana Matte, MD;  Location: [facility];   Service: Open Heart Surgery;  Laterality: N/A;   LEFT HEART CATH AND CORONARY ANGIOGRAPHY N/A 05/21/2020   Procedure: LEFT HEART CATH AND CORONARY ANGIOGRAPHY;  Surgeon: Martinique, Peter M, MD;  Location: [facility];  Service: Cardiovascular;  Laterality: N/A;   NEPHRECTOMY     TEE WITHOUT CARDIOVERSION N/A 05/22/2020   Procedure: TRANSESOPHAGEAL ECHOCARDIOGRAM (TEE);  Surgeon: Lajuana Matte, MD;  Location: [facility];  Service: Open Heart Surgery;  Laterality: N/A;   VEIN HARVEST Right 05/22/2020   Procedure: Open Vein Harvest;  Surgeon: Lajuana Matte, MD;  Location: [facility];  Service: Open Heart Surgery;  Laterality: Right;   VENTRAL HERNIA REPAIR  09/01/2017   mistaken entry   Social History:  reports that she has never smoked. She has never used smokeless tobacco. She reports that she does not drink alcohol and does not use drugs. Patient lives at home  Allergies  Allergen Reactions   Contrast Media [Iodinated Diagnostic Agents] Other (See Comments)    Unknown reaction. Due to kidney issues. Do not use   Codeine Rash   Morphine And Related Other (See Comments)    Unknown Reaction   Other     Pt has multiple allergies per pt but unsure of all.    Penicillins Other (See Comments)    Unknown reaction    Family History  Problem Relation Age of Onset   CAD Father    Heart failure Sister       Prior to Admission medications   Medication Sig Start Date End Date Taking? Authorizing Provider  Accu-Chek Softclix Lancets lancets Use as instructed 02/12/21  Yes Brita Romp, NP  aspirin EC 81 MG tablet Take 1 tablet (81 mg total) by mouth daily. Swallow whole. 12/25/20  Yes Satira Sark, MD  atorvastatin (LIPITOR) 40 MG tablet TAKE 1 TABLET BY MOUTH AT BEDTIME. 06/27/21  Yes Verta Ellen., NP  Biotin w/ Vitamins C & E (HAIR SKIN & NAILS GUMMIES PO) Take 2 each by mouth every morning.   Yes [provider]  blood glucose meter kit and supplies Dispense  based on patient and insurance preference. Use up to four times daily as directed. (FOR ICD-10 E10.9, E11.9). 11/28/20  Yes Perlie Mayo, NP  cetirizine (ZYRTEC) 10 MG tablet Take 10 mg by mouth daily as needed (allergies).   Yes [provider]  fish oil-omega-3 fatty acids 1000 MG capsule Take 1 g by mouth 2 (two) times daily.   Yes [provider]  glipiZIDE (GLUCOTROL XL) 5 MG 24 hr tablet Take 1 tablet (5 mg total) by mouth daily with breakfast. 04/04/21  Yes [name], NP  glucose blood (ACCU-CHEK GUIDE) test strip Use as instructed to monitor glucose twice daily 02/12/21  Yes Reardon, Juanetta Beets, NP  linagliptin (TRADJENTA) 5 MG TABS tablet Take 1 tablet (5 mg total) by mouth daily. 06/25/21  Yes [name], NP  metFORMIN (GLUCOPHAGE) 500 MG tablet Take 1 tablet (500 mg total) by mouth 2 (two) times daily with a meal. 11/29/20  Yes Perlie Mayo, NP  metoprolol tartrate (LOPRESSOR) 25 MG tablet TAKE (1/2) TABLET BY MOUTH 2 TIMES A DAY. Patient taking differently: Take 12.5 mg by mouth 2 (two) times daily. 06/14/21  Yes Verta Ellen., NP  Multiple Vitamin (MULITIVITAMIN WITH MINERALS) TABS Take 1 tablet by mouth daily.   Yes [provider]  nitroGLYCERIN (NITROSTAT) 0.4 MG SL tablet Place 1 tablet (0.4 mg total) under the tongue every 5 (five) minutes x 3 doses as needed for chest pain (if no relief after 3rd dose, proceed to the ED for an evaluation or call 911). 08/31/20 08/24/21 Yes Satira Sark, MD  polyethylene glycol (MIRALAX / GLYCOLAX) 17 g packet Take 17 g by mouth daily as needed for mild constipation or moderate constipation.   Yes [provider]    Physical Exam: BP (!) 117/95   Pulse 66   Temp 98.6 F (37 C)   Resp 20   Ht 4' 11"  (1.499 m)   Wt 68 kg   SpO2 95%   BMI 30.30 kg/m   General: Elderly female. Awake and alert and oriented x3. No acute cardiopulmonary distress.  HEENT: Normocephalic atraumatic.  Right and  left ears normal in appearance.  Pupils equal, round, reactive to light. Extraocular muscles are intact. Sclerae anicteric and noninjected.  Moist mucosal membranes. No mucosal lesions.  Neck: Neck supple without lymphadenopathy. No carotid bruits. No masses palpated.  Cardiovascular: Regular rate with normal S1-S2 sounds. No murmurs, rubs, gallops auscultated. No JVD.  Respiratory: Good respiratory effort with no wheezes, rales, rhonchi. Lungs clear to auscultation bilaterally.  No accessory muscle use. Abdomen: Soft, nontender, nondistended. Active bowel sounds. No masses or hepatosplenomegaly  Skin: No rashes, lesions, or ulcerations.  Dry, warm to touch. 2+ dorsalis pedis and radial pulses. Musculoskeletal: No calf or leg pain. All major joints not erythematous nontender.  No upper or lower joint deformation.  Good ROM.  No contractures  Psychiatric: Intact judgment and insight. Pleasant  and cooperative. Neurologic: No focal neurological deficits. Strength is 5/5 and symmetric in upper and lower extremities.  Cranial nerves II through XII are grossly intact.           Labs on Admission: I have personally reviewed following labs and imaging studies  CBC: Recent Labs  Lab 06/28/21 1040 06/30/21 1247  WBC 7.6 11.3*  NEUTROABS 4.2  --   HGB 12.6 11.9*  HCT 37.6 35.5*  MCV 95.4 95.2  PLT 231 956   Basic Metabolic Panel: Recent Labs  Lab 06/28/21 1040 06/30/21 1247  NA 138 136  K 4.8 4.6  CL 106 106  CO2 25 24  GLUCOSE 116* 168*  BUN 24* 25*  CREATININE 1.26* 1.47*  CALCIUM 9.3 8.9   GFR: Estimated Creatinine Clearance: 26 mL/min (A) (by C-G formula based on SCr of 1.47 mg/dL (H)). Liver Function Tests: Recent Labs  Lab 06/28/21 1040  AST 26  ALT 30  ALKPHOS 51  BILITOT 0.8  PROT 7.3  ALBUMIN 3.9   No results for input(s): LIPASE, AMYLASE in the last 168 hours. No results for input(s): AMMONIA in the last 168 hours. Coagulation Profile: No results for input(s):  INR, PROTIME in the last 168 hours. Cardiac Enzymes: No results for input(s): CKTOTAL, CKMB, CKMBINDEX, TROPONINI in the last 168 hours. BNP (last 3 results) No results for input(s): PROBNP in the last 8760 hours. HbA1C: No results for input(s): HGBA1C in the last 72 hours. CBG: No results for input(s): GLUCAP in the last 168 hours. Lipid Profile: Recent Labs    06/28/21 1041  CHOL 89  HDL 31*  LDLCALC 24  TRIG 172*  CHOLHDL 2.9   Thyroid Function Tests: No results for input(s): TSH, T4TOTAL, FREET4, T3FREE, THYROIDAB in the last 72 hours. Anemia Panel: No results for input(s): VITAMINB12, FOLATE, FERRITIN, TIBC, IRON, RETICCTPCT in the last 72 hours. Urine analysis:    Component Value Date/Time   COLORURINE YELLOW 02/03/2021 1848   APPEARANCEUR CLEAR 02/03/2021 1848   LABSPEC 1.012 02/03/2021 1848   PHURINE 5.0 02/03/2021 1848   GLUCOSEU >=500 (A) 02/03/2021 1848   HGBUR NEGATIVE 02/03/2021 1848   BILIRUBINUR NEGATIVE 02/03/2021 1848   KETONESUR NEGATIVE 02/03/2021 1848   PROTEINUR NEGATIVE 02/03/2021 1848   UROBILINOGEN 0.2 05/30/2010 1903   NITRITE NEGATIVE 02/03/2021 1848   LEUKOCYTESUR NEGATIVE 02/03/2021 1848   Sepsis Labs: @LABRCNTIP (procalcitonin:4,lacticidven:4) )No results found for this or any previous visit (from the past 240 hour(s)).   Radiological Exams on Admission: DG Chest Portable 1 View  Result Date: 06/30/2021 CLINICAL DATA:  Left-sided chest pain, onset today. EXAM: PORTABLE CHEST 1 VIEW COMPARISON:  07/13/2020 FINDINGS: Post median sternotomy and CABG. Aortic atherosclerosis. The cardiomediastinal contours are normal. Mild left basilar scarring is stable. Pulmonary vasculature is normal. No consolidation, pleural effusion, or pneumothorax. No acute osseous abnormalities are seen. IMPRESSION: No acute chest findings. Aortic Atherosclerosis (ICD10-I70.0). Electronically Signed   By: Keith Rake M.D.   On: 06/30/2021 13:06    EKG: Independently  reviewed.  Sinus rhythm with right axis deviation.  Low voltage.  Nonspecific repolarization abnormalities.  Unchanged from previous.  No acute ST changes  Assessment/Plan: Active Problems:   Gastro-esophageal reflux disease with esophagitis   Atherosclerotic heart disease of native coronary artery without angina pectoris   Chronic kidney disease, stage 3b (HCC)   Hypertensive heart disease without heart failure   Type 2 diabetes mellitus with diabetic nephropathy (H[facility])   Mixed hyperlipidemia   Chest pain  This patient was discussed with the ED physician, including pertinent vitals, physical exam findings, labs, and imaging.  We also discussed care given by the ED provider.  Chest pain in the setting of coronary artery disease, status post CABG, hypertension Observation on telemetry Repeat troponins Continue aspirin daily Antihypertensives Diabetes Continue home regimen Sliding scale insulin with CBGs AC nightly to cover Chronic kidney disease Stable GERD  DVT prophylaxis: Lovenox Consultants: Cardiology Code Status: Full code Family Communication: Daughter present during interview and exam Disposition Plan: Patient to return home following evaluation   Truett Mainland, DO

## 2021-06-30 NOTE — ED Triage Notes (Signed)
Pt bib ems from [facility] for cp.  L sided cp that radiates down L arm.  Hx of MI.  Vss by ems normal ekg.  1 nitro given by ems.  324 asa given by ems.  20 in LAC by ems. Bgl 208.

## 2021-06-30 NOTE — ED Provider Notes (Signed)
[facility] Provider Note   CSN: [id number] Arrival date & time: 06/30/21  1214     History Chief Complaint  Patient presents with   Chest Pain    [name] [name] is a 80 y.o. female.   Chest Pain  This patient is a very pleasant 80 year old female who presents to the hospital by EMS transport after having chest pain in her left side of her chest going to her shoulder and radiating down her arm, she was diaphoretic and pale according to family members, she states that her symptoms are significantly better at this time, she was given aspirin by EMS as well as a nitroglycerin and feels better after that.  The patient states that she was in her usual state of health last night before she went to bed and woke up this morning at around 9:00 feeling very very bad, her symptoms have almost completely resolved but she still has an occasional chest discomfort.  I have reviewed the medical record and it appears that she had been seen by Dr. Myles Gip office 2 days ago and was having no symptoms at that time.  Past Medical History:  Diagnosis Date   Anxiety    CAD (coronary artery disease)    Multivessel status post CABG June 2021 - LIMA to LAD, SVG to distal RCA, SVG to OM1   Chronic obstructive pulmonary disease, unspecified ([place])    Depression    Essential hypertension    GERD    History of pneumonia    Incarcerated ventral hernia    Osteoarthritis    Type 2 diabetes mellitus ([place])     Patient Active Problem List   Diagnosis Date Noted   Medication management 12/12/2020   Mixed hyperlipidemia 11/27/2020   Left lower quadrant abdominal mass 11/27/2020   Abdominal wall mass of left flank 11/27/2020   Acute pain of left shoulder 10/03/2020   Joint stiffness of hand, left 10/03/2020   Gastro-esophageal reflux disease with esophagitis    Major depressive disorder, single episode, unspecified    Unspecified osteoarthritis, unspecified site    Atherosclerotic  heart disease of native coronary artery without angina pectoris    Chronic kidney disease, stage 3b ([place])    Hypertensive heart disease without heart failure    Type 2 diabetes mellitus with diabetic nephropathy (HCC)    Ventral hernia without obstruction or gangrene 09/01/2017    Past Surgical History:  Procedure Laterality Date   CORONARY ARTERY BYPASS GRAFT N/A 05/22/2020   Procedure: CORONARY ARTERY BYPASS GRAFTING (CABG) x3 LIMA TO LAD, SVG TO OM1, SVG TO DISTAL RIGHT;  Surgeon: Lajuana Matte, MD;  Location: [facility];  Service: Open Heart Surgery;  Laterality: N/A;   LEFT HEART CATH AND CORONARY ANGIOGRAPHY N/A 05/21/2020   Procedure: LEFT HEART CATH AND CORONARY ANGIOGRAPHY;  Surgeon: Martinique, Peter M, MD;  Location: [facility];  Service: Cardiovascular;  Laterality: N/A;   NEPHRECTOMY     TEE WITHOUT CARDIOVERSION N/A 05/22/2020   Procedure: TRANSESOPHAGEAL ECHOCARDIOGRAM (TEE);  Surgeon: Lajuana Matte, MD;  Location: [facility];  Service: Open Heart Surgery;  Laterality: N/A;   VEIN HARVEST Right 05/22/2020   Procedure: Open Vein Harvest;  Surgeon: Lajuana Matte, MD;  Location: [facility];  Service: Open Heart Surgery;  Laterality: Right;   VENTRAL HERNIA REPAIR  09/01/2017   mistaken entry     OB History   No obstetric history on file.     Family History  Problem  Relation Age of Onset   CAD Father    Heart failure Sister     Social History   Tobacco Use   Smoking status: Never   Smokeless tobacco: Never  Vaping Use   Vaping Use: Never used  Substance Use Topics   Alcohol use: No   Drug use: No    Home Medications Prior to Admission medications   Medication Sig Start Date End Date Taking? Authorizing Provider  Accu-Chek Softclix Lancets lancets Use as instructed 02/12/21   Brita Romp, NP  aspirin EC 81 MG tablet Take 1 tablet (81 mg total) by mouth daily. Swallow whole. 12/25/20   Satira Sark, MD  atorvastatin (LIPITOR) 40 MG tablet TAKE 1  TABLET BY MOUTH AT BEDTIME. 06/27/21   Verta Ellen., NP  Biotin w/ Vitamins C & E (HAIR SKIN & NAILS GUMMIES PO) Take 2 each by mouth every morning.    [provider]  blood glucose meter kit and supplies Dispense based on patient and insurance preference. Use up to four times daily as directed. (FOR ICD-10 E10.9, E11.9). 11/28/20   Perlie Mayo, NP  Cetirizine HCl 10 MG CAPS Take by mouth.    [provider]  fish oil-omega-3 fatty acids 1000 MG capsule Take 1 g by mouth 2 (two) times daily.    [provider]  glipiZIDE (GLUCOTROL XL) 5 MG 24 hr tablet Take 1 tablet (5 mg total) by mouth daily with breakfast. 04/04/21   [name], NP  glucose blood (ACCU-CHEK GUIDE) test strip Use as instructed to monitor glucose twice daily 02/12/21   Brita Romp, NP  linagliptin (TRADJENTA) 5 MG TABS tablet Take 1 tablet (5 mg total) by mouth daily. 06/25/21   [name], NP  metFORMIN (GLUCOPHAGE) 500 MG tablet Take 1 tablet (500 mg total) by mouth 2 (two) times daily with a meal. 11/29/20   Perlie Mayo, NP  metoprolol tartrate (LOPRESSOR) 25 MG tablet TAKE (1/2) TABLET BY MOUTH 2 TIMES A DAY. 06/14/21   Verta Ellen., NP  Multiple Vitamin (MULITIVITAMIN WITH MINERALS) TABS Take 1 tablet by mouth daily.    [provider]  Multiple Vitamins-Minerals (AIRBORNE GUMMIES PO) Take 1 each by mouth every morning.    [provider]  nitroGLYCERIN (NITROSTAT) 0.4 MG SL tablet Place 1 tablet (0.4 mg total) under the tongue every 5 (five) minutes x 3 doses as needed for chest pain (if no relief after 3rd dose, proceed to the ED for an evaluation or call 911). 08/31/20 06/28/21  Satira Sark, MD  polyethylene glycol (MIRALAX / GLYCOLAX) 17 g packet Take 17 g by mouth daily as needed for mild constipation or moderate constipation.    [provider]    Allergies    Contrast media [iodinated diagnostic agents], Codeine, Morphine and  related, Other, and Penicillins  Review of Systems   Review of Systems  Cardiovascular:  Positive for chest pain.  All other systems reviewed and are negative.  Physical Exam Updated Vital Signs BP (!) 124/59   Pulse 83   Temp 98.6 F (37 C)   Resp 20   Ht 1.499 m (_0 )   Wt 68 kg   SpO2 95%   BMI 30.30 kg/m   Physical Exam Vitals and nursing note reviewed.  Constitutional:      General: She is not in acute distress.    Appearance: She is well-developed.  HENT:  Head: Normocephalic and atraumatic.     Mouth/Throat:     Pharynx: No oropharyngeal exudate.  Eyes:     General: No scleral icterus.       Right eye: No discharge.        Left eye: No discharge.     Conjunctiva/sclera: Conjunctivae normal.     Pupils: Pupils are equal, round, and reactive to light.  Neck:     Thyroid: No thyromegaly.     Vascular: No JVD.  Cardiovascular:     Rate and Rhythm: Normal rate and regular rhythm.     Heart sounds: Normal heart sounds. No murmur heard.   No friction rub. No gallop.  Pulmonary:     Effort: Pulmonary effort is normal. No respiratory distress.     Breath sounds: Normal breath sounds. No wheezing or rales.     Comments: Well-healed sternotomy scar Chest:     Chest wall: No tenderness.  Abdominal:     General: Bowel sounds are normal. There is no distension.     Palpations: Abdomen is soft. There is no mass.     Tenderness: There is no abdominal tenderness.     Comments: The patient has a moderately tender left side of the abdomen but states this is chronic and not new  Musculoskeletal:        General: No tenderness. Normal range of motion.     Cervical back: Normal range of motion and neck supple.     Right lower leg: No edema.     Left lower leg: No edema.  Lymphadenopathy:     Cervical: No cervical adenopathy.  Skin:    General: Skin is warm and dry.     Findings: No erythema or rash.  Neurological:     Mental Status: She is alert.      Coordination: Coordination normal.  Psychiatric:        Behavior: Behavior normal.    ED Results / Procedures / Treatments   Labs (all labs ordered are listed, but only abnormal results are displayed) Labs Reviewed - No data to display  EKG EKG Interpretation  Date/Time:  Sunday June 30 2021 12:27:01 EDT Ventricular Rate:  86 PR Interval:  177 QRS Duration: 86 QT Interval:  316 QTC Calculation: 378 R Axis:   87 Text Interpretation: Sinus rhythm Borderline right axis deviation Low voltage, precordial leads Nonspecific repol abnormality, lateral leads since last tracing no significant change Confirmed by Noemi Chapel [phone]) on 06/30/2021 12:36:38 PM  Radiology No results found.  Procedures Procedures   Medications Ordered in ED Medications  aspirin chewable tablet 324 mg (has no administration in time range)    ED Course  I have reviewed the triage vital signs and the nursing notes.  Pertinent labs & imaging results that were available during my care of the patient were reviewed by me and considered in my medical decision making (see chart for details).  Clinical Course as of 07/04/21 1526  Sun Jun 30, 2021  1357 Discussed the case with Dr. Marlou Porch who agrees that if this troponin is elevated on the second troponin that she should be transferred down to [facility], if the troponin is not elevated she can stay here and be seen by cardiology in the morning with trending of troponins overnight. [BM]    Clinical Course User Index [BM] Noemi Chapel, MD   MDM Rules/Calculators/A&P  The patient's EKG shows diffuse ST changes which are chronic and seen on multiple prior EKGs.  Her heart rate is around 80, blood pressure is normal, she is afebrile and has normal lung sounds.  She is significantly high risk for recurrent cardiac disease, reviewing home medications it appears that she takes medications including atorvastatin, Tradjenta, Lopressor,  glipizide, baby aspirin, metformin, she is not on any other anticoagulants.  Ultimately this patient had negative troponin, discussed the case with cardiology and then with hospitalist for admission given patient's increased risk of cardiac disease.  There is no focal numbness or weakness on my exam and no signs of facial droop  Final Clinical Impression(s) / ED Diagnoses Final diagnoses:  Chest pain, unspecified type    Rx / DC Orders ED Discharge Orders     None        Noemi Chapel, MD 07/04/21 1527

## 2021-06-30 NOTE — ED Provider Notes (Signed)
Pt's care assumed at 3pm.  Pt's second troponin is pending.  Dr. Anne Fu advised if no troponin elevation admit to [facility] hospitalist  If troponin elevates call him to have pt admitted at [facility].  Dr. Anne Fu advised cardiology can see tomorrow at [facility] if no troponin elevation   Pt's troponin are 8 and repeat 7. Hospitalsit consulted for admission   [name] [name] 06/30/21 1708    Eber Hong, MD 07/04/21 [phone]

## 2021-07-01 ENCOUNTER — Observation Stay (HOSPITAL_COMMUNITY): Payer: Medicare Other

## 2021-07-01 ENCOUNTER — Ambulatory Visit: Payer: Medicare Other | Admitting: Nurse Practitioner

## 2021-07-01 ENCOUNTER — Ambulatory Visit: Payer: Medicare Other | Admitting: Nutrition

## 2021-07-01 DIAGNOSIS — R519 Headache, unspecified: Secondary | ICD-10-CM

## 2021-07-01 DIAGNOSIS — E1121 Type 2 diabetes mellitus with diabetic nephropathy: Secondary | ICD-10-CM | POA: Diagnosis not present

## 2021-07-01 DIAGNOSIS — E119 Type 2 diabetes mellitus without complications: Secondary | ICD-10-CM | POA: Diagnosis not present

## 2021-07-01 DIAGNOSIS — G319 Degenerative disease of nervous system, unspecified: Secondary | ICD-10-CM | POA: Diagnosis not present

## 2021-07-01 DIAGNOSIS — R404 Transient alteration of awareness: Secondary | ICD-10-CM | POA: Diagnosis not present

## 2021-07-01 DIAGNOSIS — M1990 Unspecified osteoarthritis, unspecified site: Secondary | ICD-10-CM | POA: Diagnosis not present

## 2021-07-01 DIAGNOSIS — G9349 Other encephalopathy: Secondary | ICD-10-CM | POA: Diagnosis not present

## 2021-07-01 DIAGNOSIS — I25118 Atherosclerotic heart disease of native coronary artery with other forms of angina pectoris: Secondary | ICD-10-CM | POA: Diagnosis not present

## 2021-07-01 DIAGNOSIS — Z8673 Personal history of transient ischemic attack (TIA), and cerebral infarction without residual deficits: Secondary | ICD-10-CM | POA: Diagnosis not present

## 2021-07-01 DIAGNOSIS — G459 Transient cerebral ischemic attack, unspecified: Secondary | ICD-10-CM | POA: Diagnosis present

## 2021-07-01 DIAGNOSIS — F32A Depression, unspecified: Secondary | ICD-10-CM | POA: Diagnosis not present

## 2021-07-01 DIAGNOSIS — E1122 Type 2 diabetes mellitus with diabetic chronic kidney disease: Secondary | ICD-10-CM | POA: Diagnosis not present

## 2021-07-01 DIAGNOSIS — I1310 Hypertensive heart and chronic kidney disease without heart failure, with stage 1 through stage 4 chronic kidney disease, or unspecified chronic kidney disease: Secondary | ICD-10-CM | POA: Diagnosis not present

## 2021-07-01 DIAGNOSIS — G8194 Hemiplegia, unspecified affecting left nondominant side: Secondary | ICD-10-CM | POA: Diagnosis not present

## 2021-07-01 DIAGNOSIS — G40209 Localization-related (focal) (partial) symptomatic epilepsy and epileptic syndromes with complex partial seizures, not intractable, without status epilepticus: Secondary | ICD-10-CM | POA: Diagnosis not present

## 2021-07-01 DIAGNOSIS — Z20822 Contact with and (suspected) exposure to covid-19: Secondary | ICD-10-CM | POA: Diagnosis not present

## 2021-07-01 DIAGNOSIS — I119 Hypertensive heart disease without heart failure: Secondary | ICD-10-CM | POA: Diagnosis not present

## 2021-07-01 DIAGNOSIS — E782 Mixed hyperlipidemia: Secondary | ICD-10-CM | POA: Diagnosis not present

## 2021-07-01 DIAGNOSIS — I25119 Atherosclerotic heart disease of native coronary artery with unspecified angina pectoris: Secondary | ICD-10-CM

## 2021-07-01 DIAGNOSIS — Z951 Presence of aortocoronary bypass graft: Secondary | ICD-10-CM | POA: Diagnosis not present

## 2021-07-01 DIAGNOSIS — R531 Weakness: Secondary | ICD-10-CM | POA: Diagnosis not present

## 2021-07-01 DIAGNOSIS — Z7982 Long term (current) use of aspirin: Secondary | ICD-10-CM | POA: Diagnosis not present

## 2021-07-01 DIAGNOSIS — D631 Anemia in chronic kidney disease: Secondary | ICD-10-CM | POA: Diagnosis not present

## 2021-07-01 DIAGNOSIS — J449 Chronic obstructive pulmonary disease, unspecified: Secondary | ICD-10-CM | POA: Diagnosis not present

## 2021-07-01 DIAGNOSIS — K2100 Gastro-esophageal reflux disease with esophagitis, without bleeding: Secondary | ICD-10-CM | POA: Diagnosis not present

## 2021-07-01 DIAGNOSIS — G8929 Other chronic pain: Secondary | ICD-10-CM | POA: Diagnosis not present

## 2021-07-01 DIAGNOSIS — I69952 Hemiplegia and hemiparesis following unspecified cerebrovascular disease affecting left dominant side: Secondary | ICD-10-CM | POA: Diagnosis not present

## 2021-07-01 DIAGNOSIS — N1832 Chronic kidney disease, stage 3b: Secondary | ICD-10-CM | POA: Diagnosis not present

## 2021-07-01 DIAGNOSIS — I10 Essential (primary) hypertension: Secondary | ICD-10-CM | POA: Diagnosis not present

## 2021-07-01 DIAGNOSIS — D72829 Elevated white blood cell count, unspecified: Secondary | ICD-10-CM | POA: Diagnosis not present

## 2021-07-01 DIAGNOSIS — R410 Disorientation, unspecified: Secondary | ICD-10-CM | POA: Diagnosis not present

## 2021-07-01 DIAGNOSIS — I252 Old myocardial infarction: Secondary | ICD-10-CM | POA: Diagnosis not present

## 2021-07-01 DIAGNOSIS — R079 Chest pain, unspecified: Secondary | ICD-10-CM | POA: Diagnosis not present

## 2021-07-01 DIAGNOSIS — E785 Hyperlipidemia, unspecified: Secondary | ICD-10-CM

## 2021-07-01 DIAGNOSIS — I672 Cerebral atherosclerosis: Secondary | ICD-10-CM | POA: Diagnosis not present

## 2021-07-01 DIAGNOSIS — R4182 Altered mental status, unspecified: Secondary | ICD-10-CM | POA: Diagnosis not present

## 2021-07-01 DIAGNOSIS — I6523 Occlusion and stenosis of bilateral carotid arteries: Secondary | ICD-10-CM | POA: Diagnosis not present

## 2021-07-01 DIAGNOSIS — F40240 Claustrophobia: Secondary | ICD-10-CM | POA: Diagnosis not present

## 2021-07-01 DIAGNOSIS — M25512 Pain in left shoulder: Secondary | ICD-10-CM | POA: Diagnosis not present

## 2021-07-01 DIAGNOSIS — F419 Anxiety disorder, unspecified: Secondary | ICD-10-CM | POA: Diagnosis not present

## 2021-07-01 LAB — GLUCOSE, CAPILLARY
Glucose-Capillary: 136 mg/dL — ABNORMAL HIGH (ref 70–99)
Glucose-Capillary: 200 mg/dL — ABNORMAL HIGH (ref 70–99)
Glucose-Capillary: 166 mg/dL — ABNORMAL HIGH (ref 70–99)
Glucose-Capillary: 205 mg/dL — ABNORMAL HIGH (ref 70–99)

## 2021-07-01 LAB — SARS CORONAVIRUS 2 (TAT 6-24 HRS): SARS Coronavirus 2: NEGATIVE

## 2021-07-01 LAB — TROPONIN I (HIGH SENSITIVITY): Troponin I (High Sensitivity): 9 ng/L (ref <18)

## 2021-07-01 LAB — HEMOGLOBIN A1C
Hgb A1c MFr Bld: 6.2 % — ABNORMAL HIGH (ref 4.8–5.6)
Mean Plasma Glucose: 131.24 mg/dL

## 2021-07-01 MED ORDER — ALPRAZOLAM 0.5 MG PO TABS
0.5000 mg | ORAL_TABLET | Freq: Three times a day (TID) | ORAL | Status: DC | PRN
  Administered 2021-07-01: 0.5 mg via ORAL
  Filled 2021-07-01: qty 1

## 2021-07-01 MED ORDER — STROKE: EARLY STAGES OF RECOVERY BOOK: Freq: Once | Status: AC

## 2021-07-01 NOTE — Progress Notes (Signed)
Kidney function is a little low, but that is nothing new. Triglycerides have improved since the last labs, but are still slightly elevated.  I added A1c to check for diabetes.

## 2021-07-01 NOTE — Progress Notes (Signed)
PROGRESS NOTE    [name] [name]  WUJ:[id number] DOB: 11-Jun-1941 DOA: 06/30/2021 PCP: Heather Roberts, NP  Outpatient Specialists: Nona Dell MD    Brief Narrative:   [name] [name] is a 80 year-old female with a history of diabetes, hypertension, CAD s/p 3-vessel CABG in June 2021, COPD, depression presented for left sided weakness and confusion. Patient woke up 7/17 AM with strange feeling in her chest, strange sensation and weakness down her left side. States she fell "lost", and her daughter took her to [facility] ED. In the ED, the presentation was documented as "severe chest pain ,located in the left chest radiating up into left shoulder and down arm." Given her medical history of CAD, chest pain workup was performed, showing negative troponin and stable EKG.  7/18 - Cardiology was consulted and evaluated patient, who recommends no further cardiology workup. However, in review of history of left-sided weakness, confusion and physical exam showing slight drooping on left corner of mouth, stroke workup was initiated. Patient does show 5/5 strength and intact mentation. CT brain without contrast was ordered, pending results; if negative will obtain MRI. Patient was given PRN xanax given self-reported claustrophobia/anxiety   Assessment & Plan:   Active Problems:   Gastro-esophageal reflux disease with esophagitis   Atherosclerotic heart disease of native coronary artery without angina pectoris   Chronic kidney disease, stage 3b (HCC)   Hypertensive heart disease without heart failure   Type 2 diabetes mellitus with diabetic nephropathy (HCC)   Mixed hyperlipidemia   Chest pain  L-sided weakness - in setting of CAD, diabetes with associated mild drooping of mouth on L side, dysphasia at home. Given improving strength and mentation overnight, etiology is likely TIA  - Consult neurology Dr. Gerilyn Pilgrim  -CT negative, plan for MRI - Xanax for patient-reported anxiety with close-spaced  imaging  - Continue home aspirin 81 mg - Continue Lovenox for anticoagulation - BP 100s/50s  - Hold home metoprolol  -2D echocardiogram, lipid panel, hemoglobin A1c -Bilateral carotid ultrasound -PT/OT evaluation  Leukocytosis - WBC up to 11k from 7.0k upon admission, afebrile.  Likely stress response in setting of suspected TIA.  - Will monitor with daily CBC   Anemia - Hgb 11.9 slightly down from 12.6 - Likely anemia of chronic disease in setting of CKD  - Will monitor with daily CBC  CAD s/p CABG June 2021 - following with outpatient cardiology - last visit 7/15, 2 days prior to this hospitalization  - Echo 2021 showed LVEF 55-60% - Cardiology was consulted during this hospitalization initially given presenting symptoms of chest pain; evaluated patient this morning and recommends no further workup from cardiology standpoint.  -No further chest pain  CKD stage 3b - BUN/Cr 25/1.47 slightly up from baseline 1.20s - Will monitor with daily BMP  Dyslipidemia  - 7/15 lipid profile showed total cholesterol 89, low HDL 31, elevated tryglycerides 172  - Continue lipitor 40mg    Diabetes type 2  - BG 168, Hgb a1c 6.2 7/17 - Continue SSI 0-15 units TID w meals  - Continue linagliptin 5mg  PO daily  DVT prophylaxis: Lovenox Code Status: Full Family Communication: daughter was given update over phone Disposition Plan: home    Consultants:  Neurology 8/17) Cardiology )   Procedures:  None  Antimicrobials:  None   Subjective: Patient was up sitting on side of bed. Told medical student that she woke up yesterday with feeling not like herself and "lost." States that she has left sided weakness  where she could not lift her left leg. She also had difficulty getting words out. She told her daughter who told her who took her to the ED. Dr. Diona Browner was also present for the later half of the encounter, who examined her and reviewed her history; recommends that no  cardiology workup was necessary.   Objective: Vitals:   07/01/21 0022 07/01/21 0207 07/01/21 0605 07/01/21 0939  BP:  (!) 111/49 (!) 110/49 (!) 106/51  Pulse:  74 65   Resp: 16 18 20    Temp: 98.3 F (36.8 C) 97.9 F (36.6 C) 98.9 F (37.2 C)   TempSrc: Oral     SpO2:  95% 96%   Weight:      Height:        Intake/Output Summary (Last 24 hours) at 07/01/2021 0955 Last data filed at 07/01/2021 0900 Gross per 24 hour  Intake 720 ml  Output --  Net 720 ml   Filed Weights   06/30/21 1223 06/30/21 2035  Weight: 68 kg 70.8 kg    Examination:  General exam: Appears calm and comfortable, in no acute distress. Respiratory system: Clear to auscultation. Respiratory effort normal. Cardiovascular system: S1 & S2 heard, RRR. No murmurs, rubs, gallops or clicks. No pedal edema. Gastrointestinal system: Abdomen is nondistended, soft and nontender. No organomegaly or masses felt. Normal bowel sounds heard. Central nervous system: Alert and oriented x3. Slight drooping on left corner of mouth. No drooping of upper half of face/eyebrow. Upper and lower extremities showed full ROM. 5/5 strength of hip flexion, knee flexion/extension and dorsiflexion and plantarflexion bilaterally. Sensory intact throughout.  Extremities: No edema, SCDs on bilaterally Skin: No new rashes/lesions Psychiatry: Judgement and insight appear normal. Mood & affect full range    Data Reviewed: I have personally reviewed following labs and imaging studies  CBC: Recent Labs  Lab 06/28/21 1040 06/30/21 1247  WBC 7.6 11.3*  NEUTROABS 4.2  --   HGB 12.6 11.9*  HCT 37.6 35.5*  MCV 95.4 95.2  PLT 231 202   Basic Metabolic Panel: Recent Labs  Lab 06/28/21 1040 06/30/21 1247  NA 138 136  K 4.8 4.6  CL 106 106  CO2 25 24  GLUCOSE 116* 168*  BUN 24* 25*  CREATININE 1.26* 1.47*  CALCIUM 9.3 8.9   GFR: Estimated Creatinine Clearance: 26.6 mL/min (A) (by C-G formula based on SCr of 1.47 mg/dL (H)). Liver  Function Tests: Recent Labs  Lab 06/28/21 1040  AST 26  ALT 30  ALKPHOS 51  BILITOT 0.8  PROT 7.3  ALBUMIN 3.9   No results for input(s): LIPASE, AMYLASE in the last 168 hours. No results for input(s): AMMONIA in the last 168 hours. Coagulation Profile: No results for input(s): INR, PROTIME in the last 168 hours. Cardiac Enzymes: No results for input(s): CKTOTAL, CKMB, CKMBINDEX, TROPONINI in the last 168 hours. BNP (last 3 results) No results for input(s): PROBNP in the last 8760 hours. HbA1C: Recent Labs    06/30/21 1257  HGBA1C 6.2*   CBG: Recent Labs  Lab 06/30/21 2026 07/01/21 0733  GLUCAP 85 136*   Lipid Profile: Recent Labs    06/28/21 1041  CHOL 89  HDL 31*  LDLCALC 24  TRIG 06/30/21*  CHOLHDL 2.9   Thyroid Function Tests: No results for input(s): TSH, T4TOTAL, FREET4, T3FREE, THYROIDAB in the last 72 hours. Anemia Panel: No results for input(s): VITAMINB12, FOLATE, FERRITIN, TIBC, IRON, RETICCTPCT in the last 72 hours. Urine analysis:    Component Value  Date/Time   COLORURINE YELLOW 02/03/2021 1848   APPEARANCEUR CLEAR 02/03/2021 1848   LABSPEC 1.012 02/03/2021 1848   PHURINE 5.0 02/03/2021 1848   GLUCOSEU >=500 (A) 02/03/2021 1848   HGBUR NEGATIVE 02/03/2021 1848   BILIRUBINUR NEGATIVE 02/03/2021 1848   KETONESUR NEGATIVE 02/03/2021 1848   PROTEINUR NEGATIVE 02/03/2021 1848   UROBILINOGEN 0.2 05/30/2010 1903   NITRITE NEGATIVE 02/03/2021 1848   LEUKOCYTESUR NEGATIVE 02/03/2021 1848   Sepsis Labs: @LABRCNTIP (procalcitonin:4,lacticidven:4)  ) Recent Results (from the past 240 hour(s))  SARS CORONAVIRUS 2 (TAT 6-24 HRS) Nasopharyngeal Nasopharyngeal Swab     Status: None   Collection Time: 06/30/21  6:00 PM   Specimen: Nasopharyngeal Swab  Result Value Ref Range Status   SARS Coronavirus 2 NEGATIVE NEGATIVE Final    Comment: (NOTE) SARS-CoV-2 target nucleic acids are NOT DETECTED.  The SARS-CoV-2 RNA is generally detectable in upper and  lower respiratory specimens during the acute phase of infection. Negative results do not preclude SARS-CoV-2 infection, do not rule out co-infections with other pathogens, and should not be used as the sole basis for treatment or other patient management decisions. Negative results must be combined with clinical observations, patient history, and epidemiological information. The expected result is Negative.  Fact Sheet for Patients: 07/02/21  Fact Sheet for Healthcare Providers: HairSlick.no  This test is not yet approved or cleared by the quierodirigir.com FDA and  has been authorized for detection and/or diagnosis of SARS-CoV-2 by FDA under an Emergency Use Authorization (EUA). This EUA will remain  in effect (meaning this test can be used) for the duration of the COVID-19 declaration under Se ction 564(b)(1) of the Act, 21 U.S.C. section 360bbb-3(b)(1), unless the authorization is terminated or revoked sooner.  Performed at [facility], [address], [place], [place]          Radiology Studies: DG Chest Portable 1 View  Result Date: 06/30/2021 CLINICAL DATA:  Left-sided chest pain, onset today. EXAM: PORTABLE CHEST 1 VIEW COMPARISON:  07/13/2020 FINDINGS: Post median sternotomy and CABG. Aortic atherosclerosis. The cardiomediastinal contours are normal. Mild left basilar scarring is stable. Pulmonary vasculature is normal. No consolidation, pleural effusion, or pneumothorax. No acute osseous abnormalities are seen. IMPRESSION: No acute chest findings. Aortic Atherosclerosis (ICD10-I70.0). Electronically Signed   By: 07/15/2020 M.D.   On: 06/30/2021 13:06        Scheduled Meds:  aspirin  324 mg Oral Once   aspirin EC  81 mg Oral Daily   atorvastatin  40 mg Oral QHS   enoxaparin (LOVENOX) injection  30 mg Subcutaneous Q24H   insulin aspart  0-15 Units Subcutaneous TID WC   linagliptin  5  mg Oral Daily   loratadine  10 mg Oral Daily   metoprolol tartrate  12.5 mg Oral BID   Addendum: Patient seen and evaluated and agree with above.  She does merit TIA work-up which has been ordered with neurology consultation pending.  Cardiology has signed off.  PT/OT evaluation with further dispo after this evaluation has been performed.   LOS: 0 days    Time spent: 30 minutes    07/02/2021, Medical student [facility] Pager [phone]  If 7PM-7AM, please contact night-coverage [email or web address] Password [facility] 07/01/2021, 9:55 AM

## 2021-07-01 NOTE — Consult Note (Addendum)
Cardiology Consultation:   Patient ID: [name] [name] MRN: [id number]; DOB: 1941/10/16  Admit date: 06/30/2021 Date of Consult: 07/01/2021  PCP:  [name], NP   [facility] Providers Cardiologist:  Rozann Lesches, MD  Cardiology APP:  Verta Ellen., NP       Patient Profile:   [name] [name] is a 80 y.o. female with a hx of CAD who is being seen 07/01/2021 for the evaluation of chest pain at the request of Dr. Nehemiah Settle.  History of Present Illness:   [name] is a 80 yo female with history of CAD S/P CABG x 3 05/2020, HTN, HLD, DM2, GERD, anxiety.  Patient saw Dr. [name] 06/28/21 and doing well. No changes made.  Yesterday awakened at 9am with left head hurting down into her shoulder, under her left breast. Daughter said she looked pale. Had trouble lifting her left leg up and seemed confused, glucose 118 so daughter brought her to ED.Troponins negative, Crt 1.47.EKG unchanged. Headache lasted awhile, chest pain resolved quickly. Has had chronic left shoulder pain. Not active since her CABG-she's afraid to do anything.    Past Medical History:  Diagnosis Date   Anxiety    CAD (coronary artery disease)    Multivessel status post CABG June 2021 - LIMA to LAD, SVG to distal RCA, SVG to OM1   Chronic obstructive pulmonary disease, unspecified ([place])    Depression    Essential hypertension    GERD    History of pneumonia    Incarcerated ventral hernia    Osteoarthritis    Type 2 diabetes mellitus ([name])     Past Surgical History:  Procedure Laterality Date   CORONARY ARTERY BYPASS GRAFT N/A 05/22/2020   Procedure: CORONARY ARTERY BYPASS GRAFTING (CABG) x3 LIMA TO LAD, SVG TO OM1, SVG TO DISTAL RIGHT;  Surgeon: Lajuana Matte, MD;  Location: [facility];  Service: Open Heart Surgery;  Laterality: N/A;   LEFT HEART CATH AND CORONARY ANGIOGRAPHY N/A 05/21/2020   Procedure: LEFT HEART CATH AND CORONARY ANGIOGRAPHY;  Surgeon: Martinique, Peter M, MD;  Location: [facility]  CV [facility];  Service: Cardiovascular;  Laterality: N/A;   NEPHRECTOMY     TEE WITHOUT CARDIOVERSION N/A 05/22/2020   Procedure: TRANSESOPHAGEAL ECHOCARDIOGRAM (TEE);  Surgeon: Lajuana Matte, MD;  Location: [facility];  Service: Open Heart Surgery;  Laterality: N/A;   VEIN HARVEST Right 05/22/2020   Procedure: Open Vein Harvest;  Surgeon: Lajuana Matte, MD;  Location: [facility];  Service: Open Heart Surgery;  Laterality: Right;   VENTRAL HERNIA REPAIR  09/01/2017   mistaken entry     Home Medications:  Prior to Admission medications   Medication Sig Start Date End Date Taking? Authorizing Provider  Accu-Chek Softclix Lancets lancets Use as instructed 02/12/21  Yes Brita Romp, NP  aspirin EC 81 MG tablet Take 1 tablet (81 mg total) by mouth daily. Swallow whole. 12/25/20  Yes Satira Sark, MD  atorvastatin (LIPITOR) 40 MG tablet TAKE 1 TABLET BY MOUTH AT BEDTIME. 06/27/21  Yes Verta Ellen., NP  Biotin w/ Vitamins C & E (HAIR SKIN & NAILS GUMMIES PO) Take 2 each by mouth every morning.   Yes [provider]  blood glucose meter kit and supplies Dispense based on patient and insurance preference. Use up to four times daily as directed. (FOR ICD-10 E10.9, E11.9). 11/28/20  Yes Perlie Mayo, NP  cetirizine (ZYRTEC) 10 MG tablet Take 10 mg by mouth  daily as needed (allergies).   Yes [provider]  fish oil-omega-3 fatty acids 1000 MG capsule Take 1 g by mouth 2 (two) times daily.   Yes [provider]  glipiZIDE (GLUCOTROL XL) 5 MG 24 hr tablet Take 1 tablet (5 mg total) by mouth daily with breakfast. 04/04/21  Yes [name], NP  glucose blood (ACCU-CHEK GUIDE) test strip Use as instructed to monitor glucose twice daily 02/12/21  Yes Reardon, Juanetta Beets, NP  linagliptin (TRADJENTA) 5 MG TABS tablet Take 1 tablet (5 mg total) by mouth daily. 06/25/21  Yes [name], NP  metFORMIN (GLUCOPHAGE) 500 MG tablet Take 1 tablet (500 mg total) by mouth 2 (two)  times daily with a meal. 11/29/20  Yes Perlie Mayo, NP  metoprolol tartrate (LOPRESSOR) 25 MG tablet TAKE (1/2) TABLET BY MOUTH 2 TIMES A DAY. Patient taking differently: Take 12.5 mg by mouth 2 (two) times daily. 06/14/21  Yes Verta Ellen., NP  Multiple Vitamin (MULITIVITAMIN WITH MINERALS) TABS Take 1 tablet by mouth daily.   Yes [provider]  nitroGLYCERIN (NITROSTAT) 0.4 MG SL tablet Place 1 tablet (0.4 mg total) under the tongue every 5 (five) minutes x 3 doses as needed for chest pain (if no relief after 3rd dose, proceed to the ED for an evaluation or call 911). 08/31/20 08/24/21 Yes Satira Sark, MD  polyethylene glycol (MIRALAX / GLYCOLAX) 17 g packet Take 17 g by mouth daily as needed for mild constipation or moderate constipation.   Yes [provider]    Inpatient Medications: Scheduled Meds:  aspirin  324 mg Oral Once   aspirin EC  81 mg Oral Daily   atorvastatin  40 mg Oral QHS   enoxaparin (LOVENOX) injection  30 mg Subcutaneous Q24H   insulin aspart  0-15 Units Subcutaneous TID WC   linagliptin  5 mg Oral Daily   loratadine  10 mg Oral Daily   metoprolol tartrate  12.5 mg Oral BID   Continuous Infusions:  PRN Meds: acetaminophen, ondansetron (ZOFRAN) IV  Allergies:    Allergies  Allergen Reactions   Contrast Media [Iodinated Diagnostic Agents] Other (See Comments)    Unknown reaction. Due to kidney issues. Do not use   Codeine Rash   Morphine And Related Other (See Comments)    Unknown Reaction   Other     Pt has multiple allergies per pt but unsure of all.    Penicillins Other (See Comments)    Unknown reaction    Social History:   Social History   Tobacco Use   Smoking status: Never   Smokeless tobacco: Never  Substance Use Topics   Alcohol use: No     Family History:     Family History  Problem Relation Age of Onset   CAD Father    Heart failure Sister      ROS:  Please see the history of present illness.   Review of Systems  Constitutional: Negative.  HENT: Negative.    Eyes: Negative.   Cardiovascular:  Positive for chest pain.  Respiratory: Negative.    Hematologic/Lymphatic: Negative.   Musculoskeletal:  Positive for arthritis and myalgias. Negative for joint pain.  Gastrointestinal: Negative.   Genitourinary: Negative.   Neurological:  Positive for focal weakness, headaches and weakness.   All other ROS reviewed and negative.     Physical Exam/Data:   Vitals:   06/30/21 8003 07/01/21 0022 07/01/21 0207 07/01/21 0605  BP: [place] Kitchen)  121/57  (!) 111/49 (!) 110/49  Pulse: 84  74 65  Resp:  _0 Temp:  98.3 F (36.8 C) 97.9 F (36.6 C) 98.9 F (37.2 C)  TempSrc:  Oral    SpO2: 98%  95% 96%  Weight:      Height:        Intake/Output Summary (Last 24 hours) at 07/01/2021 0842 Last data filed at 07/01/2021 0500 Gross per 24 hour  Intake 240 ml  Output --  Net 240 ml   Last 3 Weights 06/30/2021 06/30/2021 06/28/2021  Weight (lbs) 156 lb 1.4 oz 150 lb 155 lb 3.2 oz  Weight (kg) 70.8 kg 68.04 kg 70.398 kg     Body mass index is 31.53 kg/m.  General:  Well nourished, well developed, in no acute distress  HEENT: normal Lymph: no adenopathy Neck: no JVD Endocrine:  No thryomegaly Vascular: No carotid bruits; FA pulses 2+ bilaterally without bruits  Cardiac:  normal S1, S2; RRR; no murmur   Lungs:  clear to auscultation bilaterally, no wheezing, rhonchi or rales  Abd: soft, nontender, no hepatomegaly  Ext: no edema Musculoskeletal:  No deformities, BUE and BLE strength normal and equal Skin: warm and dry  Neuro:  CNs 2-12 intact, no focal abnormalities noted Psych:  Normal affect   EKG:  The EKG was personally reviewed and demonstrates:  NSR with diffuse ST changes unchanged from prior tracings Telemetry:  Telemetry was personally reviewed and demonstrates:  NSR  Relevant CV Studies:  Cardiac catheterization 05/21/2020: Prox LAD to Mid LAD lesion is 95% stenosed. Ost Cx  to Prox Cx lesion is 100% stenosed. Prox RCA lesion is 90% stenosed. Mid RCA lesion is 100% stenosed. LV end diastolic pressure is normal.   1. Critical 3 vessel occlusive CAD.    - 95% proximal to mid LAD    -100% proximal LCx. OM1 and OM2 fill by left to left collaterals    - 100% mid RCA. Distal vessel fills by right to right and left to right collaterals 2. Normal LVEDP   Echocardiogram 05/18/2020:  1. Left ventricular ejection fraction, by estimation, is 55 to 60%. The  left ventricle has normal function. The left ventricle has no regional  wall motion abnormalities. There is mild left ventricular hypertrophy.  Left ventricular diastolic parameters  are indeterminate.   2. Right ventricular systolic function is normal. The right ventricular  size is normal.   3. Left atrial size was mildly dilated.   4. The mitral valve is normal in structure. Trivial mitral valve  regurgitation. No evidence of mitral stenosis.   5. The aortic valve has an indeterminant number of cusps. Aortic valve  regurgitation is not visualized. No aortic stenosis is present.   6. The inferior vena cava is normal in size with greater than 50%  respiratory variability, suggesting right atrial pressure of 3 mmHg.  Laboratory Data:  High Sensitivity Troponin:   Recent Labs  Lab 06/30/21 1247 06/30/21 1424 06/30/21 2155 07/01/21 0513  TROPONINIHS _1 Chemistry Recent Labs  Lab 06/28/21 1040 06/30/21 1247  NA 138 136  K 4.8 4.6  CL 106 106  CO2 25 24  GLUCOSE 116* 168*  BUN 24* 25*  CREATININE 1.26* 1.47*  CALCIUM 9.3 8.9  GFRNONAA 43* 36*  ANIONGAP 7 6    Recent Labs  Lab 06/28/21 1040  PROT 7.3  ALBUMIN 3.9  AST 26  ALT 30  ALKPHOS 51  BILITOT 0.8   Hematology Recent Labs  Lab 06/28/21 1040 06/30/21 1247  WBC 7.6 11.3*  RBC 3.94 3.73*  HGB 12.6 11.9*  HCT 37.6 35.5*  MCV 95.4 95.2  MCH 32.0 31.9  MCHC 33.5 33.5  RDW 12.0 12.0  PLT 231 202     Radiology/Studies:  DG Chest Portable 1 View  Result Date: 06/30/2021 CLINICAL DATA:  Left-sided chest pain, onset today. EXAM: PORTABLE CHEST 1 VIEW COMPARISON:  07/13/2020 FINDINGS: Post median sternotomy and CABG. Aortic atherosclerosis. The cardiomediastinal contours are normal. Mild left basilar scarring is stable. Pulmonary vasculature is normal. No consolidation, pleural effusion, or pneumothorax. No acute osseous abnormalities are seen. IMPRESSION: No acute chest findings. Aortic Atherosclerosis (ICD10-I70.0). Electronically Signed   By: Keith Rake M.D.   On: 06/30/2021 13:06     Assessment and Plan:   Left sided headache & fullness sensation/shoulder pain/Chest pain/left leg weakness ? TIA. No further chest pain, troponins negative/EKG unchanged. Has had fullness sensation left side of head. Would ask neuro to see.   CAD S/P CABG 05/2020-chest pain with above associated symptoms. Troponins negative. No need for further ischemic work up at this time.  HTN controlled on metoprolol  HLD LDL 24 on Lipitor  DM2 on tradjenta  History of nephrectomy-Crt 1.47-was 1.26 06/28/21   Risk Assessment/Risk Scores:     HEAR Score (for undifferentiated chest pain):         For questions or updates, please contact [place] Please consult [email or web address] for contact info under    Signed, Ermalinda Barrios, PA-C  07/01/2021 8:42 AM   Patient seen and examined. She is well known to me, was just in the office this past Friday for routine visit and doing well at that time. Case discussed with Ms. Bonnell Public PA-C. Ms. Brumm now presents stating she awoke with pain in the left side of her head, also left shoulder and breast, then trouble moving left leg and some apparent disorientation prompting her daughter to call EMS. She states of compliance with her medications.  Currently without active symptoms. Afebrile, SBP 110-130, HR 70's in sinus rhythm by telemetry. Lungs are clear, cardiac  exam with RRR and 1/6 systolic murmur, no gallop. Moves all extremties, some difficulty with left leg due to hip pain. No clear facial asymmetry.  Pertinent lab work shows potassium 4.6, BUN 24, creatinine 1.47, normal HS troponin x 4, LDL 24, Hgb 11.9, platelets 202, HgbA1c 6.2.  ECG shows sinus rhythm with diffuse NSST abnormalities which are old. CXR shows no acute process.  Symptoms warrant further neurological evaluation, suggest Neurology consultation and brain MRI. No clear evidence of ACS, although angina with known CAD not excluded - would manage medically.  Satira Sark, M.D., F.A.C.C.

## 2021-07-01 NOTE — Consult Note (Signed)
[place] A. [name] Laughter, MD     [email or web address]          [name] [name] is an 80 y.o. female.   ASSESSMENT/PLAN: 1.  Acute confusional state with the semiology most consistent and concerning for complex partial seizure.  Consequently, EEG will be obtained.  Imaging failed to show stroke or other potential etiology to explain the confusion. 2.  Remote left occipital small cortical infarct.  Continue with aspirin along with blood pressure and blood sugar control.    This is a 80 year old female who presents with confusion on awakening yesterday.  She reports that she just did not feel right and was disoriented.  She seems to have some amnesia of the event yesterday morning.  She does report being in "lost" however.  The daughter was concerned and decided to seek medical attention.  It appeared that she had weakness on the right side in particular right leg weakness.  The patient has had some chronic weakness of the left upper extremity after coronary bypass surgery a year ago.  She reports reduced range of motion, pain and general weakness of the left side.  This may have gotten worse yesterday which prompted a cardiac work-up.  She has had some mild ongoing chest discomfort that is chronic however.  She also reports having new right upper extremity weakness.  The review of systems otherwise unremarkable.   GENERAL: This is a pleasant somewhat overweight female who is doing well at this time.  She is in no acute distress.  HEENT: Neck is supple no trauma noted.  ABDOMEN: soft  EXTREMITIES: No edema; significant arthritic changes of the knees bilaterally.  There is reduced range of motion of the left shoulder to about 45 degrees.  BACK: Normal  SKIN: Normal by inspection.    MENTAL STATUS: Alert and oriented. Speech, language and cognition are generally intact. Judgment and insight normal.   CRANIAL NERVES: Pupils are equal, round and reactive to light and  accomodation; extra ocular movements are full, there is no significant nystagmus; visual fields are full; upper and lower facial muscles are normal in strength and symmetric, there is no flattening of the nasolabial folds; tongue is midline; uvula is midline; shoulder elevation is normal.  MOTOR: Normal tone, bulk and strength; no pronator drift.  COORDINATION: Left finger to nose is normal, right finger to nose is normal, No rest tremor; no intention tremor; no postural tremor; no bradykinesia.  REFLEXES: Deep tendon reflexes are symmetrical and normal.   SENSATION: Normal to light touch, temperature, and pain.      Blood pressure (!) 91/51, pulse 67, temperature 97.8 F (36.6 C), temperature source Oral, resp. rate 19, height 4' 11"  (1.499 m), weight 70.8 kg, SpO2 97 %.  Past Medical History:  Diagnosis Date   Anxiety    CAD (coronary artery disease)    Multivessel status post CABG June 2021 - LIMA to LAD, SVG to distal RCA, SVG to OM1   Chronic obstructive pulmonary disease, unspecified ([place])    Depression    Essential hypertension    GERD    History of pneumonia    Incarcerated ventral hernia    Osteoarthritis    Type 2 diabetes mellitus ([place])     Past Surgical History:  Procedure Laterality Date   CORONARY ARTERY BYPASS GRAFT N/A 05/22/2020   Procedure: CORONARY ARTERY BYPASS GRAFTING (CABG) x3 LIMA TO LAD, SVG TO OM1, SVG TO DISTAL RIGHT;  Surgeon: Lajuana Matte, MD;  Location:  [facility];  Service: Open Heart Surgery;  Laterality: N/A;   LEFT HEART CATH AND CORONARY ANGIOGRAPHY N/A 05/21/2020   Procedure: LEFT HEART CATH AND CORONARY ANGIOGRAPHY;  Surgeon: Martinique, Peter M, MD;  Location: [facility];  Service: Cardiovascular;  Laterality: N/A;   NEPHRECTOMY     TEE WITHOUT CARDIOVERSION N/A 05/22/2020   Procedure: TRANSESOPHAGEAL ECHOCARDIOGRAM (TEE);  Surgeon: Lajuana Matte, MD;  Location: [facility];  Service: Open Heart Surgery;  Laterality: N/A;   VEIN HARVEST  Right 05/22/2020   Procedure: Open Vein Harvest;  Surgeon: Lajuana Matte, MD;  Location: [facility];  Service: Open Heart Surgery;  Laterality: Right;   VENTRAL HERNIA REPAIR  09/01/2017   mistaken entry    Family History  Problem Relation Age of Onset   CAD Father    Heart failure Sister     Social History:  reports that she has never smoked. She has never used smokeless tobacco. She reports that she does not drink alcohol and does not use drugs.  Allergies:  Allergies  Allergen Reactions   Contrast Media [Iodinated Diagnostic Agents] Other (See Comments)    Unknown reaction. Due to kidney issues. Do not use   Codeine Rash   Morphine And Related Other (See Comments)    Unknown Reaction   Other     Pt has multiple allergies per pt but unsure of all.    Penicillins Other (See Comments)    Unknown reaction    Medications: Prior to Admission medications   Medication Sig Start Date End Date Taking? Authorizing Provider  Accu-Chek Softclix Lancets lancets Use as instructed 02/12/21  Yes Brita Romp, NP  aspirin EC 81 MG tablet Take 1 tablet (81 mg total) by mouth daily. Swallow whole. 12/25/20  Yes Satira Sark, MD  atorvastatin (LIPITOR) 40 MG tablet TAKE 1 TABLET BY MOUTH AT BEDTIME. 06/27/21  Yes Verta Ellen., NP  Biotin w/ Vitamins C & E (HAIR SKIN & NAILS GUMMIES PO) Take 2 each by mouth every morning.   Yes [provider]  blood glucose meter kit and supplies Dispense based on patient and insurance preference. Use up to four times daily as directed. (FOR ICD-10 E10.9, E11.9). 11/28/20  Yes Perlie Mayo, NP  cetirizine (ZYRTEC) 10 MG tablet Take 10 mg by mouth daily as needed (allergies).   Yes [provider]  fish oil-omega-3 fatty acids 1000 MG capsule Take 1 g by mouth 2 (two) times daily.   Yes [provider]  glipiZIDE (GLUCOTROL XL) 5 MG 24 hr tablet Take 1 tablet (5 mg total) by mouth daily with breakfast. 04/04/21  Yes  [name], NP  glucose blood (ACCU-CHEK GUIDE) test strip Use as instructed to monitor glucose twice daily 02/12/21  Yes Reardon, Juanetta Beets, NP  linagliptin (TRADJENTA) 5 MG TABS tablet Take 1 tablet (5 mg total) by mouth daily. 06/25/21  Yes [name], NP  metFORMIN (GLUCOPHAGE) 500 MG tablet Take 1 tablet (500 mg total) by mouth 2 (two) times daily with a meal. 11/29/20  Yes Perlie Mayo, NP  metoprolol tartrate (LOPRESSOR) 25 MG tablet TAKE (1/2) TABLET BY MOUTH 2 TIMES A DAY. Patient taking differently: Take 12.5 mg by mouth 2 (two) times daily. 06/14/21  Yes Verta Ellen., NP  Multiple Vitamin (MULITIVITAMIN WITH MINERALS) TABS Take 1 tablet by mouth daily.   Yes [provider]  nitroGLYCERIN (NITROSTAT) 0.4 MG SL tablet Place 1  tablet (0.4 mg total) under the tongue every 5 (five) minutes x 3 doses as needed for chest pain (if no relief after 3rd dose, proceed to the ED for an evaluation or call 911). 08/31/20 08/24/21 Yes Satira Sark, MD  polyethylene glycol (MIRALAX / GLYCOLAX) 17 g packet Take 17 g by mouth daily as needed for mild constipation or moderate constipation.   Yes [provider]    Scheduled Meds:  aspirin  324 mg Oral Once   aspirin EC  81 mg Oral Daily   atorvastatin  40 mg Oral QHS   enoxaparin (LOVENOX) injection  30 mg Subcutaneous Q24H   insulin aspart  0-15 Units Subcutaneous TID WC   linagliptin  5 mg Oral Daily   loratadine  10 mg Oral Daily   Continuous Infusions: PRN Meds:.acetaminophen, ALPRAZolam, ondansetron (ZOFRAN) IV     Results for orders placed or performed during the hospital encounter of 06/30/21 (from the past 48 hour(s))  Troponin I (High Sensitivity)     Status: None   Collection Time: 06/30/21 12:47 PM  Result Value Ref Range   Troponin I (High Sensitivity) 7 <18 ng/L    Comment: (NOTE) Elevated high sensitivity troponin I (hsTnI) values and significant  changes across serial measurements may suggest  ACS but many other  chronic and acute conditions are known to elevate hsTnI results.  Refer to the "Links" section for chest pain algorithms and additional  guidance. Performed at [facility], [address] [place], [place] [address]   CBC     Status: Abnormal   Collection Time: 06/30/21 12:47 PM  Result Value Ref Range   WBC 11.3 (H) 4.0 - 10.5 K/uL   RBC 3.73 (L) 3.87 - 5.11 MIL/uL   Hemoglobin 11.9 (L) 12.0 - 15.0 g/dL   HCT 35.5 (L) 36.0 - 46.0 %   MCV 95.2 80.0 - 100.0 fL   MCH 31.9 26.0 - 34.0 pg   MCHC 33.5 30.0 - 36.0 g/dL   RDW 12.0 11.5 - 15.5 %   Platelets 202 150 - 400 K/uL   nRBC 0.0 0.0 - 0.2 %    Comment: Performed at [facility], [address], [place], [place] [address]  Basic metabolic panel     Status: Abnormal   Collection Time: 06/30/21 12:47 PM  Result Value Ref Range   Sodium 136 135 - 145 mmol/L   Potassium 4.6 3.5 - 5.1 mmol/L   Chloride 106 98 - 111 mmol/L   CO2 24 22 - 32 mmol/L   Glucose, Bld 168 (H) 70 - 99 mg/dL    Comment: Glucose reference range applies only to samples taken after fasting for at least 8 hours.   BUN 25 (H) 8 - 23 mg/dL   Creatinine, Ser 1.47 (H) 0.44 - 1.00 mg/dL   Calcium 8.9 8.9 - 10.3 mg/dL   GFR, Estimated 36 (L) >60 mL/min    Comment: (NOTE) Calculated using the CKD-EPI Creatinine Equation (2021)    Anion gap 6 5 - 15    Comment: Performed at [facility], [address], [place], [place] [address]  Hemoglobin A1c     Status: Abnormal   Collection Time: 06/30/21 12:57 PM  Result Value Ref Range   Hgb A1c MFr Bld 6.2 (H) 4.8 - 5.6 %    Comment: (NOTE) Pre diabetes:          5.7%-6.4%  Diabetes:              >  6.4%  Glycemic control for   <7.0% adults with diabetes    Mean Plasma Glucose 131.24 mg/dL    Comment: Performed at [facility] [address] [place], [place] [address]  Troponin I (High Sensitivity)     Status: None   Collection Time: 06/30/21  2:24 PM  Result Value Ref Range   Troponin I  (High Sensitivity) 8 <18 ng/L    Comment: (NOTE) Elevated high sensitivity troponin I (hsTnI) values and significant  changes across serial measurements may suggest ACS but many other  chronic and acute conditions are known to elevate hsTnI results.  Refer to the "Links" section for chest pain algorithms and additional  guidance. Performed at [facility], [address], [place], [place] [address]   SARS CORONAVIRUS 2 (TAT 6-24 HRS) Nasopharyngeal Nasopharyngeal Swab     Status: None   Collection Time: 06/30/21  6:00 PM   Specimen: Nasopharyngeal Swab  Result Value Ref Range   SARS Coronavirus 2 NEGATIVE NEGATIVE    Comment: (NOTE) SARS-CoV-2 target nucleic acids are NOT DETECTED.  The SARS-CoV-2 RNA is generally detectable in upper and lower respiratory specimens during the acute phase of infection. Negative results do not preclude SARS-CoV-2 infection, do not rule out co-infections with other pathogens, and should not be used as the sole basis for treatment or other patient management decisions. Negative results must be combined with clinical observations, patient history, and epidemiological information. The expected result is Negative.  Fact Sheet for Patients: SugarRoll.be  Fact Sheet for Healthcare Providers: https://www.woods-mathews.com/  This test is not yet approved or cleared by the [place] FDA and  has been authorized for detection and/or diagnosis of SARS-CoV-2 by FDA under an Emergency Use Authorization (EUA). This EUA will remain  in effect (meaning this test can be used) for the duration of the COVID-19 declaration under Se ction 564(b)(1) of the Act, 21 U.S.C. section 360bbb-3(b)(1), unless the authorization is terminated or revoked sooner.  Performed at [facility], [place] [address] [place], [place] [address]   Glucose, capillary     Status: None   Collection Time: 06/30/21  8:26 PM  Result Value Ref  Range   Glucose-Capillary 85 70 - 99 mg/dL    Comment: Glucose reference range applies only to samples taken after fasting for at least 8 hours.   Comment 1 Notify RN    Comment 2 Document in Chart   Troponin I (High Sensitivity)     Status: None   Collection Time: 06/30/21  9:55 PM  Result Value Ref Range   Troponin I (High Sensitivity) 9 <18 ng/L    Comment: (NOTE) Elevated high sensitivity troponin I (hsTnI) values and significant  changes across serial measurements may suggest ACS but many other  chronic and acute conditions are known to elevate hsTnI results.  Refer to the "Links" section for chest pain algorithms and additional  guidance. Performed at [facility], [address], [place], [place] [address]   Troponin I (High Sensitivity)     Status: None   Collection Time: 07/01/21  5:13 AM  Result Value Ref Range   Troponin I (High Sensitivity) 9 <18 ng/L    Comment: (NOTE) Elevated high sensitivity troponin I (hsTnI) values and significant  changes across serial measurements may suggest ACS but many other  chronic and acute conditions are known to elevate hsTnI results.  Refer to the "Links" section for chest pain algorithms and additional  guidance. Performed at [facility], [address], [place], [place] [address]   Glucose, capillary     Status: Abnormal   Collection Time: 07/01/21  7:33 AM  Result Value Ref Range   Glucose-Capillary 136 (H) 70 - 99 mg/dL    Comment: Glucose reference range applies only to samples taken after fasting for at least 8 hours.  Glucose, capillary     Status: Abnormal   Collection Time: 07/01/21 11:12 AM  Result Value Ref Range   Glucose-Capillary 200 (H) 70 - 99 mg/dL    Comment: Glucose reference range applies only to samples taken after fasting for at least 8 hours.  Glucose, capillary     Status: Abnormal   Collection Time: 07/01/21  4:15 PM  Result Value Ref Range   Glucose-Capillary 166 (H) 70 - 99 mg/dL    Comment: Glucose  reference range applies only to samples taken after fasting for at least 8 hours.    Studies/Results:  CAROTID DOPPLERS    IMPRESSION: 1. Right carotid artery system: Less than 50% stenosis secondary to mild multifocal atherosclerotic plaque formation.   2. Left carotid artery system: Less than 50% stenosis secondary to moderate multifocal atherosclerotic plaque formation about the carotid bulb.   3.  Vertebral artery system: Patent with antegrade flow bilaterally.      BRAIN MRI   FINDINGS: Brain:   Mild generalized cerebral and cerebellar atrophy.   Small chronic cortical infarct within the left occipital lobe (series 18, image 9).   Mild-to-moderate multifocal T2/FLAIR hyperintensity within the cerebral white matter, nonspecific but compatible chronic small vessel ischemic disease.   Tiny chronic infarct within the left cerebellar hemisphere (series 16, image 4).   There is no acute infarct.   No evidence of an intracranial mass.   No chronic intracranial blood products.   No extra-axial fluid collection.   No midline shift.   Vascular: Expected proximal arterial flow voids.   Skull and upper cervical spine: No focal marrow lesion.   Sinuses/Orbits: Visualized orbits show no acute finding. Mild mucosal thickening within the left maxillary sinus.   Other: Trace fluid within the left mastoid air cells.   IMPRESSION: No evidence of acute intracranial abnormality.   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   Mild-to-moderate chronic small vessel ischemic changes within the cerebral white matter.   Tiny chronic infarct within the left cerebellar hemisphere.   Mild generalized parenchymal atrophy.   Mild left maxillary sinus mucosal thickening.   Trace fluid within the left mastoid air cells.      The brain MRI is reviewed in person and shows moderate confluent leukoencephalopathy especially involving the posterior horn of  the lateral ventricles. There is also deep white matter changes. There is area of tiny increased signal involving the occipital lobe. This appears to be a potential area of small remote infarct. No hemorrhage noted. No acute changes noted.    [place] A. [name] [name], M.D.  Diplomate, Tax adviser of Psychiatry and Neurology ( Neurology). 07/01/2021, 5:47 PM

## 2021-07-02 ENCOUNTER — Inpatient Hospital Stay (HOSPITAL_COMMUNITY)
Admit: 2021-07-02 | Discharge: 2021-07-02 | Disposition: A | Discharge status: Home / Self Care | Payer: Medicare Other | Attending: Neurology | Admitting: Neurology

## 2021-07-02 ENCOUNTER — Inpatient Hospital Stay (HOSPITAL_COMMUNITY): Payer: Medicare Other

## 2021-07-02 DIAGNOSIS — G459 Transient cerebral ischemic attack, unspecified: Secondary | ICD-10-CM

## 2021-07-02 LAB — BASIC METABOLIC PANEL
Anion gap: 8 (ref 5–15)
BUN: 28 mg/dL — ABNORMAL HIGH (ref 8–23)
CO2: 25 mmol/L (ref 22–32)
Calcium: 8.6 mg/dL — ABNORMAL LOW (ref 8.9–10.3)
Chloride: 104 mmol/L (ref 98–111)
Creatinine, Ser: 1.52 mg/dL — ABNORMAL HIGH (ref 0.44–1.00)
GFR, Estimated: 35 mL/min — ABNORMAL LOW (ref >60)
Glucose, Bld: 167 mg/dL — ABNORMAL HIGH (ref 70–99)
Potassium: 4.5 mmol/L (ref 3.5–5.1)
Sodium: 137 mmol/L (ref 135–145)

## 2021-07-02 LAB — CBC
HCT: 35.4 % — ABNORMAL LOW (ref 36.0–46.0)
Hemoglobin: 11.7 g/dL — ABNORMAL LOW (ref 12.0–15.0)
MCH: 31.9 pg (ref 26.0–34.0)
MCHC: 33.1 g/dL (ref 30.0–36.0)
MCV: 96.5 fL (ref 80.0–100.0)
Platelets: 188 10*3/uL (ref 150–400)
RBC: 3.67 MIL/uL — ABNORMAL LOW (ref 3.87–5.11)
RDW: 12.1 % (ref 11.5–15.5)
WBC: 7.5 10*3/uL (ref 4.0–10.5)
nRBC: 0 % (ref 0.0–0.2)

## 2021-07-02 LAB — ECHOCARDIOGRAM COMPLETE
Area-P 1/2: 2.91 cm2
Height: 59 in
S' Lateral: 2.69 cm
Weight: 2497.37 oz

## 2021-07-02 LAB — GLUCOSE, CAPILLARY
Glucose-Capillary: 186 mg/dL — ABNORMAL HIGH (ref 70–99)
Glucose-Capillary: 170 mg/dL — ABNORMAL HIGH (ref 70–99)
Glucose-Capillary: 163 mg/dL — ABNORMAL HIGH (ref 70–99)

## 2021-07-02 LAB — MAGNESIUM: Magnesium: 1.6 mg/dL — ABNORMAL LOW (ref 1.7–2.4)

## 2021-07-02 NOTE — Evaluation (Signed)
Physical Therapy Evaluation Patient Details Name: [name] [name] MRN: [id number] DOB: [birth year] Today's Date: 07/02/2021   History of Present Illness  [name] [name] is a 80 y.o. female who presents with severe chest pain, located in L chest radiating up into L shoulder and down arm. She had a headache and appears somewhat diaphoretic and pale.  MRI negative for acute infarct. PMH: HTN, diabetes, CAD s/p three-vessel CABG in June 2021, COPD, depression.  Clinical Impression  Pt admitted with above diagnosis. Pt independent at baseline, lives with daughter, denies recent falls. Pt currently ambulates 160 ft without DME, supv for safety. Pt reports a "natural feeling" of wanting to hunch to the R, but denies pushing/pulling sensations and no drifting/veering noted. Pt familiar with PT exercises and motivated to return home with daughter to assist as needed. Pt currently with functional limitations due to the deficits listed below (see PT Problem List). Pt will benefit from skilled PT to increase their independence and safety with mobility to allow discharge to the venue listed below.       Follow Up Recommendations No PT follow up;Supervision - Intermittent    Equipment Recommendations  None recommended by PT    Recommendations for Other Services       Precautions / Restrictions Precautions Precautions: Fall Restrictions Weight Bearing Restrictions: No      Mobility  Bed Mobility  General bed mobility comments: in chair upon arrival    Transfers Overall transfer level: Needs assistance Equipment used: None Transfers: Sit to/from Stand Sit to Stand: Supervision    General transfer comment: BUE assisting to power to stand, good steadiness  Ambulation/Gait Ambulation/Gait assistance: Supervision Gait Distance (Feet): 160 Feet Assistive device: None Gait Pattern/deviations: Step-through pattern;Decreased stride length Gait velocity: decreased   General Gait Details: step  through pattern and steady gait, equal bil step length and good foot clearance, reports feels a tendency to lean R but no drifting/veering noted  Stairs            Wheelchair Mobility    Modified Rankin (Stroke Patients Only)       Balance Overall balance assessment: Mild deficits observed, not formally tested        Pertinent Vitals/Pain Pain Assessment: Faces Faces Pain Scale: Hurts a little bit Pain Location: back and R side of head Pain Descriptors / Indicators:  ("air shooting" and "buzzing") Pain Intervention(s): Limited activity within patient's tolerance;Monitored during session    Home Living Family/patient expects to be discharged to:: Private residence Living Arrangements: Children (living with dtr since CABG in 6/21) Available Help at Discharge: Family;Available PRN/intermittently Type of Home: House Home Access: Stairs to enter Entrance Stairs-Rails: Right;Left;Can reach both Entrance Stairs-Number of Steps: 4 Home Layout: One level Home Equipment: None      Prior Function Level of Independence: Independent  Comments: Pt reports independence in ADLs and community ambulation . Does not use any DME, denies falls. Daughter provides transportation.     Hand Dominance   Dominant Hand: Right    Extremity/Trunk Assessment   Upper Extremity Assessment Upper Extremity Assessment: Defer to OT evaluation    Lower Extremity Assessment Lower Extremity Assessment: Overall WFL for tasks assessed (AROM WNL, strength 4/5 throughout, symmetrical, heel to shin test symmetrical and without deficit, denies numbness/tingling)    Cervical / Trunk Assessment Cervical / Trunk Assessment: Normal  Communication   Communication: No difficulties  Cognition Arousal/Alertness: Awake/alert Behavior During Therapy: WFL for tasks assessed/performed Overall Cognitive Status: Within Functional Limits for tasks  assessed      General Comments      Exercises      Assessment/Plan    PT Assessment Patient needs continued PT services  PT Problem List Decreased strength;Decreased activity tolerance;Decreased balance;Decreased knowledge of use of DME;Cardiopulmonary status limiting activity;Obesity       PT Treatment Interventions DME instruction;Gait training;Functional mobility training;Therapeutic activities;Therapeutic exercise;Balance training;Patient/family education    PT Goals (Current goals can be found in the Care Plan section)  Acute Rehab PT Goals Patient Stated Goal: home with daughter PT Goal Formulation: With patient Time For Goal Achievement: 07/16/21 Potential to Achieve Goals: Good    Frequency Min 3X/week   Barriers to discharge        Co-evaluation               AM-PAC PT "6 Clicks" Mobility  Outcome Measure Help needed turning from your back to your side while in a flat bed without using bedrails?: None Help needed moving from lying on your back to sitting on the side of a flat bed without using bedrails?: A Little Help needed moving to and from a bed to a chair (including a wheelchair)?: A Little Help needed standing up from a chair using your arms (e.g., wheelchair or bedside chair)?: A Little Help needed to walk in hospital room?: A Little Help needed climbing 3-5 steps with a railing? : A Little 6 Click Score: 19    End of Session Equipment Utilized During Treatment: Gait belt Activity Tolerance: Patient tolerated treatment well Patient left: in chair;with call bell/phone within reach Nurse Communication: Mobility status PT Visit Diagnosis: Other abnormalities of gait and mobility (R26.89)    Time: 7017-7939 PT Time Calculation (min) (ACUTE ONLY): 20 min   Charges:   PT Evaluation $PT Eval Low Complexity: 1 Low           Tori Rokia Bosket PT, DPT 07/02/21, 12:16 PM

## 2021-07-02 NOTE — Progress Notes (Signed)
Patient receiving a EEG at this time,unable to do Stroke and Neuro checks,also vital signs.

## 2021-07-02 NOTE — Progress Notes (Signed)
  Echocardiogram 2D Echocardiogram has been performed.  [name] [name] 07/02/2021, 1:12 PM

## 2021-07-02 NOTE — Evaluation (Signed)
Occupational Therapy Evaluation Patient Details Name: [name] [name] MRN: [id number] DOB: 05/11/1941 Today's Date: 07/02/2021    History of Present Illness [name] [name] is a 80 y.o. female with a history of diabetes, hypertension, coronary artery disease status post three-vessel CABG in June 2021, COPD, depression.  Patient woke up with severe chest pain this morning, located in the left chest radiating up into the left shoulder and down arm.  She had a headache and appears somewhat diaphoretic and pale.  She tried to eat some yogurt, but when the pain persisted, she was brought to the hospital for evaluation. Cardiology has cleared pt, recommended neurology assessment. MRI negative for acute infarct   Clinical Impression   Pt agreeable to OT evaluation this am. Pt reports she has been staying with her daughter since her CABG and plans to return to her home eventually but is unsure of when that will be. Pt performing ADLs and functional mobility at supervision level today. Appears to be at baseline with functional tasks. Pt reports HA along back and right side of head this morning, low pain level, reports she is unsure how to describe the sensation. BUE strength is equivalent at 4+/5, no unilateral weakness. No further OT services required at this time.     Follow Up Recommendations  No OT follow up;Supervision - Intermittent    Equipment Recommendations  None recommended by OT       Precautions / Restrictions Precautions Precautions: Fall Restrictions Weight Bearing Restrictions: No      Mobility Bed Mobility Overal bed mobility: Needs Assistance Bed Mobility: Supine to Sit     Supine to sit: Supervision     General bed mobility comments: Increased time    Transfers Overall transfer level: Needs assistance Equipment used: None Transfers: Sit to/from Stand Sit to Stand: Supervision                  ADL either performed or assessed with clinical judgement   ADL  Overall ADL's : Needs assistance/impaired Eating/Feeding: Modified independent;Sitting Eating/Feeding Details (indicate cue type and reason): Pt preparing and eating breakfast independently Grooming: Wash/dry hands;Supervision/safety;Standing Grooming Details (indicate cue type and reason): Pt standing at sink for grooming, no difficulty             Lower Body Dressing: Modified independent;Sitting/lateral leans Lower Body Dressing Details (indicate cue type and reason): Pt donning shoes while seated at EOB Toilet Transfer: Supervision/safety;Ambulation;Regular Toilet   Toileting- Architect and Hygiene: Supervision/safety;Sit to/from stand Toileting - Clothing Manipulation Details (indicate cue type and reason): Pt doffing and donning undergarments while standing     Functional mobility during ADLs: Supervision/safety General ADL Comments: Pt performing ADLs at supervision level     Vision Baseline Vision/History: Wears glasses Wears Glasses: Reading only Patient Visual Report: No change from baseline Vision Assessment?: No apparent visual deficits            Pertinent Vitals/Pain Pain Assessment: 0-10 Pain Score: 2  Pain Location: back of head Pain Descriptors / Indicators: Aching ("feels like air is coming out") Pain Intervention(s): Limited activity within patient's tolerance;Monitored during session;Repositioned     Hand Dominance Right   Extremity/Trunk Assessment Upper Extremity Assessment Upper Extremity Assessment: Overall WFL for tasks assessed (BUE strength is 4+/5)   Lower Extremity Assessment Lower Extremity Assessment: Defer to PT evaluation   Cervical / Trunk Assessment Cervical / Trunk Assessment: Normal   Communication Communication Communication: No difficulties   Cognition Arousal/Alertness: Awake/alert Behavior During Therapy: [facility]  for tasks assessed/performed Overall Cognitive Status: Within Functional Limits for tasks assessed                                                 Home Living Family/patient expects to be discharged to:: Private residence Living Arrangements: Children (living with dtr since CABG in 6/21) Available Help at Discharge: Family;Available PRN/intermittently Type of Home: House Home Access: Stairs to enter [employer] of Steps: 4 Entrance Stairs-Rails: Right;Left;Can reach both Home Layout: One level     Bathroom Shower/Tub: Chief Strategy Officer: Standard     Home Equipment: None          Prior Functioning/Environment Level of Independence: Independent        Comments: Pt reports independence in ADLs and functional mobility. Does not use any DME        OT Problem List: Decreased activity tolerance;Decreased knowledge of use of DME or AE       End of Session    Activity Tolerance: Patient tolerated treatment well Patient left: in bed;with call bell/phone within reach;with bed alarm set  OT Visit Diagnosis: Muscle weakness (generalized) (M62.81);Unsteadiness on feet (R26.81)                Time: 5732-2025 OT Time Calculation (min): 21 min Charges:  OT General Charges $OT Visit: 1 Visit OT Evaluation $OT Eval Low Complexity: 1 Low   Ezra Sites, OTR/L  [phone] 07/02/2021, 7:57 AM

## 2021-07-02 NOTE — Progress Notes (Signed)
PROGRESS NOTE    [name] [name]  XTK:[id number] DOB: 1941/01/21 DOA: 06/30/2021 PCP: Heather Roberts, NP  Outpatient Specialists: Nona Dell MD      Brief Narrative:   [name] [name] is a 80 year-old female with a history of diabetes, hypertension, CAD s/p 3-vessel CABG in June 2021, COPD, depression presented for left sided weakness and confusion. Patient woke up 7/17 AM with strange feeling in her chest, strange sensation and weakness down her left side. States she fell "lost", and her daughter took her to [facility] ED. In the ED, the presentation was documented as "severe chest pain ,located in the left chest radiating up into left shoulder and down arm." Given her medical history of CAD, chest pain workup was performed, showing negative troponin and stable EKG.   7/18 - Cardiology was consulted and evaluated patient, who recommends no further cardiology workup. However, in review of history of left-sided weakness, confusion and physical exam showing slight drooping on left corner of mouth, stroke workup was initiated. Patient does show 5/5 strength and intact mentation. CT brain without contrast was ordered, pending results; if negative will obtain MRI. Patient was given PRN xanax given self-reported claustrophobia/anxiety  7/19 - All TIA/stroke workup including CT head w/o contrast and MRI did not show acute infarct. Neurologist Dr. Gerilyn Pilgrim evaluated patient and thought the semiology is more consistent and concerning with complex partial seizure. He recommends EEG for further evaluation of seizure, and patient is agreeable to testing. Daughter is updated.   Assessment & Plan:   Active Problems:   Gastro-esophageal reflux disease with esophagitis   Atherosclerotic heart disease of native coronary artery without angina pectoris   Chronic kidney disease, stage 3b (HCC)   Hypertensive heart disease without heart failure   Type 2 diabetes mellitus with diabetic nephropathy (HCC)    Mixed hyperlipidemia   Chest pain   TIA (transient ischemic attack)    L-sided weakness - in setting of CAD, diabetes with associated confusion. Cardiac and stroke workup was reassuring against acute infarcts. Differential includes TIA vs seizure. Neurology (Dr. Gerilyn Pilgrim) was consulted, evaluated patient and recommends proceeding with EEG to evaluate for complex partial seizure.  - CT, MRI brain 7/18 showed no evidence of acute infarcts. MRI brain shows moderate confluent leukoencephalopathy especially involving the posterior horn of the lateral ventricles as well as tiny increased signal involving the occipital lobe that appears to be a potential area of small remote infarct. - Bilateral carotid ultrasound showed less than 50% of occlusion bilaterally, patent antegrade flow bilaterally - Follow up EEG results - Continue home aspirin 81 mg - Continue Lovenox for anticoagulation - BP 120s/50s - Hold home metoprolol  - PT/OT evaluation recommends no follow up    Leukocytosis - WBC down to 7.5 this AM - Will monitor with daily CBC   Anemia - Hgb 11.7 slightly down from 12.6 - Likely anemia of chronic disease in setting of CKD - Will monitor with daily CBC  CAD s/p CABG June 2021 - following with outpatient cardiology - last visit 7/15, 2 days prior to this hospitalization - Echo 2021 showed LVEF 55-60% - Cardiology was consulted during this hospitalization initially given presenting symptoms of chest pain; evaluated patient this morning and recommends no further workup from cardiology standpoint.  -No further chest pain   CKD stage 3b - Creatinine 1.52 slightly up from baseline 1.20s - Will monitor with daily BMP   Dyslipidemia - 7/15 lipid profile showed total cholesterol 89, low  HDL 31, elevated tryglycerides 172 - Continue lipitor 40mg    Diabetes type 2 - BG 167, Hgb a1c 6.2 7/17 - Continue SSI 0-15 units TID w meals - Continue linagliptin 5mg  PO daily   DVT prophylaxis:  Lovenox Code Status: Full Family Communication: daughter was given update over phone Disposition Plan: home      Consultants:  Neurology 8/17) Cardiology ) - signed off   Procedures:  None   Antimicrobials:  None   Subjective: Patient was evaluated at bedside. No acute complaints over night. Patient did suggest that she was wondering if her confused state could be from the fact she just woke up. Confirms angina is of chronic nature since her CABG from 05/2020.  Discussed with patient neurologist's recommendations of obtaining EEG and patient was agreeable to having it done during this hospitalization.   Objective: Vitals:   07/02/21 0015 07/02/21 0219 07/02/21 0430 07/02/21 0616  BP: (!) 128/57 (!) 113/51 (!) 112/48 (!) 121/51  Pulse: 71 68 62 64  Resp: 18 17 17 18   Temp: 98.6 F (37 C) 97.9 F (36.6 C) 98.7 F (37.1 C) 98.2 F (36.8 C)  TempSrc:      SpO2: 95% 100% 99% 98%  Weight:      Height:        Intake/Output Summary (Last 24 hours) at 07/02/2021 0908 Last data filed at 07/02/2021 0544 Gross per 24 hour  Intake 720 ml  Output --  Net 720 ml   Filed Weights   06/30/21 1223 06/30/21 2035  Weight: 68 kg 70.8 kg    Examination:  General exam: Appears calm and comfortable  Respiratory system: Clear to auscultation. Respiratory effort normal. Cardiovascular system: S1 & S2 heard, RRR. No JVD, murmurs, rubs, gallops or clicks. No pedal edema. Gastrointestinal system: Abdomen is nondistended, soft and nontender. No organomegaly or masses felt. Normal bowel sounds heard. Central nervous system: Alert and awake. CN II-XII grossly intact.  Extremities: No edema Skin: No rashes, lesions or ulcers Psychiatry: Judgement and insight appear normal. Mood & affect appropriate.     Data Reviewed: I have personally reviewed following labs and imaging studies  CBC: Recent Labs  Lab 06/28/21 1040 06/30/21 1247 07/02/21 0328  WBC 7.6 11.3* 7.5   NEUTROABS 4.2  --   --   HGB 12.6 11.9* 11.7*  HCT 37.6 35.5* 35.4*  MCV 95.4 95.2 96.5  PLT 231 202 188   Basic Metabolic Panel: Recent Labs  Lab 06/28/21 1040 06/30/21 1247 07/02/21 0328  NA 138 136 137  K 4.8 4.6 4.5  CL 106 106 104  CO2 25 24 25   GLUCOSE 116* 168* 167*  BUN 24* 25* 28*  CREATININE 1.26* 1.47* 1.52*  CALCIUM 9.3 8.9 8.6*  MG  --   --  1.6*   GFR: Estimated Creatinine Clearance: 25.7 mL/min (A) (by C-G formula based on SCr of 1.52 mg/dL (H)). Liver Function Tests: Recent Labs  Lab 06/28/21 1040  AST 26  ALT 30  ALKPHOS 51  BILITOT 0.8  PROT 7.3  ALBUMIN 3.9   No results for input(s): LIPASE, AMYLASE in the last 168 hours. No results for input(s): AMMONIA in the last 168 hours. Coagulation Profile: No results for input(s): INR, PROTIME in the last 168 hours. Cardiac Enzymes: No results for input(s): CKTOTAL, CKMB, CKMBINDEX, TROPONINI in the last 168 hours. BNP (last 3 results) No results for input(s): PROBNP in the last 8760 hours. HbA1C: Recent Labs    06/30/21 1257  HGBA1C  6.2*   CBG: Recent Labs  Lab 06/30/21 2026 07/01/21 0733 07/01/21 1112 07/01/21 1615 07/01/21 2021  GLUCAP 85 136* 200* 166* 205*   Lipid Profile: No results for input(s): CHOL, HDL, LDLCALC, TRIG, CHOLHDL, LDLDIRECT in the last 72 hours. Thyroid Function Tests: No results for input(s): TSH, T4TOTAL, FREET4, T3FREE, THYROIDAB in the last 72 hours. Anemia Panel: No results for input(s): VITAMINB12, FOLATE, FERRITIN, TIBC, IRON, RETICCTPCT in the last 72 hours. Urine analysis:    Component Value Date/Time   COLORURINE YELLOW 02/03/2021 1848   APPEARANCEUR CLEAR 02/03/2021 1848   LABSPEC 1.012 02/03/2021 1848   PHURINE 5.0 02/03/2021 1848   GLUCOSEU >=500 (A) 02/03/2021 1848   HGBUR NEGATIVE 02/03/2021 1848   BILIRUBINUR NEGATIVE 02/03/2021 1848   KETONESUR NEGATIVE 02/03/2021 1848   PROTEINUR NEGATIVE 02/03/2021 1848   UROBILINOGEN 0.2 05/30/2010 1903    NITRITE NEGATIVE 02/03/2021 1848   LEUKOCYTESUR NEGATIVE 02/03/2021 1848   Sepsis Labs: @LABRCNTIP (procalcitonin:4,lacticidven:4)  ) Recent Results (from the past 240 hour(s))  SARS CORONAVIRUS 2 (TAT 6-24 HRS) Nasopharyngeal Nasopharyngeal Swab     Status: None   Collection Time: 06/30/21  6:00 PM   Specimen: Nasopharyngeal Swab  Result Value Ref Range Status   SARS Coronavirus 2 NEGATIVE NEGATIVE Final    Comment: (NOTE) SARS-CoV-2 target nucleic acids are NOT DETECTED.  The SARS-CoV-2 RNA is generally detectable in upper and lower respiratory specimens during the acute phase of infection. Negative results do not preclude SARS-CoV-2 infection, do not rule out co-infections with other pathogens, and should not be used as the sole basis for treatment or other patient management decisions. Negative results must be combined with clinical observations, patient history, and epidemiological information. The expected result is Negative.  Fact Sheet for Patients: HairSlick.nohttps://www.fda.gov/media/138098/download  Fact Sheet for Healthcare Providers: quierodirigir.comhttps://www.fda.gov/media/138095/download  This test is not yet approved or cleared by the [place] FDA and  has been authorized for detection and/or diagnosis of SARS-CoV-2 by FDA under an Emergency Use Authorization (EUA). This EUA will remain  in effect (meaning this test can be used) for the duration of the COVID-19 declaration under Se ction 564(b)(1) of the Act, 21 U.S.C. section 360bbb-3(b)(1), unless the authorization is terminated or revoked sooner.  Performed at [facility], [address] [place], [place] [address]          Radiology Studies: CT HEAD WO CONTRAST  Result Date: 07/01/2021 CLINICAL DATA:  TIA, no headaches, left-sided weakness EXAM: CT HEAD WITHOUT CONTRAST TECHNIQUE: Contiguous axial images were obtained from the base of the skull through the vertex without intravenous contrast. COMPARISON:   None. FINDINGS: Brain: No evidence of acute infarction, hemorrhage, extra-axial collection, ventriculomegaly, or mass effect. Generalized cerebral atrophy. Periventricular white matter low attenuation likely secondary to microangiopathy. Vascular: Cerebrovascular atherosclerotic calcifications are noted. Skull: Negative for fracture or focal lesion. Sinuses/Orbits: Visualized portions of the orbits are unremarkable. Visualized portions of the paranasal sinuses are unremarkable. Visualized portions of the mastoid air cells are unremarkable. Other: None. IMPRESSION: 1. No acute intracranial pathology. 2. Chronic microvascular disease and cerebral atrophy. Electronically Signed   By: Elige KoHetal  Patel   On: 07/01/2021 12:23   MR BRAIN WO CONTRAST  Result Date: 07/01/2021 CLINICAL DATA:  Transient ischemic attack (TIA). Additional provided: Chest pain, left-sided chest pain that radiates down left arm, history of MI. EXAM: MRI HEAD WITHOUT CONTRAST TECHNIQUE: Multiplanar, multiecho pulse sequences of the brain and surrounding structures were obtained without intravenous contrast. COMPARISON:  Head CT 07/01/2021.  carotid artery duplex 07/01/2021. FINDINGS: Brain: Mild generalized cerebral and cerebellar atrophy. Small chronic cortical infarct within the left occipital lobe (series 18, image 9). Mild-to-moderate multifocal T2/FLAIR hyperintensity within the cerebral white matter, nonspecific but compatible chronic small vessel ischemic disease. Tiny chronic infarct within the left cerebellar hemisphere (series 16, image 4). There is no acute infarct. No evidence of an intracranial mass. No chronic intracranial blood products. No extra-axial fluid collection. No midline shift. Vascular: Expected proximal arterial flow voids. Skull and upper cervical spine: No focal marrow lesion. Sinuses/Orbits: Visualized orbits show no acute finding. Mild mucosal thickening within the left maxillary sinus. Other: Trace fluid within the  left mastoid air cells. IMPRESSION: No evidence of acute intracranial abnormality. 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 Mild-to-moderate chronic small vessel ischemic changes within the cerebral white matter. Tiny chronic infarct within the left cerebellar hemisphere. Mild generalized parenchymal atrophy. Mild left maxillary sinus mucosal thickening. Trace fluid within the left mastoid air cells. Electronically Signed   By: Jackey Loge DO   On: 07/01/2021 16:14   US Carotid Bilateral (at [facility] and [facility] only)  Result Date: 07/01/2021 CLINICAL DATA:  80 year old female with history of TIA, left-sided weakness for 1 day. EXAM: BILATERAL CAROTID DUPLEX ULTRASOUND TECHNIQUE: Wallace Cullens scale imaging, color Doppler and duplex ultrasound were performed of bilateral carotid and vertebral arteries in the neck. COMPARISON:  None. FINDINGS: Criteria: Quantification of carotid stenosis is based on velocity parameters that correlate the residual internal carotid diameter with NASCET-based stenosis levels, using the diameter of the distal internal carotid lumen as the denominator for stenosis measurement. The following velocity measurements were obtained: RIGHT ICA: Peak systolic velocity 110 cm/sec, End diastolic velocity 22 cm/sec CCA: Peak systolic velocity 111 cm/sec SYSTOLIC ICA/CCA RATIO:  1.0 ECA: Peak systolic velocity 118 cm/sec LEFT ICA: Peak systolic velocity 124 cm/sec, End diastolic velocity 41 cm/sec CCA: 125 cm/sec SYSTOLIC ICA/CCA RATIO:  1.0 ECA: 144 cm/sec RIGHT CAROTID ARTERY: Mild multifocal atherosclerotic plaque formation about the carotid bulb. No significant tortuosity. Normal low resistance waveforms. RIGHT VERTEBRAL ARTERY:  Antegrade flow. LEFT CAROTID ARTERY: Moderate multifocal atherosclerotic plaque formation about the carotid bulb. No significant tortuosity. Normal low resistance waveforms. LEFT VERTEBRAL ARTERY:  Antegrade flow. Upper extremity non-invasive blood pressures:  Not obtained. IMPRESSION: 1. Right carotid artery system: Less than 50% stenosis secondary to mild multifocal atherosclerotic plaque formation. 2. Left carotid artery system: Less than 50% stenosis secondary to moderate multifocal atherosclerotic plaque formation about the carotid bulb. 3.  Vertebral artery system: Patent with antegrade flow bilaterally. Marliss Coots, MD [facility] Radiology Electronically Signed   By: Marliss Coots MD   On: 07/01/2021 15:38   DG Chest Portable 1 View  Result Date: 06/30/2021 CLINICAL DATA:  Left-sided chest pain, onset today. EXAM: PORTABLE CHEST 1 VIEW COMPARISON:  07/13/2020 FINDINGS: Post median sternotomy and CABG. Aortic atherosclerosis. The cardiomediastinal contours are normal. Mild left basilar scarring is stable. Pulmonary vasculature is normal. No consolidation, pleural effusion, or pneumothorax. No acute osseous abnormalities are seen. IMPRESSION: No acute chest findings. Aortic Atherosclerosis (ICD10-I70.0). Electronically Signed   By: Narda Rutherford M.D.   On: 06/30/2021 13:06      Scheduled Meds:  aspirin  324 mg Oral Once   aspirin EC  81 mg Oral Daily   atorvastatin  40 mg Oral QHS   enoxaparin (LOVENOX) injection  30 mg Subcutaneous Q24H   insulin aspart  0-15 Units Subcutaneous TID WC  linagliptin  5 mg Oral Daily   loratadine  10 mg Oral Daily      LOS: 1 day    Time spent: 30 minutes  Addendum: Agree with above. Pt is awaiting EEG results.  Elige Ko, Medical student [facility] Pager [phone]  If 7PM-7AM, please contact night-coverage [email or web address] Password [email or web address] 07/02/2021, 9:08 AM

## 2021-07-02 NOTE — Progress Notes (Signed)
Progress Note  Patient Name: [name] [name] Date of Encounter: 07/02/2021  [facility] Cardiologist: Nona Dell, MD   Subjective   She denies any chest pain today. Breathing is normal. She attempted to ambulate and did reasonably well. Mostly limited due to right hip pain. Feels her mind is clearer today.  Inpatient Medications    Scheduled Meds:  aspirin  324 mg Oral Once   aspirin EC  81 mg Oral Daily   atorvastatin  40 mg Oral QHS   enoxaparin (LOVENOX) injection  30 mg Subcutaneous Q24H   insulin aspart  0-15 Units Subcutaneous TID WC   linagliptin  5 mg Oral Daily   loratadine  10 mg Oral Daily   Continuous Infusions:  PRN Meds: acetaminophen, ALPRAZolam, ondansetron (ZOFRAN) IV   Vital Signs    Vitals:   07/02/21 0015 07/02/21 0219 07/02/21 0430 07/02/21 0616  BP: (!) 128/57 (!) 113/51 (!) 112/48 (!) 121/51  Pulse: 71 68 62 64  Resp: 18 17 17 18   Temp: 98.6 F (37 C) 97.9 F (36.6 C) 98.7 F (37.1 C) 98.2 F (36.8 C)  TempSrc:      SpO2: 95% 100% 99% 98%  Weight:      Height:        Intake/Output Summary (Last 24 hours) at 07/02/2021 1009 Last data filed at 07/02/2021 0544 Gross per 24 hour  Intake 720 ml  Output --  Net 720 ml   Last 3 Weights 06/30/2021 06/30/2021 06/28/2021  Weight (lbs) 156 lb 1.4 oz 150 lb 155 lb 3.2 oz  Weight (kg) 70.8 kg 68.04 kg 70.398 kg      Telemetry    Telemetry:  Telemetry was personally reviewed and demonstrates:  NSR  ECG    EKG:  The EKG was personally reviewed and demonstrates:  NSR with diffuse ST changes unchanged from prior tracings   Physical Exam   GEN: No acute distress.   Neck: No JVD Cardiac: RRR, no murmurs, rubs, or gallops.  Respiratory: Clear to auscultation bilaterally. GI: Soft, nontender, non-distended  MS: No edema; No deformity. Neuro:  Nonfocal  Psych: Normal affect   Labs    High Sensitivity Troponin:   Recent Labs  Lab 06/30/21 1247 06/30/21 1424 06/30/21 2155  07/01/21 0513  TROPONINIHS 7 8 9 9       Chemistry Recent Labs  Lab 06/28/21 1040 06/30/21 1247 07/02/21 0328  NA 138 136 137  K 4.8 4.6 4.5  CL 106 106 104  CO2 25 24 25   GLUCOSE 116* 168* 167*  BUN 24* 25* 28*  CREATININE 1.26* 1.47* 1.52*  CALCIUM 9.3 8.9 8.6*  PROT 7.3  --   --   ALBUMIN 3.9  --   --   AST 26  --   --   ALT 30  --   --   ALKPHOS 51  --   --   BILITOT 0.8  --   --   GFRNONAA 43* 36* 35*  ANIONGAP 7 6 8      Hematology Recent Labs  Lab 06/28/21 1040 06/30/21 1247 07/02/21 0328  WBC 7.6 11.3* 7.5  RBC 3.94 3.73* 3.67*  HGB 12.6 11.9* 11.7*  HCT 37.6 35.5* 35.4*  MCV 95.4 95.2 96.5  MCH 32.0 31.9 31.9  MCHC 33.5 33.5 33.1  RDW 12.0 12.0 12.1  PLT 231 202 188    BNPNo results for input(s): BNP, PROBNP in the last 168 hours.   DDimer No results for input(s): DDIMER in  the last 168 hours.   Radiology    CT HEAD WO CONTRAST  Result Date: 07/01/2021 CLINICAL DATA:  TIA, no headaches, left-sided weakness EXAM: CT HEAD WITHOUT CONTRAST TECHNIQUE: Contiguous axial images were obtained from the base of the skull through the vertex without intravenous contrast. COMPARISON:  None. FINDINGS: Brain: No evidence of acute infarction, hemorrhage, extra-axial collection, ventriculomegaly, or mass effect. Generalized cerebral atrophy. Periventricular white matter low attenuation likely secondary to microangiopathy. Vascular: Cerebrovascular atherosclerotic calcifications are noted. Skull: Negative for fracture or focal lesion. Sinuses/Orbits: Visualized portions of the orbits are unremarkable. Visualized portions of the paranasal sinuses are unremarkable. Visualized portions of the mastoid air cells are unremarkable. Other: None. IMPRESSION: 1. No acute intracranial pathology. 2. Chronic microvascular disease and cerebral atrophy. Electronically Signed   By: Elige Ko   On: 07/01/2021 12:23   MR BRAIN WO CONTRAST  Result Date: 07/01/2021 CLINICAL DATA:   Transient ischemic attack (TIA). Additional provided: Chest pain, left-sided chest pain that radiates down left arm, history of MI. EXAM: MRI HEAD WITHOUT CONTRAST TECHNIQUE: Multiplanar, multiecho pulse sequences of the brain and surrounding structures were obtained without intravenous contrast. COMPARISON:  Head CT 07/01/2021. carotid artery duplex 07/01/2021. FINDINGS: Brain: Mild generalized cerebral and cerebellar atrophy. Small chronic cortical infarct within the left occipital lobe (series 18, image 9). Mild-to-moderate multifocal T2/FLAIR hyperintensity within the cerebral white matter, nonspecific but compatible chronic small vessel ischemic disease. Tiny chronic infarct within the left cerebellar hemisphere (series 16, image 4). There is no acute infarct. No evidence of an intracranial mass. No chronic intracranial blood products. No extra-axial fluid collection. No midline shift. Vascular: Expected proximal arterial flow voids. Skull and upper cervical spine: No focal marrow lesion. Sinuses/Orbits: Visualized orbits show no acute finding. Mild mucosal thickening within the left maxillary sinus. Other: Trace fluid within the left mastoid air cells. IMPRESSION: No evidence of acute intracranial abnormality. 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 Mild-to-moderate chronic small vessel ischemic changes within the cerebral white matter. Tiny chronic infarct within the left cerebellar hemisphere. Mild generalized parenchymal atrophy. Mild left maxillary sinus mucosal thickening. Trace fluid within the left mastoid air cells. Electronically Signed   By: Jackey Loge DO   On: 07/01/2021 16:14   US Carotid Bilateral (at [facility] and [facility] only)  Result Date: 07/01/2021 CLINICAL DATA:  80 year old female with history of TIA, left-sided weakness for 1 day. EXAM: BILATERAL CAROTID DUPLEX ULTRASOUND TECHNIQUE: Wallace Cullens scale imaging, color Doppler and duplex ultrasound were performed of bilateral  carotid and vertebral arteries in the neck. COMPARISON:  None. FINDINGS: Criteria: Quantification of carotid stenosis is based on velocity parameters that correlate the residual internal carotid diameter with NASCET-based stenosis levels, using the diameter of the distal internal carotid lumen as the denominator for stenosis measurement. The following velocity measurements were obtained: RIGHT ICA: Peak systolic velocity 110 cm/sec, End diastolic velocity 22 cm/sec CCA: Peak systolic velocity 111 cm/sec SYSTOLIC ICA/CCA RATIO:  1.0 ECA: Peak systolic velocity 118 cm/sec LEFT ICA: Peak systolic velocity 124 cm/sec, End diastolic velocity 41 cm/sec CCA: 125 cm/sec SYSTOLIC ICA/CCA RATIO:  1.0 ECA: 144 cm/sec RIGHT CAROTID ARTERY: Mild multifocal atherosclerotic plaque formation about the carotid bulb. No significant tortuosity. Normal low resistance waveforms. RIGHT VERTEBRAL ARTERY:  Antegrade flow. LEFT CAROTID ARTERY: Moderate multifocal atherosclerotic plaque formation about the carotid bulb. No significant tortuosity. Normal low resistance waveforms. LEFT VERTEBRAL ARTERY:  Antegrade flow. Upper extremity non-invasive blood pressures: Not obtained. IMPRESSION: 1. Right carotid artery  system: Less than 50% stenosis secondary to mild multifocal atherosclerotic plaque formation. 2. Left carotid artery system: Less than 50% stenosis secondary to moderate multifocal atherosclerotic plaque formation about the carotid bulb. 3.  Vertebral artery system: Patent with antegrade flow bilaterally. Marliss Coots, MD [facility] Electronically Signed   By: Marliss Coots MD   On: 07/01/2021 15:38   DG Chest Portable 1 View  Result Date: 06/30/2021 CLINICAL DATA:  Left-sided chest pain, onset today. EXAM: PORTABLE CHEST 1 VIEW COMPARISON:  07/13/2020 FINDINGS: Post median sternotomy and CABG. Aortic atherosclerosis. The cardiomediastinal contours are normal. Mild left  basilar scarring is stable. Pulmonary vasculature is normal. No consolidation, pleural effusion, or pneumothorax. No acute osseous abnormalities are seen. IMPRESSION: No acute chest findings. Aortic Atherosclerosis (ICD10-I70.0). Electronically Signed   By: Narda Rutherford M.D.   On: 06/30/2021 13:06    Cardiac Studies   Cardiac catheterization 05/21/2020: Prox LAD to Mid LAD lesion is 95% stenosed. Ost Cx to Prox Cx lesion is 100% stenosed. Prox RCA lesion is 90% stenosed. Mid RCA lesion is 100% stenosed. LV end diastolic pressure is normal.   1. Critical 3 vessel occlusive CAD.    - 95% proximal to mid LAD    -100% proximal LCx. OM1 and OM2 fill by left to left collaterals    - 100% mid RCA. Distal vessel fills by right to right and left to right collaterals 2. Normal LVEDP   Echocardiogram 05/18/2020:  1. Left ventricular ejection fraction, by estimation, is 55 to 60%. The  left ventricle has normal function. The left ventricle has no regional  wall motion abnormalities. There is mild left ventricular hypertrophy.  Left ventricular diastolic parameters  are indeterminate.   2. Right ventricular systolic function is normal. The right ventricular  size is normal.   3. Left atrial size was mildly dilated.   4. The mitral valve is normal in structure. Trivial mitral valve  regurgitation. No evidence of mitral stenosis.   5. The aortic valve has an indeterminant number of cusps. Aortic valve  regurgitation is not visualized. No aortic stenosis is present.   6. The inferior vena cava is normal in size with greater than 50%  respiratory variability, suggesting right atrial pressure of 3 mmHg.    Patient Profile     80 y.o. female with a hx of CAD s/p 3-vessel CABG in June 2021, COPD, HTN, HLD, DM2, GERD, depression/anxiety who presented for left sided weakness and confusion.  Assessment & Plan    Chest discomfort The patient presented with vague symptoms. At one point there was  concern for chest pain however her presentation is not consistent with an ACS. Unchanged ECG and troponins were negative x 4. She is currently undergoing a neurological work-up (? complex partial seizures).  -Continue ASA -Continue Lipitor -Continue metoprolol  No current indication for ischemic work-up. We will continue to follow the patient peripherally. If there are any acute issues please call us for further input. Will sign off on the patient.   For questions or updates, please contact [facility] Please consult [email or web address] for contact info under        Signed, Lonie Peak, MD  07/02/2021, 10:09 AM

## 2021-07-02 NOTE — Plan of Care (Signed)
  Problem: Acute Rehab PT Goals(only PT should resolve) Goal: Patient Will Transfer Sit To/From Stand Outcome: Progressing Flowsheets (Taken 07/02/2021 1220) Patient will transfer sit to/from stand: with modified independence Goal: Pt Will Ambulate Outcome: Progressing Flowsheets (Taken 07/02/2021 1220) Pt will Ambulate:  > 125 feet  with modified independence Goal: Pt/caregiver will Perform Home Exercise Program Outcome: Progressing Flowsheets (Taken 07/02/2021 1220) Pt/caregiver will Perform Home Exercise Program:  For increased ROM  For increased strengthening  For improved balance  Independently   Tori Aliesha Dolata PT, DPT 07/02/21, 12:21 PM

## 2021-07-02 NOTE — Discharge Summary (Addendum)
Physician Discharge Summary  [name] [name] WVP:[id number] DOB: December 17, 1940 DOA: 06/30/2021  PCP: [name], NP  Admit date: 06/30/2021  Discharge date: 07/03/2021  Admitted From: home  Disposition:  home  Recommendations for Outpatient Follow-up:  Follow up with PCP in 1-2 weeks Follow up with neurology Dr. Merlene Laughter at [facility] and Neurology in 1-2 weeks Please continue to monitor for signs and symptoms of seizure  Please continue to monitor for signs and symptoms of ACS Please obtain BMP/CBC in one week Please resume metformin and tradjenta for diabetes, aspirin, lipitor, nitroglycerin for angina/cardiovascular disease, and metoprolol for hypertension.   Home Health: none   Equipment/Devices: none  Discharge Condition:Stable  CODE STATUS: Full  Diet recommendation: Heart Healthy  Brief/Interim Summary:  [name] [name] is a 80 year-old female with a history of diabetes, hypertension, CAD s/p 3-vessel CABG in June 2021, COPD, depression presented for left sided weakness and confusion. Patient woke up 7/17 AM with strange feeling in her chest, strange sensation and weakness down her left side. States she fell "lost", and her daughter took her to [facility] ED. In the ED, the presentation was documented as "severe chest pain ,located in the left chest radiating up into left shoulder and down arm." Given her medical history of CAD, chest pain workup was performed, showing negative troponin and stable EKG.   7/18 - Cardiology was consulted and evaluated patient, who recommends no further cardiology workup. However, in review of history of left-sided weakness, confusion and physical exam showing slight drooping on left corner of mouth, stroke workup was initiated. Patient does show 5/5 strength and intact mentation. CT brain without contrast was ordered, pending results; if negative will obtain MRI. Patient was given PRN xanax given self-reported claustrophobia/anxiety   7/19 -  All TIA/stroke workup including CT head w/o contrast and MRI did not show acute infarct. Neurologist Dr. Merlene Laughter evaluated patient and thought the semiology is more consistent and concerning with complex partial seizure. He recommends EEG for further evaluation of seizure, and patient is agreeable to testing. Daughter Earlie Lou is updated.   7/20 - EEG reported negative for epileptiform activity.   EEG was obtained, and patient was instructed to follow up with [facility] Neurology Patient regarding EEG results. Patient is medically stable with resolution of chest pain/ weakness and to be discharged home to follow up with outpatient PCP and neurology as instructed.  L-sided weakness - in setting of CAD, diabetes with associated confusion. Cardiac and stroke workup was reassuring against acute infarcts. Differential includes TIA vs seizure. Neurology (Dr. Merlene Laughter) was consulted, evaluated patient and recommends proceeding with EEG to evaluate for complex partial seizure. - CT, MRI brain 7/18 showed no evidence of acute infarcts. MRI brain shows moderate confluent leukoencephalopathy especially involving the posterior horn of the lateral ventricles as well as tiny increased signal involving the occipital lobe that appears to be a potential area of small remote infarct. - Bilateral carotid ultrasound showed less than 50% of occlusion bilaterally, patent antegrade flow bilaterally - Follow up EEG results --- negative for epileptiform activity - Continue home aspirin 81 mg - Continue Lovenox for anticoagulation - BP 120s/50s - Hold home metoprolol due to very soft blood pressures, follow up with PCP for recheck - PT/OT evaluation recommends no follow up    Leukocytosis - WBC down to 7.5 this AM   Anemia - Hgb 11.7 slightly down from 12.6 - Likely anemia of chronic disease in setting of CKD - outpatient follow  up  CAD s/p CABG June 2021 - following with outpatient cardiology - last visit 7/15, 2  days prior to this hospitalization - Echo 2021 showed LVEF 55-60% - Cardiology was consulted during this hospitalization initially given presenting symptoms of chest pain; evaluated patient this morning and recommends no further workup from cardiology standpoint.  -No further chest pain   CKD stage 3b - Creatinine 1.52 slightly up from baseline 1.20s - outpatient follow up for surveillance   Dyslipidemia - 7/15 lipid profile showed total cholesterol 89, low HDL 31, elevated tryglycerides 172 - Continue lipitor 47m   Diabetes type 2 - BG 167, Hgb a1c 6.2 7/17 - resume home treatment regimen at DC    DVT prophylaxis: Lovenox Code Status: Full Family Communication: daughter was given update over phone Disposition Plan: home   Discharge Diagnoses:  Active Problems:   Gastro-esophageal reflux disease with esophagitis   Atherosclerotic heart disease of native coronary artery without angina pectoris   Chronic kidney disease, stage 3b (HCC)   Hypertensive heart disease without heart failure   Type 2 diabetes mellitus with diabetic nephropathy (HCC)   Mixed hyperlipidemia   Chest pain   TIA (transient ischemic attack)  Discharge Instructions - Please follow up with PCP and specialists as instructed.   Allergies as of 07/03/2021       Reactions   Contrast Media [iodinated Diagnostic Agents] Other (See Comments)   Unknown reaction. Due to kidney issues. Do not use   Codeine Rash   Morphine And Related Other (See Comments)   Unknown Reaction   Other    Pt has multiple allergies per pt but unsure of all.    Penicillins Other (See Comments)   Unknown reaction        Medication List     STOP taking these medications    metoprolol tartrate 25 MG tablet Commonly known as: LOPRESSOR       TAKE these medications    Accu-Chek Guide test strip Generic drug: glucose blood Use as instructed to monitor glucose twice daily   Accu-Chek Softclix Lancets lancets Use as  instructed   aspirin EC 81 MG tablet Take 1 tablet (81 mg total) by mouth daily. Swallow whole.   atorvastatin 40 MG tablet Commonly known as: LIPITOR TAKE 1 TABLET BY MOUTH AT BEDTIME.   blood glucose meter kit and supplies Dispense based on patient and insurance preference. Use up to four times daily as directed. (FOR ICD-10 E10.9, E11.9).   cetirizine 10 MG tablet Commonly known as: ZYRTEC Take 10 mg by mouth daily as needed (allergies).   fish oil-omega-3 fatty acids 1000 MG capsule Take 1 g by mouth 2 (two) times daily.   glipiZIDE 5 MG 24 hr tablet Commonly known as: GLUCOTROL XL Take 1 tablet (5 mg total) by mouth daily with breakfast.   HAIR SKIN & NAILS GUMMIES PO Take 2 each by mouth every morning.   linagliptin 5 MG Tabs tablet Commonly known as: Tradjenta Take 1 tablet (5 mg total) by mouth daily.   metFORMIN 500 MG tablet Commonly known as: GLUCOPHAGE Take 1 tablet (500 mg total) by mouth 2 (two) times daily with a meal.   multivitamin with minerals Tabs tablet Take 1 tablet by mouth daily.   nitroGLYCERIN 0.4 MG SL tablet Commonly known as: NITROSTAT Place 1 tablet (0.4 mg total) under the tongue every 5 (five) minutes x 3 doses as needed for chest pain (if no relief after 3rd dose, proceed to the ED  for an evaluation or call 911).   polyethylene glycol 17 g packet Commonly known as: MIRALAX / GLYCOLAX Take 17 g by mouth daily as needed for mild constipation or moderate constipation.        Follow-up Information     [name], NP. Schedule an appointment as soon as possible for a visit in 1 week(s).   Specialty: Nurse Practitioner Contact information: [address]  [address] [place] [address] [phone]         Satira Sark, MD .   Specialty: Cardiology Contact information: [place] [address] [phone]         Phillips Odor, MD. Schedule an appointment as soon as possible for a  visit in 2 week(s).   Specialty: Neurology Why: Hospital Follow Up Contact information: [address] [place] [address] [phone]                Allergies  Allergen Reactions   Contrast Media [Iodinated Diagnostic Agents] Other (See Comments)    Unknown reaction. Due to kidney issues. Do not use   Codeine Rash   Morphine And Related Other (See Comments)    Unknown Reaction   Other     Pt has multiple allergies per pt but unsure of all.    Penicillins Other (See Comments)    Unknown reaction    Consultations: Neurology (Dr. Merlene Laughter) Cardiology (Dr. [name])   Procedures/Studies: CT HEAD WO CONTRAST  Result Date: 07/01/2021 CLINICAL DATA:  TIA, no headaches, left-sided weakness EXAM: CT HEAD WITHOUT CONTRAST TECHNIQUE: Contiguous axial images were obtained from the base of the skull through the vertex without intravenous contrast. COMPARISON:  None. FINDINGS: Brain: No evidence of acute infarction, hemorrhage, extra-axial collection, ventriculomegaly, or mass effect. Generalized cerebral atrophy. Periventricular white matter low attenuation likely secondary to microangiopathy. Vascular: Cerebrovascular atherosclerotic calcifications are noted. Skull: Negative for fracture or focal lesion. Sinuses/Orbits: Visualized portions of the orbits are unremarkable. Visualized portions of the paranasal sinuses are unremarkable. Visualized portions of the mastoid air cells are unremarkable. Other: None. IMPRESSION: 1. No acute intracranial pathology. 2. Chronic microvascular disease and cerebral atrophy. Electronically Signed   By: Kathreen Devoid   On: 07/01/2021 12:23   MR BRAIN WO CONTRAST  Result Date: 07/01/2021 CLINICAL DATA:  Transient ischemic attack (TIA). Additional provided: Chest pain, left-sided chest pain that radiates down left arm, history of MI. EXAM: MRI HEAD WITHOUT CONTRAST TECHNIQUE: Multiplanar, multiecho pulse sequences of the brain and surrounding structures were  obtained without intravenous contrast. COMPARISON:  Head CT 07/01/2021. carotid artery duplex 07/01/2021. FINDINGS: Brain: Mild generalized cerebral and cerebellar atrophy. Small chronic cortical infarct within the left occipital lobe (series 18, image 9). Mild-to-moderate multifocal T2/FLAIR hyperintensity within the cerebral white matter, nonspecific but compatible chronic small vessel ischemic disease. Tiny chronic infarct within the left cerebellar hemisphere (series 16, image 4). There is no acute infarct. No evidence of an intracranial mass. No chronic intracranial blood products. No extra-axial fluid collection. No midline shift. Vascular: Expected proximal arterial flow voids. Skull and upper cervical spine: No focal marrow lesion. Sinuses/Orbits: Visualized orbits show no acute finding. Mild mucosal thickening within the left maxillary sinus. Other: Trace fluid within the left mastoid air cells. IMPRESSION: No evidence of acute intracranial abnormality. 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 Mild-to-moderate chronic small vessel ischemic changes within the cerebral white matter. Tiny chronic infarct within the left cerebellar hemisphere. Mild generalized parenchymal atrophy. Mild  left maxillary sinus mucosal thickening. Trace fluid within the left mastoid air cells. Electronically Signed   By: Kellie Simmering DO   On: 07/01/2021 16:14   US Carotid Bilateral (at [facility] and [facility] only)  Result Date: 07/01/2021 CLINICAL DATA:  80 year old female with history of TIA, left-sided weakness for 1 day. EXAM: BILATERAL CAROTID DUPLEX ULTRASOUND TECHNIQUE: Pearline Cables scale imaging, color Doppler and duplex ultrasound were performed of bilateral carotid and vertebral arteries in the neck. COMPARISON:  None. FINDINGS: Criteria: Quantification of carotid stenosis is based on velocity parameters that correlate the residual internal carotid diameter with NASCET-based stenosis levels, using the diameter of the  distal internal carotid lumen as the denominator for stenosis measurement. The following velocity measurements were obtained: RIGHT ICA: Peak systolic velocity 379 cm/sec, End diastolic velocity 22 cm/sec CCA: Peak systolic velocity 024 cm/sec SYSTOLIC ICA/CCA RATIO:  1.0 ECA: Peak systolic velocity 097 cm/sec LEFT ICA: Peak systolic velocity 353 cm/sec, End diastolic velocity 41 cm/sec CCA: 299 cm/sec SYSTOLIC ICA/CCA RATIO:  1.0 ECA: 144 cm/sec RIGHT CAROTID ARTERY: Mild multifocal atherosclerotic plaque formation about the carotid bulb. No significant tortuosity. Normal low resistance waveforms. RIGHT VERTEBRAL ARTERY:  Antegrade flow. LEFT CAROTID ARTERY: Moderate multifocal atherosclerotic plaque formation about the carotid bulb. No significant tortuosity. Normal low resistance waveforms. LEFT VERTEBRAL ARTERY:  Antegrade flow. Upper extremity non-invasive blood pressures: Not obtained. IMPRESSION: 1. Right carotid artery system: Less than 50% stenosis secondary to mild multifocal atherosclerotic plaque formation. 2. Left carotid artery system: Less than 50% stenosis secondary to moderate multifocal atherosclerotic plaque formation about the carotid bulb. 3.  Vertebral artery system: Patent with antegrade flow bilaterally. Ruthann Cancer, MD [facility] Electronically Signed   By: Ruthann Cancer MD   On: 07/01/2021 15:38   DG Chest Portable 1 View  Result Date: 06/30/2021 CLINICAL DATA:  Left-sided chest pain, onset today. EXAM: PORTABLE CHEST 1 VIEW COMPARISON:  07/13/2020 FINDINGS: Post median sternotomy and CABG. Aortic atherosclerosis. The cardiomediastinal contours are normal. Mild left basilar scarring is stable. Pulmonary vasculature is normal. No consolidation, pleural effusion, or pneumothorax. No acute osseous abnormalities are seen. IMPRESSION: No acute chest findings. Aortic Atherosclerosis (ICD10-I70.0). Electronically Signed   By: Keith Rake M.D.   On: 06/30/2021 13:06   EEG adult  Result Date: 07/03/2021 Lora Havens, MD     07/03/2021  8:56 AM Patient Name: ZARA WENDT MRN: [id number] Epilepsy Attending: Lora Havens Referring Physician/Provider: Dr Phillips Odor Date: 07/02/2021 Duration: 23.24 mins Patient history:  80 year old female who presents with confusion on awakening yesterday.  EEG to evaluate for seizures. Level of alertness: Awake AEDs during EEG study: None Technical aspects: This EEG study was done with scalp electrodes positioned according to the 10-20 International system of electrode placement. Electrical activity was acquired at a sampling rate of 500Hz and reviewed with a high frequency filter of 70Hz and a low frequency filter of 1Hz. EEG data were recorded continuously and digitally stored. Description: The posterior dominant rhythm consists of 9 Hz activity of moderate voltage (25-35 uV) seen predominantly in posterior head regions, symmetric and reactive to eye opening and eye closing. Hyperventilation and photic stimulation were not performed.   IMPRESSION: This study is within normal limits. No seizures or epileptiform discharges were seen throughout the recording. Lora Havens   ECHOCARDIOGRAM COMPLETE  Result Date: 07/02/2021    ECHOCARDIOGRAM REPORT   Patient Name:   SHAYLE DONAHOO Date of Exam: 07/02/2021  Medical Rec #:  [id number]      Height:       59.0 in Accession #:    [id number]     Weight:       156.1 lb Date of Birth:  Feb 20, 1941     BSA:          1.660 m Patient Age:    44 years       BP:           108/47 mmHg Patient Gender: F              HR:           61 bpm. Exam Location:  [facility] Procedure: 2D Echo, Cardiac Doppler and Color Doppler Indications:    TIA  History:        Patient has prior history of Echocardiogram examinations, most                 recent 05/22/2020. CAD, Prior CABG, TIA and COPD; Risk                 Factors:Hypertension, Diabetes and obesity.  Sonographer:     Dustin Flock RDCS Referring Phys: [id number] Royanne Foots [facility]  Sonographer Comments: Patient is morbidly obese. Image acquisition challenging due to patient body habitus. IMPRESSIONS  1. Left ventricular ejection fraction, by estimation, is 55 to 60%. The left ventricle has normal function. The left ventricle has no regional wall motion abnormalities. There is mild left ventricular hypertrophy. Left ventricular diastolic parameters were normal.  2. Right ventricular systolic function is normal. The right ventricular size is normal. There is normal pulmonary artery systolic pressure. The estimated right ventricular systolic pressure is 41.6 mmHg.  3. The mitral valve is grossly normal. Trivial mitral valve regurgitation.  4. The aortic valve is tricuspid. There is mild calcification of the aortic valve. Aortic valve regurgitation is not visualized. Mild aortic valve sclerosis is present, with no evidence of aortic valve stenosis.  5. The inferior vena cava is normal in size with greater than 50% respiratory variability, suggesting right atrial pressure of 3 mmHg. FINDINGS  Left Ventricle: Left ventricular ejection fraction, by estimation, is 55 to 60%. The left ventricle has normal function. The left ventricle has no regional wall motion abnormalities. The left ventricular internal cavity size was normal in size. There is  mild left ventricular hypertrophy. Left ventricular diastolic parameters were normal. Right Ventricle: The right ventricular size is normal. No increase in right ventricular wall thickness. Right ventricular systolic function is normal. There is normal pulmonary artery systolic pressure. The tricuspid regurgitant velocity is 1.86 m/s, and  with an assumed right atrial pressure of 3 mmHg, the estimated right ventricular systolic pressure is 60.6 mmHg. Left Atrium: Left atrial size was normal in size. Right Atrium: Right atrial size was normal in size. Pericardium: There is no evidence of pericardial  effusion. Mitral Valve: The mitral valve is grossly normal. Mild mitral annular calcification. Trivial mitral valve regurgitation. Tricuspid Valve: The tricuspid valve is grossly normal. Tricuspid valve regurgitation is trivial. Aortic Valve: The aortic valve is tricuspid. There is mild calcification of the aortic valve. There is mild aortic valve annular calcification. Aortic valve regurgitation is not visualized. Mild aortic valve sclerosis is present, with no evidence of aortic valve stenosis. Pulmonic Valve: The pulmonic valve was grossly normal. Pulmonic valve regurgitation is trivial. Aorta: The aortic root is normal in size and structure. Venous: The inferior vena cava is normal in size with  greater than 50% respiratory variability, suggesting right atrial pressure of 3 mmHg. IAS/Shunts: No atrial level shunt detected by color flow Doppler.  LEFT VENTRICLE PLAX 2D LVIDd:         3.60 cm  Diastology LVIDs:         2.69 cm  LV e' medial:    7.53 cm/s LV PW:         1.32 cm  LV E/e' medial:  6.4 LV IVS:        1.35 cm  LV e' lateral:   8.24 cm/s LVOT diam:     2.10 cm  LV E/e' lateral: 5.8 LV SV:         49 LV SV Index:   29 LVOT Area:     3.46 cm  RIGHT VENTRICLE RV Basal diam:  2.66 cm TAPSE (M-mode): 2.0 cm LEFT ATRIUM           Index       RIGHT ATRIUM           Index LA diam:      3.10 cm 1.87 cm/m  RA Area:     12.20 cm LA Vol (A2C): 21.5 ml 12.95 ml/m RA Volume:   29.20 ml  17.59 ml/m LA Vol (A4C): 45.4 ml 27.35 ml/m  AORTIC VALVE LVOT Vmax:   69.50 cm/s LVOT Vmean:  42.900 cm/s LVOT VTI:    0.141 m  AORTA Ao Root diam: 2.90 cm MITRAL VALVE               TRICUSPID VALVE MV Area (PHT): 2.91 cm    TR Peak grad:   13.8 mmHg MV Decel Time: 261 msec    TR Vmax:        186.00 cm/s MV E velocity: 48.00 cm/s MV A velocity: 50.00 cm/s  SHUNTS MV E/A ratio:  0.96        Systemic VTI:  0.14 m                            Systemic Diam: 2.10 cm Rozann Lesches MD Electronically signed by Rozann Lesches MD  Signature Date/Time: 07/02/2021/1:24:20 PM    Final      Discharge Exam: Vitals:   07/03/21 0347 07/03/21 0747  BP: 131/60 (!) 104/45  Pulse: 62 (!) 58  Resp: 19 18  Temp: 98.3 F (36.8 C) 97.8 F (36.6 C)  SpO2: 96% 96%   Vitals:   07/02/21 2016 07/02/21 2128 07/03/21 0347 07/03/21 0747  BP:  (!) 135/49 131/60 (!) 104/45  Pulse:  61 62 (!) 58  Resp:  _0 Temp:  98.1 F (36.7 C) 98.3 F (36.8 C) 97.8 F (36.6 C)  TempSrc:  Oral Oral Oral  SpO2: 94% 96% 96% 96%  Weight:      Height:        General: Pt is alert, awake, not in acute distress Cardiovascular: RRR, S1/S2 +, no rubs, no gallops Respiratory: CTA bilaterally, no wheezing, no rhonchi Abdominal: Soft, NT, ND, bowel sounds + Extremities: no edema, no cyanosis  The results of significant diagnostics from this hospitalization (including imaging, microbiology, ancillary and laboratory) are listed below for reference.     Microbiology: Recent Results (from the past 240 hour(s))  SARS CORONAVIRUS 2 (TAT 6-24 HRS) Nasopharyngeal Nasopharyngeal Swab     Status: None   Collection Time: 06/30/21  6:00 PM   Specimen: Nasopharyngeal Swab  Result Value Ref  Range Status   SARS Coronavirus 2 NEGATIVE NEGATIVE Final    Comment: (NOTE) SARS-CoV-2 target nucleic acids are NOT DETECTED.  The SARS-CoV-2 RNA is generally detectable in upper and lower respiratory specimens during the acute phase of infection. Negative results do not preclude SARS-CoV-2 infection, do not rule out co-infections with other pathogens, and should not be used as the sole basis for treatment or other patient management decisions. Negative results must be combined with clinical observations, patient history, and epidemiological information. The expected result is Negative.  Fact Sheet for Patients: SugarRoll.be  Fact Sheet for Healthcare Providers: https://www.woods-mathews.com/  This test is not yet  approved or cleared by the [place] FDA and  has been authorized for detection and/or diagnosis of SARS-CoV-2 by FDA under an Emergency Use Authorization (EUA). This EUA will remain  in effect (meaning this test can be used) for the duration of the COVID-19 declaration under Se ction 564(b)(1) of the Act, 21 U.S.C. section 360bbb-3(b)(1), unless the authorization is terminated or revoked sooner.  Performed at [facility], [place] [address], [place], [place] [address]      Labs: BNP (last 3 results) No results for input(s): BNP in the last 8760 hours. Basic Metabolic Panel: Recent Labs  Lab 06/28/21 1040 06/30/21 1247 07/02/21 0328  NA 138 136 137  K 4.8 4.6 4.5  CL 106 106 104  CO2 _0 GLUCOSE 116* 168* 167*  BUN 24* 25* 28*  CREATININE 1.26* 1.47* 1.52*  CALCIUM 9.3 8.9 8.6*  MG  --   --  1.6*   Liver Function Tests: Recent Labs  Lab 06/28/21 1040  AST 26  ALT 30  ALKPHOS 51  BILITOT 0.8  PROT 7.3  ALBUMIN 3.9   No results for input(s): LIPASE, AMYLASE in the last 168 hours. No results for input(s): AMMONIA in the last 168 hours. CBC: Recent Labs  Lab 06/28/21 1040 06/30/21 1247 07/02/21 0328  WBC 7.6 11.3* 7.5  NEUTROABS 4.2  --   --   HGB 12.6 11.9* 11.7*  HCT 37.6 35.5* 35.4*  MCV 95.4 95.2 96.5  PLT 231 202 188   Cardiac Enzymes: No results for input(s): CKTOTAL, CKMB, CKMBINDEX, TROPONINI in the last 168 hours. BNP: Invalid input(s): POCBNP CBG: Recent Labs  Lab 07/01/21 2021 07/02/21 1140 07/02/21 1630 07/02/21 2125 07/03/21 0712  GLUCAP 205* 186* 170* 163* 133*   D-Dimer No results for input(s): DDIMER in the last 72 hours. Hgb A1c Recent Labs    06/30/21 1257  HGBA1C 6.2*   Lipid Profile No results for input(s): CHOL, HDL, LDLCALC, TRIG, CHOLHDL, LDLDIRECT in the last 72 hours. Thyroid function studies No results for input(s): TSH, T4TOTAL, T3FREE, THYROIDAB in the last 72 hours.  Invalid input(s):  FREET3 Anemia work up No results for input(s): VITAMINB12, FOLATE, FERRITIN, TIBC, IRON, RETICCTPCT in the last 72 hours. Urinalysis    Component Value Date/Time   COLORURINE YELLOW 02/03/2021 1848   APPEARANCEUR CLEAR 02/03/2021 1848   LABSPEC 1.012 02/03/2021 1848   PHURINE 5.0 02/03/2021 1848   GLUCOSEU >=500 (A) 02/03/2021 1848   HGBUR NEGATIVE 02/03/2021 1848   BILIRUBINUR NEGATIVE 02/03/2021 1848   KETONESUR NEGATIVE 02/03/2021 1848   PROTEINUR NEGATIVE 02/03/2021 1848   UROBILINOGEN 0.2 05/30/2010 1903   NITRITE NEGATIVE 02/03/2021 1848   LEUKOCYTESUR NEGATIVE 02/03/2021 1848   Sepsis Labs Invalid input(s): PROCALCITONIN,  WBC,  LACTICIDVEN Microbiology Recent Results (from the past 240 hour(s))  SARS CORONAVIRUS 2 (TAT 6-24  HRS) Nasopharyngeal Nasopharyngeal Swab     Status: None   Collection Time: 06/30/21  6:00 PM   Specimen: Nasopharyngeal Swab  Result Value Ref Range Status   SARS Coronavirus 2 NEGATIVE NEGATIVE Final    Comment: (NOTE) SARS-CoV-2 target nucleic acids are NOT DETECTED.  The SARS-CoV-2 RNA is generally detectable in upper and lower respiratory specimens during the acute phase of infection. Negative results do not preclude SARS-CoV-2 infection, do not rule out co-infections with other pathogens, and should not be used as the sole basis for treatment or other patient management decisions. Negative results must be combined with clinical observations, patient history, and epidemiological information. The expected result is Negative.  Fact Sheet for Patients: SugarRoll.be  Fact Sheet for Healthcare Providers: https://www.woods-mathews.com/  This test is not yet approved or cleared by the [place] FDA and  has been authorized for detection and/or diagnosis of SARS-CoV-2 by FDA under an Emergency Use Authorization (EUA). This EUA will remain  in effect (meaning this test can be used) for the duration of  the COVID-19 declaration under Se ction 564(b)(1) of the Act, 21 U.S.C. section 360bbb-3(b)(1), unless the authorization is terminated or revoked sooner.  Performed at [facility], [place] [address] [place], [place] [address]     Time coordinating discharge: 35 minutes  SIGNED:   Irwin Brakeman, MD [facility] 07/03/2021, 10:32 AM  If 7PM-7AM, please contact night-coverage [email or web address]    Patient seen and examined with Fredrik Rigger, Medical student. In addition to supervising the encounter, I played a key role in the decision making process as well as reviewed key findings.

## 2021-07-02 NOTE — Progress Notes (Signed)
EEG Completed; Results Pending  

## 2021-07-03 DIAGNOSIS — E782 Mixed hyperlipidemia: Secondary | ICD-10-CM

## 2021-07-03 DIAGNOSIS — R404 Transient alteration of awareness: Secondary | ICD-10-CM

## 2021-07-03 DIAGNOSIS — R079 Chest pain, unspecified: Secondary | ICD-10-CM

## 2021-07-03 DIAGNOSIS — I119 Hypertensive heart disease without heart failure: Secondary | ICD-10-CM

## 2021-07-03 DIAGNOSIS — R410 Disorientation, unspecified: Secondary | ICD-10-CM

## 2021-07-03 DIAGNOSIS — N1832 Chronic kidney disease, stage 3b: Secondary | ICD-10-CM

## 2021-07-03 DIAGNOSIS — E1121 Type 2 diabetes mellitus with diabetic nephropathy: Secondary | ICD-10-CM

## 2021-07-03 LAB — GLUCOSE, CAPILLARY
Glucose-Capillary: 144 mg/dL — ABNORMAL HIGH (ref 70–99)
Glucose-Capillary: 133 mg/dL — ABNORMAL HIGH (ref 70–99)
Glucose-Capillary: 254 mg/dL — ABNORMAL HIGH (ref 70–99)
Glucose-Capillary: 180 mg/dL — ABNORMAL HIGH (ref 70–99)

## 2021-07-03 MED ORDER — METOPROLOL TARTRATE 25 MG PO TABS: 12.5000 mg | ORAL_TABLET | Freq: Two times a day (BID) | ORAL | Status: DC

## 2021-07-03 MED ORDER — MAGNESIUM SULFATE 2 GM/50ML IV SOLN
2.0000 g | Freq: Once | INTRAVENOUS | Status: AC
  Administered 2021-07-03: 2 g via INTRAVENOUS
  Filled 2021-07-03: qty 50

## 2021-07-03 NOTE — Discharge Instructions (Addendum)
IMPORTANT INFORMATION: PAY CLOSE ATTENTION   PHYSICIAN DISCHARGE INSTRUCTIONS  Follow with Primary care provider  Gray, Joseph M, NP  and other consultants as instructed by your Hospitalist Physician  SEEK MEDICAL CARE OR RETURN TO EMERGENCY ROOM IF SYMPTOMS COME BACK, WORSEN OR NEW PROBLEM DEVELOPS   Please note: You were cared for by a hospitalist during your hospital stay. Every effort will be made to forward records to your primary care provider.  You can request that your primary care provider send for your hospital records if they have not received them.  Once you are discharged, your primary care physician will handle any further medical issues. Please note that NO REFILLS for any discharge medications will be authorized once you are discharged, as it is imperative that you return to your primary care physician (or establish a relationship with a primary care physician if you do not have one) for your post hospital discharge needs so that they can reassess your need for medications and monitor your lab values.  Please get a complete blood count and chemistry panel checked by your Primary MD at your next visit, and again as instructed by your Primary MD.  Get Medicines reviewed and adjusted: Please take all your medications with you for your next visit with your Primary MD  Laboratory/radiological data: Please request your Primary MD to go over all hospital tests and procedure/radiological results at the follow up, please ask your primary care provider to get all Hospital records sent to his/her office.  In some cases, they will be blood work, cultures and biopsy results pending at the time of your discharge. Please request that your primary care provider follow up on these results.  If you are diabetic, please bring your blood sugar readings with you to your follow up appointment with primary care.    Please call and make your follow up appointments as soon as possible.    Also Note  the following: If you experience worsening of your admission symptoms, develop shortness of breath, life threatening emergency, suicidal or homicidal thoughts you must seek medical attention immediately by calling 911 or calling your MD immediately  if symptoms less severe.  You must read complete instructions/literature along with all the possible adverse reactions/side effects for all the Medicines you take and that have been prescribed to you. Take any new Medicines after you have completely understood and accpet all the possible adverse reactions/side effects.   Do not drive when taking Pain medications or sleeping medications (Benzodiazepines)  Do not take more than prescribed Pain, Sleep and Anxiety Medications. It is not advisable to combine anxiety,sleep and pain medications without talking with your primary care practitioner  Special Instructions: If you have smoked or chewed Tobacco  in the last 2 yrs please stop smoking, stop any regular Alcohol  and or any Recreational drug use.  Wear Seat belts while driving.  Do not drive if taking any narcotic, mind altering or controlled substances or recreational drugs or alcohol.       

## 2021-07-03 NOTE — Procedures (Signed)
Patient Name: [name] [name]  MRN: [id number]  Epilepsy Attending: Charlsie Quest  Referring Physician/Provider: Dr Beryle Beams Date: 07/02/2021 Duration: 23.24 mins  Patient history:  80 year old female who presents with confusion on awakening yesterday.  EEG to evaluate for seizures.  Level of alertness: Awake  AEDs during EEG study: None  Technical aspects: This EEG study was done with scalp electrodes positioned according to the 10-20 International system of electrode placement. Electrical activity was acquired at a sampling rate of 500Hz  and reviewed with a high frequency filter of 70Hz  and a low frequency filter of 1Hz . EEG data were recorded continuously and digitally stored.   Description: The posterior dominant rhythm consists of 9 Hz activity of moderate voltage (25-35 uV) seen predominantly in posterior head regions, symmetric and reactive to eye opening and eye closing. Hyperventilation and photic stimulation were not performed.     IMPRESSION: This study is within normal limits. No seizures or epileptiform discharges were seen throughout the recording.  [name] 

## 2021-07-03 NOTE — Progress Notes (Signed)
Nsg Discharge Note  Admit Date:  06/30/2021 Discharge date: 07/03/2021   [name] [name] to be D/C'd Home per MD order.  AVS completed.  Copy for chart, and copy for patient signed, and dated. Patient/caregiver able to verbalize understanding.  Discharge Medication: Allergies as of 07/03/2021       Reactions   Contrast Media [iodinated Diagnostic Agents] Other (See Comments)   Unknown reaction. Due to kidney issues. Do not use   Codeine Rash   Morphine And Related Other (See Comments)   Unknown Reaction   Other    Pt has multiple allergies per pt but unsure of all.    Penicillins Other (See Comments)   Unknown reaction        Medication List     STOP taking these medications    metoprolol tartrate 25 MG tablet Commonly known as: LOPRESSOR       TAKE these medications    Accu-Chek Guide test strip Generic drug: glucose blood Use as instructed to monitor glucose twice daily   Accu-Chek Softclix Lancets lancets Use as instructed   aspirin EC 81 MG tablet Take 1 tablet (81 mg total) by mouth daily. Swallow whole.   atorvastatin 40 MG tablet Commonly known as: LIPITOR TAKE 1 TABLET BY MOUTH AT BEDTIME.   blood glucose meter kit and supplies Dispense based on patient and insurance preference. Use up to four times daily as directed. (FOR ICD-10 E10.9, E11.9).   cetirizine 10 MG tablet Commonly known as: ZYRTEC Take 10 mg by mouth daily as needed (allergies).   fish oil-omega-3 fatty acids 1000 MG capsule Take 1 g by mouth 2 (two) times daily.   glipiZIDE 5 MG 24 hr tablet Commonly known as: GLUCOTROL XL Take 1 tablet (5 mg total) by mouth daily with breakfast.   HAIR SKIN & NAILS GUMMIES PO Take 2 each by mouth every morning.   linagliptin 5 MG Tabs tablet Commonly known as: Tradjenta Take 1 tablet (5 mg total) by mouth daily.   metFORMIN 500 MG tablet Commonly known as: GLUCOPHAGE Take 1 tablet (500 mg total) by mouth 2 (two) times daily with a meal.    multivitamin with minerals Tabs tablet Take 1 tablet by mouth daily.   nitroGLYCERIN 0.4 MG SL tablet Commonly known as: NITROSTAT Place 1 tablet (0.4 mg total) under the tongue every 5 (five) minutes x 3 doses as needed for chest pain (if no relief after 3rd dose, proceed to the ED for an evaluation or call 911).   polyethylene glycol 17 g packet Commonly known as: MIRALAX / GLYCOLAX Take 17 g by mouth daily as needed for mild constipation or moderate constipation.        Discharge Assessment: Vitals:   07/03/21 0747 07/03/21 1300  BP: (!) 104/45 (!) 138/55  Pulse: (!) 58 83  Resp: 18 20  Temp: 97.8 F (36.6 C) 98 F (36.7 C)  SpO2: 96% 100%   Skin clean, dry and intact without evidence of skin break down, no evidence of skin tears noted. IV catheter discontinued intact. Site without signs and symptoms of complications - no redness or edema noted at insertion site, patient denies c/o pain - only slight tenderness at site.  Dressing with slight pressure applied.  D/c Instructions-Education: Discharge instructions given to patient/family with verbalized understanding. D/c education completed with patient/family including follow up instructions, medication list, d/c activities limitations if indicated, with other d/c instructions as indicated by MD - patient able to verbalize understanding, all  questions fully answered. Patient instructed to return to ED, call 911, or call MD for any changes in condition.  Patient escorted via [place], and D/C home via private auto.  Dorcas Mcmurray, LPN 9/79/5369 2:23 PM

## 2021-07-05 ENCOUNTER — Other Ambulatory Visit: Payer: Self-pay

## 2021-07-05 ENCOUNTER — Telehealth: Payer: Self-pay

## 2021-07-05 DIAGNOSIS — E1121 Type 2 diabetes mellitus with diabetic nephropathy: Secondary | ICD-10-CM

## 2021-07-05 MED ORDER — GLIPIZIDE ER 5 MG PO TB24: 5.0000 mg | ORAL_TABLET | Freq: Every day | ORAL | 1 refills | Status: DC

## 2021-07-05 NOTE — Telephone Encounter (Signed)
Transition Care Management Unsuccessful Follow-up Telephone Call  Date of discharge and from where:  07/03/21 from [facility]   Attempts:  1st Attempt  Reason for unsuccessful TCM follow-up call:  Left voice message

## 2021-07-12 ENCOUNTER — Inpatient Hospital Stay: Payer: Medicare Other | Admitting: Nurse Practitioner

## 2021-07-17 ENCOUNTER — Ambulatory Visit (INDEPENDENT_AMBULATORY_CARE_PROVIDER_SITE_OTHER): Payer: Medicare Other | Admitting: Nurse Practitioner

## 2021-07-17 ENCOUNTER — Encounter: Payer: Self-pay | Admitting: Nurse Practitioner

## 2021-07-17 ENCOUNTER — Other Ambulatory Visit: Payer: Self-pay

## 2021-07-17 DIAGNOSIS — G459 Transient cerebral ischemic attack, unspecified: Secondary | ICD-10-CM

## 2021-07-17 DIAGNOSIS — I119 Hypertensive heart disease without heart failure: Secondary | ICD-10-CM | POA: Diagnosis not present

## 2021-07-17 DIAGNOSIS — K2100 Gastro-esophageal reflux disease with esophagitis, without bleeding: Secondary | ICD-10-CM

## 2021-07-17 DIAGNOSIS — E1121 Type 2 diabetes mellitus with diabetic nephropathy: Secondary | ICD-10-CM

## 2021-07-17 DIAGNOSIS — N1832 Chronic kidney disease, stage 3b: Secondary | ICD-10-CM

## 2021-07-17 MED ORDER — FAMOTIDINE 20 MG PO TABS: 20.0000 mg | ORAL_TABLET | Freq: Every day | ORAL | 1 refills | Status: DC

## 2021-07-17 NOTE — Assessment & Plan Note (Signed)
-  Rx. Famotidine -recheck in 1 month

## 2021-07-17 NOTE — Assessment & Plan Note (Signed)
-  no issues today -referral to neuro

## 2021-07-17 NOTE — Assessment & Plan Note (Signed)
-  followed by endocrinology -she had questions about metformin; hospital continued metformin and last endocrinology appt said take 500 mg BID

## 2021-07-17 NOTE — Assessment & Plan Note (Signed)
BP Readings from Last 3 Encounters:  07/17/21 (!) 117/54  07/03/21 (!) 138/55  06/28/21 105/65  -was restarted on metoprolol on discharge -no change to meds to day; no adverse med effects

## 2021-07-17 NOTE — Progress Notes (Signed)
Established Patient Office Visit  Subjective:  Patient ID: [name] [name], female    DOB: Sep 26, 1941  Age: 80 y.o. MRN: [id number]  CC:  Chief Complaint  Patient presents with   Transitions Of Care    Went to the ER for confusion that has since improved.    HPI [name] [name] presents for hospital follow-up. She was admitted for chest pain and left-sided weakness and left facial droop. TIA/CVA work-up was negative, and cardiology work-up was negative as well.  EEG was negative for epileptiform activity.  Her metoprolol was stopped d/t soft BP. BP is great today at 117/54.  She was recommended to f/u with neurology.  Past Medical History:  Diagnosis Date   Anxiety    CAD (coronary artery disease)    Multivessel status post CABG June 2021 - LIMA to LAD, SVG to distal RCA, SVG to OM1   Chronic obstructive pulmonary disease, unspecified ([place])    Depression    Essential hypertension    GERD    History of pneumonia    Incarcerated ventral hernia    Osteoarthritis    Type 2 diabetes mellitus ([place])     Past Surgical History:  Procedure Laterality Date   CORONARY ARTERY BYPASS GRAFT N/A 05/22/2020   Procedure: CORONARY ARTERY BYPASS GRAFTING (CABG) x3 LIMA TO LAD, SVG TO OM1, SVG TO DISTAL RIGHT;  Surgeon: Lajuana Matte, MD;  Location: [facility];  Service: Open Heart Surgery;  Laterality: N/A;   LEFT HEART CATH AND CORONARY ANGIOGRAPHY N/A 05/21/2020   Procedure: LEFT HEART CATH AND CORONARY ANGIOGRAPHY;  Surgeon: Martinique, Peter M, MD;  Location: [facility];  Service: Cardiovascular;  Laterality: N/A;   NEPHRECTOMY     TEE WITHOUT CARDIOVERSION N/A 05/22/2020   Procedure: TRANSESOPHAGEAL ECHOCARDIOGRAM (TEE);  Surgeon: Lajuana Matte, MD;  Location: [facility];  Service: Open Heart Surgery;  Laterality: N/A;   VEIN HARVEST Right 05/22/2020   Procedure: Open Vein Harvest;  Surgeon: Lajuana Matte, MD;  Location: [facility];  Service: Open Heart Surgery;  Laterality: Right;    VENTRAL HERNIA REPAIR  09/01/2017   mistaken entry    Family History  Problem Relation Age of Onset   CAD Father    Heart failure Sister     Social History   Socioeconomic History   Marital status: Widowed    Spouse name: Not on file   Number of children: 1   Years of education: 23   Highest education level: 12th grade  Occupational History   Occupation: Retired  Tobacco Use   Smoking status: Never   Smokeless tobacco: Never  Vaping Use   Vaping Use: Never used  Substance and Sexual Activity   Alcohol use: No   Drug use: No   Sexual activity: Not Currently  Other Topics Concern   Not on file  Social History Narrative   Lives alone.  Has 1 daughter      Enjoys reading and cooking      Eats all food groups   Caffeine, some coffee   Water reports drinking pretty well throughout the day      Social Determinants of Health   Financial Resource Strain: Low Risk    Difficulty of Paying Living Expenses: Not hard at all  Food Insecurity: No Food Insecurity   Worried About Charity fundraiser in the Last Year: Never true   [place] in the Last Year: Never true  Transportation Needs: No  Transportation Needs   Lack of Transportation (Medical): No   Lack of Transportation (Non-Medical): No  Physical Activity: Inactive   Days of Exercise per Week: 0 days   Minutes of Exercise per Session: 0 min  Stress: No Stress Concern Present   Feeling of Stress : Only a little  Social Connections: Socially Isolated   Frequency of Communication with Friends and Family: Twice a week   Frequency of Social Gatherings with Friends and Family: Once a week   Attends Religious Services: Never   Marine scientist or Organizations: No   Attends Archivist Meetings: Never   Marital Status: Widowed  Human resources officer Violence: Not At Risk   Fear of Current or Ex-Partner: No   Emotionally Abused: No   Physically Abused: No   Sexually Abused: No    Outpatient  Medications Prior to Visit  Medication Sig Dispense Refill   Accu-Chek Softclix Lancets lancets Use as instructed 100 each 12   aspirin EC 81 MG tablet Take 1 tablet (81 mg total) by mouth daily. Swallow whole. 90 tablet 3   atorvastatin (LIPITOR) 40 MG tablet TAKE 1 TABLET BY MOUTH AT BEDTIME. 90 tablet 3   Biotin w/ Vitamins C & E (HAIR SKIN & NAILS GUMMIES PO) Take 2 each by mouth every morning.     blood glucose meter kit and supplies Dispense based on patient and insurance preference. Use up to four times daily as directed. (FOR ICD-10 E10.9, E11.9). 1 each 0   cetirizine (ZYRTEC) 10 MG tablet Take 10 mg by mouth daily as needed (allergies).     fish oil-omega-3 fatty acids 1000 MG capsule Take 1 g by mouth 2 (two) times daily.     glipiZIDE (GLUCOTROL XL) 5 MG 24 hr tablet Take 1 tablet (5 mg total) by mouth daily with breakfast. 30 tablet 1   glucose blood (ACCU-CHEK GUIDE) test strip Use as instructed to monitor glucose twice daily 100 each 12   linagliptin (TRADJENTA) 5 MG TABS tablet Take 1 tablet (5 mg total) by mouth daily. 30 tablet 0   metFORMIN (GLUCOPHAGE) 500 MG tablet Take 1 tablet (500 mg total) by mouth 2 (two) times daily with a meal. 180 tablet 3   Multiple Vitamin (MULITIVITAMIN WITH MINERALS) TABS Take 1 tablet by mouth daily.     nitroGLYCERIN (NITROSTAT) 0.4 MG SL tablet Place 1 tablet (0.4 mg total) under the tongue every 5 (five) minutes x 3 doses as needed for chest pain (if no relief after 3rd dose, proceed to the ED for an evaluation or call 911). 25 tablet 3   polyethylene glycol (MIRALAX / GLYCOLAX) 17 g packet Take 17 g by mouth daily as needed for mild constipation or moderate constipation.     No facility-administered medications prior to visit.    Allergies  Allergen Reactions   Contrast Media [Iodinated Diagnostic Agents] Other (See Comments)    Unknown reaction. Due to kidney issues. Do not use   Codeine Rash   Morphine And Related Other (See Comments)     Unknown Reaction   Other     Pt has multiple allergies per pt but unsure of all.    Penicillins Other (See Comments)    Unknown reaction    ROS Review of Systems  Constitutional: Negative.   Respiratory: Negative.    Cardiovascular:  Positive for chest pain. Negative for palpitations and leg swelling.       Better since hospitalization, but she states she  has heaviness and pulling when she bends over  Psychiatric/Behavioral: Negative.       Objective:    Physical Exam Constitutional:      Appearance: Normal appearance.  Cardiovascular:     Rate and Rhythm: Normal rate and regular rhythm.     Pulses: Normal pulses.     Heart sounds: Normal heart sounds.  Pulmonary:     Effort: Pulmonary effort is normal.     Breath sounds: Normal breath sounds.  Neurological:     Mental Status: She is alert.  Psychiatric:     Comments: verbose    BP (!) 117/54 (BP Location: Left Arm, Patient Position: Sitting, Cuff Size: Normal)   Pulse 90   Temp 98 F (36.7 C) (Temporal)   Ht 4' 11"  (1.499 m)   Wt 152 lb (68.9 kg)   SpO2 94%   BMI 30.70 kg/m  Wt Readings from Last 3 Encounters:  07/17/21 152 lb (68.9 kg)  06/30/21 156 lb 1.4 oz (70.8 kg)  06/28/21 155 lb 3.2 oz (70.4 kg)     Health Maintenance Due  Topic Date Due   OPHTHALMOLOGY EXAM  Never done   Hepatitis C Screening  Never done   DEXA SCAN  Never done   FOOT EXAM  03/07/2021   INFLUENZA VACCINE  07/15/2021    There are no preventive care reminders to display for this patient.  Lab Results  Component Value Date   TSH 2.02 11/17/2019   Lab Results  Component Value Date   WBC 7.5 07/02/2021   HGB 11.7 (L) 07/02/2021   HCT 35.4 (L) 07/02/2021   MCV 96.5 07/02/2021   PLT 188 07/02/2021   Lab Results  Component Value Date   NA 137 07/02/2021   K 4.5 07/02/2021   CO2 25 07/02/2021   GLUCOSE 167 (H) 07/02/2021   BUN 28 (H) 07/02/2021   CREATININE 1.52 (H) 07/02/2021   BILITOT 0.8 06/28/2021   ALKPHOS 51  06/28/2021   AST 26 06/28/2021   ALT 30 06/28/2021   PROT 7.3 06/28/2021   ALBUMIN 3.9 06/28/2021   CALCIUM 8.6 (L) 07/02/2021   ANIONGAP 8 07/02/2021   Lab Results  Component Value Date   CHOL 89 06/28/2021   Lab Results  Component Value Date   HDL 31 (L) 06/28/2021   Lab Results  Component Value Date   LDLCALC 24 06/28/2021   Lab Results  Component Value Date   TRIG 172 (H) 06/28/2021   Lab Results  Component Value Date   CHOLHDL 2.9 06/28/2021   Lab Results  Component Value Date   HGBA1C 6.2 (H) 06/30/2021      Assessment & Plan:   Problem List Items Addressed This Visit       Cardiovascular and Mediastinum   Hypertensive heart disease without heart failure    BP Readings from Last 3 Encounters:  07/17/21 (!) 117/54  07/03/21 (!) 138/55  06/28/21 105/65  -was restarted on metoprolol on discharge -no change to meds to day; no adverse med effects      TIA (transient ischemic attack)    -no issues today -referral to neuro       Relevant Orders   Ambulatory referral to Neurology   Basic metabolic panel   CBC with Differential/Platelet     Digestive   Gastro-esophageal reflux disease with esophagitis    -Rx. Famotidine -recheck in 1 month       Relevant Medications   famotidine (PEPCID) 20 MG tablet  Endocrine   Type 2 diabetes mellitus with diabetic nephropathy ([place])    -followed by endocrinology -she had questions about metformin; hospital continued metformin and last endocrinology appt said take 500 mg BID         Genitourinary   Chronic kidney disease, stage 3b ([place])    Lab Results  Component Value Date   CREATININE 1.52 (H) 07/02/2021   BUN 28 (H) 07/02/2021   NA 137 07/02/2021   K 4.5 07/02/2021   CL 104 07/02/2021   CO2 25 07/02/2021  -will defer decisions about metformin to endocrinology      Relevant Orders   Basic metabolic panel   CBC with Differential/Platelet    Meds ordered this encounter  Medications    famotidine (PEPCID) 20 MG tablet    Sig: Take 1 tablet (20 mg total) by mouth daily.    Dispense:  60 tablet    Refill:  1    Follow-up: Return in about 1 month (around 08/17/2021) for Med check (famotidine).    [name], NP

## 2021-07-17 NOTE — Assessment & Plan Note (Addendum)
Lab Results  Component Value Date   CREATININE 1.52 (H) 07/02/2021   BUN 28 (H) 07/02/2021   NA 137 07/02/2021   K 4.5 07/02/2021   CL 104 07/02/2021   CO2 25 07/02/2021   -will defer decisions about metformin to endocrinology

## 2021-07-17 NOTE — Patient Instructions (Signed)
Please follow-up with endocrinology regarding your metformin. Your renal function is a little low, but you are on a low dose of metformin, so they will decide whether to continue this or to change medications.

## 2021-07-26 ENCOUNTER — Telehealth: Payer: Self-pay

## 2021-07-26 ENCOUNTER — Other Ambulatory Visit: Payer: Self-pay

## 2021-07-26 MED ORDER — LINAGLIPTIN 5 MG PO TABS: 5.0000 mg | ORAL_TABLET | Freq: Every day | ORAL | 0 refills | Status: DC

## 2021-07-26 NOTE — Telephone Encounter (Signed)
Patient needing refill on tradjenta ph# [phone]

## 2021-07-26 NOTE — Telephone Encounter (Signed)
Rx sent 

## 2021-08-21 ENCOUNTER — Encounter: Payer: Self-pay | Admitting: Nurse Practitioner

## 2021-08-21 ENCOUNTER — Ambulatory Visit: Payer: Medicare Other | Admitting: Nurse Practitioner

## 2021-08-21 ENCOUNTER — Ambulatory Visit (INDEPENDENT_AMBULATORY_CARE_PROVIDER_SITE_OTHER): Payer: Medicare Other | Admitting: Nurse Practitioner

## 2021-08-21 ENCOUNTER — Other Ambulatory Visit: Payer: Self-pay

## 2021-08-21 VITALS — BP 102/57 | HR 76 | Temp 98.2°F | Ht 59.0 in | Wt 150.0 lb

## 2021-08-21 DIAGNOSIS — I119 Hypertensive heart disease without heart failure: Secondary | ICD-10-CM

## 2021-08-21 DIAGNOSIS — G459 Transient cerebral ischemic attack, unspecified: Secondary | ICD-10-CM

## 2021-08-21 DIAGNOSIS — E782 Mixed hyperlipidemia: Secondary | ICD-10-CM | POA: Diagnosis not present

## 2021-08-21 DIAGNOSIS — N1832 Chronic kidney disease, stage 3b: Secondary | ICD-10-CM | POA: Diagnosis not present

## 2021-08-21 DIAGNOSIS — K2100 Gastro-esophageal reflux disease with esophagitis, without bleeding: Secondary | ICD-10-CM

## 2021-08-21 NOTE — Assessment & Plan Note (Signed)
-  we referred to neuro at last OV -she didn't make an appt with Dr. Gerilyn Pilgrim and states she leaves a message on the machine, but nobody picks up -referral to [facility]

## 2021-08-21 NOTE — Patient Instructions (Signed)
Please have fasting labs drawn 2-3 days prior to your appointment so we can discuss the results during your office visit.  

## 2021-08-21 NOTE — Assessment & Plan Note (Signed)
-  states she isn't having any issues with GERD after starting famotidine

## 2021-08-21 NOTE — Progress Notes (Signed)
Acute Office Visit  Subjective:    Patient ID: [name] [name], female    DOB: Jun 30, 1941, 80 y.o.   MRN: [id number]  Chief Complaint  Patient presents with   Diabetes    Follow up    Diabetes  Patient is in today for GERD. She was seen in-office a month ago, and she was started on famotidine. She states she had had no further issues.  She had hx of left-sided weakness and facial droop, and hospital w/u was negative for CVA. She had neuro referral, but hasn't made an appt.   Past Medical History:  Diagnosis Date   Anxiety    CAD (coronary artery disease)    Multivessel status post CABG June 2021 - LIMA to LAD, SVG to distal RCA, SVG to OM1   Chronic obstructive pulmonary disease, unspecified ([place])    Depression    Essential hypertension    GERD    History of pneumonia    Incarcerated ventral hernia    Osteoarthritis    Type 2 diabetes mellitus ([place])     Past Surgical History:  Procedure Laterality Date   CORONARY ARTERY BYPASS GRAFT N/A 05/22/2020   Procedure: CORONARY ARTERY BYPASS GRAFTING (CABG) x3 LIMA TO LAD, SVG TO OM1, SVG TO DISTAL RIGHT;  Surgeon: Lajuana Matte, MD;  Location: [facility];  Service: Open Heart Surgery;  Laterality: N/A;   LEFT HEART CATH AND CORONARY ANGIOGRAPHY N/A 05/21/2020   Procedure: LEFT HEART CATH AND CORONARY ANGIOGRAPHY;  Surgeon: Martinique, Peter M, MD;  Location: [facility];  Service: Cardiovascular;  Laterality: N/A;   NEPHRECTOMY     TEE WITHOUT CARDIOVERSION N/A 05/22/2020   Procedure: TRANSESOPHAGEAL ECHOCARDIOGRAM (TEE);  Surgeon: Lajuana Matte, MD;  Location: [facility];  Service: Open Heart Surgery;  Laterality: N/A;   VEIN HARVEST Right 05/22/2020   Procedure: Open Vein Harvest;  Surgeon: Lajuana Matte, MD;  Location: [facility];  Service: Open Heart Surgery;  Laterality: Right;   VENTRAL HERNIA REPAIR  09/01/2017   mistaken entry    Family History  Problem Relation Age of Onset   CAD Father    Heart failure Sister      Social History   Socioeconomic History   Marital status: Widowed    Spouse name: Not on file   Number of children: 1   Years of education: 44   Highest education level: 12th grade  Occupational History   Occupation: Retired  Tobacco Use   Smoking status: Never   Smokeless tobacco: Never  Vaping Use   Vaping Use: Never used  Substance and Sexual Activity   Alcohol use: No   Drug use: No   Sexual activity: Not Currently  Other Topics Concern   Not on file  Social History Narrative   Lives alone.  Has 1 daughter      Enjoys reading and cooking      Eats all food groups   Caffeine, some coffee   Water reports drinking pretty well throughout the day      Social Determinants of Health   Financial Resource Strain: Low Risk    Difficulty of Paying Living Expenses: Not hard at all  Food Insecurity: No Food Insecurity   Worried About Charity fundraiser in the Last Year: Never true   Arboriculturist in the Last Year: Never true  Transportation Needs: No Transportation Needs   Lack of Transportation (Medical): No   Lack of Transportation (Non-Medical): No  Physical Activity: Inactive   Days of Exercise per Week: 0 days   Minutes of Exercise per Session: 0 min  Stress: No Stress Concern Present   Feeling of Stress : Only a little  Social Connections: Socially Isolated   Frequency of Communication with Friends and Family: Twice a week   Frequency of Social Gatherings with Friends and Family: Once a week   Attends Religious Services: Never   Marine scientist or Organizations: No   Attends Archivist Meetings: Never   Marital Status: Widowed  Human resources officer Violence: Not At Risk   Fear of Current or Ex-Partner: No   Emotionally Abused: No   Physically Abused: No   Sexually Abused: No    Outpatient Medications Prior to Visit  Medication Sig Dispense Refill   Accu-Chek Softclix Lancets lancets Use as instructed 100 each 12   aspirin EC 81 MG  tablet Take 1 tablet (81 mg total) by mouth daily. Swallow whole. 90 tablet 3   atorvastatin (LIPITOR) 40 MG tablet TAKE 1 TABLET BY MOUTH AT BEDTIME. 90 tablet 3   Biotin w/ Vitamins C & E (HAIR SKIN & NAILS GUMMIES PO) Take 2 each by mouth every morning.     blood glucose meter kit and supplies Dispense based on patient and insurance preference. Use up to four times daily as directed. (FOR ICD-10 E10.9, E11.9). 1 each 0   cetirizine (ZYRTEC) 10 MG tablet Take 10 mg by mouth daily as needed (allergies).     famotidine (PEPCID) 20 MG tablet Take 1 tablet (20 mg total) by mouth daily. 60 tablet 1   fish oil-omega-3 fatty acids 1000 MG capsule Take 1 g by mouth 2 (two) times daily.     glipiZIDE (GLUCOTROL XL) 5 MG 24 hr tablet Take 1 tablet (5 mg total) by mouth daily with breakfast. 30 tablet 1   glucose blood (ACCU-CHEK GUIDE) test strip Use as instructed to monitor glucose twice daily 100 each 12   linagliptin (TRADJENTA) 5 MG TABS tablet Take 1 tablet (5 mg total) by mouth daily. 30 tablet 0   metFORMIN (GLUCOPHAGE) 500 MG tablet Take 1 tablet (500 mg total) by mouth 2 (two) times daily with a meal. 180 tablet 3   Multiple Vitamin (MULITIVITAMIN WITH MINERALS) TABS Take 1 tablet by mouth daily.     nitroGLYCERIN (NITROSTAT) 0.4 MG SL tablet Place 1 tablet (0.4 mg total) under the tongue every 5 (five) minutes x 3 doses as needed for chest pain (if no relief after 3rd dose, proceed to the ED for an evaluation or call 911). 25 tablet 3   polyethylene glycol (MIRALAX / GLYCOLAX) 17 g packet Take 17 g by mouth daily as needed for mild constipation or moderate constipation.     No facility-administered medications prior to visit.    Allergies  Allergen Reactions   Contrast Media [Iodinated Diagnostic Agents] Other (See Comments)    Unknown reaction. Due to kidney issues. Do not use   Codeine Rash   Morphine And Related Other (See Comments)    Unknown Reaction   Other     Pt has multiple  allergies per pt but unsure of all.    Penicillins Other (See Comments)    Unknown reaction    Review of Systems  Constitutional: Negative.   Respiratory: Negative.    Cardiovascular: Negative.   Neurological: Negative.   Psychiatric/Behavioral: Negative.        Objective:    Physical Exam Constitutional:  Appearance: Normal appearance.  Cardiovascular:     Rate and Rhythm: Normal rate and regular rhythm.     Pulses: Normal pulses.     Heart sounds: Normal heart sounds.  Pulmonary:     Effort: Pulmonary effort is normal.     Breath sounds: Normal breath sounds.  Neurological:     General: No focal deficit present.     Mental Status: She is alert and oriented to person, place, and time.     Cranial Nerves: No cranial nerve deficit.     Motor: No weakness.  Psychiatric:     Comments: Has trouble remembering her Dr. appointments    BP (!) 102/57 (BP Location: Left Arm, Patient Position: Sitting, Cuff Size: Large)   Pulse 76   Temp 98.2 F (36.8 C) (Oral)   Ht 4' 11"  (1.499 m)   Wt 150 lb (68 kg)   SpO2 94%   BMI 30.30 kg/m  Wt Readings from Last 3 Encounters:  08/21/21 150 lb (68 kg)  07/17/21 152 lb (68.9 kg)  06/30/21 156 lb 1.4 oz (70.8 kg)    Health Maintenance Due  Topic Date Due   OPHTHALMOLOGY EXAM  Never done   Hepatitis C Screening  Never done   DEXA SCAN  Never done   FOOT EXAM  03/07/2021    There are no preventive care reminders to display for this patient.   Lab Results  Component Value Date   TSH 2.02 11/17/2019   Lab Results  Component Value Date   WBC 7.5 07/02/2021   HGB 11.7 (L) 07/02/2021   HCT 35.4 (L) 07/02/2021   MCV 96.5 07/02/2021   PLT 188 07/02/2021   Lab Results  Component Value Date   NA 137 07/02/2021   K 4.5 07/02/2021   CO2 25 07/02/2021   GLUCOSE 167 (H) 07/02/2021   BUN 28 (H) 07/02/2021   CREATININE 1.52 (H) 07/02/2021   BILITOT 0.8 06/28/2021   ALKPHOS 51 06/28/2021   AST 26 06/28/2021   ALT 30  06/28/2021   PROT 7.3 06/28/2021   ALBUMIN 3.9 06/28/2021   CALCIUM 8.6 (L) 07/02/2021   ANIONGAP 8 07/02/2021   Lab Results  Component Value Date   CHOL 89 06/28/2021   Lab Results  Component Value Date   HDL 31 (L) 06/28/2021   Lab Results  Component Value Date   LDLCALC 24 06/28/2021   Lab Results  Component Value Date   TRIG 172 (H) 06/28/2021   Lab Results  Component Value Date   CHOLHDL 2.9 06/28/2021   Lab Results  Component Value Date   HGBA1C 6.2 (H) 06/30/2021       Assessment & Plan:   Problem List Items Addressed This Visit       Cardiovascular and Mediastinum   Hypertensive heart disease without heart failure - Primary   Relevant Orders   CBC with Differential/Platelet   CMP14+EGFR   Lipid Panel With LDL/HDL Ratio   TIA (transient ischemic attack)    -we referred to neuro at last [facility] -she didn't make an appt with Dr. Merlene Laughter and states she leaves a message on the machine, but nobody picks up -referral to [facility]      Relevant Orders   Ambulatory referral to Neurology     Digestive   Gastro-esophageal reflux disease with esophagitis    -states she isn't having any issues with GERD after starting famotidine        Genitourinary   Chronic kidney disease, stage  3b ([place])   Relevant Orders   CMP14+EGFR     Other   Mixed hyperlipidemia   Relevant Orders   Lipid Panel With LDL/HDL Ratio     No orders of the defined types were placed in this encounter.    [name], NP

## 2021-08-22 ENCOUNTER — Other Ambulatory Visit: Payer: Self-pay | Admitting: Nurse Practitioner

## 2021-08-22 ENCOUNTER — Telehealth: Payer: Self-pay

## 2021-08-22 DIAGNOSIS — E559 Vitamin D deficiency, unspecified: Secondary | ICD-10-CM | POA: Diagnosis not present

## 2021-08-22 DIAGNOSIS — Z905 Acquired absence of kidney: Secondary | ICD-10-CM

## 2021-08-22 DIAGNOSIS — E1121 Type 2 diabetes mellitus with diabetic nephropathy: Secondary | ICD-10-CM

## 2021-08-22 NOTE — Telephone Encounter (Signed)
Patient said she does not want to be seen at [facility] , she wants to only be seen at a [facility]. She is asking for a call back [phone]

## 2021-08-22 NOTE — Telephone Encounter (Signed)
I have entered new referral to [facility] in [place] since she prefers to see a nephrologist within [facility].  I still recommend she sees a kidney specialist given her history of nephrectomy and diabetes.  We can discuss this further at her appointment next week.

## 2021-08-22 NOTE — Progress Notes (Signed)
I have entered new referral to [facility] as she prefers to see a nephrologist within [facility].

## 2021-08-23 LAB — LIPID PANEL
Chol/HDL Ratio: 3.2 ratio (ref 0.0–4.4)
Cholesterol, Total: 102 mg/dL (ref 100–199)
HDL: 32 mg/dL — ABNORMAL LOW (ref >39)
LDL Chol Calc (NIH): 47 mg/dL (ref 0–99)
Triglycerides: 128 mg/dL (ref 0–149)
VLDL Cholesterol Cal: 23 mg/dL (ref 5–40)

## 2021-08-23 LAB — COMPREHENSIVE METABOLIC PANEL
ALT: 26 IU/L (ref 0–32)
AST: 20 IU/L (ref 0–40)
Albumin/Globulin Ratio: 2.2 (ref 1.2–2.2)
Albumin: 4.6 g/dL (ref 3.7–4.7)
Alkaline Phosphatase: 61 IU/L (ref 44–121)
BUN/Creatinine Ratio: 18 (ref 12–28)
BUN: 22 mg/dL (ref 8–27)
Bilirubin Total: 0.4 mg/dL (ref 0.0–1.2)
CO2: 18 mmol/L — ABNORMAL LOW (ref 20–29)
Calcium: 8.9 mg/dL (ref 8.7–10.3)
Chloride: 105 mmol/L (ref 96–106)
Creatinine, Ser: 1.22 mg/dL — ABNORMAL HIGH (ref 0.57–1.00)
Globulin, Total: 2.1 g/dL (ref 1.5–4.5)
Glucose: 109 mg/dL — ABNORMAL HIGH (ref 65–99)
Potassium: 4.8 mmol/L (ref 3.5–5.2)
Sodium: 141 mmol/L (ref 134–144)
Total Protein: 6.7 g/dL (ref 6.0–8.5)
eGFR: 45 mL/min/{1.73_m2} — ABNORMAL LOW (ref >59)

## 2021-08-23 LAB — TSH: TSH: 3.04 u[IU]/mL (ref 0.450–4.500)

## 2021-08-23 LAB — VITAMIN D 25 HYDROXY (VIT D DEFICIENCY, FRACTURES): Vit D, 25-Hydroxy: 113 ng/mL — ABNORMAL HIGH (ref 30.0–100.0)

## 2021-08-23 LAB — T4, FREE: Free T4: 1.25 ng/dL (ref 0.82–1.77)

## 2021-08-28 ENCOUNTER — Other Ambulatory Visit (HOSPITAL_COMMUNITY): Payer: Self-pay | Admitting: Nephrology

## 2021-08-28 ENCOUNTER — Other Ambulatory Visit: Payer: Self-pay | Admitting: Nephrology

## 2021-08-28 ENCOUNTER — Ambulatory Visit: Payer: Medicare Other | Admitting: Nurse Practitioner

## 2021-08-28 ENCOUNTER — Other Ambulatory Visit: Payer: Self-pay

## 2021-08-28 DIAGNOSIS — E1122 Type 2 diabetes mellitus with diabetic chronic kidney disease: Secondary | ICD-10-CM

## 2021-08-28 DIAGNOSIS — T452X1A Poisoning by vitamins, accidental (unintentional), initial encounter: Secondary | ICD-10-CM | POA: Diagnosis not present

## 2021-08-28 DIAGNOSIS — E872 Acidosis, unspecified: Secondary | ICD-10-CM

## 2021-08-28 DIAGNOSIS — N189 Chronic kidney disease, unspecified: Secondary | ICD-10-CM | POA: Diagnosis not present

## 2021-08-28 DIAGNOSIS — Z905 Acquired absence of kidney: Secondary | ICD-10-CM | POA: Diagnosis not present

## 2021-08-28 DIAGNOSIS — I129 Hypertensive chronic kidney disease with stage 1 through stage 4 chronic kidney disease, or unspecified chronic kidney disease: Secondary | ICD-10-CM | POA: Diagnosis not present

## 2021-08-28 DIAGNOSIS — D638 Anemia in other chronic diseases classified elsewhere: Secondary | ICD-10-CM

## 2021-08-28 DIAGNOSIS — E1121 Type 2 diabetes mellitus with diabetic nephropathy: Secondary | ICD-10-CM

## 2021-08-28 MED ORDER — GLIPIZIDE ER 5 MG PO TB24
5.0000 mg | ORAL_TABLET | Freq: Every day | ORAL | 0 refills | Status: DC
Start: 2021-08-28 — End: 2021-09-30

## 2021-08-28 MED ORDER — GLUCOSE BLOOD VI STRP: ORAL_STRIP | 1 refills | Status: AC

## 2021-08-28 MED ORDER — LINAGLIPTIN 5 MG PO TABS: 5.0000 mg | ORAL_TABLET | Freq: Every day | ORAL | 0 refills | Status: DC

## 2021-08-29 IMAGING — DX DG ANKLE COMPLETE 3+V*L*
3 series · 3 of 3 positions shown · non-contrast
Comparison: None.

CLINICAL DATA: Left leg pain.  No injury.

EXAM:
LEFT ANKLE COMPLETE - 3+ VIEW

[ankle ap]
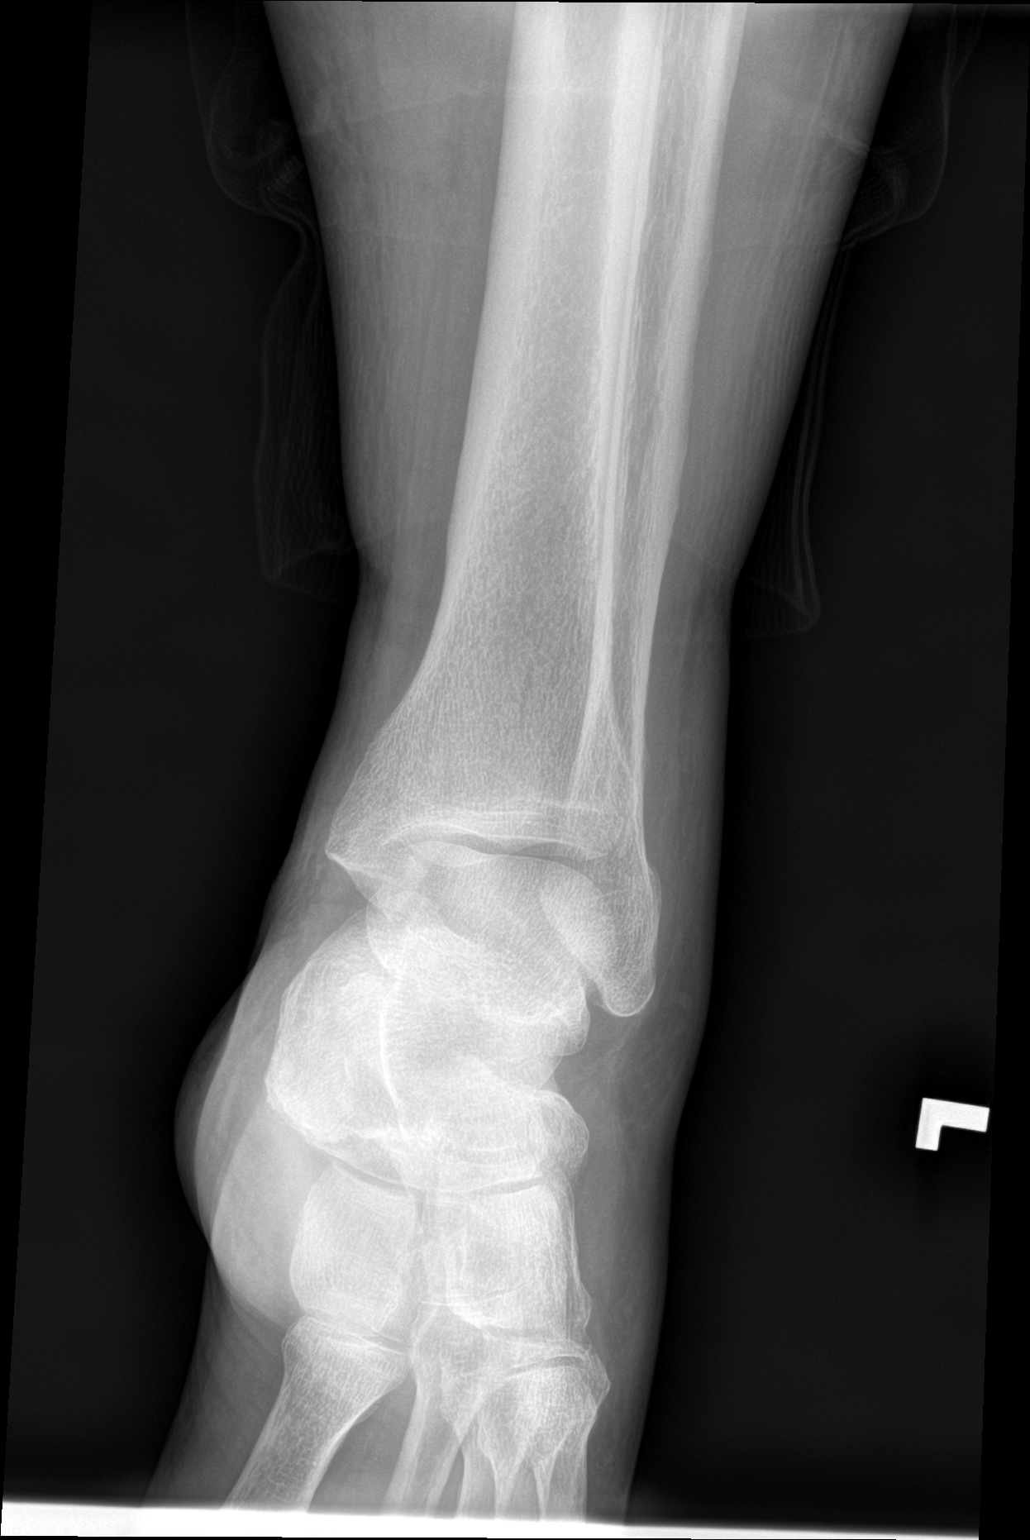

[ankle obl]
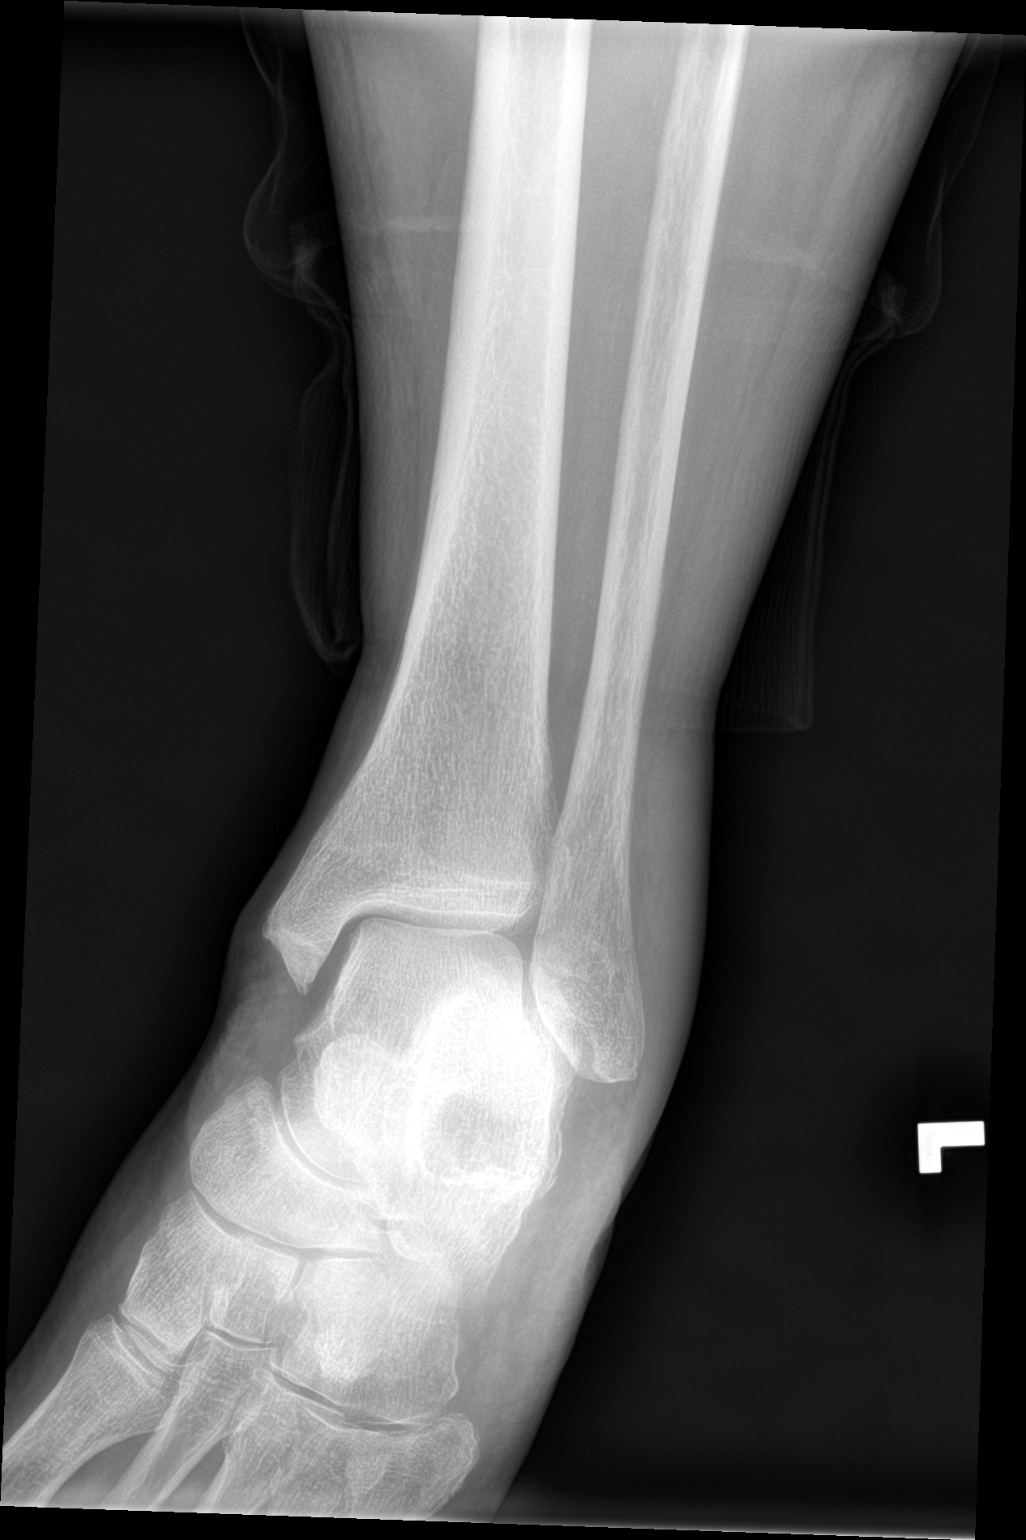

[ankle lat]
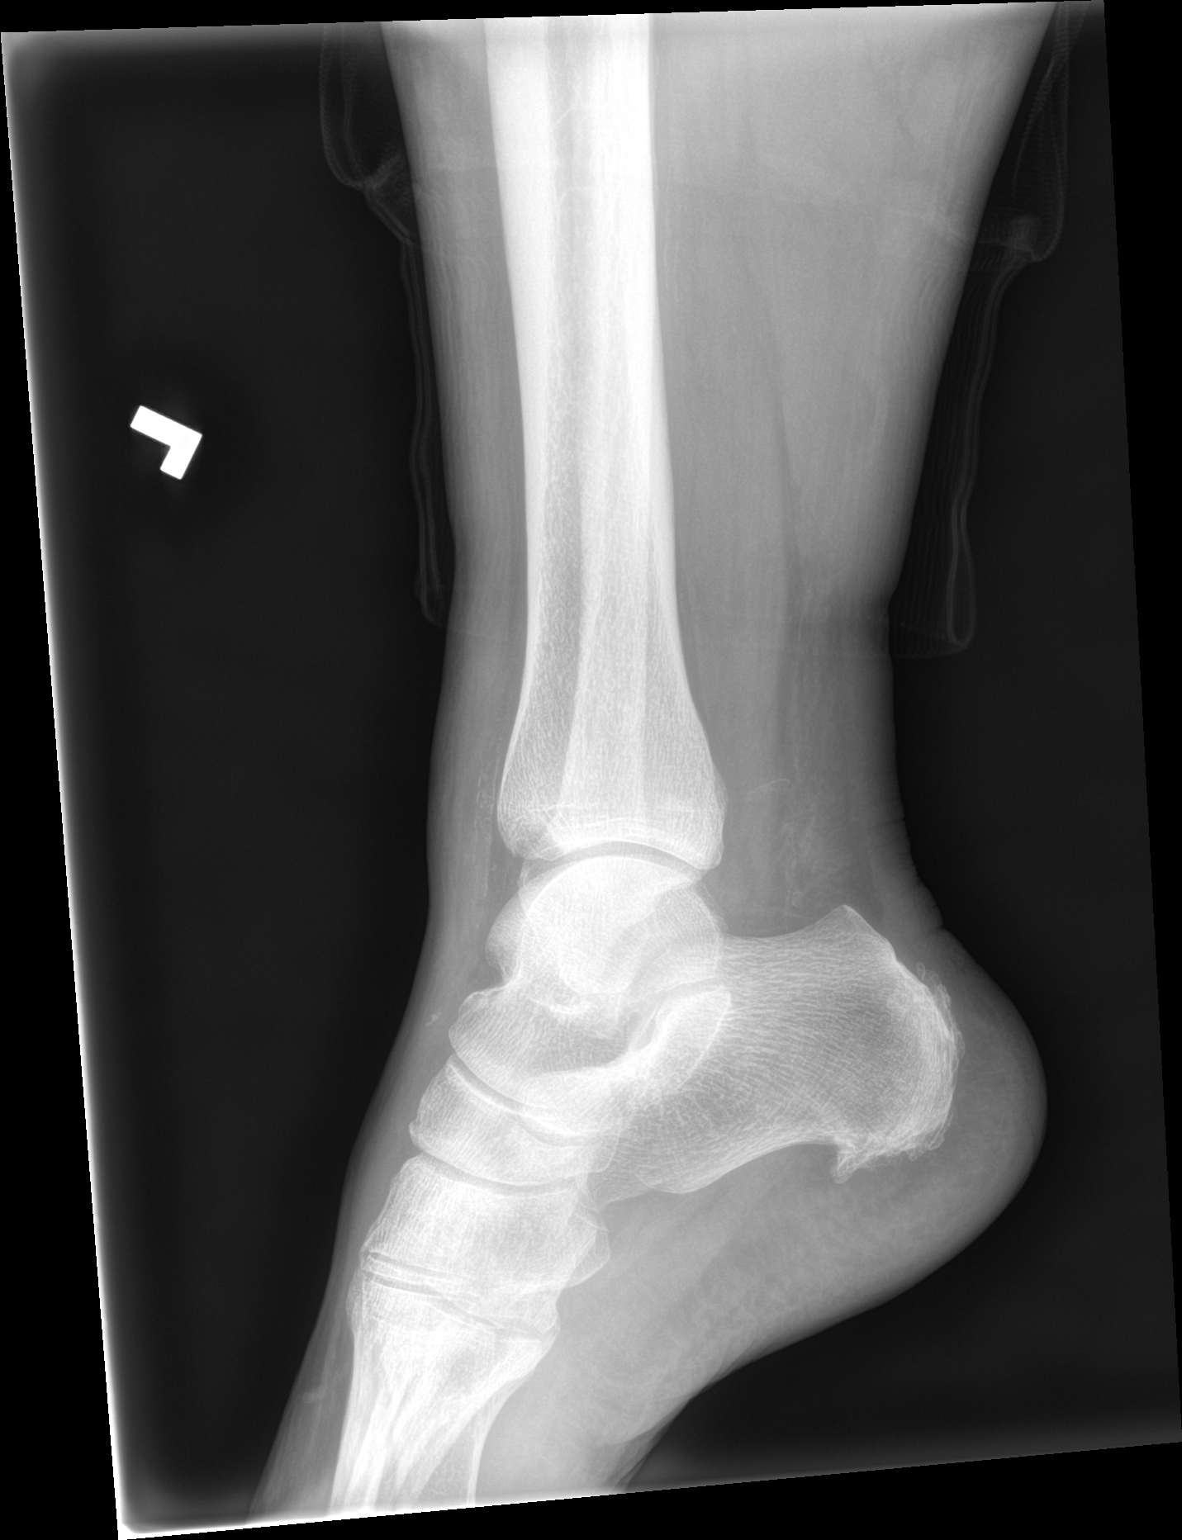

[3 of 3 positions shown; findings below may reference images not displayed]

FINDINGS: Plantar calcaneal spur. No acute bony abnormality. Specifically, no
fracture, subluxation, or dislocation.
IMPRESSION: Negative.

## 2021-09-02 NOTE — Patient Instructions (Signed)
Diabetes Mellitus and Nutrition, Adult When you have diabetes, or diabetes mellitus, it is very important to have healthy eating habits because your blood sugar (glucose) levels are greatly affected by what you eat and drink. Eating healthy foods in the right amounts, at about the same times every day, can help you:  Control your blood glucose.  Lower your risk of heart disease.  Improve your blood pressure.  Reach or maintain a healthy weight. What can affect my meal plan? Every person with diabetes is different, and each person has different needs for a meal plan. Your health care provider may recommend that you work with a dietitian to make a meal plan that is best for you. Your meal plan may vary depending on factors such as:  The calories you need.  The medicines you take.  Your weight.  Your blood glucose, blood pressure, and cholesterol levels.  Your activity level.  Other health conditions you have, such as heart or kidney disease. How do carbohydrates affect me? Carbohydrates, also called carbs, affect your blood glucose level more than any other type of food. Eating carbs naturally raises the amount of glucose in your blood. Carb counting is a method for keeping track of how many carbs you eat. Counting carbs is important to keep your blood glucose at a healthy level, especially if you use insulin or take certain oral diabetes medicines. It is important to know how many carbs you can safely have in each meal. This is different for every person. Your dietitian can help you calculate how many carbs you should have at each meal and for each snack. How does alcohol affect me? Alcohol can cause a sudden decrease in blood glucose (hypoglycemia), especially if you use insulin or take certain oral diabetes medicines. Hypoglycemia can be a life-threatening condition. Symptoms of hypoglycemia, such as sleepiness, dizziness, and confusion, are similar to symptoms of having too much  alcohol.  Do not drink alcohol if: ? Your health care provider tells you not to drink. ? You are pregnant, may be pregnant, or are planning to become pregnant.  If you drink alcohol: ? Do not drink on an empty stomach. ? Limit how much you use to:  0-1 drink a day for women.  0-2 drinks a day for men. ? Be aware of how much alcohol is in your drink. In the [place] one drink equals one 12 oz bottle of beer (355 mL), one 5 oz glass of wine (148 mL), or one 1 oz glass of hard liquor (44 mL). ? Keep yourself hydrated with water, diet soda, or unsweetened iced tea.  Keep in mind that regular soda, juice, and other mixers may contain a lot of sugar and must be counted as carbs. What are tips for following this plan? Reading food labels  Start by checking the serving size on the "Nutrition Facts" label of packaged foods and drinks. The amount of calories, carbs, fats, and other nutrients listed on the label is based on one serving of the item. Many items contain more than one serving per package.  Check the total grams (g) of carbs in one serving. You can calculate the number of servings of carbs in one serving by dividing the total carbs by 15. For example, if a food has 30 g of total carbs per serving, it would be equal to 2 servings of carbs.  Check the number of grams (g) of saturated fats and trans fats in one serving. Choose foods that have   a low amount or none of these fats.  Check the number of milligrams (mg) of salt (sodium) in one serving. Most people should limit total sodium intake to less than 2,300 mg per day.  Always check the nutrition information of foods labeled as "low-fat" or "nonfat." These foods may be higher in added sugar or refined carbs and should be avoided.  Talk to your dietitian to identify your daily goals for nutrients listed on the label. Shopping  Avoid buying canned, pre-made, or processed foods. These foods tend to be high in fat, sodium, and added  sugar.  Shop around the outside edge of the grocery store. This is where you will most often find fresh fruits and vegetables, bulk grains, fresh meats, and fresh dairy. Cooking  Use low-heat cooking methods, such as baking, instead of high-heat cooking methods like deep frying.  Cook using healthy oils, such as olive, canola, or sunflower oil.  Avoid cooking with butter, cream, or high-fat meats. Meal planning  Eat meals and snacks regularly, preferably at the same times every day. Avoid going long periods of time without eating.  Eat foods that are high in fiber, such as fresh fruits, vegetables, beans, and whole grains. Talk with your dietitian about how many servings of carbs you can eat at each meal.  Eat 4-6 oz (112-168 g) of lean protein each day, such as lean meat, chicken, fish, eggs, or tofu. One ounce (oz) of lean protein is equal to: ? 1 oz (28 g) of meat, chicken, or fish. ? 1 egg. ?  cup (62 g) of tofu.  Eat some foods each day that contain healthy fats, such as avocado, nuts, seeds, and fish.   What foods should I eat? Fruits Berries. Apples. Oranges. Peaches. Apricots. Plums. Grapes. Mango. Papaya. Pomegranate. Kiwi. Cherries. Vegetables Lettuce. Spinach. Leafy greens, including kale, chard, collard greens, and mustard greens. Beets. Cauliflower. Cabbage. Broccoli. Carrots. Green beans. Tomatoes. Peppers. Onions. Cucumbers. Brussels sprouts. Grains Whole grains, such as whole-wheat or whole-grain bread, crackers, tortillas, cereal, and pasta. Unsweetened oatmeal. Quinoa. Brown or wild rice. Meats and other proteins Seafood. Poultry without skin. Lean cuts of poultry and beef. Tofu. Nuts. Seeds. Dairy Low-fat or fat-free dairy products such as milk, yogurt, and cheese. The items listed above may not be a complete list of foods and beverages you can eat. Contact a dietitian for more information. What foods should I avoid? Fruits Fruits canned with  syrup. Vegetables Canned vegetables. Frozen vegetables with butter or cream sauce. Grains Refined white flour and flour products such as bread, pasta, snack foods, and cereals. Avoid all processed foods. Meats and other proteins Fatty cuts of meat. Poultry with skin. Breaded or fried meats. Processed meat. Avoid saturated fats. Dairy Full-fat yogurt, cheese, or milk. Beverages Sweetened drinks, such as soda or iced tea. The items listed above may not be a complete list of foods and beverages you should avoid. Contact a dietitian for more information. Questions to ask a health care provider  Do I need to meet with a diabetes educator?  Do I need to meet with a dietitian?  What number can I call if I have questions?  When are the best times to check my blood glucose? Where to find more information:  American Diabetes Association: diabetes.org  Academy of Nutrition and Dietetics: www.eatright.org  National Institute of Diabetes and Digestive and Kidney Diseases: www.niddk.nih.gov  Association of Diabetes Care and Education Specialists: www.diabeteseducator.org Summary  It is important to have healthy eating   habits because your blood sugar (glucose) levels are greatly affected by what you eat and drink.  A healthy meal plan will help you control your blood glucose and maintain a healthy lifestyle.  Your health care provider may recommend that you work with a dietitian to make a meal plan that is best for you.  Keep in mind that carbohydrates (carbs) and alcohol have immediate effects on your blood glucose levels. It is important to count carbs and to use alcohol carefully. This information is not intended to replace advice given to you by your health care provider. Make sure you discuss any questions you have with your health care provider. Document Revised: 11/08/2019 Document Reviewed: 11/08/2019 Elsevier Patient Education  2021 [employer]  

## 2021-09-03 ENCOUNTER — Other Ambulatory Visit: Payer: Self-pay

## 2021-09-03 ENCOUNTER — Ambulatory Visit (HOSPITAL_COMMUNITY)
Admission: RE | Admit: 2021-09-03 | Discharge: 2021-09-03 | Disposition: A | Discharge status: Home / Self Care | Payer: Medicare Other | Source: Ambulatory Visit | Attending: Nephrology | Admitting: Nephrology

## 2021-09-03 ENCOUNTER — Encounter: Payer: Self-pay | Admitting: Nurse Practitioner

## 2021-09-03 ENCOUNTER — Ambulatory Visit (INDEPENDENT_AMBULATORY_CARE_PROVIDER_SITE_OTHER): Payer: Medicare Other | Admitting: Nurse Practitioner

## 2021-09-03 VITALS — BP 106/62 | HR 88 | Ht 59.0 in | Wt 150.2 lb

## 2021-09-03 DIAGNOSIS — E1121 Type 2 diabetes mellitus with diabetic nephropathy: Secondary | ICD-10-CM | POA: Diagnosis not present

## 2021-09-03 DIAGNOSIS — Z905 Acquired absence of kidney: Secondary | ICD-10-CM

## 2021-09-03 DIAGNOSIS — E1122 Type 2 diabetes mellitus with diabetic chronic kidney disease: Secondary | ICD-10-CM | POA: Insufficient documentation

## 2021-09-03 DIAGNOSIS — I10 Essential (primary) hypertension: Secondary | ICD-10-CM | POA: Diagnosis not present

## 2021-09-03 DIAGNOSIS — E559 Vitamin D deficiency, unspecified: Secondary | ICD-10-CM

## 2021-09-03 DIAGNOSIS — D638 Anemia in other chronic diseases classified elsewhere: Secondary | ICD-10-CM | POA: Insufficient documentation

## 2021-09-03 DIAGNOSIS — E782 Mixed hyperlipidemia: Secondary | ICD-10-CM

## 2021-09-03 DIAGNOSIS — N189 Chronic kidney disease, unspecified: Secondary | ICD-10-CM | POA: Diagnosis not present

## 2021-09-03 DIAGNOSIS — E872 Acidosis, unspecified: Secondary | ICD-10-CM

## 2021-09-03 DIAGNOSIS — I129 Hypertensive chronic kidney disease with stage 1 through stage 4 chronic kidney disease, or unspecified chronic kidney disease: Secondary | ICD-10-CM | POA: Diagnosis not present

## 2021-09-03 DIAGNOSIS — D519 Vitamin B12 deficiency anemia, unspecified: Secondary | ICD-10-CM | POA: Diagnosis not present

## 2021-09-03 DIAGNOSIS — T452X1A Poisoning by vitamins, accidental (unintentional), initial encounter: Secondary | ICD-10-CM | POA: Diagnosis not present

## 2021-09-03 NOTE — Progress Notes (Signed)
Endocrinology Follow Up Note       09/03/2021, 2:45 PM   Subjective:    Patient ID: [name] [name], female    DOB: Dec 31, 1940.  [name] [name] is being seen in follow up after being seen in consultation for management of currently uncontrolled symptomatic diabetes requested by  [name], NP.   Past Medical History:  Diagnosis Date   Anxiety    CAD (coronary artery disease)    Multivessel status post CABG June 2021 - LIMA to LAD, SVG to distal RCA, SVG to OM1   Chronic obstructive pulmonary disease, unspecified ([place])    Depression    Essential hypertension    GERD    History of pneumonia    Incarcerated ventral hernia    Osteoarthritis    Type 2 diabetes mellitus ([place])     Past Surgical History:  Procedure Laterality Date   CORONARY ARTERY BYPASS GRAFT N/A 05/22/2020   Procedure: CORONARY ARTERY BYPASS GRAFTING (CABG) x3 LIMA TO LAD, SVG TO OM1, SVG TO DISTAL RIGHT;  Surgeon: Lajuana Matte, MD;  Location: [facility];  Service: Open Heart Surgery;  Laterality: N/A;   LEFT HEART CATH AND CORONARY ANGIOGRAPHY N/A 05/21/2020   Procedure: LEFT HEART CATH AND CORONARY ANGIOGRAPHY;  Surgeon: Martinique, Peter M, MD;  Location: [facility];  Service: Cardiovascular;  Laterality: N/A;   NEPHRECTOMY     TEE WITHOUT CARDIOVERSION N/A 05/22/2020   Procedure: TRANSESOPHAGEAL ECHOCARDIOGRAM (TEE);  Surgeon: Lajuana Matte, MD;  Location: [facility];  Service: Open Heart Surgery;  Laterality: N/A;   VEIN HARVEST Right 05/22/2020   Procedure: Open Vein Harvest;  Surgeon: Lajuana Matte, MD;  Location: [facility];  Service: Open Heart Surgery;  Laterality: Right;   VENTRAL HERNIA REPAIR  09/01/2017   mistaken entry    Social History   Socioeconomic History   Marital status: Widowed    Spouse name: Not on file   Number of children: 1   Years of education: 53   Highest education level: 12th grade  Occupational  History   Occupation: Retired  Tobacco Use   Smoking status: Never   Smokeless tobacco: Never  Vaping Use   Vaping Use: Never used  Substance and Sexual Activity   Alcohol use: No   Drug use: No   Sexual activity: Not Currently  Other Topics Concern   Not on file  Social History Narrative   Lives alone.  Has 1 daughter      Enjoys reading and cooking      Eats all food groups   Caffeine, some coffee   Water reports drinking pretty well throughout the day      Social Determinants of Health   Financial Resource Strain: Low Risk    Difficulty of Paying Living Expenses: Not hard at all  Food Insecurity: No Food Insecurity   Worried About Charity fundraiser in the Last Year: Never true   Arboriculturist in the Last Year: Never true  Transportation Needs: No Transportation Needs   Lack of Transportation (Medical): No   Lack of Transportation (Non-Medical): No  Physical Activity: Inactive   Days of Exercise  per Week: 0 days   Minutes of Exercise per Session: 0 min  Stress: No Stress Concern Present   Feeling of Stress : Only a little  Social Connections: Socially Isolated   Frequency of Communication with Friends and Family: Twice a week   Frequency of Social Gatherings with Friends and Family: Once a week   Attends Religious Services: Never   Marine scientist or Organizations: No   Attends Archivist Meetings: Never   Marital Status: Widowed    Family History  Problem Relation Age of Onset   CAD Father    Heart failure Sister     Outpatient Encounter Medications as of 09/03/2021  Medication Sig   Accu-Chek Softclix Lancets lancets Use as instructed   aspirin EC 81 MG tablet Take 1 tablet (81 mg total) by mouth daily. Swallow whole.   atorvastatin (LIPITOR) 40 MG tablet TAKE 1 TABLET BY MOUTH AT BEDTIME.   Biotin w/ Vitamins C & E (HAIR SKIN & NAILS GUMMIES PO) Take 2 each by mouth every morning.   blood glucose meter kit and supplies Dispense based  on patient and insurance preference. Use up to four times daily as directed. (FOR ICD-10 E10.9, E11.9).   cetirizine (ZYRTEC) 10 MG tablet Take 10 mg by mouth daily as needed (allergies).   famotidine (PEPCID) 20 MG tablet Take 1 tablet (20 mg total) by mouth daily.   fish oil-omega-3 fatty acids 1000 MG capsule Take 1 g by mouth 2 (two) times daily.   glipiZIDE (GLUCOTROL XL) 5 MG 24 hr tablet Take 1 tablet (5 mg total) by mouth daily with breakfast.   glucose blood (ACCU-CHEK GUIDE) test strip Use as instructed to monitor glucose twice daily   glucose blood test strip Use as instructed to monitor glucose twice daily   linagliptin (TRADJENTA) 5 MG TABS tablet Take 1 tablet (5 mg total) by mouth daily.   metFORMIN (GLUCOPHAGE) 500 MG tablet Take 1 tablet (500 mg total) by mouth 2 (two) times daily with a meal.   Multiple Vitamin (MULITIVITAMIN WITH MINERALS) TABS Take 1 tablet by mouth daily.   nitroGLYCERIN (NITROSTAT) 0.4 MG SL tablet Place under the tongue.   polyethylene glycol (MIRALAX / GLYCOLAX) 17 g packet Take 17 g by mouth daily as needed for mild constipation or moderate constipation.   nitroGLYCERIN (NITROSTAT) 0.4 MG SL tablet Place 1 tablet (0.4 mg total) under the tongue every 5 (five) minutes x 3 doses as needed for chest pain (if no relief after 3rd dose, proceed to the ED for an evaluation or call 911).   [DISCONTINUED] metoprolol tartrate (LOPRESSOR) 25 MG tablet Take 0.5 tablets (12.5 mg total) by mouth 2 (two) times daily.   No facility-administered encounter medications on file as of 09/03/2021.    ALLERGIES: Allergies  Allergen Reactions   Iodinated Diagnostic Agents Other (See Comments)    Unknown reaction. Due to kidney issues. Do not use Unknown reaction. Due to kidney issues. Do not use   Codeine Rash   Morphine And Related Other (See Comments) and Rash    Unknown Reaction Unknown Reaction   Other     Pt has multiple allergies per pt but unsure of all.     Penicillins Other (See Comments)    Unknown reaction Unknown reaction    VACCINATION STATUS: Immunization History  Administered Date(s) Administered   Influenza, High Dose Seasonal PF 11/12/2017, 08/13/2018   Influenza-Unspecified 11/12/2011, 11/03/2012, 11/02/2013, 09/20/2014, 09/19/2015, 09/23/2016, 08/18/2019   Pneumococcal  Conjugate-13 09/20/2014   Pneumococcal Polysaccharide-23 03/18/2016    Diabetes She presents for her follow-up diabetic visit. She has type 2 diabetes mellitus. Onset time: Was diagnosed at approx age of 34. Her disease course has been improving. Hypoglycemia symptoms include sweats and tremors. Associated symptoms include blurred vision and foot paresthesias. Pertinent negatives for diabetes include no fatigue, no polydipsia and no polyuria. There are no hypoglycemic complications. Symptoms are stable. Diabetic complications include heart disease, nephropathy and peripheral neuropathy. Risk factors for coronary artery disease include diabetes mellitus, dyslipidemia, hypertension, stress, tobacco exposure, sedentary lifestyle and post-menopausal. Current diabetic treatment includes oral agent (triple therapy). She is compliant with treatment most of the time. Her weight is stable. She is following a generally healthy diet. When asked about meal planning, she reported none. She has not had a previous visit with a dietitian. She rarely participates in exercise. Her home blood glucose trend is decreasing steadily. Her breakfast blood glucose range is generally 90-110 mg/dl. Her bedtime blood glucose range is generally 110-130 mg/dl. (She presents today, accompanied by her daughter, with her meter and logs showing at goal fasting and postprandial glycemic profile.  Her most recent A1c from 7/17 was 6.2%, improving from last visit of 7.8%.  She has reluctantly accepted that she needs to be followed by a nephrologist given her history of a previous nephrectomy and recently saw them  last week (preliminary studies still under way).  She is doing very well with her diet but reports she has no appetite anymore.  She is frustrated with me because I wouldn't see her at her last appointment when she showed up 20+ minutes late for her visit.  She did have rare episodes of hypoglycemia noted, likely due to meal timing or quantity.) An ACE inhibitor/angiotensin II receptor blocker is not being taken. She does not see a podiatrist.Eye exam is current.  Hyperlipidemia This is a chronic problem. The current episode started more than 1 year ago. The problem is uncontrolled. Recent lipid tests were reviewed and are variable. Exacerbating diseases include chronic renal disease and diabetes. Factors aggravating her hyperlipidemia include beta blockers and fatty foods. Current antihyperlipidemic treatment includes statins. The current treatment provides mild improvement of lipids. Compliance problems include adherence to diet and adherence to exercise.  Risk factors for coronary artery disease include diabetes mellitus, dyslipidemia, hypertension, post-menopausal, a sedentary lifestyle and stress.  Hypertension This is a chronic problem. The current episode started more than 1 year ago. The problem has been waxing and waning since onset. The problem is controlled. Associated symptoms include blurred vision and sweats. There are no associated agents to hypertension. Risk factors for coronary artery disease include diabetes mellitus, dyslipidemia, sedentary lifestyle, smoking/tobacco exposure, stress and post-menopausal state. Past treatments include beta blockers. The current treatment provides mild improvement. There are no compliance problems.  Hypertensive end-organ damage includes kidney disease and CAD/MI. Identifiable causes of hypertension include chronic renal disease.    Review of systems  Constitutional: + Minimally fluctuating body weight,  current Body mass index is 30.34 kg/m. , + fatigue  (improving), no subjective hyperthermia, no subjective hypothermia Eyes: + blurry vision, no xerophthalmia ENT: no sore throat, no nodules palpated in throat, no dysphagia/odynophagia, no hoarseness Cardiovascular: no chest pain, no shortness of breath, no palpitations, +intermittent leg swelling Respiratory: no cough, no shortness of breath Gastrointestinal: no nausea/vomiting/diarrhea- c/o bloating after meals Musculoskeletal: no muscle/joint aches Skin: no rashes, no hyperemia Neurological: no tremors, no numbness, no tingling, no dizziness Psychiatric: no  depression, no anxiety  Objective:     BP 106/62   Pulse 88   Ht _0  (1.499 m)   Wt 150 lb 3.2 oz (68.1 kg)   BMI 30.34 kg/m   Wt Readings from Last 3 Encounters:  09/03/21 150 lb 3.2 oz (68.1 kg)  08/21/21 150 lb (68 kg)  07/17/21 152 lb (68.9 kg)     BP Readings from Last 3 Encounters:  09/03/21 106/62  08/21/21 (!) 102/57  07/17/21 (!) 117/54      Physical Exam- Limited  Constitutional:  Body mass index is 30.34 kg/m. , not in acute distress, anxious state of mind Eyes:  EOMI, no exophthalmos Neck: Supple Cardiovascular: RRR, no murmurs, rubs, or gallops, no edema Respiratory: Adequate breathing efforts, no crackles, rales, rhonchi, or wheezing Musculoskeletal: she does have abnormal bulge in LLQ of abdomen (unknown etiology), strength intact in all four extremities, no gross restriction of joint movements Skin:  no rashes, no hyperemia Neurological: no tremor with outstretched hands   CMP ( most recent) CMP     Component Value Date/Time   NA 141 08/22/2021 1022   K 4.8 08/22/2021 1022   CL 105 08/22/2021 1022   CO2 18 (L) 08/22/2021 1022   GLUCOSE 109 (H) 08/22/2021 1022   GLUCOSE 167 (H) 07/02/2021 0328   BUN 22 08/22/2021 1022   CREATININE 1.22 (H) 08/22/2021 1022   CREATININE 1.28 (H) 03/07/2020 1137   CALCIUM 8.9 08/22/2021 1022   PROT 6.7 08/22/2021 1022   ALBUMIN 4.6 08/22/2021 1022    AST 20 08/22/2021 1022   ALT 26 08/22/2021 1022   ALKPHOS 61 08/22/2021 1022   BILITOT 0.4 08/22/2021 1022   GFRNONAA 35 (L) 07/02/2021 0328   GFRNONAA 40 (L) 03/07/2020 1137   GFRAA 48 (L) 12/28/2020 0955   GFRAA 46 (L) 03/07/2020 1137     Diabetic Labs (most recent): Lab Results  Component Value Date   HGBA1C 6.2 (H) 06/30/2021   HGBA1C 7.8 (A) 02/27/2021   HGBA1C 11.5 (A) 11/27/2020     Lipid Panel ( most recent) Lipid Panel     Component Value Date/Time   CHOL 102 08/22/2021 1022   TRIG 128 08/22/2021 1022   HDL 32 (L) 08/22/2021 1022   CHOLHDL 3.2 08/22/2021 1022   CHOLHDL 2.9 06/28/2021 1041   VLDL 34 06/28/2021 1041   LDLCALC 47 08/22/2021 1022   LDLCALC 109 (H) 03/07/2020 1137   LABVLDL 23 08/22/2021 1022      Lab Results  Component Value Date   TSH 3.040 08/22/2021   TSH 2.02 11/17/2019   FREET4 1.25 08/22/2021           Assessment & Plan:   1) Uncontrolled Type 2 Diabetes with CKD stage 3  - [name] [name] has currently uncontrolled symptomatic type 2 DM since 80 years of age.  She presents today, accompanied by her daughter, with her meter and logs showing at goal fasting and postprandial glycemic profile.  Her most recent A1c from 7/17 was 6.2%, improving from last visit of 7.8%.  She has reluctantly accepted that she needs to be followed by a nephrologist given her history of a previous nephrectomy and recently saw them last week (preliminary studies still under way).  She is doing very well with her diet but reports she has no appetite anymore.  She is frustrated with me because I wouldn't see her at her last appointment when she showed up 20+ minutes late for her visit.  She did have rare episodes of hypoglycemia noted, likely due to meal timing or quantity.  -Recent labs reviewed.  - I had a long discussion with her about the progressive nature of diabetes and the pathology behind its complications. -her diabetes is complicated by CAD and she  remains at a high risk for more acute and chronic complications which include CAD, CVA, CKD, retinopathy, and neuropathy. These are all discussed in detail with her.  - Nutritional counseling repeated at each appointment due to patients tendency to fall back in to old habits.  - The patient admits there is a room for improvement in their diet and drink choices. -  Suggestion is made for the patient to avoid simple carbohydrates from their diet including Cakes, Sweet Desserts / Pastries, Ice Cream, Soda (diet and regular), Sweet Tea, Candies, Chips, Cookies, Sweet Pastries, Store Bought Juices, Alcohol in Excess of 1-2 drinks a day, Artificial Sweeteners, Coffee Creamer, and "Sugar-free" Products. This will help patient to have stable blood glucose profile and potentially avoid unintended weight gain.   - I encouraged the patient to switch to unprocessed or minimally processed complex starch and increased protein intake (animal or plant source), fruits, and vegetables.   - Patient is advised to stick to a routine mealtimes to eat 3 meals a day and avoid unnecessary snacks (to snack only to correct hypoglycemia).  - she will be scheduled with Jearld Fenton, RDN, CDE for diabetes education.  - I have approached her with the following individualized plan to manage  her diabetes and patient agrees:   -She is advised to continue her medications Metformin 500 mg po twice daily with meals (as long as nephrology approves after their initial work up), Glipizide 5 mg XL daily with breakfast, and Tradjenta 5 mg po daily given her positive response to oral medications only.  -she is encouraged to continue monitoring blood glucose twice daily, before breakfast and before bed, and to call the clinic if she has readings less than 70 or greater than 200 for 3 day out of the week.  - Specific targets for  A1c;  LDL, HDL,  and Triglycerides were discussed with the patient.  2) Blood Pressure /Hypertension:  her  blood pressure is controlled to target.   she is not currently on any antihypertensive medications at this time.  3) Lipids/Hyperlipidemia:    Review of her recent lipid panel from 08/22/21 showed controlled  LDL at 47.  she  is advised to continue Lipitor 40 mg daily at bedtime and Fish Oil supplement.  Side effects and precautions discussed with her.    4)  Weight/Diet:  her Body mass index is 30.34 kg/m.  -  clearly complicating her diabetes care.   she is a candidate for weight loss. I discussed with her the fact that loss of 5 - 10% of her  current body weight will have the most impact on her diabetes management.  Exercise, and detailed carbohydrates information provided  -  detailed on discharge instructions.  5) Hypervitaminosis of vitamin D Her most recent vitamin D level was high at 113.  She was taking several supplements that contained vitamin D in it.  The nephrologist asked her to stop all vitamin D supplements at this time.  6) Chronic Care/Health Maintenance: -she is on Statin medications and is encouraged to initiate and continue to follow up with Ophthalmology, Dentist, Podiatrist at least yearly or according to recommendations, and advised to stay away from smoking. I have recommended  yearly flu vaccine and pneumonia vaccine at least every 5 years; moderate intensity exercise for up to 150 minutes weekly; and sleep for at least 7 hours a day.  - she is advised to maintain close follow up with [name], NP for primary care needs, as well as her other providers for optimal and coordinated care.      I spent 40 minutes in the care of the patient today including review of labs from [place], Lipids, Thyroid Function, Hematology (current and previous including abstractions from other facilities); face-to-face time discussing  her blood glucose readings/logs, discussing hypoglycemia and hyperglycemia episodes and symptoms, medications doses, her options of short and long term  treatment based on the latest standards of care / guidelines;  discussion about incorporating lifestyle medicine;  and documenting the encounter.    Please refer to Patient Instructions for Blood Glucose Monitoring and Insulin/Medications Dosing Guide"  in media tab for additional information. Please  also refer to " Patient Self Inventory" in the Media  tab for reviewed elements of pertinent patient history.  [name] [name] participated in the discussions, expressed understanding, and voiced agreement with the above plans.  All questions were answered to her satisfaction. she is encouraged to contact clinic should she have any questions or concerns prior to her return visit.   Follow up plan: - Return in about 4 months (around 01/03/2022) for Diabetes F/U with A1c in office, No previsit labs, Bring meter and logs.  Rayetta Pigg, [facility] [address] [place], [place] [address] Phone: [phone] Fax: [phone]  09/03/2021, 2:45 PM

## 2021-09-05 ENCOUNTER — Other Ambulatory Visit: Payer: Self-pay

## 2021-09-05 ENCOUNTER — Telehealth: Payer: Self-pay

## 2021-09-05 MED ORDER — CETIRIZINE HCL 10 MG PO TABS
10.0000 mg | ORAL_TABLET | Freq: Every day | ORAL | 3 refills | Status: DC | PRN
Start: 2021-09-05 — End: 2022-05-20

## 2021-09-05 NOTE — Telephone Encounter (Signed)
Patient called need med refill cetirizine (ZYRTEC) 10 MG tablet  Pharmacy: [facility]

## 2021-09-05 NOTE — Telephone Encounter (Signed)
Rx sent 

## 2021-09-09 DIAGNOSIS — I6930 Unspecified sequelae of cerebral infarction: Secondary | ICD-10-CM | POA: Diagnosis not present

## 2021-09-09 DIAGNOSIS — R410 Disorientation, unspecified: Secondary | ICD-10-CM | POA: Diagnosis not present

## 2021-09-09 DIAGNOSIS — I2510 Atherosclerotic heart disease of native coronary artery without angina pectoris: Secondary | ICD-10-CM | POA: Diagnosis not present

## 2021-09-26 ENCOUNTER — Other Ambulatory Visit: Payer: Self-pay

## 2021-09-26 ENCOUNTER — Ambulatory Visit: Payer: Medicare Other

## 2021-09-26 DIAGNOSIS — Z20822 Contact with and (suspected) exposure to covid-19: Secondary | ICD-10-CM | POA: Diagnosis not present

## 2021-09-28 LAB — SARS-COV-2, NAA 2 DAY TAT

## 2021-09-28 LAB — NOVEL CORONAVIRUS, NAA: SARS-CoV-2, NAA: NOT DETECTED

## 2021-09-30 ENCOUNTER — Other Ambulatory Visit: Payer: Self-pay | Admitting: Nurse Practitioner

## 2021-09-30 ENCOUNTER — Telehealth: Payer: Self-pay | Admitting: Nurse Practitioner

## 2021-09-30 DIAGNOSIS — E1121 Type 2 diabetes mellitus with diabetic nephropathy: Secondary | ICD-10-CM

## 2021-09-30 NOTE — Telephone Encounter (Signed)
Pt called in for covid results  

## 2021-10-01 ENCOUNTER — Telehealth: Payer: Self-pay

## 2021-10-01 NOTE — Telephone Encounter (Signed)
Pt daughter aware

## 2021-10-01 NOTE — Telephone Encounter (Signed)
Spoke with pt daughter she states understanding that the results are negative

## 2021-10-01 NOTE — Telephone Encounter (Signed)
Patient called about covid lab results, please contact patient back at [phone].

## 2021-10-03 DIAGNOSIS — I129 Hypertensive chronic kidney disease with stage 1 through stage 4 chronic kidney disease, or unspecified chronic kidney disease: Secondary | ICD-10-CM | POA: Diagnosis not present

## 2021-10-03 DIAGNOSIS — Z905 Acquired absence of kidney: Secondary | ICD-10-CM | POA: Diagnosis not present

## 2021-10-03 DIAGNOSIS — E1122 Type 2 diabetes mellitus with diabetic chronic kidney disease: Secondary | ICD-10-CM | POA: Diagnosis not present

## 2021-10-03 DIAGNOSIS — E8722 Chronic metabolic acidosis: Secondary | ICD-10-CM | POA: Diagnosis not present

## 2021-10-03 DIAGNOSIS — N189 Chronic kidney disease, unspecified: Secondary | ICD-10-CM | POA: Diagnosis not present

## 2021-10-03 DIAGNOSIS — T452X1D Poisoning by vitamins, accidental (unintentional), subsequent encounter: Secondary | ICD-10-CM | POA: Diagnosis not present

## 2021-10-03 DIAGNOSIS — N281 Cyst of kidney, acquired: Secondary | ICD-10-CM | POA: Diagnosis not present

## 2021-10-18 ENCOUNTER — Ambulatory Visit: Payer: Medicare Other | Admitting: Neurology

## 2021-10-30 ENCOUNTER — Other Ambulatory Visit: Payer: Self-pay | Admitting: Nurse Practitioner

## 2021-10-30 DIAGNOSIS — K2100 Gastro-esophageal reflux disease with esophagitis, without bleeding: Secondary | ICD-10-CM

## 2021-11-01 ENCOUNTER — Telehealth: Payer: Self-pay | Admitting: Nurse Practitioner

## 2021-11-01 ENCOUNTER — Other Ambulatory Visit: Payer: Self-pay | Admitting: *Deleted

## 2021-11-01 DIAGNOSIS — K2100 Gastro-esophageal reflux disease with esophagitis, without bleeding: Secondary | ICD-10-CM

## 2021-11-01 MED ORDER — FAMOTIDINE 20 MG PO TABS: 20.0000 mg | ORAL_TABLET | Freq: Every day | ORAL | 1 refills | Status: DC

## 2021-11-01 NOTE — Telephone Encounter (Signed)
Rx has been sent in to pharmacy. 

## 2021-11-01 NOTE — Telephone Encounter (Signed)
Pharm called in for refill on   famotidine (PEPCID) 20 MG tablet   Pharm states they have faxed over paper

## 2021-11-21 DIAGNOSIS — H524 Presbyopia: Secondary | ICD-10-CM | POA: Diagnosis not present

## 2021-11-21 DIAGNOSIS — E119 Type 2 diabetes mellitus without complications: Secondary | ICD-10-CM | POA: Diagnosis not present

## 2021-11-21 DIAGNOSIS — Z7984 Long term (current) use of oral hypoglycemic drugs: Secondary | ICD-10-CM | POA: Diagnosis not present

## 2021-11-21 DIAGNOSIS — H52223 Regular astigmatism, bilateral: Secondary | ICD-10-CM | POA: Diagnosis not present

## 2021-11-21 DIAGNOSIS — I119 Hypertensive heart disease without heart failure: Secondary | ICD-10-CM | POA: Diagnosis not present

## 2021-11-21 DIAGNOSIS — N1832 Chronic kidney disease, stage 3b: Secondary | ICD-10-CM | POA: Diagnosis not present

## 2021-11-21 DIAGNOSIS — E782 Mixed hyperlipidemia: Secondary | ICD-10-CM | POA: Diagnosis not present

## 2021-11-21 DIAGNOSIS — H25813 Combined forms of age-related cataract, bilateral: Secondary | ICD-10-CM | POA: Diagnosis not present

## 2021-11-21 DIAGNOSIS — H5203 Hypermetropia, bilateral: Secondary | ICD-10-CM | POA: Diagnosis not present

## 2021-11-21 LAB — HM DIABETES EYE EXAM

## 2021-11-22 LAB — CMP14+EGFR
ALT: 30 IU/L (ref 0–32)
AST: 23 IU/L (ref 0–40)
Albumin/Globulin Ratio: 2 (ref 1.2–2.2)
Albumin: 4.5 g/dL (ref 3.7–4.7)
Alkaline Phosphatase: 69 IU/L (ref 44–121)
BUN/Creatinine Ratio: 20 (ref 12–28)
BUN: 27 mg/dL (ref 8–27)
Bilirubin Total: 0.3 mg/dL (ref 0.0–1.2)
CO2: 21 mmol/L (ref 20–29)
Calcium: 9.3 mg/dL (ref 8.7–10.3)
Chloride: 103 mmol/L (ref 96–106)
Creatinine, Ser: 1.38 mg/dL — ABNORMAL HIGH (ref 0.57–1.00)
Globulin, Total: 2.3 g/dL (ref 1.5–4.5)
Glucose: 110 mg/dL — ABNORMAL HIGH (ref 70–99)
Potassium: 5.1 mmol/L (ref 3.5–5.2)
Sodium: 141 mmol/L (ref 134–144)
Total Protein: 6.8 g/dL (ref 6.0–8.5)
eGFR: 39 mL/min/{1.73_m2} — ABNORMAL LOW (ref >59)

## 2021-11-22 LAB — CBC WITH DIFFERENTIAL/PLATELET
Basophils Absolute: 0 10*3/uL (ref 0.0–0.2)
Basos: 1 %
EOS (ABSOLUTE): 0.2 10*3/uL (ref 0.0–0.4)
Eos: 2 %
Hematocrit: 37.1 % (ref 34.0–46.6)
Hemoglobin: 12.5 g/dL (ref 11.1–15.9)
Immature Grans (Abs): 0 10*3/uL (ref 0.0–0.1)
Immature Granulocytes: 0 %
Lymphocytes Absolute: 2.1 10*3/uL (ref 0.7–3.1)
Lymphs: 27 %
MCH: 31.1 pg (ref 26.6–33.0)
MCHC: 33.7 g/dL (ref 31.5–35.7)
MCV: 92 fL (ref 79–97)
Monocytes Absolute: 0.5 10*3/uL (ref 0.1–0.9)
Monocytes: 6 %
Neutrophils Absolute: 5 10*3/uL (ref 1.4–7.0)
Neutrophils: 64 %
Platelets: 263 10*3/uL (ref 150–450)
RBC: 4.02 x10E6/uL (ref 3.77–5.28)
RDW: 12 % (ref 11.7–15.4)
WBC: 7.9 10*3/uL (ref 3.4–10.8)

## 2021-11-22 LAB — LIPID PANEL WITH LDL/HDL RATIO
Cholesterol, Total: 121 mg/dL (ref 100–199)
HDL: 39 mg/dL — ABNORMAL LOW (ref >39)
LDL Chol Calc (NIH): 55 mg/dL (ref 0–99)
LDL/HDL Ratio: 1.4 ratio (ref 0.0–3.2)
Triglycerides: 157 mg/dL — ABNORMAL HIGH (ref 0–149)
VLDL Cholesterol Cal: 27 mg/dL (ref 5–40)

## 2021-11-22 NOTE — Progress Notes (Signed)
Labs are stable, and I will see her on 12/14.

## 2021-11-27 ENCOUNTER — Other Ambulatory Visit: Payer: Self-pay | Admitting: Nurse Practitioner

## 2021-11-27 ENCOUNTER — Other Ambulatory Visit: Payer: Self-pay

## 2021-11-27 ENCOUNTER — Encounter: Payer: Self-pay | Admitting: Nurse Practitioner

## 2021-11-27 ENCOUNTER — Telehealth: Payer: Self-pay | Admitting: Nurse Practitioner

## 2021-11-27 ENCOUNTER — Ambulatory Visit (INDEPENDENT_AMBULATORY_CARE_PROVIDER_SITE_OTHER): Payer: Medicare Other | Admitting: Nurse Practitioner

## 2021-11-27 VITALS — BP 128/85 | HR 84 | Ht 59.0 in | Wt 151.0 lb

## 2021-11-27 DIAGNOSIS — K2100 Gastro-esophageal reflux disease with esophagitis, without bleeding: Secondary | ICD-10-CM

## 2021-11-27 DIAGNOSIS — M79644 Pain in right finger(s): Secondary | ICD-10-CM | POA: Diagnosis not present

## 2021-11-27 DIAGNOSIS — I119 Hypertensive heart disease without heart failure: Secondary | ICD-10-CM

## 2021-11-27 DIAGNOSIS — E1121 Type 2 diabetes mellitus with diabetic nephropathy: Secondary | ICD-10-CM

## 2021-11-27 MED ORDER — FAMOTIDINE 20 MG PO TABS: 20.0000 mg | ORAL_TABLET | Freq: Every day | ORAL | 1 refills | Status: DC

## 2021-11-27 NOTE — Progress Notes (Signed)
Acute Office Visit  Subjective:    Patient ID: [name] [name], female    DOB: Dec 20, 1940, 80 y.o.   MRN: [id number]  Chief Complaint  Patient presents with   Follow-up    Follow up thumb pain      HPI Patient is in today for lab f/u for HTN and HLD.   She is followed by Endo for DM, nephrology for CKD, neuro for confusion and old CVA.  She had left thumb swelling that started a few weeks ago, but that has improved.  She would like DMV forms filled out. Past Medical History:  Diagnosis Date   Anxiety    CAD (coronary artery disease)    Multivessel status post CABG June 2021 - LIMA to LAD, SVG to distal RCA, SVG to OM1   Chronic obstructive pulmonary disease, unspecified ([place])    Depression    Essential hypertension    GERD    History of pneumonia    Incarcerated ventral hernia    Osteoarthritis    Type 2 diabetes mellitus ([place])     Past Surgical History:  Procedure Laterality Date   CORONARY ARTERY BYPASS GRAFT N/A 05/22/2020   Procedure: CORONARY ARTERY BYPASS GRAFTING (CABG) x3 LIMA TO LAD, SVG TO OM1, SVG TO DISTAL RIGHT;  Surgeon: Lajuana Matte, MD;  Location: [facility];  Service: Open Heart Surgery;  Laterality: N/A;   LEFT HEART CATH AND CORONARY ANGIOGRAPHY N/A 05/21/2020   Procedure: LEFT HEART CATH AND CORONARY ANGIOGRAPHY;  Surgeon: Martinique, Peter M, MD;  Location: [facility];  Service: Cardiovascular;  Laterality: N/A;   NEPHRECTOMY     TEE WITHOUT CARDIOVERSION N/A 05/22/2020   Procedure: TRANSESOPHAGEAL ECHOCARDIOGRAM (TEE);  Surgeon: Lajuana Matte, MD;  Location: [facility];  Service: Open Heart Surgery;  Laterality: N/A;   VEIN HARVEST Right 05/22/2020   Procedure: Open Vein Harvest;  Surgeon: Lajuana Matte, MD;  Location: [facility];  Service: Open Heart Surgery;  Laterality: Right;   VENTRAL HERNIA REPAIR  09/01/2017   mistaken entry    Family History  Problem Relation Age of Onset   CAD Father    Heart failure Sister     Social  History   Socioeconomic History   Marital status: Widowed    Spouse name: Not on file   Number of children: 1   Years of education: 76   Highest education level: 12th grade  Occupational History   Occupation: Retired  Tobacco Use   Smoking status: Never   Smokeless tobacco: Never  Vaping Use   Vaping Use: Never used  Substance and Sexual Activity   Alcohol use: No   Drug use: No   Sexual activity: Not Currently  Other Topics Concern   Not on file  Social History Narrative   Lives alone.  Has 1 daughter      Enjoys reading and cooking      Eats all food groups   Caffeine, some coffee   Water reports drinking pretty well throughout the day      Social Determinants of Health   Financial Resource Strain: Low Risk    Difficulty of Paying Living Expenses: Not hard at all  Food Insecurity: No Food Insecurity   Worried About Charity fundraiser in the Last Year: Never true   Arboriculturist in the Last Year: Never true  Transportation Needs: No Transportation Needs   Lack of Transportation (Medical): No   Lack of Transportation (Non-Medical):  No  Physical Activity: Inactive   Days of Exercise per Week: 0 days   Minutes of Exercise per Session: 0 min  Stress: No Stress Concern Present   Feeling of Stress : Only a little  Social Connections: Socially Isolated   Frequency of Communication with Friends and Family: Twice a week   Frequency of Social Gatherings with Friends and Family: Once a week   Attends Religious Services: Never   Marine scientist or Organizations: No   Attends Archivist Meetings: Never   Marital Status: Widowed  Human resources officer Violence: Not At Risk   Fear of Current or Ex-Partner: No   Emotionally Abused: No   Physically Abused: No   Sexually Abused: No    Outpatient Medications Prior to Visit  Medication Sig Dispense Refill   Accu-Chek Softclix Lancets lancets Use as instructed 100 each 12   aspirin EC 81 MG tablet Take 1  tablet (81 mg total) by mouth daily. Swallow whole. 90 tablet 3   atorvastatin (LIPITOR) 40 MG tablet TAKE 1 TABLET BY MOUTH AT BEDTIME. 90 tablet 3   Biotin w/ Vitamins C & E (HAIR SKIN & NAILS GUMMIES PO) Take 2 each by mouth every morning.     blood glucose meter kit and supplies Dispense based on patient and insurance preference. Use up to four times daily as directed. (FOR ICD-10 E10.9, E11.9). 1 each 0   cetirizine (ZYRTEC) 10 MG tablet Take 1 tablet (10 mg total) by mouth daily as needed (allergies). 30 tablet 3   famotidine (PEPCID) 20 MG tablet Take 1 tablet (20 mg total) by mouth daily. 60 tablet 1   fish oil-omega-3 fatty acids 1000 MG capsule Take 1 g by mouth 2 (two) times daily.     glipiZIDE (GLUCOTROL XL) 5 MG 24 hr tablet TAKE 1 TABLET DAILY WITH BREAKFAST. 90 tablet 0   glucose blood (ACCU-CHEK GUIDE) test strip Use as instructed to monitor glucose twice daily 100 each 12   glucose blood test strip Use as instructed to monitor glucose twice daily 100 each 1   metFORMIN (GLUCOPHAGE) 500 MG tablet Take 1 tablet (500 mg total) by mouth 2 (two) times daily with a meal. 180 tablet 3   Multiple Vitamin (MULITIVITAMIN WITH MINERALS) TABS Take 1 tablet by mouth daily.     nitroGLYCERIN (NITROSTAT) 0.4 MG SL tablet Place under the tongue.     polyethylene glycol (MIRALAX / GLYCOLAX) 17 g packet Take 17 g by mouth daily as needed for mild constipation or moderate constipation.     TRADJENTA 5 MG TABS tablet TAKE 1 TABLET ONCE DAILY. 90 tablet 0   nitroGLYCERIN (NITROSTAT) 0.4 MG SL tablet Place 1 tablet (0.4 mg total) under the tongue every 5 (five) minutes x 3 doses as needed for chest pain (if no relief after 3rd dose, proceed to the ED for an evaluation or call 911). 25 tablet 3   No facility-administered medications prior to visit.    Allergies  Allergen Reactions   Iodinated Diagnostic Agents Other (See Comments)    Unknown reaction. Due to kidney issues. Do not use Unknown  reaction. Due to kidney issues. Do not use   Codeine Rash   Morphine And Related Other (See Comments) and Rash    Unknown Reaction Unknown Reaction   Other     Pt has multiple allergies per pt but unsure of all.    Penicillins Other (See Comments)    Unknown reaction Unknown reaction  Review of Systems  Constitutional: Negative.   Respiratory: Negative.    Cardiovascular: Negative.   Musculoskeletal:        Right thumb pain and swelling that started several weeks ago, but has since resolved  Psychiatric/Behavioral: Negative.        Objective:    Physical Exam Constitutional:      Appearance: Normal appearance.  Cardiovascular:     Rate and Rhythm: Normal rate and regular rhythm.     Pulses: Normal pulses.     Heart sounds: Normal heart sounds.  Pulmonary:     Effort: Pulmonary effort is normal.     Breath sounds: Normal breath sounds.  Musculoskeletal:        General: Swelling present. No tenderness.     Comments: Mild swelling to right thumb  Neurological:     Mental Status: She is alert.  Psychiatric:        Mood and Affect: Mood normal.        Behavior: Behavior normal.        Judgment: Judgment normal.    BP 128/85    Pulse 84    Ht 4' 11"  (1.499 m)    Wt 151 lb 0.6 oz (68.5 kg)    SpO2 98%    BMI 30.51 kg/m  Wt Readings from Last 3 Encounters:  11/27/21 151 lb 0.6 oz (68.5 kg)  09/03/21 150 lb 3.2 oz (68.1 kg)  08/21/21 150 lb (68 kg)    Health Maintenance Due  Topic Date Due   COVID-19 Vaccine (1) Never done   Zoster Vaccines- Shingrix (1 of 2) Never done   DEXA SCAN  Never done   FOOT EXAM  03/07/2021    There are no preventive care reminders to display for this patient.   Lab Results  Component Value Date   TSH 3.040 08/22/2021   Lab Results  Component Value Date   WBC 7.9 11/21/2021   HGB 12.5 11/21/2021   HCT 37.1 11/21/2021   MCV 92 11/21/2021   PLT 263 11/21/2021   Lab Results  Component Value Date   NA 141 11/21/2021   K 5.1  11/21/2021   CO2 21 11/21/2021   GLUCOSE 110 (H) 11/21/2021   BUN 27 11/21/2021   CREATININE 1.38 (H) 11/21/2021   BILITOT 0.3 11/21/2021   ALKPHOS 69 11/21/2021   AST 23 11/21/2021   ALT 30 11/21/2021   PROT 6.8 11/21/2021   ALBUMIN 4.5 11/21/2021   CALCIUM 9.3 11/21/2021   ANIONGAP 8 07/02/2021   EGFR 39 (L) 11/21/2021   Lab Results  Component Value Date   CHOL 121 11/21/2021   Lab Results  Component Value Date   HDL 39 (L) 11/21/2021   Lab Results  Component Value Date   LDLCALC 55 11/21/2021   Lab Results  Component Value Date   TRIG 157 (H) 11/21/2021   Lab Results  Component Value Date   CHOLHDL 3.2 08/22/2021   Lab Results  Component Value Date   HGBA1C 6.2 (H) 06/30/2021       Assessment & Plan:   Problem List Items Addressed This Visit       Cardiovascular and Mediastinum   Hypertensive heart disease without heart failure    BP Readings from Last 3 Encounters:  11/27/21 128/85  09/03/21 106/62  08/21/21 (!) 102/57  -well controlled today; no med changes        Endocrine   Type 2 diabetes mellitus with diabetic nephropathy ([place]) - Primary  Lab Results  Component Value Date   HGBA1C 6.2 (H) 06/30/2021  -will get A1c with next set of labs; was to be drawn in March, but pt scheduled appt early and may have been to early to have A1c drawn      Relevant Orders   CBC with Differential/Platelet   CMP14+EGFR   Lipid Panel With LDL/HDL Ratio   Hemoglobin A1c     Other   Pain of right thumb    -has swelling with frequent use; has been doing a lot of coloring and decorating for Christmas, but pt states this has improved -considered ortho consult, but pt not interested at this time        No orders of the defined types were placed in this encounter.    [name], NP

## 2021-11-27 NOTE — Assessment & Plan Note (Signed)
-  has swelling with frequent use; has been doing a lot of coloring and decorating for Christmas, but pt states this has improved -considered ortho consult, but pt not interested at this time

## 2021-11-27 NOTE — Assessment & Plan Note (Signed)
BP Readings from Last 3 Encounters:  11/27/21 128/85  09/03/21 106/62  08/21/21 (!) 102/57   -well controlled today; no med changes

## 2021-11-27 NOTE — Telephone Encounter (Signed)
Last OV 09/03/2021  Next OV 01/06/2022  Just want to make sure it is okay to continue this medication? (Nephrology)

## 2021-11-27 NOTE — Patient Instructions (Addendum)
Please have fasting labs drawn 2-3 days prior to your appointment so we can discuss the results during your office visit. ° °I will be moving to [facility] located at [address], [place], [place] [address] effective Dec 15, 2021. °If you would like to establish care with [facility] please call [phone]. °

## 2021-11-27 NOTE — Telephone Encounter (Signed)
Pt needs refills on   famotidine (PEPCID) 20 MG tablet   [facility] [place]

## 2021-11-27 NOTE — Assessment & Plan Note (Addendum)
Lab Results  Component Value Date   HGBA1C 6.2 (H) 06/30/2021   -will get A1c with next set of labs; was to be drawn in March, but pt scheduled appt early and may have been to early to have A1c drawn

## 2021-12-04 ENCOUNTER — Telehealth: Payer: Self-pay | Admitting: Nurse Practitioner

## 2021-12-04 MED ORDER — ACCU-CHEK GUIDE VI STRP: ORAL_STRIP | 12 refills | Status: AC

## 2021-12-04 NOTE — Telephone Encounter (Signed)
[facility] called and states pt is needing a refill on test strips.

## 2021-12-04 NOTE — Telephone Encounter (Signed)
done

## 2021-12-04 NOTE — Telephone Encounter (Signed)
[name] [name] has already sent these to [facility] pharmacy for the patient.

## 2021-12-15 HISTORY — PX: CATARACT EXTRACTION: SUR2

## 2021-12-23 DIAGNOSIS — Z01818 Encounter for other preprocedural examination: Secondary | ICD-10-CM | POA: Diagnosis not present

## 2021-12-23 DIAGNOSIS — H25811 Combined forms of age-related cataract, right eye: Secondary | ICD-10-CM | POA: Diagnosis not present

## 2021-12-23 DIAGNOSIS — H25812 Combined forms of age-related cataract, left eye: Secondary | ICD-10-CM | POA: Diagnosis not present

## 2021-12-23 DIAGNOSIS — E119 Type 2 diabetes mellitus without complications: Secondary | ICD-10-CM | POA: Diagnosis not present

## 2021-12-30 ENCOUNTER — Other Ambulatory Visit: Payer: Self-pay | Admitting: Nurse Practitioner

## 2021-12-30 DIAGNOSIS — E1121 Type 2 diabetes mellitus with diabetic nephropathy: Secondary | ICD-10-CM

## 2022-01-01 NOTE — Progress Notes (Signed)
Cardiology Office Note  Date: 01/02/2022   ID: [name] [name], DOB 03-18-1941, MRN [id number]  PCP:  [name], FNP  Cardiologist:  Rozann Lesches, MD Electrophysiologist:  None   Chief Complaint  Patient presents with   Cardiac follow-up    History of Present Illness: [name] [name] is an 81 y.o. female last seen in July 2022.  She is here today with her daughter for a follow-up visit.  She does not report any active angina at this time on medications.  She anticipates cataract surgery in the near future.  We discussed the fact that this is generally very low risk from a cardiac perspective.  I reviewed her current medications which are stable and outlined below.  She had lab work with her PCP in December 2022, LDL was well controlled at 55 at that time.  Past Medical History:  Diagnosis Date   Anxiety    CAD (coronary artery disease)    Multivessel status post CABG June 2021 - LIMA to LAD, SVG to distal RCA, SVG to OM1   Chronic obstructive pulmonary disease, unspecified ([place])    Depression    Essential hypertension    GERD    History of pneumonia    Incarcerated ventral hernia    Osteoarthritis    Type 2 diabetes mellitus ([place])     Past Surgical History:  Procedure Laterality Date   CORONARY ARTERY BYPASS GRAFT N/A 05/22/2020   Procedure: CORONARY ARTERY BYPASS GRAFTING (CABG) x3 LIMA TO LAD, SVG TO OM1, SVG TO DISTAL RIGHT;  Surgeon: Lajuana Matte, MD;  Location: [facility];  Service: Open Heart Surgery;  Laterality: N/A;   LEFT HEART CATH AND CORONARY ANGIOGRAPHY N/A 05/21/2020   Procedure: LEFT HEART CATH AND CORONARY ANGIOGRAPHY;  Surgeon: Martinique, Peter M, MD;  Location: [facility];  Service: Cardiovascular;  Laterality: N/A;   NEPHRECTOMY     TEE WITHOUT CARDIOVERSION N/A 05/22/2020   Procedure: TRANSESOPHAGEAL ECHOCARDIOGRAM (TEE);  Surgeon: Lajuana Matte, MD;  Location: [facility];  Service: Open Heart Surgery;  Laterality: N/A;   VEIN  HARVEST Right 05/22/2020   Procedure: Open Vein Harvest;  Surgeon: Lajuana Matte, MD;  Location: [facility];  Service: Open Heart Surgery;  Laterality: Right;   VENTRAL HERNIA REPAIR  09/01/2017   mistaken entry    Current Outpatient Medications  Medication Sig Dispense Refill   Accu-Chek Softclix Lancets lancets Use as instructed 100 each 12   aspirin EC 81 MG tablet Take 1 tablet (81 mg total) by mouth daily. Swallow whole. 90 tablet 3   atorvastatin (LIPITOR) 40 MG tablet TAKE 1 TABLET BY MOUTH AT BEDTIME. 90 tablet 3   Biotin w/ Vitamins C & E (HAIR SKIN & NAILS GUMMIES PO) Take 2 each by mouth every morning.     blood glucose meter kit and supplies Dispense based on patient and insurance preference. Use up to four times daily as directed. (FOR ICD-10 E10.9, E11.9). 1 each 0   cetirizine (ZYRTEC) 10 MG tablet Take 1 tablet (10 mg total) by mouth daily as needed (allergies). 30 tablet 3   famotidine (PEPCID) 20 MG tablet Take 1 tablet (20 mg total) by mouth daily. 60 tablet 1   fish oil-omega-3 fatty acids 1000 MG capsule Take 1 g by mouth 2 (two) times daily.     glipiZIDE (GLUCOTROL XL) 5 MG 24 hr tablet TAKE 1 TABLET DAILY WITH BREAKFAST. 90 tablet 0   glucose blood (ACCU-CHEK  GUIDE) test strip Use as instructed to monitor glucose twice daily 100 each 12   glucose blood test strip Use as instructed to monitor glucose twice daily 100 each 1   metFORMIN (GLUCOPHAGE) 500 MG tablet TAKE 1 TABLET BY MOUTH 2 TIMES A DAY WITH MEALS. 60 tablet 0   Multiple Vitamin (MULITIVITAMIN WITH MINERALS) TABS Take 1 tablet by mouth daily.     polyethylene glycol (MIRALAX / GLYCOLAX) 17 g packet Take 17 g by mouth daily as needed for mild constipation or moderate constipation.     TRADJENTA 5 MG TABS tablet TAKE 1 TABLET ONCE DAILY. 90 tablet 0   nitroGLYCERIN (NITROSTAT) 0.4 MG SL tablet Place 1 tablet (0.4 mg total) under the tongue every 5 (five) minutes x 3 doses as needed for chest pain (if no relief  after 3rd dose, proceed to the ED or call 911). 25 tablet 3   No current facility-administered medications for this visit.   Allergies:  Iodinated contrast media, Codeine, Morphine and related, Other, and Penicillins   ROS: No palpitations or syncope.  Physical Exam: VS:  BP 118/76    Pulse 62    Ht _0  (1.499 m)    Wt 151 lb 9.6 oz (68.8 kg)    SpO2 99%    BMI 30.62 kg/m , BMI Body mass index is 30.62 kg/m.  Wt Readings from Last 3 Encounters:  01/02/22 151 lb 9.6 oz (68.8 kg)  11/27/21 151 lb 0.6 oz (68.5 kg)  09/03/21 150 lb 3.2 oz (68.1 kg)    General: Patient appears comfortable at rest. HEENT: Conjunctiva and lids normal, wearing a mask. Neck: Supple, no elevated JVP or carotid bruits, no thyromegaly. Lungs: Clear to auscultation, nonlabored breathing at rest. Cardiac: Regular rate and rhythm, no S3 or significant systolic murmur, no pericardial rub. Extremities: No pitting edema.  ECG:  An ECG dated 06/30/2021 was personally reviewed today and demonstrated:  Sinus rhythm with diffuse repolarization abnormalities.  Recent Labwork: 07/02/2021: Magnesium 1.6 08/22/2021: TSH 3.040 11/21/2021: ALT 30; AST 23; BUN 27; Creatinine, Ser 1.38; Hemoglobin 12.5; Platelets 263; Potassium 5.1; Sodium 141     Component Value Date/Time   CHOL 121 11/21/2021 1115   TRIG 157 (H) 11/21/2021 1115   HDL 39 (L) 11/21/2021 1115   CHOLHDL 3.2 08/22/2021 1022   CHOLHDL 2.9 06/28/2021 1041   VLDL 34 06/28/2021 1041   LDLCALC 55 11/21/2021 1115   LDLCALC 109 (H) 03/07/2020 1137    Other Studies Reviewed Today:  Cardiac catheterization 05/21/2020: Prox LAD to Mid LAD lesion is 95% stenosed. Ost Cx to Prox Cx lesion is 100% stenosed. Prox RCA lesion is 90% stenosed. Mid RCA lesion is 100% stenosed. LV end diastolic pressure is normal.   1. Critical 3 vessel occlusive CAD.    - 95% proximal to mid LAD    -100% proximal LCx. OM1 and OM2 fill by left to left collaterals    - 100% mid RCA.  Distal vessel fills by right to right and left to right collaterals 2. Normal LVEDP   Echocardiogram 05/18/2020:  1. Left ventricular ejection fraction, by estimation, is 55 to 60%. The  left ventricle has normal function. The left ventricle has no regional  wall motion abnormalities. There is mild left ventricular hypertrophy.  Left ventricular diastolic parameters  are indeterminate.   2. Right ventricular systolic function is normal. The right ventricular  size is normal.   3. Left atrial size was mildly dilated.   4.  The mitral valve is normal in structure. Trivial mitral valve  regurgitation. No evidence of mitral stenosis.   5. The aortic valve has an indeterminant number of cusps. Aortic valve  regurgitation is not visualized. No aortic stenosis is present.   6. The inferior vena cava is normal in size with greater than 50%  respiratory variability, suggesting right atrial pressure of 3 mmHg.  Assessment and Plan:  1.  Multivessel CAD status post CABG in June 2021.  She continues to do well without active angina on medical therapy.  Continue observation on aspirin, Lipitor, and as needed nitroglycerin.  2.  Mixed hyperlipidemia, on Lipitor with recent LDL to 55.  Medication Adjustments/Labs and Tests Ordered: Current medicines are reviewed at length with the patient today.  Concerns regarding medicines are outlined above.   Tests Ordered: No orders of the defined types were placed in this encounter.   Medication Changes: Meds ordered this encounter  Medications   nitroGLYCERIN (NITROSTAT) 0.4 MG SL tablet    Sig: Place 1 tablet (0.4 mg total) under the tongue every 5 (five) minutes x 3 doses as needed for chest pain (if no relief after 3rd dose, proceed to the ED or call 911).    Dispense:  25 tablet    Refill:  3    Disposition:  Follow up  6 months.  Signed, Satira Sark, MD, [facility] 01/02/2022 11:45 AM    [facility] at [place], [place], [place] [address] Phone: [phone]; Fax: [phone]

## 2022-01-02 ENCOUNTER — Ambulatory Visit (INDEPENDENT_AMBULATORY_CARE_PROVIDER_SITE_OTHER): Payer: Medicare Other | Admitting: Cardiology

## 2022-01-02 ENCOUNTER — Ambulatory Visit: Payer: Medicare Other | Admitting: Family Medicine

## 2022-01-02 ENCOUNTER — Encounter: Payer: Self-pay | Admitting: Cardiology

## 2022-01-02 VITALS — BP 118/76 | HR 62 | Ht 59.0 in | Wt 151.6 lb

## 2022-01-02 DIAGNOSIS — I25119 Atherosclerotic heart disease of native coronary artery with unspecified angina pectoris: Secondary | ICD-10-CM

## 2022-01-02 DIAGNOSIS — E782 Mixed hyperlipidemia: Secondary | ICD-10-CM | POA: Diagnosis not present

## 2022-01-02 MED ORDER — NITROGLYCERIN 0.4 MG SL SUBL: 0.4000 mg | SUBLINGUAL_TABLET | SUBLINGUAL | 3 refills | Status: DC | PRN

## 2022-01-02 NOTE — Patient Instructions (Addendum)

## 2022-01-03 ENCOUNTER — Other Ambulatory Visit: Payer: Self-pay

## 2022-01-03 ENCOUNTER — Ambulatory Visit (INDEPENDENT_AMBULATORY_CARE_PROVIDER_SITE_OTHER): Payer: Medicare Other

## 2022-01-03 ENCOUNTER — Ambulatory Visit: Payer: Medicare Other | Admitting: Family Medicine

## 2022-01-03 DIAGNOSIS — Z0001 Encounter for general adult medical examination with abnormal findings: Secondary | ICD-10-CM

## 2022-01-03 DIAGNOSIS — Z780 Asymptomatic menopausal state: Secondary | ICD-10-CM | POA: Diagnosis not present

## 2022-01-03 DIAGNOSIS — Z0000 Encounter for general adult medical examination without abnormal findings: Secondary | ICD-10-CM

## 2022-01-03 NOTE — Progress Notes (Addendum)
Subjective:   [name] [name] is a 81 y.o. female who presents for Medicare Annual (Subsequent) preventive examination. I connected with  [name] on 01/03/22 by a audio enabled telemedicine application and verified that I am speaking with the correct person using two identifiers.  Patient Location: Home  Provider Location: Office/Clinic  I discussed the limitations of evaluation and management by telemedicine. The patient expressed understanding and agreed to proceed.  Review of Systems    Defer to PCP Cardiac Risk Factors include: advanced age (>45mn, >>32women);diabetes mellitus;hypertension     Objective:    There were no vitals filed for this visit. There is no height or weight on file to calculate BMI.  Advanced Directives 01/03/2022 06/30/2021 01/01/2021 12/27/2020 11/28/2020 07/04/2020 06/27/2020  Does Patient Have a Medical Advance Directive? No No No No No No No  Does patient want to make changes to medical advance directive? - - No - Patient declined No - Patient declined - - -  Would patient like information on creating a medical advance directive? No - Patient declined No - Patient declined No - Patient declined No - Patient declined No - Patient declined No - Patient declined No - Patient declined    Current Medications (verified) Outpatient Encounter Medications as of 01/03/2022  Medication Sig   Accu-Chek Softclix Lancets lancets Use as instructed   aspirin EC 81 MG tablet Take 1 tablet (81 mg total) by mouth daily. Swallow whole.   atorvastatin (LIPITOR) 40 MG tablet TAKE 1 TABLET BY MOUTH AT BEDTIME.   Biotin w/ Vitamins C & E (HAIR SKIN & NAILS GUMMIES PO) Take 2 each by mouth every morning.   blood glucose meter kit and supplies Dispense based on patient and insurance preference. Use up to four times daily as directed. (FOR ICD-10 E10.9, E11.9).   cetirizine (ZYRTEC) 10 MG tablet Take 1 tablet (10 mg total) by mouth daily as needed (allergies).   famotidine  (PEPCID) 20 MG tablet Take 1 tablet (20 mg total) by mouth daily.   fish oil-omega-3 fatty acids 1000 MG capsule Take 1 g by mouth 2 (two) times daily.   glipiZIDE (GLUCOTROL XL) 5 MG 24 hr tablet TAKE 1 TABLET DAILY WITH BREAKFAST.   glucose blood (ACCU-CHEK GUIDE) test strip Use as instructed to monitor glucose twice daily   glucose blood test strip Use as instructed to monitor glucose twice daily   metFORMIN (GLUCOPHAGE) 500 MG tablet TAKE 1 TABLET BY MOUTH 2 TIMES A DAY WITH MEALS.   Multiple Vitamin (MULITIVITAMIN WITH MINERALS) TABS Take 1 tablet by mouth daily.   nitroGLYCERIN (NITROSTAT) 0.4 MG SL tablet Place 1 tablet (0.4 mg total) under the tongue every 5 (five) minutes x 3 doses as needed for chest pain (if no relief after 3rd dose, proceed to the ED or call 911).   polyethylene glycol (MIRALAX / GLYCOLAX) 17 g packet Take 17 g by mouth daily as needed for mild constipation or moderate constipation.   TRADJENTA 5 MG TABS tablet TAKE 1 TABLET ONCE DAILY.   [DISCONTINUED] metoprolol tartrate (LOPRESSOR) 25 MG tablet Take 0.5 tablets (12.5 mg total) by mouth 2 (two) times daily.   No facility-administered encounter medications on file as of 01/03/2022.    Allergies (verified) Iodinated contrast media, Codeine, Morphine and related, Other, and Penicillins   History: Past Medical History:  Diagnosis Date   Anxiety    CAD (coronary artery disease)    Multivessel status post CABG June 2021 -  LIMA to LAD, SVG to distal RCA, SVG to OM1   Chronic obstructive pulmonary disease, unspecified ([place])    Depression    Essential hypertension    GERD    History of pneumonia    Incarcerated ventral hernia    Osteoarthritis    Type 2 diabetes mellitus ([place])    Past Surgical History:  Procedure Laterality Date   CORONARY ARTERY BYPASS GRAFT N/A 05/22/2020   Procedure: CORONARY ARTERY BYPASS GRAFTING (CABG) x3 LIMA TO LAD, SVG TO OM1, SVG TO DISTAL RIGHT;  Surgeon: Lajuana Matte, MD;   Location: [facility];  Service: Open Heart Surgery;  Laterality: N/A;   LEFT HEART CATH AND CORONARY ANGIOGRAPHY N/A 05/21/2020   Procedure: LEFT HEART CATH AND CORONARY ANGIOGRAPHY;  Surgeon: Martinique, Peter M, MD;  Location: [facility];  Service: Cardiovascular;  Laterality: N/A;   NEPHRECTOMY     TEE WITHOUT CARDIOVERSION N/A 05/22/2020   Procedure: TRANSESOPHAGEAL ECHOCARDIOGRAM (TEE);  Surgeon: Lajuana Matte, MD;  Location: [facility];  Service: Open Heart Surgery;  Laterality: N/A;   VEIN HARVEST Right 05/22/2020   Procedure: Open Vein Harvest;  Surgeon: Lajuana Matte, MD;  Location: [facility];  Service: Open Heart Surgery;  Laterality: Right;   VENTRAL HERNIA REPAIR  09/01/2017   mistaken entry   Family History  Problem Relation Age of Onset   CAD Father    Heart failure Sister    CAD Daughter    Heart disease Daughter    Social History   Socioeconomic History   Marital status: Widowed    Spouse name: Not on file   Number of children: 1   Years of education: 77   Highest education level: 12th grade  Occupational History   Occupation: Retired  Tobacco Use   Smoking status: Never   Smokeless tobacco: Never  Vaping Use   Vaping Use: Never used  Substance and Sexual Activity   Alcohol use: No   Drug use: No   Sexual activity: Not Currently  Other Topics Concern   Not on file  Social History Narrative   Lives alone.  Has 1 daughter      Enjoys reading and cooking      Eats all food groups   Caffeine, some coffee   Water reports drinking pretty well throughout the day      Social Determinants of Health   Financial Resource Strain: Low Risk    Difficulty of Paying Living Expenses: Not very hard  Food Insecurity: No Food Insecurity   Worried About Charity fundraiser in the Last Year: Never true   Ran Out of Food in the Last Year: Never true  Transportation Needs: No Transportation Needs   Lack of Transportation (Medical): No   Lack of Transportation  (Non-Medical): No  Physical Activity: Insufficiently Active   Days of Exercise per Week: 3 days   Minutes of Exercise per Session: 20 min  Stress: No Stress Concern Present   Feeling of Stress : Only a little  Social Connections: Moderately Integrated   Frequency of Communication with Friends and Family: More than three times a week   Frequency of Social Gatherings with Friends and Family: More than three times a week   Attends Religious Services: More than 4 times per year   Active Member of [employer] or Organizations: Yes   Attends Archivist Meetings: Never   Marital Status: Widowed    Tobacco Counseling Counseling given: Not Answered   Clinical  Intake:  Pre-visit preparation completed: No  Pain : No/denies pain     Diabetes: Yes CBG done?: No Did pt. bring in CBG monitor from home?: No  How often do you need to have someone help you when you read instructions, pamphlets, or other written materials from your doctor or pharmacy?: 1 - Never What is the last grade level you completed in school?: 12th  Diabetic?Nutrition Risk Assessment:  Has the patient had any N/V/D within the last 2 months?  No  Does the patient have any non-healing wounds?  No  Has the patient had any unintentional weight loss or weight gain?  No   Diabetes:  Is the patient diabetic?  Yes  If diabetic, was a CBG obtained today?  No  Did the patient bring in their glucometer from home?   N/a How often do you monitor your CBG's? Four times daily.   Financial Strains and Diabetes Management:  Are you having any financial strains with the device, your supplies or your medication? No .  Does the patient want to be seen by Chronic Care Management for management of their diabetes?  Yes  Would the patient like to be referred to a Nutritionist or for Diabetic Management?  No   Diabetic Exams:  Diabetic Eye Exam: Completed 11/21/2021 Diabetic Foot Exam: Overdue, Pt has been advised about the  importance in completing this exam. Pt is scheduled for diabetic foot exam on 02/25/2022.   Interpreter Needed?: No  Information entered by :: Judeen Hammans   Activities of Daily Living In your present state of health, do you have any difficulty performing the following activities: 01/03/2022 06/30/2021  Hearing? N N  Vision? N N  Difficulty concentrating or making decisions? N N  Walking or climbing stairs? Y N  Dressing or bathing? N N  Doing errands, shopping? Y N  Preparing Food and eating ? N -  Using the Toilet? N -  In the past six months, have you accidently leaked urine? Y -  Do you have problems with loss of bowel control? N -  Managing your Medications? N -  Managing your Finances? N -  Housekeeping or managing your Housekeeping? N -  Some recent data might be hidden    Patient Care Team: [name], FNP as PCP - General (Nurse Practitioner) Satira Sark, MD as PCP - Cardiology (Cardiology) Verta Ellen., NP as Nurse Practitioner (Cardiology)  Indicate any recent Medical Services you may have received from other than [facility] providers in the past year (date may be approximate).     Assessment:   This is a routine wellness examination for [name] [name].  Hearing/Vision screen No results found.  Dietary issues and exercise activities discussed: Current Exercise Habits: Home exercise routine, Type of exercise: walking, Time (Minutes): 20, Frequency (Times/Week): 3, Weekly Exercise (Minutes/Week): 60, Intensity: Mild, Exercise limited by: cardiac condition(s);psychological condition(s)   Goals Addressed   None   Depression Screen PHQ 2/9 Scores 01/03/2022 11/27/2021 08/21/2021 07/17/2021 03/28/2021 02/27/2021 02/05/2021  PHQ - 2 Score 1 0 0 1 0 1 1  PHQ- 9 Score - - - - - 3 4    Fall Risk Fall Risk  01/03/2022 11/27/2021 08/21/2021 07/17/2021 03/28/2021  Falls in the past year? 0 0 0 0 0  Comment - - - - -  Number falls in past yr: 0 0 0 0 0  Comment - - - - -   Injury with Fall? 0 0 0 0 0  Risk for fall due to : No Fall Risks No Fall Risks No Fall Risks No Fall Risks -  Risk for fall due to: Comment - - - - -  Follow up Falls evaluation completed Falls evaluation completed Falls evaluation completed Falls evaluation completed -    FALL RISK PREVENTION PERTAINING TO THE HOME:  Any stairs in or around the home? Yes  If so, are there any without handrails? No  Home free of loose throw rugs in walkways, pet beds, electrical cords, etc? Yes  Adequate lighting in your home to reduce risk of falls? Yes   ASSISTIVE DEVICES UTILIZED TO PREVENT FALLS:  Life alert? No  Use of a cane, walker or w/c? No  Grab bars in the bathroom? No  Shower chair or bench in shower? No  Elevated toilet seat or a handicapped toilet? No    Cognitive Function:     6CIT Screen 01/01/2021  What Year? 0 points  What month? 0 points  What time? 0 points  Count back from 20 0 points  Months in reverse 0 points  Repeat phrase 0 points  Total Score 0    Immunizations Immunization History  Administered Date(s) Administered   Influenza, High Dose Seasonal PF 11/12/2017, 08/13/2018   Influenza-Unspecified 11/12/2011, 11/03/2012, 11/02/2013, 09/20/2014, 09/19/2015, 09/23/2016, 08/18/2019   Pneumococcal Conjugate-13 09/20/2014   Pneumococcal Polysaccharide-23 03/18/2016    TDAP status: Due, Education has been provided regarding the importance of this vaccine. Advised may receive this vaccine at local pharmacy or Health Dept. Aware to provide a copy of the vaccination record if obtained from local pharmacy or Health Dept. Verbalized acceptance and understanding.  Flu Vaccine status: Due, Education has been provided regarding the importance of this vaccine. Advised may receive this vaccine at local pharmacy or Health Dept. Aware to provide a copy of the vaccination record if obtained from local pharmacy or Health Dept. Verbalized acceptance and  understanding.  Pneumococcal vaccine status: Up to date  Covid-19 vaccine status: Information provided on how to obtain vaccines.   Qualifies for Shingles Vaccine? Yes   Zostavax completed No   Shingrix Completed?: No.    Education has been provided regarding the importance of this vaccine. Patient has been advised to call insurance company to determine out of pocket expense if they have not yet received this vaccine. Advised may also receive vaccine at local pharmacy or Health Dept. Verbalized acceptance and understanding.  Screening Tests Health Maintenance  Topic Date Due   DEXA SCAN  Never done   COVID-19 Vaccine (1) Never done   Zoster Vaccines- Shingrix (1 of 2) Never done   URINE MICROALBUMIN  02/27/2022   INFLUENZA VACCINE  03/14/2022 (Originally 07/15/2021)   TETANUS/TDAP  07/17/2022 (Originally 09/29/1960)   Pneumonia Vaccine 41+ Years old  Completed   HPV VACCINES  Aged Out    Health Maintenance  Health Maintenance Due  Topic Date Due   DEXA SCAN  Never done   COVID-19 Vaccine (1) Never done   Zoster Vaccines- Shingrix (1 of 2) Never done   URINE MICROALBUMIN  02/27/2022    Colorectal cancer screening: No longer required.   Mammogram status: No longer required due to age.  Bone Density status: Ordered 01/03/2022. Pt provided with contact info and advised to call to schedule appt.  Lung Cancer Screening: (Low Dose CT Chest recommended if Age 39-80 years, 30 pack-year currently smoking OR have quit w/in 15years.) does not qualify.   Lung Cancer Screening Referral: n/a  Additional  Screening:  Hepatitis C Screening: does not qualify; Completed not at high risk  Vision Screening: Recommended annual ophthalmology exams for early detection of glaucoma and other disorders of the eye. Is the patient up to date with their annual eye exam?  Yes  Who is the provider or what is the name of the office in which the patient attends annual eye exams? [facility] [place] If pt  is not established with a provider, would they like to be referred to a provider to establish care? No .   Dental Screening: Recommended annual dental exams for proper oral hygiene  Community Resource Referral / Chronic Care Management: CRR required this visit?  No   CCM required this visit?  No      Plan:     I have personally reviewed and noted the following in the patients chart:   Medical and social history Use of alcohol, tobacco or illicit drugs  Current medications and supplements including opioid prescriptions.  Functional ability and status Nutritional status Physical activity Advanced directives List of other physicians Hospitalizations, surgeries, and ER visits in previous 12 months Vitals Screenings to include cognitive, depression, and falls Referrals and appointments  In addition, I have reviewed and discussed with patient certain preventive protocols, quality metrics, and best practice recommendations. A written personalized care plan for preventive services as well as general preventive health recommendations were provided to patient.     Earline Mayotte, [place]   01/03/2022   Nurse Notes:  Ms. Rothenberger , Thank you for taking time to come for your Medicare Wellness Visit. I appreciate your ongoing commitment to your health goals. Please review the following plan we discussed and let me know if I can assist you in the future.   These are the goals we discussed:  Goals      Activity and Exercise Increased     Evidence-based guidance:  Review current exercise levels.  Assess patient perspective on exercise or activity level, barriers to increasing activity, motivation and readiness for change.  Recommend or set healthy exercise goal based on individual tolerance.  Encourage small steps toward making change in amount of exercise or activity.  Urge reduction of sedentary activities or screen time.  Promote group activities within the community or with family or  support person.  Consider referral to rehabiliation therapist for assessment and exercise/activity plan.   Notes:      Monitor and Manage My Blood Sugar-Diabetes Type 2     Timeframe:  Long-Range Goal Priority:  High Start Date: 12/27/20                            Expected End Date:  06/13/21                     Follow Up Date 04/13/21    - check blood sugar at prescribed times - enter blood sugar readings and medication or insulin into daily log - take the blood sugar meter to all doctor visits    Why is this important?   Checking your blood sugar at home helps to keep it from getting very high or very low.  Writing the results in a diary or log helps the doctor know how to care for you.  Your blood sugar log should have the time, date and the results.  Also, write down the amount of insulin or other medicine that you take.  Other information,  like what you ate, exercise done and how you were feeling, will also be helpful.     Notes: Patient reports that she takes her blood sugar daily and records the values. Patient acknowledges that her A1c has elevated to 11.5. Patient explains that she has started the new medication Metformin to help lower her A1c and she will work on changing her diabetic diet by doing what she knows works best for her. Nurse will send patient diabetic education.     Obtain Eye Exam-Diabetes Type 2     Timeframe:  Long-Range Goal Priority:  High Start Date: 12/27/20                            Expected End Date: 04/13/21                    Follow Up Date 04/13/21   - schedule appointment with eye doctor    Why is this important?   Eye check-ups are important when you have diabetes.  Vision loss can be prevented.    Notes: Patient stated that it has 3-4 years since she had an eye exam. Nurse provided education regarding eye damage risk with having diabetes and encouraged patient to make an appointment. Patient stated that she does need a new pair of eyeglasses  and she will make an eye exam appointment.         This is a list of the screening recommended for you and due dates:  Health Maintenance  Topic Date Due   DEXA scan (bone density measurement)  Never done   COVID-19 Vaccine (1) Never done   Zoster (Shingles) Vaccine (1 of 2) Never done   Urine Protein Check  02/27/2022   Flu Shot  03/14/2022*   Tetanus Vaccine  07/17/2022*   Pneumonia Vaccine  Completed   HPV Vaccine  Aged Out  *Topic was postponed. The date shown is not the original due date.

## 2022-01-03 NOTE — Patient Instructions (Addendum)
Ms. [name] [name] , Thank you for taking time to come for your Medicare Wellness Visit. I appreciate your ongoing commitment to your health goals. Please review the following plan we discussed and let me know if I can assist you in the future.   These are the goals we discussed:  Goals      Activity and Exercise Increased     Evidence-based guidance:  Review current exercise levels.  Assess patient perspective on exercise or activity level, barriers to increasing activity, motivation and readiness for change.  Recommend or set healthy exercise goal based on individual tolerance.  Encourage small steps toward making change in amount of exercise or activity.  Urge reduction of sedentary activities or screen time.  Promote group activities within the community or with family or support person.  Consider referral to rehabiliation therapist for assessment and exercise/activity plan.   Notes:      Monitor and Manage My Blood Sugar-Diabetes Type 2     Timeframe:  Long-Range Goal Priority:  High Start Date: 12/27/20                            Expected End Date:  06/13/21                     Follow Up Date 04/13/21    - check blood sugar at prescribed times - enter blood sugar readings and medication or insulin into daily log - take the blood sugar meter to all doctor visits    Why is this important?   Checking your blood sugar at home helps to keep it from getting very high or very low.  Writing the results in a diary or log helps the doctor know how to care for you.  Your blood sugar log should have the time, date and the results.  Also, write down the amount of insulin or other medicine that you take.  Other information, like what you ate, exercise done and how you were feeling, will also be helpful.     Notes: Patient reports that she takes her blood sugar daily and records the values. Patient acknowledges that her A1c has elevated to 11.5. Patient explains that she has started the new  medication Metformin to help lower her A1c and she will work on changing her diabetic diet by doing what she knows works best for her. Nurse will send patient diabetic education.     Obtain Eye Exam-Diabetes Type 2     Timeframe:  Long-Range Goal Priority:  High Start Date: 12/27/20                            Expected End Date: 04/13/21                    Follow Up Date 04/13/21   - schedule appointment with eye doctor    Why is this important?   Eye check-ups are important when you have diabetes.  Vision loss can be prevented.    Notes: Patient stated that it has 3-4 years since she had an eye exam. Nurse provided education regarding eye damage risk with having diabetes and encouraged patient to make an appointment. Patient stated that she does need a new pair of eyeglasses and she will make an eye exam appointment.         This is a list of the screening recommended for  you and due dates:  Health Maintenance  Topic Date Due   DEXA scan (bone density measurement)  Never done   COVID-19 Vaccine (1) Never done   Zoster (Shingles) Vaccine (1 of 2) Never done   Urine Protein Check  02/27/2022   Flu Shot  03/14/2022*   Tetanus Vaccine  07/17/2022*   Pneumonia Vaccine  Completed   HPV Vaccine  Aged Out  *Topic was postponed. The date shown is not the original due date.    Health Maintenance, Female Adopting a healthy lifestyle and getting preventive care are important in promoting health and wellness. Ask your health care provider about: The right schedule for you to have regular tests and exams. Things you can do on your own to prevent diseases and keep yourself healthy. What should I know about diet, weight, and exercise? Eat a healthy diet  Eat a diet that includes plenty of vegetables, fruits, low-fat dairy products, and lean protein. Do not eat a lot of foods that are high in solid fats, added sugars, or sodium. Maintain a healthy weight Body mass index (BMI) is used to  identify weight problems. It estimates body fat based on height and weight. Your health care provider can help determine your BMI and help you achieve or maintain a healthy weight. Get regular exercise Get regular exercise. This is one of the most important things you can do for your health. Most adults should: Exercise for at least 150 minutes each week. The exercise should increase your heart rate and make you sweat (moderate-intensity exercise). Do strengthening exercises at least twice a week. This is in addition to the moderate-intensity exercise. Spend less time sitting. Even light physical activity can be beneficial. Watch cholesterol and blood lipids Have your blood tested for lipids and cholesterol at 81 years of age, then have this test every 5 years. Have your cholesterol levels checked more often if: Your lipid or cholesterol levels are high. You are older than 81 years of age. You are at high risk for heart disease. What should I know about cancer screening? Depending on your health history and family history, you may need to have cancer screening at various ages. This may include screening for: Breast cancer. Cervical cancer. Colorectal cancer. Skin cancer. Lung cancer. What should I know about heart disease, diabetes, and high blood pressure? Blood pressure and heart disease High blood pressure causes heart disease and increases the risk of stroke. This is more likely to develop in people who have high blood pressure readings or are overweight. Have your blood pressure checked: Every 3-5 years if you are 67-83 years of age. Every year if you are 55 years old or older. Diabetes Have regular diabetes screenings. This checks your fasting blood sugar level. Have the screening done: Once every three years after age 9 if you are at a normal weight and have a low risk for diabetes. More often and at a younger age if you are overweight or have a high risk for diabetes. What  should I know about preventing infection? Hepatitis B If you have a higher risk for hepatitis B, you should be screened for this virus. Talk with your health care provider to find out if you are at risk for hepatitis B infection. Hepatitis C Testing is recommended for: Everyone born from [birth year] through 1965. Anyone with known risk factors for hepatitis C. Sexually transmitted infections (STIs) Get screened for STIs, including gonorrhea and chlamydia, if: You are sexually active and are younger  than 81 years of age. You are older than 81 years of age and your health care provider tells you that you are at risk for this type of infection. Your sexual activity has changed since you were last screened, and you are at increased risk for chlamydia or gonorrhea. Ask your health care provider if you are at risk. Ask your health care provider about whether you are at high risk for HIV. Your health care provider may recommend a prescription medicine to help prevent HIV infection. If you choose to take medicine to prevent HIV, you should first get tested for HIV. You should then be tested every 3 months for as long as you are taking the medicine. Pregnancy If you are about to stop having your period (premenopausal) and you may become pregnant, seek counseling before you get pregnant. Take 400 to 800 micrograms (mcg) of folic acid every day if you become pregnant. Ask for birth control (contraception) if you want to prevent pregnancy. Osteoporosis and menopause Osteoporosis is a disease in which the bones lose minerals and strength with aging. This can result in bone fractures. If you are 21 years old or older, or if you are at risk for osteoporosis and fractures, ask your health care provider if you should: Be screened for bone loss. Take a calcium or vitamin D supplement to lower your risk of fractures. Be given hormone replacement therapy (HRT) to treat symptoms of menopause. Follow these instructions at  home: Alcohol use Do not drink alcohol if: Your health care provider tells you not to drink. You are pregnant, may be pregnant, or are planning to become pregnant. If you drink alcohol: Limit how much you have to: 0-1 drink a day. Know how much alcohol is in your drink. In the [place] one drink equals one 12 oz bottle of beer (355 mL), one 5 oz glass of wine (148 mL), or one 1 oz glass of hard liquor (44 mL). Lifestyle Do not use any products that contain nicotine or tobacco. These products include cigarettes, chewing tobacco, and vaping devices, such as e-cigarettes. If you need help quitting, ask your health care provider. Do not use street drugs. Do not share needles. Ask your health care provider for help if you need support or information about quitting drugs. General instructions Schedule regular health, dental, and eye exams. Stay current with your vaccines. Tell your health care provider if: You often feel depressed. You have ever been abused or do not feel safe at home. Summary Adopting a healthy lifestyle and getting preventive care are important in promoting health and wellness. Follow your health care provider's instructions about healthy diet, exercising, and getting tested or screened for diseases. Follow your health care provider's instructions on monitoring your cholesterol and blood pressure. This information is not intended to replace advice given to you by your health care provider. Make sure you discuss any questions you have with your health care provider. Document Revised: 04/22/2021 Document Reviewed: 04/22/2021 [employer] Patient Education  2022 [employer].

## 2022-01-06 ENCOUNTER — Encounter: Payer: Self-pay | Admitting: Nurse Practitioner

## 2022-01-06 ENCOUNTER — Other Ambulatory Visit: Payer: Self-pay

## 2022-01-06 ENCOUNTER — Ambulatory Visit (INDEPENDENT_AMBULATORY_CARE_PROVIDER_SITE_OTHER): Payer: Medicare Other | Admitting: Nurse Practitioner

## 2022-01-06 VITALS — BP 152/68 | HR 55 | Ht 59.0 in | Wt 150.0 lb

## 2022-01-06 DIAGNOSIS — E559 Vitamin D deficiency, unspecified: Secondary | ICD-10-CM | POA: Diagnosis not present

## 2022-01-06 DIAGNOSIS — E1121 Type 2 diabetes mellitus with diabetic nephropathy: Secondary | ICD-10-CM | POA: Diagnosis not present

## 2022-01-06 DIAGNOSIS — E782 Mixed hyperlipidemia: Secondary | ICD-10-CM

## 2022-01-06 DIAGNOSIS — I10 Essential (primary) hypertension: Secondary | ICD-10-CM

## 2022-01-06 DIAGNOSIS — Z905 Acquired absence of kidney: Secondary | ICD-10-CM

## 2022-01-06 LAB — POCT GLYCOSYLATED HEMOGLOBIN (HGB A1C): HbA1c, POC (controlled diabetic range): 5.8 % (ref 0.0–7.0)

## 2022-01-06 MED ORDER — LINAGLIPTIN 5 MG PO TABS: 5.0000 mg | ORAL_TABLET | Freq: Every day | ORAL | 3 refills | Status: DC

## 2022-01-06 MED ORDER — GLIPIZIDE ER 5 MG PO TB24: 5.0000 mg | ORAL_TABLET | Freq: Every day | ORAL | 3 refills | Status: DC

## 2022-01-06 MED ORDER — METFORMIN HCL 500 MG PO TABS: 500.0000 mg | ORAL_TABLET | Freq: Every day | ORAL | 3 refills | Status: DC

## 2022-01-06 NOTE — Progress Notes (Signed)
Endocrinology Follow Up Note       01/06/2022, 11:30 AM   Subjective:    Patient ID: [name] [name], female    DOB: 1941-08-19.  [name] is being seen in follow up after being seen in consultation for management of currently uncontrolled symptomatic diabetes requested by  [name], FNP.   Past Medical History:  Diagnosis Date   Anxiety    CAD (coronary artery disease)    Multivessel status post CABG June 2021 - LIMA to LAD, SVG to distal RCA, SVG to OM1   Chronic obstructive pulmonary disease, unspecified ([place])    Depression    Essential hypertension    GERD    History of pneumonia    Incarcerated ventral hernia    Osteoarthritis    Type 2 diabetes mellitus ([place])     Past Surgical History:  Procedure Laterality Date   CORONARY ARTERY BYPASS GRAFT N/A 05/22/2020   Procedure: CORONARY ARTERY BYPASS GRAFTING (CABG) x3 LIMA TO LAD, SVG TO OM1, SVG TO DISTAL RIGHT;  Surgeon: Lajuana Matte, MD;  Location: [facility];  Service: Open Heart Surgery;  Laterality: N/A;   LEFT HEART CATH AND CORONARY ANGIOGRAPHY N/A 05/21/2020   Procedure: LEFT HEART CATH AND CORONARY ANGIOGRAPHY;  Surgeon: Martinique, Peter M, MD;  Location: [facility];  Service: Cardiovascular;  Laterality: N/A;   NEPHRECTOMY     TEE WITHOUT CARDIOVERSION N/A 05/22/2020   Procedure: TRANSESOPHAGEAL ECHOCARDIOGRAM (TEE);  Surgeon: Lajuana Matte, MD;  Location: [facility];  Service: Open Heart Surgery;  Laterality: N/A;   VEIN HARVEST Right 05/22/2020   Procedure: Open Vein Harvest;  Surgeon: Lajuana Matte, MD;  Location: [facility];  Service: Open Heart Surgery;  Laterality: Right;   VENTRAL HERNIA REPAIR  09/01/2017   mistaken entry    Social History   Socioeconomic History   Marital status: Widowed    Spouse name: Not on file   Number of children: 1   Years of education: 94   Highest education level: 12th grade   Occupational History   Occupation: Retired  Tobacco Use   Smoking status: Never   Smokeless tobacco: Never  Vaping Use   Vaping Use: Never used  Substance and Sexual Activity   Alcohol use: No   Drug use: No   Sexual activity: Not Currently  Other Topics Concern   Not on file  Social History Narrative   Lives alone.  Has 1 daughter      Enjoys reading and cooking      Eats all food groups   Caffeine, some coffee   Water reports drinking pretty well throughout the day      Social Determinants of Health   Financial Resource Strain: Low Risk    Difficulty of Paying Living Expenses: Not very hard  Food Insecurity: No Food Insecurity   Worried About Charity fundraiser in the Last Year: Never true   Ran Out of Food in the Last Year: Never true  Transportation Needs: No Transportation Needs   Lack of Transportation (Medical): No   Lack of Transportation (Non-Medical): No  Physical Activity: Insufficiently Active   Days of Exercise  per Week: 3 days   Minutes of Exercise per Session: 20 min  Stress: No Stress Concern Present   Feeling of Stress : Only a little  Social Connections: Moderately Integrated   Frequency of Communication with Friends and Family: More than three times a week   Frequency of Social Gatherings with Friends and Family: More than three times a week   Attends Religious Services: More than 4 times per year   Active Member of [employer] or Organizations: Yes   Attends Archivist Meetings: Never   Marital Status: Widowed    Family History  Problem Relation Age of Onset   CAD Father    Heart failure Sister    CAD Daughter    Heart disease Daughter     Outpatient Encounter Medications as of 01/06/2022  Medication Sig   Accu-Chek Softclix Lancets lancets Use as instructed   aspirin EC 81 MG tablet Take 1 tablet (81 mg total) by mouth daily. Swallow whole.   atorvastatin (LIPITOR) 40 MG tablet TAKE 1 TABLET BY MOUTH AT BEDTIME.   Biotin w/  Vitamins C & E (HAIR SKIN & NAILS GUMMIES PO) Take 2 each by mouth every morning.   blood glucose meter kit and supplies Dispense based on patient and insurance preference. Use up to four times daily as directed. (FOR ICD-10 E10.9, E11.9).   cetirizine (ZYRTEC) 10 MG tablet Take 1 tablet (10 mg total) by mouth daily as needed (allergies).   famotidine (PEPCID) 20 MG tablet Take 1 tablet (20 mg total) by mouth daily.   fish oil-omega-3 fatty acids 1000 MG capsule Take 1 g by mouth 2 (two) times daily.   glipiZIDE (GLUCOTROL XL) 5 MG 24 hr tablet Take 1 tablet (5 mg total) by mouth daily with breakfast.   glucose blood (ACCU-CHEK GUIDE) test strip Use as instructed to monitor glucose twice daily   glucose blood test strip Use as instructed to monitor glucose twice daily   linagliptin (TRADJENTA) 5 MG TABS tablet Take 1 tablet (5 mg total) by mouth daily.   metFORMIN (GLUCOPHAGE) 500 MG tablet Take 1 tablet (500 mg total) by mouth daily with breakfast.   Multiple Vitamin (MULITIVITAMIN WITH MINERALS) TABS Take 1 tablet by mouth daily.   nitroGLYCERIN (NITROSTAT) 0.4 MG SL tablet Place 1 tablet (0.4 mg total) under the tongue every 5 (five) minutes x 3 doses as needed for chest pain (if no relief after 3rd dose, proceed to the ED or call 911).   polyethylene glycol (MIRALAX / GLYCOLAX) 17 g packet Take 17 g by mouth daily as needed for mild constipation or moderate constipation.   [DISCONTINUED] glipiZIDE (GLUCOTROL XL) 5 MG 24 hr tablet TAKE 1 TABLET DAILY WITH BREAKFAST.   [DISCONTINUED] metFORMIN (GLUCOPHAGE) 500 MG tablet TAKE 1 TABLET BY MOUTH 2 TIMES A DAY WITH MEALS.   [DISCONTINUED] metoprolol tartrate (LOPRESSOR) 25 MG tablet Take 0.5 tablets (12.5 mg total) by mouth 2 (two) times daily.   [DISCONTINUED] TRADJENTA 5 MG TABS tablet TAKE 1 TABLET ONCE DAILY.   No facility-administered encounter medications on file as of 01/06/2022.    ALLERGIES: Allergies  Allergen Reactions   Iodinated  Contrast Media Other (See Comments)    Unknown reaction. Due to kidney issues. Do not use Unknown reaction. Due to kidney issues. Do not use   Codeine Rash   Morphine And Related Other (See Comments) and Rash    Unknown Reaction Unknown Reaction   Other     Pt has  multiple allergies per pt but unsure of all.    Penicillins Other (See Comments)    Unknown reaction Unknown reaction    VACCINATION STATUS: Immunization History  Administered Date(s) Administered   Influenza, High Dose Seasonal PF 11/12/2017, 08/13/2018   Influenza-Unspecified 11/12/2011, 11/03/2012, 11/02/2013, 09/20/2014, 09/19/2015, 09/23/2016, 08/18/2019   Pneumococcal Conjugate-13 09/20/2014   Pneumococcal Polysaccharide-23 03/18/2016    Diabetes She presents for her follow-up diabetic visit. She has type 2 diabetes mellitus. Onset time: Was diagnosed at approx age of 14. Her disease course has been improving. There are no hypoglycemic associated symptoms. Associated symptoms include blurred vision and foot paresthesias. Pertinent negatives for diabetes include no fatigue, no polydipsia and no polyuria. There are no hypoglycemic complications. Symptoms are stable. Diabetic complications include heart disease, nephropathy and peripheral neuropathy. Risk factors for coronary artery disease include diabetes mellitus, dyslipidemia, hypertension, stress, tobacco exposure, sedentary lifestyle and post-menopausal. Current diabetic treatment includes oral agent (triple therapy). She is compliant with treatment most of the time. Her weight is stable. She is following a generally healthy diet. When asked about meal planning, she reported none. She has not had a previous visit with a dietitian. She rarely participates in exercise. Her home blood glucose trend is fluctuating minimally. Her breakfast blood glucose range is generally 90-110 mg/dl. Her bedtime blood glucose range is generally 110-130 mg/dl. (She presents today, accompanied  by her daughter, with her logs showing at target fasting and postprandial glycemic profile.  Her POCT A1c today is 5.8% improving further from last visit of 6.2%.  She has done well with her diet and exercise.  She denies any s/s of hypoglycemia.  She will be having eye surgery soon to remove cataracts.) An ACE inhibitor/angiotensin II receptor blocker is not being taken. She does not see a podiatrist.Eye exam is current.  Hyperlipidemia This is a chronic problem. The current episode started more than 1 year ago. The problem is uncontrolled. Recent lipid tests were reviewed and are variable. Exacerbating diseases include chronic renal disease and diabetes. Factors aggravating her hyperlipidemia include fatty foods and beta blockers. Current antihyperlipidemic treatment includes statins. The current treatment provides mild improvement of lipids. Compliance problems include adherence to diet and adherence to exercise.  Risk factors for coronary artery disease include diabetes mellitus, dyslipidemia, hypertension, post-menopausal, a sedentary lifestyle and stress.  Hypertension This is a chronic problem. The current episode started more than 1 year ago. The problem has been waxing and waning since onset. The problem is controlled. Associated symptoms include blurred vision. There are no associated agents to hypertension. Risk factors for coronary artery disease include diabetes mellitus, dyslipidemia, sedentary lifestyle, smoking/tobacco exposure, stress and post-menopausal state. Past treatments include beta blockers. The current treatment provides mild improvement. There are no compliance problems.  Hypertensive end-organ damage includes kidney disease and CAD/MI. Identifiable causes of hypertension include chronic renal disease.    Review of systems  Constitutional: + Minimally fluctuating body weight,  current Body mass index is 30.3 kg/m. , no fatigue, no subjective hyperthermia, no subjective  hypothermia Eyes: no blurry vision, no xerophthalmia ENT: no sore throat, no nodules palpated in throat, no dysphagia/odynophagia, no hoarseness Cardiovascular: no chest pain, no shortness of breath, no palpitations, no leg swelling Respiratory: no cough, no shortness of breath Gastrointestinal: no nausea/vomiting/diarrhea Musculoskeletal: no muscle/joint aches Skin: no rashes, no hyperemia Neurological: no tremors, no numbness, no tingling, no dizziness Psychiatric: no depression, no anxiety  Objective:     BP (!) 152/68    Pulse Marland Kitchen)  55    Ht _0  (1.499 m)    Wt 150 lb (68 kg)    BMI 30.30 kg/m   Wt Readings from Last 3 Encounters:  01/06/22 150 lb (68 kg)  01/02/22 151 lb 9.6 oz (68.8 kg)  11/27/21 151 lb 0.6 oz (68.5 kg)     BP Readings from Last 3 Encounters:  01/06/22 (!) 152/68  01/02/22 118/76  11/27/21 128/85      Physical Exam- Limited  Constitutional:  Body mass index is 30.3 kg/m. , not in acute distress, anxious state of mind Eyes:  EOMI, no exophthalmos Neck: Supple Cardiovascular: RRR, no murmurs, rubs, or gallops, no edema Respiratory: Adequate breathing efforts, no crackles, rales, rhonchi, or wheezing Musculoskeletal: she does have abnormal bulge in LLQ of abdomen (unknown etiology), strength intact in all four extremities, no gross restriction of joint movements Skin:  no rashes, no hyperemia Neurological: no tremor with outstretched hands   CMP ( most recent) CMP     Component Value Date/Time   NA 141 11/21/2021 1115   K 5.1 11/21/2021 1115   CL 103 11/21/2021 1115   CO2 21 11/21/2021 1115   GLUCOSE 110 (H) 11/21/2021 1115   GLUCOSE 167 (H) 07/02/2021 0328   BUN 27 11/21/2021 1115   CREATININE 1.38 (H) 11/21/2021 1115   CREATININE 1.28 (H) 03/07/2020 1137   CALCIUM 9.3 11/21/2021 1115   PROT 6.8 11/21/2021 1115   ALBUMIN 4.5 11/21/2021 1115   AST 23 11/21/2021 1115   ALT 30 11/21/2021 1115   ALKPHOS 69 11/21/2021 1115   BILITOT 0.3  11/21/2021 1115   GFRNONAA 35 (L) 07/02/2021 0328   GFRNONAA 40 (L) 03/07/2020 1137   GFRAA 48 (L) 12/28/2020 0955   GFRAA 46 (L) 03/07/2020 1137     Diabetic Labs (most recent): Lab Results  Component Value Date   HGBA1C 5.8 01/06/2022   HGBA1C 6.2 (H) 06/30/2021   HGBA1C 7.8 (A) 02/27/2021     Lipid Panel ( most recent) Lipid Panel     Component Value Date/Time   CHOL 121 11/21/2021 1115   TRIG 157 (H) 11/21/2021 1115   HDL 39 (L) 11/21/2021 1115   CHOLHDL 3.2 08/22/2021 1022   CHOLHDL 2.9 06/28/2021 1041   VLDL 34 06/28/2021 1041   LDLCALC 55 11/21/2021 1115   LDLCALC 109 (H) 03/07/2020 1137   LABVLDL 27 11/21/2021 1115      Lab Results  Component Value Date   TSH 3.040 08/22/2021   TSH 2.02 11/17/2019   FREET4 1.25 08/22/2021           Assessment & Plan:   1) Uncontrolled Type 2 Diabetes with CKD stage 3  - LAMOINE FREDRICKSEN has currently uncontrolled symptomatic type 2 DM since 81 years of age.  She presents today, accompanied by her daughter, with her logs showing at target fasting and postprandial glycemic profile.  Her POCT A1c today is 5.8% improving further from last visit of 6.2%.  She has done well with her diet and exercise.  She denies any s/s of hypoglycemia.  She will be having eye surgery soon to remove cataracts.  -Recent labs reviewed.  - I had a long discussion with her about the progressive nature of diabetes and the pathology behind its complications. -her diabetes is complicated by CAD and she remains at a high risk for more acute and chronic complications which include CAD, CVA, CKD, retinopathy, and neuropathy. These are all discussed in detail with her.  -  Nutritional counseling repeated at each appointment due to patients tendency to fall back in to old habits.  - The patient admits there is a room for improvement in their diet and drink choices. -  Suggestion is made for the patient to avoid simple carbohydrates from their diet  including Cakes, Sweet Desserts / Pastries, Ice Cream, Soda (diet and regular), Sweet Tea, Candies, Chips, Cookies, Sweet Pastries, Store Bought Juices, Alcohol in Excess of 1-2 drinks a day, Artificial Sweeteners, Coffee Creamer, and "Sugar-free" Products. This will help patient to have stable blood glucose profile and potentially avoid unintended weight gain.   - I encouraged the patient to switch to unprocessed or minimally processed complex starch and increased protein intake (animal or plant source), fruits, and vegetables.   - Patient is advised to stick to a routine mealtimes to eat 3 meals a day and avoid unnecessary snacks (to snack only to correct hypoglycemia).  - she will be scheduled with Jearld Fenton, RDN, CDE for diabetes education.  - I have approached her with the following individualized plan to manage  her diabetes and patient agrees:   -Based on her improving glycemic profile, will lower her Metformin to 500 mg po once daily with breakfast.  She can continue her Tradjenta 5 mg po daily with breakfast and Glipizide 5 mg XL daily with breakfast.   -she is encouraged to continue monitoring blood glucose twice daily, before breakfast and before bed, and to call the clinic if she has readings less than 70 or greater than 200 for 3 day out of the week.  - Specific targets for  A1c;  LDL, HDL,  and Triglycerides were discussed with the patient.  2) Blood Pressure /Hypertension:  her blood pressure is controlled to target for her age.   she is not currently on any antihypertensive medications at this time.  3) Lipids/Hyperlipidemia:    Review of her recent lipid panel from 11/21/21 showed controlled  LDL at 55 and elevated triglycerides of 157.  she  is advised to continue Lipitor 40 mg daily at bedtime and Fish Oil supplement.  Side effects and precautions discussed with her.    4)  Weight/Diet:  her Body mass index is 30.3 kg/m.  -  clearly complicating her diabetes care.   she  is a candidate for weight loss. I discussed with her the fact that loss of 5 - 10% of her  current body weight will have the most impact on her diabetes management.  Exercise, and detailed carbohydrates information provided  -  detailed on discharge instructions.  5) Hypervitaminosis of vitamin D Her most recent vitamin D level on 08/22/21 was high at 113.  She was taking several supplements that contained vitamin D in it.  The nephrologist asked her to stop all vitamin D supplements at this time.  6) Chronic Care/Health Maintenance: -she is on Statin medications and is encouraged to initiate and continue to follow up with Ophthalmology, Dentist, Podiatrist at least yearly or according to recommendations, and advised to stay away from smoking. I have recommended yearly flu vaccine and pneumonia vaccine at least every 5 years; moderate intensity exercise for up to 150 minutes weekly; and sleep for at least 7 hours a day.  - she is advised to maintain close follow up with [name], FNP for primary care needs, as well as her other providers for optimal and coordinated care.      I spent 44 minutes in the care of the patient  today including review of labs from [place], Lipids, Thyroid Function, Hematology (current and previous including abstractions from other facilities); face-to-face time discussing  her blood glucose readings/logs, discussing hypoglycemia and hyperglycemia episodes and symptoms, medications doses, her options of short and long term treatment based on the latest standards of care / guidelines;  discussion about incorporating lifestyle medicine;  and documenting the encounter.    Please refer to Patient Instructions for Blood Glucose Monitoring and Insulin/Medications Dosing Guide"  in media tab for additional information. Please  also refer to " Patient Self Inventory" in the Media  tab for reviewed elements of pertinent patient history.  Judee Clara Blatchley participated in the  discussions, expressed understanding, and voiced agreement with the above plans.  All questions were answered to her satisfaction. she is encouraged to contact clinic should she have any questions or concerns prior to her return visit.   Follow up plan: - Return in about 4 months (around 05/06/2022) for Diabetes F/U with A1c in office, No previsit labs, Bring meter and logs.  Rayetta Pigg, [facility] [address] [place], [place] [address] Phone: [phone] Fax: [phone]  01/06/2022, 11:30 AM

## 2022-01-06 NOTE — Patient Instructions (Signed)
Diabetes Mellitus Emergency Preparedness Plan A diabetes emergency preparedness plan is a checklist to make sure you have everything you need to manage your diabetes in case of an emergency, such as an evacuation, natural disaster, national security emergency, or pandemic lockdown. Managing your diabetes is something you have to do all day every day. The American Diabetes Association and the [employer] of Endocrinology both recommend putting together an emergency diabetes kit. Your kit should include important information and documents as well as all the supplies you will need to manage your diabetes for at least 1 week. Store it in a portable, Engineer, materials. The best time to start making your emergency kit is now. How to make your emergency kit Collect information and documents Include the following information and documents in your kit: The type of diabetes you have. A copy of your health insurance cards and photo ID. A list of all your other medical conditions, allergies, and surgeries. A list of all your medicines and doses with the contact information for your pharmacy. Ask your health care provider for a list of your current medicines. Any recent lab results, including your latest hemoglobin A1C (HbA1C). The make, model, and serial number of your insulin pump, if you use one. Also include contact information for the manufacturer. Contact information for people who should be notified in case of an emergency. Include your health care provider's name, address, and phone number. Collect diabetes care items Include the following diabetes care items in your kit: At least a 1-week supply of: Oral medicines. Insulin. Blood glucose testing supplies. These include testing strips, lancets, and extra batteries for your blood glucose monitor and pump. A charger for the continuous glucose monitor (CGM) receiver and pump. Any extra supplies needed for your CGM or pump. A supply of  glucagon, glucose tablets, juice, soda, or hard candy in case of hypoglycemia. Coolers or cold packs. A safe container for syringes, needles, and lancets.  Other preparations Other things to consider doing as part of your emergency plan: Make sure that your mobile phone is charged and that you have an extra charger, cable, or batteries. Choose a meeting place for family members. Wear a medical alert or ID bracelet. If you have a child with diabetes, make sure your child's school has a copy of his or her emergency plan, including the name of the staff member who will assist your child. Where to find more information American Diabetes Association: www.diabetes.org Centers for Disease Control and Prevention: blogs.[employer].com.cy Summary A diabetes emergency preparedness plan is a checklist to make sure you have everything you need in case of an emergency. Your kit should include important information and documents as well as all the supplies you will need to manage your condition for at least 1 week. Store your kit in a portable, Engineer, materials. The best time to start making your emergency kit is now. This information is not intended to replace advice given to you by your health care provider. Make sure you discuss any questions you have with your health care provider. Document Revised: 06/07/2020 Document Reviewed: 06/07/2020 Elsevier Patient Education  [employer].

## 2022-01-14 DIAGNOSIS — E1122 Type 2 diabetes mellitus with diabetic chronic kidney disease: Secondary | ICD-10-CM | POA: Diagnosis not present

## 2022-01-14 DIAGNOSIS — Z905 Acquired absence of kidney: Secondary | ICD-10-CM | POA: Diagnosis not present

## 2022-01-14 DIAGNOSIS — I129 Hypertensive chronic kidney disease with stage 1 through stage 4 chronic kidney disease, or unspecified chronic kidney disease: Secondary | ICD-10-CM | POA: Diagnosis not present

## 2022-01-14 DIAGNOSIS — N189 Chronic kidney disease, unspecified: Secondary | ICD-10-CM | POA: Diagnosis not present

## 2022-01-14 DIAGNOSIS — T452X1D Poisoning by vitamins, accidental (unintentional), subsequent encounter: Secondary | ICD-10-CM | POA: Diagnosis not present

## 2022-02-18 ENCOUNTER — Ambulatory Visit: Payer: Medicare Other | Admitting: Nurse Practitioner

## 2022-02-25 ENCOUNTER — Ambulatory Visit: Payer: Medicare Other | Admitting: Nurse Practitioner

## 2022-03-06 DIAGNOSIS — H25811 Combined forms of age-related cataract, right eye: Secondary | ICD-10-CM | POA: Diagnosis not present

## 2022-03-10 DIAGNOSIS — E1151 Type 2 diabetes mellitus with diabetic peripheral angiopathy without gangrene: Secondary | ICD-10-CM | POA: Diagnosis not present

## 2022-03-10 DIAGNOSIS — E1140 Type 2 diabetes mellitus with diabetic neuropathy, unspecified: Secondary | ICD-10-CM | POA: Diagnosis not present

## 2022-04-04 DIAGNOSIS — H25812 Combined forms of age-related cataract, left eye: Secondary | ICD-10-CM | POA: Diagnosis not present

## 2022-04-07 ENCOUNTER — Other Ambulatory Visit (HOSPITAL_COMMUNITY)
Admission: RE | Admit: 2022-04-07 | Discharge: 2022-04-07 | Disposition: A | Discharge status: Home / Self Care | Payer: Medicare Other | Source: Ambulatory Visit | Attending: Family Medicine | Admitting: Family Medicine

## 2022-04-07 DIAGNOSIS — E1121 Type 2 diabetes mellitus with diabetic nephropathy: Secondary | ICD-10-CM | POA: Diagnosis not present

## 2022-04-07 LAB — CBC WITH DIFFERENTIAL/PLATELET
Abs Immature Granulocytes: 0.02 10*3/uL (ref 0.00–0.07)
Basophils Absolute: 0 10*3/uL (ref 0.0–0.1)
Basophils Relative: 1 %
Eosinophils Absolute: 0.2 10*3/uL (ref 0.0–0.5)
Eosinophils Relative: 3 %
HCT: 38.5 % (ref 36.0–46.0)
Hemoglobin: 12.7 g/dL (ref 12.0–15.0)
Immature Granulocytes: 0 %
Lymphocytes Relative: 31 %
Lymphs Abs: 2.1 10*3/uL (ref 0.7–4.0)
MCH: 31.2 pg (ref 26.0–34.0)
MCHC: 33 g/dL (ref 30.0–36.0)
MCV: 94.6 fL (ref 80.0–100.0)
Monocytes Absolute: 0.3 10*3/uL (ref 0.1–1.0)
Monocytes Relative: 5 %
Neutro Abs: 4.1 10*3/uL (ref 1.7–7.7)
Neutrophils Relative %: 60 %
Platelets: 221 10*3/uL (ref 150–400)
RBC: 4.07 MIL/uL (ref 3.87–5.11)
RDW: 11.9 % (ref 11.5–15.5)
WBC: 6.7 10*3/uL (ref 4.0–10.5)
nRBC: 0 % (ref 0.0–0.2)

## 2022-04-07 LAB — HEMOGLOBIN A1C
Hgb A1c MFr Bld: 6.6 % — ABNORMAL HIGH (ref 4.8–5.6)
Mean Plasma Glucose: 142.72 mg/dL

## 2022-04-07 LAB — COMPREHENSIVE METABOLIC PANEL
ALT: 30 U/L (ref 0–44)
AST: 23 U/L (ref 15–41)
Albumin: 4 g/dL (ref 3.5–5.0)
Alkaline Phosphatase: 64 U/L (ref 38–126)
Anion gap: 6 (ref 5–15)
BUN: 29 mg/dL — ABNORMAL HIGH (ref 8–23)
CO2: 25 mmol/L (ref 22–32)
Calcium: 9.5 mg/dL (ref 8.9–10.3)
Chloride: 108 mmol/L (ref 98–111)
Creatinine, Ser: 1.28 mg/dL — ABNORMAL HIGH (ref 0.44–1.00)
GFR, Estimated: 42 mL/min — ABNORMAL LOW (ref >60)
Glucose, Bld: 146 mg/dL — ABNORMAL HIGH (ref 70–99)
Potassium: 5.2 mmol/L — ABNORMAL HIGH (ref 3.5–5.1)
Sodium: 139 mmol/L (ref 135–145)
Total Bilirubin: 0.6 mg/dL (ref 0.3–1.2)
Total Protein: 7.8 g/dL (ref 6.5–8.1)

## 2022-04-07 LAB — LIPID PANEL
Cholesterol: 90 mg/dL (ref 0–200)
HDL: 36 mg/dL — ABNORMAL LOW (ref >40)
LDL Cholesterol: 30 mg/dL (ref 0–99)
Total CHOL/HDL Ratio: 2.5 RATIO
Triglycerides: 120 mg/dL (ref <150)
VLDL: 24 mg/dL (ref 0–40)

## 2022-04-11 ENCOUNTER — Encounter: Payer: Self-pay | Admitting: Nurse Practitioner

## 2022-04-11 ENCOUNTER — Ambulatory Visit (INDEPENDENT_AMBULATORY_CARE_PROVIDER_SITE_OTHER): Payer: Medicare Other | Admitting: Nurse Practitioner

## 2022-04-11 VITALS — BP 126/73 | HR 67 | Ht 59.0 in | Wt 146.0 lb

## 2022-04-11 DIAGNOSIS — I119 Hypertensive heart disease without heart failure: Secondary | ICD-10-CM | POA: Diagnosis not present

## 2022-04-11 DIAGNOSIS — E1121 Type 2 diabetes mellitus with diabetic nephropathy: Secondary | ICD-10-CM

## 2022-04-11 DIAGNOSIS — E782 Mixed hyperlipidemia: Secondary | ICD-10-CM

## 2022-04-11 DIAGNOSIS — N1832 Chronic kidney disease, stage 3b: Secondary | ICD-10-CM | POA: Diagnosis not present

## 2022-04-11 NOTE — Progress Notes (Signed)
? ?  [name] [name]     MRN: [id number]      DOB: 07-Dec-1941 ? ? ?HPI ?[name] [name] with past medical history of TIA, hypertensive heart disease without heart failure, GERD, type 2 diabetes with diabetic nephropathy, CKD stage IIIb, ventral hernia without obstruction or gangrene, major depressive disorder is here for follow up and re-evaluation of chronic medical conditions, medication management and review of any available recent lab. ? ? ?Patient stated that she recently had cataract removal surgery states that she can seeing  better since having her procedure done has upcoming follow-up visit with her ophthalmologist ? ? ?The PT denies any adverse reactions to current medications since the last visit.  ?There are no new concerns.  ?There are no specific complaints  ? ? ?ROS ?Denies recent fever or chills. ?Denies sinus pressure, nasal congestion, ear pain or sore throat. ?Denies chest congestion, productive cough or wheezing. ?Denies chest pains, palpitations and leg swelling ?Denies , nausea, vomiting,diarrhea or constipation.   ?Denies dysuria, frequency, hesitancy or incontinence. ?Denies joint pain, swelling and limitation in mobility. ?Denies depression, anxiety or insomnia. ? ? ? ?PE ? ?BP 126/73 (BP Location: Right Arm, Patient Position: Sitting, Cuff Size: Large)   Pulse 67   Ht _0  (1.499 m)   Wt 146 lb (66.2 kg)   SpO2 97%   BMI 29.49 kg/m?  ? ?Patient alert and oriented and in no cardiopulmonary distress. ? ?Chest: Clear to auscultation bilaterally. ? ?CVS: S1, S2 no murmurs, no S3.Regular rate. ? ?ABD: Soft, hernia.   ? ?Ext: No edema ? ?MS: Adequate ROM spine, shoulders, hips and knees. ? ?Psych: Good eye contact, normal affect. Memory intact not anxious or depressed appearing. ? ? ? ? ?Assessment & Plan ?Type 2 diabetes mellitus with diabetic nephropathy ([place]) ?Lab Results  ?Component Value Date  ? HGBA1C 6.6 (H) 04/07/2022  ?Stable chronic disease ?On metformin 500 mg daily, Tradjenta 5 mg  daily.,  Glipizide 5 mg daily ?Managed by endocrinology. ?Not on ACE/ARB ?On atorvastatin 40 mg daily ?Foot exam completed today ?Up-to-date with diabetic eye exam. ?Check urine creatinine level ?Patient encouraged to maintain close follow-up with endocrinology she verbalized understanding ? ? ?Hypertensive heart disease without heart failure ?BP Readings from Last 3 Encounters:  ?04/11/22 126/73  ?01/06/22 (!) 152/68  ?01/02/22 118/76  ?bp well controlled , not on medications.  ?DASH diet advised, exercises at least 150 minutes weekly as tolerated  ? ?Mixed hyperlipidemia ?Lab Results  ?Component Value Date  ? CHOL 90 04/07/2022  ? HDL 36 (L) 04/07/2022  ? [place] 30 04/07/2022  ? TRIG 120 04/07/2022  ? CHOLHDL 2.5 04/07/2022  ?Chronic condition well-controlled on atorvastatin 40 mg daily ?Continue current medication ? ?Chronic kidney disease, stage 3b ([place]) ?Lab Results  ?Component Value Date  ? NA 139 04/07/2022  ? K 5.2 (H) 04/07/2022  ? CO2 25 04/07/2022  ? GLUCOSE 146 (H) 04/07/2022  ? BUN 29 (H) 04/07/2022  ? CREATININE 1.28 (H) 04/07/2022  ? CALCIUM 9.5 04/07/2022  ? EGFR 39 (L) 11/21/2021  ? GFRNONAA 42 (L) 04/07/2022  ?Stable chronic disease, not on ACE/ARB ?Avoid ibuprofen, Aleve drink at least 64 ounces of water daily ?Patient encouraged to maintain close follow-up with nephrology  ? ?

## 2022-04-11 NOTE — Assessment & Plan Note (Addendum)
BP Readings from Last 3 Encounters:  ?04/11/22 126/73  ?01/06/22 (!) 152/68  ?01/02/22 118/76  ?bp well controlled , not on medications.  ?DASH diet advised, exercises at least 150 minutes weekly as tolerated  ?

## 2022-04-11 NOTE — Assessment & Plan Note (Addendum)
Lab Results  ?Component Value Date  ? HGBA1C 6.6 (H) 04/07/2022  ?Stable chronic disease ?On metformin 500 mg daily, Tradjenta 5 mg daily.,  Glipizide 5 mg daily ?Managed by endocrinology. ?Not on ACE/ARB ?On atorvastatin 40 mg daily ?Foot exam completed today ?Up-to-date with diabetic eye exam. ?Check urine creatinine level ?Patient encouraged to maintain close follow-up with endocrinology she verbalized understanding ? ?

## 2022-04-11 NOTE — Assessment & Plan Note (Addendum)
Lab Results  ?Component Value Date  ? NA 139 04/07/2022  ? K 5.2 (H) 04/07/2022  ? CO2 25 04/07/2022  ? GLUCOSE 146 (H) 04/07/2022  ? BUN 29 (H) 04/07/2022  ? CREATININE 1.28 (H) 04/07/2022  ? CALCIUM 9.5 04/07/2022  ? EGFR 39 (L) 11/21/2021  ? GFRNONAA 42 (L) 04/07/2022  ?Stable chronic disease, not on ACE/ARB ?Avoid ibuprofen, Aleve drink at least 64 ounces of water daily ?Patient encouraged to maintain close follow-up with nephrology ?

## 2022-04-11 NOTE — Patient Instructions (Signed)
Please shingles vaccine and TDAP vaccine at your pharmacy  ? ?It is important that you exercise regularly at least 30 minutes 5 times a week.  ?Think about what you will eat, plan ahead. ?Choose " clean, green, fresh or frozen" over canned, processed or packaged foods which are more sugary, salty and fatty. ?70 to 75% of food eaten should be vegetables and fruit. ?Three meals at set times with snacks allowed between meals, but they must be fruit or vegetables. ?Aim to eat over a 12 hour period , example 7 am to 7 pm, and STOP after  your last meal of the day. ?Drink water,generally about 64 ounces per day, no other drink is as healthy. Fruit juice is best enjoyed in a healthy way, by EATING the fruit. ? ?Thanks for choosing [facility], we consider it a privelige to serve you. ? ?

## 2022-04-11 NOTE — Assessment & Plan Note (Addendum)
Lab Results  ?Component Value Date  ? CHOL 90 04/07/2022  ? HDL 36 (L) 04/07/2022  ? LDLCALC 30 04/07/2022  ? TRIG 120 04/07/2022  ? CHOLHDL 2.5 04/07/2022  ?Chronic condition well-controlled on atorvastatin 40 mg daily ?Continue current medication ?

## 2022-04-13 LAB — MICROALBUMIN / CREATININE URINE RATIO
Creatinine, Urine: 106.8 mg/dL
Microalb/Creat Ratio: 7 mg/g creat (ref 0–29)
Microalbumin, Urine: 8 ug/mL

## 2022-04-14 NOTE — Progress Notes (Signed)
Normal urine creatinine ratio.

## 2022-05-06 ENCOUNTER — Ambulatory Visit (INDEPENDENT_AMBULATORY_CARE_PROVIDER_SITE_OTHER): Payer: Medicare Other | Admitting: Nurse Practitioner

## 2022-05-06 ENCOUNTER — Encounter: Payer: Self-pay | Admitting: Nurse Practitioner

## 2022-05-06 VITALS — BP 136/68 | HR 60 | Ht 59.0 in | Wt 153.0 lb

## 2022-05-06 DIAGNOSIS — E1121 Type 2 diabetes mellitus with diabetic nephropathy: Secondary | ICD-10-CM | POA: Diagnosis not present

## 2022-05-06 DIAGNOSIS — E559 Vitamin D deficiency, unspecified: Secondary | ICD-10-CM | POA: Diagnosis not present

## 2022-05-06 DIAGNOSIS — I10 Essential (primary) hypertension: Secondary | ICD-10-CM

## 2022-05-06 DIAGNOSIS — E782 Mixed hyperlipidemia: Secondary | ICD-10-CM | POA: Diagnosis not present

## 2022-05-06 DIAGNOSIS — Z905 Acquired absence of kidney: Secondary | ICD-10-CM

## 2022-05-06 NOTE — Progress Notes (Signed)
Endocrinology Follow Up Note       05/06/2022, 10:30 AM   Subjective:    Patient ID: [name] [name], female    DOB: 12-Mar-1941.  [name] [name] is being seen in follow up after being seen in consultation for management of currently uncontrolled symptomatic diabetes requested by  [name], FNP.   Past Medical History:  Diagnosis Date   Anxiety    CAD (coronary artery disease)    Multivessel status post CABG June 2021 - LIMA to LAD, SVG to distal RCA, SVG to OM1   Chronic obstructive pulmonary disease, unspecified ([place])    Depression    Essential hypertension    GERD    History of pneumonia    Incarcerated ventral hernia    Osteoarthritis    Type 2 diabetes mellitus ([place])     Past Surgical History:  Procedure Laterality Date   CATARACT EXTRACTION Bilateral 2023   CORONARY ARTERY BYPASS GRAFT N/A 05/22/2020   Procedure: CORONARY ARTERY BYPASS GRAFTING (CABG) x3 LIMA TO LAD, SVG TO OM1, SVG TO DISTAL RIGHT;  Surgeon: Lajuana Matte, MD;  Location: [facility];  Service: Open Heart Surgery;  Laterality: N/A;   LEFT HEART CATH AND CORONARY ANGIOGRAPHY N/A 05/21/2020   Procedure: LEFT HEART CATH AND CORONARY ANGIOGRAPHY;  Surgeon: Martinique, Peter M, MD;  Location: [facility];  Service: Cardiovascular;  Laterality: N/A;   NEPHRECTOMY     TEE WITHOUT CARDIOVERSION N/A 05/22/2020   Procedure: TRANSESOPHAGEAL ECHOCARDIOGRAM (TEE);  Surgeon: Lajuana Matte, MD;  Location: [facility];  Service: Open Heart Surgery;  Laterality: N/A;   VEIN HARVEST Right 05/22/2020   Procedure: Open Vein Harvest;  Surgeon: Lajuana Matte, MD;  Location: [facility];  Service: Open Heart Surgery;  Laterality: Right;   VENTRAL HERNIA REPAIR  09/01/2017   mistaken entry    Social History   Socioeconomic History   Marital status: Widowed    Spouse name: Not on file   Number of children: 1   Years of education: 36    Highest education level: 12th grade  Occupational History   Occupation: Retired  Tobacco Use   Smoking status: Never   Smokeless tobacco: Never  Vaping Use   Vaping Use: Never used  Substance and Sexual Activity   Alcohol use: No   Drug use: No   Sexual activity: Not Currently  Other Topics Concern   Not on file  Social History Narrative   Lives alone.  Has 1 daughter      Enjoys reading and cooking      Eats all food groups   Caffeine, some coffee   Water reports drinking pretty well throughout the day      Social Determinants of Health   Financial Resource Strain: Low Risk    Difficulty of Paying Living Expenses: Not very hard  Food Insecurity: No Food Insecurity   Worried About Charity fundraiser in the Last Year: Never true   Ran Out of Food in the Last Year: Never true  Transportation Needs: No Transportation Needs   Lack of Transportation (Medical): No   Lack of Transportation (Non-Medical): No  Physical Activity: Insufficiently  Active   Days of Exercise per Week: 3 days   Minutes of Exercise per Session: 20 min  Stress: No Stress Concern Present   Feeling of Stress : Only a little  Social Connections: Moderately Integrated   Frequency of Communication with Friends and Family: More than three times a week   Frequency of Social Gatherings with Friends and Family: More than three times a week   Attends Religious Services: More than 4 times per year   Active Member of [employer] or Organizations: Yes   Attends Archivist Meetings: Never   Marital Status: Widowed    Family History  Problem Relation Age of Onset   CAD Father    Heart failure Sister    CAD Daughter    Heart disease Daughter     Outpatient Encounter Medications as of 05/06/2022  Medication Sig   Accu-Chek Softclix Lancets lancets Use as instructed   aspirin EC 81 MG tablet Take 1 tablet (81 mg total) by mouth daily. Swallow whole.   atorvastatin (LIPITOR) 40 MG tablet TAKE 1 TABLET BY  MOUTH AT BEDTIME.   Biotin w/ Vitamins C & E (HAIR SKIN & NAILS GUMMIES PO) Take 2 each by mouth every morning.   blood glucose meter kit and supplies Dispense based on patient and insurance preference. Use up to four times daily as directed. (FOR ICD-10 E10.9, E11.9).   cetirizine (ZYRTEC) 10 MG tablet Take 1 tablet (10 mg total) by mouth daily as needed (allergies).   famotidine (PEPCID) 20 MG tablet Take 1 tablet (20 mg total) by mouth daily.   fish oil-omega-3 fatty acids 1000 MG capsule Take 1 g by mouth 2 (two) times daily.   glipiZIDE (GLUCOTROL XL) 5 MG 24 hr tablet Take 1 tablet (5 mg total) by mouth daily with breakfast.   glucose blood (ACCU-CHEK GUIDE) test strip Use as instructed to monitor glucose twice daily   glucose blood test strip Use as instructed to monitor glucose twice daily   linagliptin (TRADJENTA) 5 MG TABS tablet Take 1 tablet (5 mg total) by mouth daily.   metFORMIN (GLUCOPHAGE) 500 MG tablet Take 1 tablet (500 mg total) by mouth daily with breakfast.   Multiple Vitamin (MULITIVITAMIN WITH MINERALS) TABS Take 1 tablet by mouth daily.   nitroGLYCERIN (NITROSTAT) 0.4 MG SL tablet Place 1 tablet (0.4 mg total) under the tongue every 5 (five) minutes x 3 doses as needed for chest pain (if no relief after 3rd dose, proceed to the ED or call 911).   ofloxacin (OCUFLOX) 0.3 % ophthalmic solution    polyethylene glycol (MIRALAX / GLYCOLAX) 17 g packet Take 17 g by mouth daily as needed for mild constipation or moderate constipation.   prednisoLONE acetate (PRED FORTE) 1 % ophthalmic suspension SMARTSIG:In Eye(s)   [DISCONTINUED] metoprolol tartrate (LOPRESSOR) 25 MG tablet Take 0.5 tablets (12.5 mg total) by mouth 2 (two) times daily.   No facility-administered encounter medications on file as of 05/06/2022.    ALLERGIES: Allergies  Allergen Reactions   Iodinated Contrast Media Other (See Comments)    Unknown reaction. Due to kidney issues. Do not use Unknown reaction.  Due to kidney issues. Do not use   Codeine Rash   Morphine And Related Other (See Comments) and Rash    Unknown Reaction Unknown Reaction   Other     Pt has multiple allergies per pt but unsure of all.    Penicillins Other (See Comments)    Unknown reaction Unknown reaction  VACCINATION STATUS: Immunization History  Administered Date(s) Administered   Influenza, High Dose Seasonal PF 11/12/2017, 08/13/2018   Influenza-Unspecified 11/12/2011, 11/03/2012, 11/02/2013, 09/20/2014, 09/19/2015, 09/23/2016, 08/18/2019   Pneumococcal Conjugate-13 09/20/2014   Pneumococcal Polysaccharide-23 03/18/2016    Diabetes She presents for her follow-up diabetic visit. She has type 2 diabetes mellitus. Onset time: Was diagnosed at approx age of 71. Her disease course has been stable. There are no hypoglycemic associated symptoms. Associated symptoms include blurred vision and foot paresthesias. Pertinent negatives for diabetes include no fatigue, no polydipsia and no polyuria. There are no hypoglycemic complications. Symptoms are stable. Diabetic complications include heart disease, nephropathy and peripheral neuropathy. Risk factors for coronary artery disease include diabetes mellitus, dyslipidemia, hypertension, stress, tobacco exposure, sedentary lifestyle and post-menopausal. Current diabetic treatment includes oral agent (triple therapy). She is compliant with treatment most of the time. Her weight is fluctuating minimally. She is following a generally healthy diet. When asked about meal planning, she reported none. She has not had a previous visit with a dietitian. She rarely participates in exercise. Her home blood glucose trend is fluctuating minimally. Her breakfast blood glucose range is generally 90-110 mg/dl. Her bedtime blood glucose range is generally 110-130 mg/dl. (She presents today with her meter and logs showing stable, at goal glycemic profile overall.  Her most recent A1c on 04/07/22 was  6.6%, increasing from last visit of 5.8%, but still an acceptable target for her.  She denies any significant hypoglycemia.) An ACE inhibitor/angiotensin II receptor blocker is not being taken. She does not see a podiatrist.Eye exam is current.  Hyperlipidemia This is a chronic problem. The current episode started more than 1 year ago. The problem is uncontrolled. Recent lipid tests were reviewed and are variable. Exacerbating diseases include chronic renal disease and diabetes. Factors aggravating her hyperlipidemia include fatty foods and beta blockers. Current antihyperlipidemic treatment includes statins. The current treatment provides mild improvement of lipids. Compliance problems include adherence to diet and adherence to exercise.  Risk factors for coronary artery disease include diabetes mellitus, dyslipidemia, hypertension, post-menopausal, a sedentary lifestyle and stress.  Hypertension This is a chronic problem. The current episode started more than 1 year ago. The problem has been waxing and waning since onset. The problem is controlled. Associated symptoms include blurred vision. There are no associated agents to hypertension. Risk factors for coronary artery disease include diabetes mellitus, dyslipidemia, sedentary lifestyle, smoking/tobacco exposure, stress and post-menopausal state. Past treatments include beta blockers. The current treatment provides mild improvement. There are no compliance problems.  Hypertensive end-organ damage includes kidney disease and CAD/MI. Identifiable causes of hypertension include chronic renal disease.    Review of systems  Constitutional: + Minimally fluctuating body weight,  current Body mass index is 30.9 kg/m. , no fatigue, no subjective hyperthermia, no subjective hypothermia Eyes: no blurry vision, no xerophthalmia ENT: no sore throat, no nodules palpated in throat, no dysphagia/odynophagia, no hoarseness Cardiovascular: no chest pain, no shortness  of breath, no palpitations, no leg swelling Respiratory: no cough, no shortness of breath Gastrointestinal: no nausea/vomiting/diarrhea Musculoskeletal: no muscle/joint aches Skin: no rashes, no hyperemia Neurological: no tremors, no numbness, no tingling, no dizziness Psychiatric: no depression, no anxiety  Objective:     BP 136/68   Pulse 60   Ht 4' 11"  (1.499 m)   Wt 153 lb (69.4 kg)   BMI 30.90 kg/m   Wt Readings from Last 3 Encounters:  05/06/22 153 lb (69.4 kg)  04/11/22 146 lb (66.2 kg)  01/06/22 150  lb (68 kg)     BP Readings from Last 3 Encounters:  05/06/22 136/68  04/11/22 126/73  01/06/22 (!) 152/68      Physical Exam- Limited  Constitutional:  Body mass index is 30.9 kg/m. , not in acute distress, anxious state of mind Eyes:  EOMI, no exophthalmos Neck: Supple Cardiovascular: RRR, no murmurs, rubs, or gallops, no edema Respiratory: Adequate breathing efforts, no crackles, rales, rhonchi, or wheezing Musculoskeletal: she does have abnormal bulge in LLQ of abdomen (unknown etiology), strength intact in all four extremities, no gross restriction of joint movements Skin:  no rashes, no hyperemia Neurological: no tremor with outstretched hands   CMP ( most recent) CMP     Component Value Date/Time   NA 139 04/07/2022 1226   NA 141 11/21/2021 1115   K 5.2 (H) 04/07/2022 1226   CL 108 04/07/2022 1226   CO2 25 04/07/2022 1226   GLUCOSE 146 (H) 04/07/2022 1226   BUN 29 (H) 04/07/2022 1226   BUN 27 11/21/2021 1115   CREATININE 1.28 (H) 04/07/2022 1226   CREATININE 1.28 (H) 03/07/2020 1137   CALCIUM 9.5 04/07/2022 1226   PROT 7.8 04/07/2022 1226   PROT 6.8 11/21/2021 1115   ALBUMIN 4.0 04/07/2022 1226   ALBUMIN 4.5 11/21/2021 1115   AST 23 04/07/2022 1226   ALT 30 04/07/2022 1226   ALKPHOS 64 04/07/2022 1226   BILITOT 0.6 04/07/2022 1226   BILITOT 0.3 11/21/2021 1115   GFRNONAA 42 (L) 04/07/2022 1226   GFRNONAA 40 (L) 03/07/2020 1137   GFRAA 48  (L) 12/28/2020 0955   GFRAA 46 (L) 03/07/2020 1137     Diabetic Labs (most recent): Lab Results  Component Value Date   HGBA1C 6.6 (H) 04/07/2022   HGBA1C 5.8 01/06/2022   HGBA1C 6.2 (H) 06/30/2021     Lipid Panel ( most recent) Lipid Panel     Component Value Date/Time   CHOL 90 04/07/2022 1226   CHOL 121 11/21/2021 1115   TRIG 120 04/07/2022 1226   HDL 36 (L) 04/07/2022 1226   HDL 39 (L) 11/21/2021 1115   CHOLHDL 2.5 04/07/2022 1226   VLDL 24 04/07/2022 1226   LDLCALC 30 04/07/2022 1226   LDLCALC 55 11/21/2021 1115   LDLCALC 109 (H) 03/07/2020 1137   LABVLDL 27 11/21/2021 1115      Lab Results  Component Value Date   TSH 3.040 08/22/2021   TSH 2.02 11/17/2019   FREET4 1.25 08/22/2021           Assessment & Plan:   1) Uncontrolled Type 2 Diabetes with CKD stage 3  - ADRIANE GABBERT has currently uncontrolled symptomatic type 2 DM since 81 years of age.  She presents today with her meter and logs showing stable, at goal glycemic profile overall.  Her most recent A1c on 04/07/22 was 6.6%, increasing from last visit of 5.8%, but still an acceptable target for her.  She denies any significant hypoglycemia.  -Recent labs reviewed.  - I had a long discussion with her about the progressive nature of diabetes and the pathology behind its complications. -her diabetes is complicated by CAD and she remains at a high risk for more acute and chronic complications which include CAD, CVA, CKD, retinopathy, and neuropathy. These are all discussed in detail with her.  - Nutritional counseling repeated at each appointment due to patients tendency to fall back in to old habits.  - The patient admits there is a room for improvement in their  diet and drink choices. -  Suggestion is made for the patient to avoid simple carbohydrates from their diet including Cakes, Sweet Desserts / Pastries, Ice Cream, Soda (diet and regular), Sweet Tea, Candies, Chips, Cookies, Sweet Pastries,  Store Bought Juices, Alcohol in Excess of 1-2 drinks a day, Artificial Sweeteners, Coffee Creamer, and "Sugar-free" Products. This will help patient to have stable blood glucose profile and potentially avoid unintended weight gain.   - I encouraged the patient to switch to unprocessed or minimally processed complex starch and increased protein intake (animal or plant source), fruits, and vegetables.   - Patient is advised to stick to a routine mealtimes to eat 3 meals a day and avoid unnecessary snacks (to snack only to correct hypoglycemia).  - she will be scheduled with Jearld Fenton, RDN, CDE for diabetes education.  - I have approached her with the following individualized plan to manage  her diabetes and patient agrees:   Given her stable, at goal glycemic profile, she can continue her current medication regimen of Metformin 500 mg po once daily with breakfast, Tradjenta 5 mg po daily with breakfast, and Glipizide 5 mg XL daily with breakfast.   -she is encouraged to continue monitoring blood glucose twice daily, before breakfast and before bed, and to call the clinic if she has readings less than 70 or greater than 200 for 3 day out of the week.  - Specific targets for  A1c;  LDL, HDL,  and Triglycerides were discussed with the patient.  2) Blood Pressure /Hypertension:  her blood pressure is controlled to target for her age.   she is not currently on any antihypertensive medications at this time.  3) Lipids/Hyperlipidemia:    Review of her recent lipid panel from 04/07/22 showed controlled  LDL at 30.  she  is advised to continue Lipitor 40 mg daily at bedtime and Fish Oil supplement.  Side effects and precautions discussed with her.    4)  Weight/Diet:  her Body mass index is 30.9 kg/m.  -  clearly complicating her diabetes care.   she is a candidate for weight loss. I discussed with her the fact that loss of 5 - 10% of her  current body weight will have the most impact on her  diabetes management.  Exercise, and detailed carbohydrates information provided  -  detailed on discharge instructions.  5) Hypervitaminosis of vitamin D Her most recent vitamin D level on 08/22/21 was high at 113.  She was taking several supplements that contained vitamin D in it.  The nephrologist asked her to stop all vitamin D supplements at this time.  6) Chronic Care/Health Maintenance: -she is on Statin medications and is encouraged to initiate and continue to follow up with Ophthalmology, Dentist, Podiatrist at least yearly or according to recommendations, and advised to stay away from smoking. I have recommended yearly flu vaccine and pneumonia vaccine at least every 5 years; moderate intensity exercise for up to 150 minutes weekly; and sleep for at least 7 hours a day.  - she is advised to maintain close follow up with [name], FNP for primary care needs, as well as her other providers for optimal and coordinated care.     I spent 30 minutes in the care of the patient today including review of labs from [facility], Lipids, Thyroid Function, Hematology (current and previous including abstractions from other facilities); face-to-face time discussing  her blood glucose readings/logs, discussing hypoglycemia and hyperglycemia episodes and symptoms, medications doses,  her options of short and long term treatment based on the latest standards of care / guidelines;  discussion about incorporating lifestyle medicine;  and documenting the encounter.    Please refer to Patient Instructions for Blood Glucose Monitoring and Insulin/Medications Dosing Guide"  in media tab for additional information. Please  also refer to " Patient Self Inventory" in the Media  tab for reviewed elements of pertinent patient history.  Judee Clara Masella participated in the discussions, expressed understanding, and voiced agreement with the above plans.  All questions were answered to her satisfaction. she is encouraged to  contact clinic should she have any questions or concerns prior to her return visit.   Follow up plan: - Return in about 4 months (around 09/06/2022) for Diabetes F/U with A1c in office, No previsit labs, Bring meter and logs.  Rayetta Pigg, [facility] [address] [place], [place] [address] Phone: [phone] Fax: [phone]  05/06/2022, 10:30 AM

## 2022-05-06 NOTE — Patient Instructions (Signed)
Diabetes Mellitus and Foot Care Foot care is an important part of your health, especially when you have diabetes. Diabetes may cause you to have problems because of poor blood flow (circulation) to your feet and legs, which can cause your skin to: Become thinner and drier. Break more easily. Heal more slowly. Peel and crack. You may also have nerve damage (neuropathy) in your legs and feet, causing decreased feeling in them. This means that you may not notice minor injuries to your feet that could lead to more serious problems. Noticing and addressing any potential problems early is the best way to prevent future foot problems. How to care for your feet Foot hygiene  Wash your feet daily with warm water and mild soap. Do not use hot water. Then, pat your feet and the areas between your toes until they are completely dry. Do not soak your feet as this can dry your skin. Trim your toenails straight across. Do not dig under them or around the cuticle. File the edges of your nails with an emery board or nail file. Apply a moisturizing lotion or petroleum jelly to the skin on your feet and to dry, brittle toenails. Use lotion that does not contain alcohol and is unscented. Do not apply lotion between your toes. Shoes and socks Wear clean socks or stockings every day. Make sure they are not too tight. Do not wear knee-high stockings since they may decrease blood flow to your legs. Wear shoes that fit properly and have enough cushioning. Always look in your shoes before you put them on to be sure there are no objects inside. To break in new shoes, wear them for just a few hours a day. This prevents injuries on your feet. Wounds, scrapes, corns, and calluses  Check your feet daily for blisters, cuts, bruises, sores, and redness. If you cannot see the bottom of your feet, use a mirror or ask someone for help. Do not cut corns or calluses or try to remove them with medicine. If you find a minor scrape,  cut, or break in the skin on your feet, keep it and the skin around it clean and dry. You may clean these areas with mild soap and water. Do not clean the area with peroxide, alcohol, or iodine. If you have a wound, scrape, corn, or callus on your foot, look at it several times a day to make sure it is healing and not infected. Check for: Redness, swelling, or pain. Fluid or blood. Warmth. Pus or a bad smell. General tips Do not cross your legs. This may decrease blood flow to your feet. Do not use heating pads or hot water bottles on your feet. They may burn your skin. If you have lost feeling in your feet or legs, you may not know this is happening until it is too late. Protect your feet from hot and cold by wearing shoes, such as at the beach or on hot pavement. Schedule a complete foot exam at least once a year (annually) or more often if you have foot problems. Report any cuts, sores, or bruises to your health care provider immediately. Where to find more information American Diabetes Association: www.diabetes.org Association of Diabetes Care & Education Specialists: www.diabeteseducator.org Contact a health care provider if: You have a medical condition that increases your risk of infection and you have any cuts, sores, or bruises on your feet. You have an injury that is not healing. You have redness on your legs or feet. You   feel burning or tingling in your legs or feet. You have pain or cramps in your legs and feet. Your legs or feet are numb. Your feet always feel cold. You have pain around any toenails. Get help right away if: You have a wound, scrape, corn, or callus on your foot and: You have pain, swelling, or redness that gets worse. You have fluid or blood coming from the wound, scrape, corn, or callus. Your wound, scrape, corn, or callus feels warm to the touch. You have pus or a bad smell coming from the wound, scrape, corn, or callus. You have a fever. You have a red  line going up your leg. Summary Check your feet every day for blisters, cuts, bruises, sores, and redness. Apply a moisturizing lotion or petroleum jelly to the skin on your feet and to dry, brittle toenails. Wear shoes that fit properly and have enough cushioning. If you have foot problems, report any cuts, sores, or bruises to your health care provider immediately. Schedule a complete foot exam at least once a year (annually) or more often if you have foot problems. This information is not intended to replace advice given to you by your health care provider. Make sure you discuss any questions you have with your health care provider. Document Revised: 06/21/2020 Document Reviewed: 06/21/2020 Elsevier Patient Education  2023 Elsevier Inc.  

## 2022-05-15 DIAGNOSIS — Z905 Acquired absence of kidney: Secondary | ICD-10-CM | POA: Diagnosis not present

## 2022-05-15 DIAGNOSIS — T452X1D Poisoning by vitamins, accidental (unintentional), subsequent encounter: Secondary | ICD-10-CM | POA: Diagnosis not present

## 2022-05-15 DIAGNOSIS — N189 Chronic kidney disease, unspecified: Secondary | ICD-10-CM | POA: Diagnosis not present

## 2022-05-15 DIAGNOSIS — E559 Vitamin D deficiency, unspecified: Secondary | ICD-10-CM | POA: Diagnosis not present

## 2022-05-15 DIAGNOSIS — E1122 Type 2 diabetes mellitus with diabetic chronic kidney disease: Secondary | ICD-10-CM | POA: Diagnosis not present

## 2022-05-15 DIAGNOSIS — I129 Hypertensive chronic kidney disease with stage 1 through stage 4 chronic kidney disease, or unspecified chronic kidney disease: Secondary | ICD-10-CM | POA: Diagnosis not present

## 2022-05-20 ENCOUNTER — Telehealth: Payer: Self-pay | Admitting: Nurse Practitioner

## 2022-05-20 ENCOUNTER — Other Ambulatory Visit: Payer: Self-pay | Admitting: Nurse Practitioner

## 2022-05-20 DIAGNOSIS — E1140 Type 2 diabetes mellitus with diabetic neuropathy, unspecified: Secondary | ICD-10-CM | POA: Diagnosis not present

## 2022-05-20 DIAGNOSIS — E1151 Type 2 diabetes mellitus with diabetic peripheral angiopathy without gangrene: Secondary | ICD-10-CM | POA: Diagnosis not present

## 2022-05-20 MED ORDER — CETIRIZINE HCL 10 MG PO TABS: 10.0000 mg | ORAL_TABLET | Freq: Every day | ORAL | 3 refills | Status: DC | PRN

## 2022-05-20 NOTE — Telephone Encounter (Signed)
Pt is wanting to know if we she can get an rx for Cetirizine 10mg . Dr. used to give her this. She is needing it for when she goes outside to keep her from breaking out from poison oak & ivy. Can this be done please?   [facility]

## 2022-05-20 NOTE — Telephone Encounter (Signed)
Please advise 

## 2022-05-21 ENCOUNTER — Emergency Department (HOSPITAL_COMMUNITY): Payer: Medicare Other

## 2022-05-21 ENCOUNTER — Other Ambulatory Visit: Payer: Self-pay

## 2022-05-21 ENCOUNTER — Emergency Department (HOSPITAL_COMMUNITY)
Admission: EM | Admit: 2022-05-21 | Discharge: 2022-05-21 | Disposition: A | Discharge status: Home / Self Care | Payer: Medicare Other | Attending: Emergency Medicine | Admitting: Emergency Medicine

## 2022-05-21 ENCOUNTER — Encounter (HOSPITAL_COMMUNITY): Payer: Self-pay | Admitting: Emergency Medicine

## 2022-05-21 DIAGNOSIS — Z79899 Other long term (current) drug therapy: Secondary | ICD-10-CM | POA: Insufficient documentation

## 2022-05-21 DIAGNOSIS — Z905 Acquired absence of kidney: Secondary | ICD-10-CM | POA: Diagnosis not present

## 2022-05-21 DIAGNOSIS — J9811 Atelectasis: Secondary | ICD-10-CM | POA: Diagnosis not present

## 2022-05-21 DIAGNOSIS — Z7984 Long term (current) use of oral hypoglycemic drugs: Secondary | ICD-10-CM | POA: Diagnosis not present

## 2022-05-21 DIAGNOSIS — Z951 Presence of aortocoronary bypass graft: Secondary | ICD-10-CM | POA: Diagnosis not present

## 2022-05-21 DIAGNOSIS — E1122 Type 2 diabetes mellitus with diabetic chronic kidney disease: Secondary | ICD-10-CM | POA: Diagnosis not present

## 2022-05-21 DIAGNOSIS — R001 Bradycardia, unspecified: Secondary | ICD-10-CM

## 2022-05-21 DIAGNOSIS — T452X1D Poisoning by vitamins, accidental (unintentional), subsequent encounter: Secondary | ICD-10-CM | POA: Diagnosis not present

## 2022-05-21 DIAGNOSIS — I129 Hypertensive chronic kidney disease with stage 1 through stage 4 chronic kidney disease, or unspecified chronic kidney disease: Secondary | ICD-10-CM | POA: Diagnosis not present

## 2022-05-21 DIAGNOSIS — N189 Chronic kidney disease, unspecified: Secondary | ICD-10-CM | POA: Diagnosis not present

## 2022-05-21 DIAGNOSIS — I2510 Atherosclerotic heart disease of native coronary artery without angina pectoris: Secondary | ICD-10-CM | POA: Diagnosis not present

## 2022-05-21 DIAGNOSIS — J449 Chronic obstructive pulmonary disease, unspecified: Secondary | ICD-10-CM | POA: Insufficient documentation

## 2022-05-21 DIAGNOSIS — I441 Atrioventricular block, second degree: Secondary | ICD-10-CM | POA: Diagnosis not present

## 2022-05-21 DIAGNOSIS — Z7982 Long term (current) use of aspirin: Secondary | ICD-10-CM | POA: Diagnosis not present

## 2022-05-21 DIAGNOSIS — R079 Chest pain, unspecified: Secondary | ICD-10-CM | POA: Diagnosis not present

## 2022-05-21 DIAGNOSIS — M79601 Pain in right arm: Secondary | ICD-10-CM | POA: Diagnosis not present

## 2022-05-21 LAB — COMPREHENSIVE METABOLIC PANEL
ALT: 32 U/L (ref 0–44)
AST: 25 U/L (ref 15–41)
Albumin: 3.9 g/dL (ref 3.5–5.0)
Alkaline Phosphatase: 67 U/L (ref 38–126)
Anion gap: 6 (ref 5–15)
BUN: 22 mg/dL (ref 8–23)
CO2: 22 mmol/L (ref 22–32)
Calcium: 9.1 mg/dL (ref 8.9–10.3)
Chloride: 110 mmol/L (ref 98–111)
Creatinine, Ser: 1.16 mg/dL — ABNORMAL HIGH (ref 0.44–1.00)
GFR, Estimated: 48 mL/min — ABNORMAL LOW (ref >60)
Glucose, Bld: 124 mg/dL — ABNORMAL HIGH (ref 70–99)
Potassium: 4.5 mmol/L (ref 3.5–5.1)
Sodium: 138 mmol/L (ref 135–145)
Total Bilirubin: 0.5 mg/dL (ref 0.3–1.2)
Total Protein: 7.3 g/dL (ref 6.5–8.1)

## 2022-05-21 LAB — CBC WITH DIFFERENTIAL/PLATELET
Abs Immature Granulocytes: 0.02 10*3/uL (ref 0.00–0.07)
Basophils Absolute: 0.1 10*3/uL (ref 0.0–0.1)
Basophils Relative: 1 %
Eosinophils Absolute: 0.2 10*3/uL (ref 0.0–0.5)
Eosinophils Relative: 3 %
HCT: 34.7 % — ABNORMAL LOW (ref 36.0–46.0)
Hemoglobin: 11.7 g/dL — ABNORMAL LOW (ref 12.0–15.0)
Immature Granulocytes: 0 %
Lymphocytes Relative: 25 %
Lymphs Abs: 2.2 10*3/uL (ref 0.7–4.0)
MCH: 31.8 pg (ref 26.0–34.0)
MCHC: 33.7 g/dL (ref 30.0–36.0)
MCV: 94.3 fL (ref 80.0–100.0)
Monocytes Absolute: 0.7 10*3/uL (ref 0.1–1.0)
Monocytes Relative: 8 %
Neutro Abs: 5.4 10*3/uL (ref 1.7–7.7)
Neutrophils Relative %: 63 %
Platelets: 205 10*3/uL (ref 150–400)
RBC: 3.68 MIL/uL — ABNORMAL LOW (ref 3.87–5.11)
RDW: 12.1 % (ref 11.5–15.5)
WBC: 8.5 10*3/uL (ref 4.0–10.5)
nRBC: 0 % (ref 0.0–0.2)

## 2022-05-21 LAB — TSH: TSH: 2.469 u[IU]/mL (ref 0.350–4.500)

## 2022-05-21 LAB — TROPONIN I (HIGH SENSITIVITY): Troponin I (High Sensitivity): 6 ng/L (ref <18)

## 2022-05-21 LAB — MAGNESIUM: Magnesium: 1.9 mg/dL (ref 1.7–2.4)

## 2022-05-21 NOTE — ED Triage Notes (Signed)
Pt sent from Dr. Dwain Sarna office for possible low heart rate?, pt not very forthcoming.

## 2022-05-21 NOTE — ED Provider Notes (Signed)
[facility] Provider Note   CSN: [id number] Arrival date & time: 05/21/22  1206     History  Chief Complaint  Patient presents with   Bradycardia    [name] [name] is a 81 y.o. female.  HPI 81 year old female with a history of CAD, COPD, chronic kidney disease, type 2 diabetes and hypertension presents with bradycardia.  Patient does not know why she is here.  She went to her nephrologist today, Dr. [name], and reported that her heart rate was low.  She was told to come to the ER.  I discussed with the nurse at Dr. Toya Smothers office as he is no longer in the office and she reports that patient had a heart rate that was in the high 30s and in the 40s and she was being sent to get an EKG.  Patient denies any symptoms including no dizziness, lightheadedness, near-syncope or syncope.  No chest pain or shortness of breath.  No recent change in her medicines.  Home Medications Prior to Admission medications   Medication Sig Start Date End Date Taking? Authorizing Provider  Accu-Chek Softclix Lancets lancets Use as instructed 02/12/21   Brita Romp, NP  aspirin EC 81 MG tablet Take 1 tablet (81 mg total) by mouth daily. Swallow whole. 12/25/20   Satira Sark, MD  atorvastatin (LIPITOR) 40 MG tablet TAKE 1 TABLET BY MOUTH AT BEDTIME. 06/27/21   Verta Ellen., NP  Biotin w/ Vitamins C & E (HAIR SKIN & NAILS GUMMIES PO) Take 2 each by mouth every morning.    [provider]  blood glucose meter kit and supplies Dispense based on patient and insurance preference. Use up to four times daily as directed. (FOR ICD-10 E10.9, E11.9). 11/28/20   Perlie Mayo, NP  cetirizine (ZYRTEC) 10 MG tablet Take 1 tablet (10 mg total) by mouth daily as needed (allergies). 05/20/22   [name], FNP  famotidine (PEPCID) 20 MG tablet Take 1 tablet (20 mg total) by mouth daily. 11/27/21   [name], NP  fish oil-omega-3 fatty acids 1000 MG capsule Take 1 g by  mouth 2 (two) times daily.    [provider]  glipiZIDE (GLUCOTROL XL) 5 MG 24 hr tablet Take 1 tablet (5 mg total) by mouth daily with breakfast. 01/06/22   Brita Romp, NP  glucose blood (ACCU-CHEK GUIDE) test strip Use as instructed to monitor glucose twice daily 12/04/21   Brita Romp, NP  glucose blood test strip Use as instructed to monitor glucose twice daily 08/28/21   Brita Romp, NP  linagliptin (TRADJENTA) 5 MG TABS tablet Take 1 tablet (5 mg total) by mouth daily. 01/06/22   Brita Romp, NP  metFORMIN (GLUCOPHAGE) 500 MG tablet Take 1 tablet (500 mg total) by mouth daily with breakfast. 01/06/22   Brita Romp, NP  Multiple Vitamin (MULITIVITAMIN WITH MINERALS) TABS Take 1 tablet by mouth daily.    [provider]  nitroGLYCERIN (NITROSTAT) 0.4 MG SL tablet Place 1 tablet (0.4 mg total) under the tongue every 5 (five) minutes x 3 doses as needed for chest pain (if no relief after 3rd dose, proceed to the ED or call 911). 01/02/22   Satira Sark, MD  ofloxacin (OCUFLOX) 0.3 % ophthalmic solution  03/18/22   [provider]  polyethylene glycol (MIRALAX / GLYCOLAX) 17 g packet Take 17 g by mouth daily as needed for mild constipation or moderate constipation.  [provider]  prednisoLONE acetate (PRED FORTE) 1 % ophthalmic suspension SMARTSIG:In Eye(s) 03/18/22   [provider]  metoprolol tartrate (LOPRESSOR) 25 MG tablet Take 0.5 tablets (12.5 mg total) by mouth 2 (two) times daily. 07/03/21 07/03/21  Murlean Iba, MD      Allergies    Iodinated contrast media, Codeine, Morphine and related, Other, and Penicillins    Review of Systems   Review of Systems  Respiratory:  Negative for shortness of breath.   Cardiovascular:  Negative for chest pain.  Neurological:  Negative for dizziness, syncope and light-headedness.   Physical Exam Updated Vital Signs BP (!) 145/52   Pulse (!) 53   Temp 98.4 F  (36.9 C) (Oral)   Resp 15   Ht $R'4\' 11"'is$  (1.499 m)   Wt 69.4 kg   SpO2 98%   BMI 30.90 kg/m  Physical Exam Vitals and nursing note reviewed.  Constitutional:      Appearance: She is well-developed.  HENT:     Head: Normocephalic and atraumatic.  Cardiovascular:     Rate and Rhythm: Regular rhythm. Bradycardia present.     Heart sounds: Normal heart sounds.     Comments: HR in 50s on my exam Pulmonary:     Effort: Pulmonary effort is normal.     Breath sounds: Normal breath sounds.  Abdominal:     Tenderness: There is no abdominal tenderness.  Skin:    General: Skin is warm and dry.  Neurological:     Mental Status: She is alert.    ED Results / Procedures / Treatments   Labs (all labs ordered are listed, but only abnormal results are displayed) Labs Reviewed  COMPREHENSIVE METABOLIC PANEL - Abnormal; Notable for the following components:      Result Value   Glucose, Bld 124 (*)    Creatinine, Ser 1.16 (*)    GFR, Estimated 48 (*)    All other components within normal limits  CBC WITH DIFFERENTIAL/PLATELET - Abnormal; Notable for the following components:   RBC 3.68 (*)    Hemoglobin 11.7 (*)    HCT 34.7 (*)    All other components within normal limits  MAGNESIUM  TSH  TROPONIN I (HIGH SENSITIVITY)    EKG EKG Interpretation  Date/Time:  Wednesday May 21 2022 12:35:21 EDT Ventricular Rate:  47 PR Interval:    QRS Duration: 110 QT Interval:  437 QTC Calculation: 387 R Axis:   70 Text Interpretation: Sinus bradycardia Atrial premature complex Second deg AVB, Mobitz I (Wenckebach) Low voltage, extremity and precordial leads Probable anteroseptal infarct, old Borderline ST depression, lateral leads Confirmed by Sherwood Gambler [phone]) on 05/21/2022 2:57:23 PM  Radiology DG Chest Portable 1 View  Result Date: 05/21/2022 CLINICAL DATA:  Bradycardia.  Right arm pain. EXAM: PORTABLE CHEST 1 VIEW COMPARISON:  Chest radiograph dated June 30, 2021 FINDINGS: The heart is  mildly enlarged with evidence of prior coronary artery bypass grafting. Chronic elevation of the left hemidiaphragm with left basilar atelectasis. Lungs otherwise clear. Thoracic spondylosis. IMPRESSION: No active disease. Electronically Signed   By: Keane Police D.O.   On: 05/21/2022 13:32    Procedures Procedures    Medications Ordered in ED Medications - No data to display  ED Course/ Medical Decision Making/ A&P                           Medical Decision Making Amount and/or Complexity of Data Reviewed External  Data Reviewed: notes. Labs: ordered. Radiology: ordered and independent interpretation performed. ECG/medicine tests: ordered and independent interpretation performed.   I discussed with patient's nephrologist, Dr. [name].  He had not really intended her to come to the ER but otherwise just wanted her to get in touch with cardiology.  Otherwise patient is asymptomatic.  Heart rate is mostly in the 50s though on the cardiac monitor review every once a while will say 39 and be in the 40s as well.  ECG reviewed/interpreted and it does seem like this is probably Wenckebach though it is hard to tell.  I discussed with Dr. Johnsie Cancel, who has consulted and seen the ECG and agrees is probably Wenckebach.  Given no symptoms it would be reasonable to treat this as an outpatient with a Holter monitor and follow-up with her cardiologist, Dr. [name].  He recommends adding on TSH and troponin x1 to the labs.  Labs show normal potassium and magnesium and no AKI.  TSH and troponin are normal.  She remains asymptomatic.  She is not on any rate controlling medicines.  At this point will discharge, though I discussed return precautions with her and her daughter at the bedside.        Final Clinical Impression(s) / ED Diagnoses Final diagnoses:  AV block, 2nd degree    Rx / DC Orders ED Discharge Orders     None         Sherwood Gambler, MD 05/21/22 1459

## 2022-05-21 NOTE — Discharge Instructions (Signed)
Your heart rate is lower than typical.  At this point is not dangerous but if you develop chest pain, shortness of breath, lightheadedness, dizziness, weakness, passing out or feeling like you are going to pass out, or any other new/concerning symptoms then you need to return to the ER or call 911.  You will need a heart monitor that Dr. Ival Bible office can set up to track your heart rate and heart beating for 30 days.

## 2022-05-21 NOTE — Telephone Encounter (Signed)
Called pt to advise medication has been sent to pharmacy no answer left vm

## 2022-05-21 NOTE — Consult Note (Signed)
CARDIOLOGY CONSULT NOTE       Patient ID: [name] [name] MRN: [id number] DOB/AGE: 1941/03/14 81 y.o.  Admit date: 05/21/2022 Referring Physician: Colin Mulders Primary Physician: [name], FNP Primary Cardiologist: [name] Reason for Consultation: Bradycardia  Active Problems:   * No active hospital problems. *   HPI:  81 y.o. sent to ER by Dr [name] [name] nephrology today when found to be bradycardic during office visit. Noted HR in mid 40's. In ER patient with wenckebach and occasional periods of 2:1 block She is asymptomatic with good BP She is not on any AV nodal drugs Cr usually runs 1.5 or so She has had right nephrectomy in mid 70's  She has no syncope, chest pain, dyspnea or dizziness. Labs are pending currently She lives with daughter who does most of the driving for her.   She had CABG June 2021 with normal EF No angina Had cataract surgery earlier this year with no issues She had been on metroprolol but this was stopped last year cannot find reason but suspect it was for bradycardia  ROS All other systems reviewed and negative except as noted above  Past Medical History:  Diagnosis Date   Anxiety    CAD (coronary artery disease)    Multivessel status post CABG June 2021 - LIMA to LAD, SVG to distal RCA, SVG to OM1   Chronic obstructive pulmonary disease, unspecified ([place])    Depression    Essential hypertension    GERD    History of pneumonia    Incarcerated ventral hernia    Osteoarthritis    Type 2 diabetes mellitus ([place])     Family History  Problem Relation Age of Onset   CAD Father    Heart failure Sister    CAD Daughter    Heart disease Daughter     Social History   Socioeconomic History   Marital status: Widowed    Spouse name: Not on file   Number of children: 1   Years of education: 26   Highest education level: 12th grade  Occupational History   Occupation: Retired  Tobacco Use   Smoking status: Never   Smokeless tobacco: Never  Vaping  Use   Vaping Use: Never used  Substance and Sexual Activity   Alcohol use: No   Drug use: No   Sexual activity: Not Currently  Other Topics Concern   Not on file  Social History Narrative   Lives alone.  Has 1 daughter      Enjoys reading and cooking      Eats all food groups   Caffeine, some coffee   Water reports drinking pretty well throughout the day      Social Determinants of Health   Financial Resource Strain: Low Risk    Difficulty of Paying Living Expenses: Not very hard  Food Insecurity: No Food Insecurity   Worried About Charity fundraiser in the Last Year: Never true   Ran Out of Food in the Last Year: Never true  Transportation Needs: No Transportation Needs   Lack of Transportation (Medical): No   Lack of Transportation (Non-Medical): No  Physical Activity: Insufficiently Active   Days of Exercise per Week: 3 days   Minutes of Exercise per Session: 20 min  Stress: No Stress Concern Present   Feeling of Stress : Only a little  Social Connections: Moderately Integrated   Frequency of Communication with Friends and Family: More than three times a week   Frequency of Social  Gatherings with Friends and Family: More than three times a week   Attends Religious Services: More than 4 times per year   Active Member of Clubs or Organizations: Yes   Attends Archivist Meetings: Never   Marital Status: Widowed  Human resources officer Violence: Not At Risk   Fear of Current or Ex-Partner: No   Emotionally Abused: No   Physically Abused: No   Sexually Abused: No    Past Surgical History:  Procedure Laterality Date   CATARACT EXTRACTION Bilateral 2023   CORONARY ARTERY BYPASS GRAFT N/A 05/22/2020   Procedure: CORONARY ARTERY BYPASS GRAFTING (CABG) x3 LIMA TO LAD, SVG TO OM1, SVG TO DISTAL RIGHT;  Surgeon: Lajuana Matte, MD;  Location: [facility];  Service: Open Heart Surgery;  Laterality: N/A;   LEFT HEART CATH AND CORONARY ANGIOGRAPHY N/A 05/21/2020    Procedure: LEFT HEART CATH AND CORONARY ANGIOGRAPHY;  Surgeon: Martinique, Immaculate Crutcher M, MD;  Location: [facility];  Service: Cardiovascular;  Laterality: N/A;   NEPHRECTOMY     TEE WITHOUT CARDIOVERSION N/A 05/22/2020   Procedure: TRANSESOPHAGEAL ECHOCARDIOGRAM (TEE);  Surgeon: Lajuana Matte, MD;  Location: [facility];  Service: Open Heart Surgery;  Laterality: N/A;   VEIN HARVEST Right 05/22/2020   Procedure: Open Vein Harvest;  Surgeon: Lajuana Matte, MD;  Location: [facility];  Service: Open Heart Surgery;  Laterality: Right;   VENTRAL HERNIA REPAIR  09/01/2017   mistaken entry     No current facility-administered medications for this encounter.  Current Outpatient Medications:    Accu-Chek Softclix Lancets lancets, Use as instructed, Disp: 100 each, Rfl: 12   aspirin EC 81 MG tablet, Take 1 tablet (81 mg total) by mouth daily. Swallow whole., Disp: 90 tablet, Rfl: 3   atorvastatin (LIPITOR) 40 MG tablet, TAKE 1 TABLET BY MOUTH AT BEDTIME., Disp: 90 tablet, Rfl: 3   Biotin w/ Vitamins C & E (HAIR SKIN & NAILS GUMMIES PO), Take 2 each by mouth every morning., Disp: , Rfl:    blood glucose meter kit and supplies, Dispense based on patient and insurance preference. Use up to four times daily as directed. (FOR ICD-10 E10.9, E11.9)., Disp: 1 each, Rfl: 0   cetirizine (ZYRTEC) 10 MG tablet, Take 1 tablet (10 mg total) by mouth daily as needed (allergies)., Disp: 30 tablet, Rfl: 3   famotidine (PEPCID) 20 MG tablet, Take 1 tablet (20 mg total) by mouth daily., Disp: 60 tablet, Rfl: 1   fish oil-omega-3 fatty acids 1000 MG capsule, Take 1 g by mouth 2 (two) times daily., Disp: , Rfl:    glipiZIDE (GLUCOTROL XL) 5 MG 24 hr tablet, Take 1 tablet (5 mg total) by mouth daily with breakfast., Disp: 90 tablet, Rfl: 3   glucose blood (ACCU-CHEK GUIDE) test strip, Use as instructed to monitor glucose twice daily, Disp: 100 each, Rfl: 12   glucose blood test strip, Use as instructed to monitor glucose  twice daily, Disp: 100 each, Rfl: 1   linagliptin (TRADJENTA) 5 MG TABS tablet, Take 1 tablet (5 mg total) by mouth daily., Disp: 90 tablet, Rfl: 3   metFORMIN (GLUCOPHAGE) 500 MG tablet, Take 1 tablet (500 mg total) by mouth daily with breakfast., Disp: 90 tablet, Rfl: 3   Multiple Vitamin (MULITIVITAMIN WITH MINERALS) TABS, Take 1 tablet by mouth daily., Disp: , Rfl:    nitroGLYCERIN (NITROSTAT) 0.4 MG SL tablet, Place 1 tablet (0.4 mg total) under the tongue every 5 (five) minutes x 3 doses  as needed for chest pain (if no relief after 3rd dose, proceed to the ED or call 911)., Disp: 25 tablet, Rfl: 3   ofloxacin (OCUFLOX) 0.3 % ophthalmic solution, , Disp: , Rfl:    polyethylene glycol (MIRALAX / GLYCOLAX) 17 g packet, Take 17 g by mouth daily as needed for mild constipation or moderate constipation., Disp: , Rfl:    prednisoLONE acetate (PRED FORTE) 1 % ophthalmic suspension, SMARTSIG:In Eye(s), Disp: , Rfl:     Physical Exam: Blood pressure (!) 127/53, pulse (!) 54, temperature 98.4 F (36.9 C), temperature source Oral, resp. rate 18, height _0  (1.499 m), weight 69.4 kg, SpO2 97 %.    Elderly female Lungs clear No murmur Post sternotomy Abdomen benign large scar around to back from nephrectomy Trace edema Palpable pedal pusles  Labs:   Lab Results  Component Value Date   WBC 8.5 05/21/2022   HGB 11.7 (L) 05/21/2022   HCT 34.7 (L) 05/21/2022   MCV 94.3 05/21/2022   PLT 205 05/21/2022   No results for input(s): NA, K, CL, CO2, BUN, CREATININE, CALCIUM, PROT, BILITOT, ALKPHOS, ALT, AST, GLUCOSE in the last 168 hours.  Invalid input(s): LABALBU Lab Results  Component Value Date   TROPONINI <0.03 07/27/2017    Lab Results  Component Value Date   CHOL 90 04/07/2022   CHOL 121 11/21/2021   CHOL 102 08/22/2021   Lab Results  Component Value Date   HDL 36 (L) 04/07/2022   HDL 39 (L) 11/21/2021   HDL 32 (L) 08/22/2021   Lab Results  Component Value Date   LDLCALC 30  04/07/2022   LDLCALC 55 11/21/2021   LDLCALC 47 08/22/2021   Lab Results  Component Value Date   TRIG 120 04/07/2022   TRIG 157 (H) 11/21/2021   TRIG 128 08/22/2021   Lab Results  Component Value Date   CHOLHDL 2.5 04/07/2022   CHOLHDL 3.2 08/22/2021   CHOLHDL 2.9 06/28/2021   No results found for: LDLDIRECT    Radiology: DG Chest Portable 1 View  Result Date: 05/21/2022 CLINICAL DATA:  Bradycardia.  Right arm pain. EXAM: PORTABLE CHEST 1 VIEW COMPARISON:  Chest radiograph dated June 30, 2021 FINDINGS: The heart is mildly enlarged with evidence of prior coronary artery bypass grafting. Chronic elevation of the left hemidiaphragm with left basilar atelectasis. Lungs otherwise clear. Thoracic spondylosis. IMPRESSION: No active disease. Electronically Signed   By: Keane Police D.O.   On: 05/21/2022 13:32    EKG: SR wenckebach rate 47   ASSESSMENT AND PLAN:   Bradycardia:  asymptomatic off AV nodal drugs. Above HiS appearing on ECG During my interview had NSR with HRls in high 50's to low 60's If K/Cr, TSH and labs ok can be d/c home Will arrange outpatient 30 day monitor and f/u with Dr [name] CABG:  June 2021 ECG with no acute ischemic changes no chest pain stable  DM: Discussed low carb diet.  Target hemoglobin A1c is 6.5 or less.  Continue current medications. HLD:  continue statin   Signed: Jenkins Rouge 05/21/2022, 1:45 PM

## 2022-05-22 ENCOUNTER — Telehealth: Payer: Self-pay | Admitting: Cardiology

## 2022-05-22 DIAGNOSIS — R001 Bradycardia, unspecified: Secondary | ICD-10-CM

## 2022-05-22 NOTE — Telephone Encounter (Signed)
STAT if HR is under 50 or over 120 (normal HR is 60-100 beats per minute)  What is your heart rate? 30's-40's   Do you have a log of your heart rate readings (document readings)? In the 30's yesterday at the kidney Dr. Was in 40's-50's at [facility]  Do you have any other symptoms? No    Requesting to be worked in for a sooner appt than July. Was advised she needs to see Dr. Diona Browner and discuss getting a heart monitor.

## 2022-05-22 NOTE — Telephone Encounter (Signed)
According to Dr.Nishan's consult note, pt needs 30 day monitor, then follow up.   Enrolled in [employer] today to be mailed to patients home. We will need to push back her follow up date past 7/7 as she will still be wearing the monitor.   I will place order to [employer]. She lives in [place] may need to go there for assistance in placing monitor on.Please reach out to sister, [name] , [phone]

## 2022-05-23 ENCOUNTER — Telehealth: Payer: Self-pay

## 2022-05-23 NOTE — Telephone Encounter (Signed)
Called pt no answer no vm medication was sent 6/6, they are probably not filling it due to it being otc

## 2022-05-23 NOTE — Telephone Encounter (Signed)
Patient called need med refill, patient called the pharmacy pharmacy has not received this  cetirizine (ZYRTEC) 10 MG tablet  Pharmacy: [facility] 10 mg

## 2022-05-27 NOTE — Telephone Encounter (Signed)
Called pt no answer no vm 

## 2022-06-02 NOTE — Telephone Encounter (Signed)
Called pt no answer no vm 

## 2022-06-12 ENCOUNTER — Ambulatory Visit: Payer: Medicare Other | Admitting: Nurse Practitioner

## 2022-06-20 ENCOUNTER — Ambulatory Visit: Payer: Medicare Other | Admitting: Physician Assistant

## 2022-06-30 ENCOUNTER — Other Ambulatory Visit: Payer: Self-pay | Admitting: Nurse Practitioner

## 2022-06-30 DIAGNOSIS — K2100 Gastro-esophageal reflux disease with esophagitis, without bleeding: Secondary | ICD-10-CM

## 2022-07-02 NOTE — Progress Notes (Signed)
Cardiology Office Note  Date: 07/03/2022   ID: [name] [name], DOB 1941-08-22, MRN [id number]  PCP:  [name], FNP  Cardiologist:  Rozann Lesches, MD Electrophysiologist:  None   Chief Complaint  Patient presents with   Cardiac follow-up    History of Present Illness: [name] [name] is an 81 y.o. female last seen in January.  She is here today with her daughter. I reviewed the chart, she was seen recently in June by Dr. Johnsie Cancel for further evaluation of bradycardia.  She was noted to have episodic second-degree heart block, predominantly type I with some periods of 2:1 block.  No AV nodal blockers at baseline.  Outpatient cardiac monitor was recommended.  She presents today reporting no dizziness or syncope, no angina.  She actually brought in the 30-day [employer] monitor in the package in which she received in the mail.  She has been very anxious and worried about putting the monitor on herself, thought that she might break it in someway.  We discussed using a Zio patch instead which nursing can put on her today.  I reviewed her medications which are unchanged and outlined below.  Past Medical History:  Diagnosis Date   Anxiety    CAD (coronary artery disease)    Multivessel status post CABG June 2021 - LIMA to LAD, SVG to distal RCA, SVG to OM1   Chronic obstructive pulmonary disease, unspecified ([place])    Depression    Essential hypertension    GERD    History of pneumonia    Incarcerated ventral hernia    Osteoarthritis    Type 2 diabetes mellitus ([place])     Past Surgical History:  Procedure Laterality Date   CATARACT EXTRACTION Bilateral 2023   CORONARY ARTERY BYPASS GRAFT N/A 05/22/2020   Procedure: CORONARY ARTERY BYPASS GRAFTING (CABG) x3 LIMA TO LAD, SVG TO OM1, SVG TO DISTAL RIGHT;  Surgeon: Lajuana Matte, MD;  Location: [facility];  Service: Open Heart Surgery;  Laterality: N/A;   LEFT HEART CATH AND CORONARY ANGIOGRAPHY N/A 05/21/2020    Procedure: LEFT HEART CATH AND CORONARY ANGIOGRAPHY;  Surgeon: Martinique, Peter M, MD;  Location: [facility];  Service: Cardiovascular;  Laterality: N/A;   NEPHRECTOMY     TEE WITHOUT CARDIOVERSION N/A 05/22/2020   Procedure: TRANSESOPHAGEAL ECHOCARDIOGRAM (TEE);  Surgeon: Lajuana Matte, MD;  Location: [facility];  Service: Open Heart Surgery;  Laterality: N/A;   VEIN HARVEST Right 05/22/2020   Procedure: Open Vein Harvest;  Surgeon: Lajuana Matte, MD;  Location: [facility];  Service: Open Heart Surgery;  Laterality: Right;   VENTRAL HERNIA REPAIR  09/01/2017   mistaken entry    Current Outpatient Medications  Medication Sig Dispense Refill   Accu-Chek Softclix Lancets lancets Use as instructed 100 each 12   aspirin EC 81 MG tablet Take 1 tablet (81 mg total) by mouth daily. Swallow whole. 90 tablet 3   atorvastatin (LIPITOR) 40 MG tablet TAKE 1 TABLET BY MOUTH AT BEDTIME. 90 tablet 3   Biotin w/ Vitamins C & E (HAIR SKIN & NAILS GUMMIES PO) Take 2 each by mouth every morning.     blood glucose meter kit and supplies Dispense based on patient and insurance preference. Use up to four times daily as directed. (FOR ICD-10 E10.9, E11.9). 1 each 0   cetirizine (ZYRTEC) 10 MG tablet Take 1 tablet (10 mg total) by mouth daily as needed (allergies). 30 tablet 3   famotidine (PEPCID)  20 MG tablet TAKE 1 TABLET BY MOUTH ONCE DAILY. 30 tablet 0   fish oil-omega-3 fatty acids 1000 MG capsule Take 1 g by mouth 2 (two) times daily.     glipiZIDE (GLUCOTROL XL) 5 MG 24 hr tablet Take 1 tablet (5 mg total) by mouth daily with breakfast. 90 tablet 3   glucose blood (ACCU-CHEK GUIDE) test strip Use as instructed to monitor glucose twice daily 100 each 12   glucose blood test strip Use as instructed to monitor glucose twice daily 100 each 1   linagliptin (TRADJENTA) 5 MG TABS tablet Take 1 tablet (5 mg total) by mouth daily. 90 tablet 3   metFORMIN (GLUCOPHAGE) 500 MG tablet Take 1 tablet (500 mg  total) by mouth daily with breakfast. 90 tablet 3   Multiple Vitamin (MULITIVITAMIN WITH MINERALS) TABS Take 1 tablet by mouth daily.     nitroGLYCERIN (NITROSTAT) 0.4 MG SL tablet Place 1 tablet (0.4 mg total) under the tongue every 5 (five) minutes x 3 doses as needed for chest pain (if no relief after 3rd dose, proceed to the ED or call 911). 25 tablet 3   ofloxacin (OCUFLOX) 0.3 % ophthalmic solution      polyethylene glycol (MIRALAX / GLYCOLAX) 17 g packet Take 17 g by mouth daily as needed for mild constipation or moderate constipation.     prednisoLONE acetate (PRED FORTE) 1 % ophthalmic suspension SMARTSIG:In Eye(s)     No current facility-administered medications for this visit.   Allergies:  Iodinated contrast media, Codeine, Morphine and related, Other, and Penicillins   ROS: No syncope.  Physical Exam: VS:  BP 118/62   Pulse 66   Ht _0  (1.499 m)   Wt 146 lb 9.6 oz (66.5 kg)   SpO2 96%   BMI 29.61 kg/m , BMI Body mass index is 29.61 kg/m.  Wt Readings from Last 3 Encounters:  07/03/22 146 lb 9.6 oz (66.5 kg)  05/21/22 153 lb (69.4 kg)  05/06/22 153 lb (69.4 kg)    General: Patient appears comfortable at rest. HEENT: Conjunctiva and lids normal, oropharynx clear. Neck: Supple, no elevated JVP or carotid bruits, no thyromegaly. Lungs: Clear to auscultation, nonlabored breathing at rest. Cardiac: Regular rate and rhythm, no S3 or significant systolic murmur, no pericardial rub. Extremities: No pitting edema.  ECG:  An ECG dated 05/21/2022 was personally reviewed today and demonstrated:  Sinus bradycardia with second-degree type I AV block, low voltage, nonspecific ST-T changes.  Recent Labwork: 05/21/2022: ALT 32; AST 25; BUN 22; Creatinine, Ser 1.16; Hemoglobin 11.7; Magnesium 1.9; Platelets 205; Potassium 4.5; Sodium 138; TSH 2.469     Component Value Date/Time   CHOL 90 04/07/2022 1226   CHOL 121 11/21/2021 1115   TRIG 120 04/07/2022 1226   HDL 36 (L) 04/07/2022  1226   HDL 39 (L) 11/21/2021 1115   CHOLHDL 2.5 04/07/2022 1226   VLDL 24 04/07/2022 1226   LDLCALC 30 04/07/2022 1226   LDLCALC 55 11/21/2021 1115   LDLCALC 109 (H) 03/07/2020 1137    Other Studies Reviewed Today:  Carotid Dopplers 07/01/2021: IMPRESSION: 1. Right carotid artery system: Less than 50% stenosis secondary to mild multifocal atherosclerotic plaque formation.   2. Left carotid artery system: Less than 50% stenosis secondary to moderate multifocal atherosclerotic plaque formation about the carotid bulb.   3.  Vertebral artery system: Patent with antegrade flow bilaterally.  Echocardiogram 07/02/2021:  1. Left ventricular ejection fraction, by estimation, is 55 to 60%. The  left ventricle has normal function. The left ventricle has no regional  wall motion abnormalities. There is mild left ventricular hypertrophy.  Left ventricular diastolic parameters  were normal.   2. Right ventricular systolic function is normal. The right ventricular  size is normal. There is normal pulmonary artery systolic pressure. The  estimated right ventricular systolic pressure is 39.6 mmHg.   3. The mitral valve is grossly normal. Trivial mitral valve  regurgitation.   4. The aortic valve is tricuspid. There is mild calcification of the  aortic valve. Aortic valve regurgitation is not visualized. Mild aortic  valve sclerosis is present, with no evidence of aortic valve stenosis.   5. The inferior vena cava is normal in size with greater than 50%  respiratory variability, suggesting right atrial pressure of 3 mmHg.   Assessment and Plan:  1.  Intermittent bradycardia with second-degree type I and some 2:1 block per recent ER evaluation in consultation with Dr. Johnsie Cancel.  She is not on any AV nodal blockers.  No syncope.  As discussed above she was not comfortable putting on Preventice monitor and brought back in today.  We will place a 7-day Zio patch per nursing to get further  information.  2.  Multivessel CAD status post CABG in June 2021.  She does not describe any active angina at this time.  Currently on aspirin, Lipitor, and has as needed nitroglycerin available.  3.  Mixed hyperlipidemia, tolerating Lipitor with last LDL 30.  4.  CKD stage IIIa-b, recent creatinine 1.16 with normal potassium.  Medication Adjustments/Labs and Tests Ordered: Current medicines are reviewed at length with the patient today.  Concerns regarding medicines are outlined above.   Tests Ordered: No orders of the defined types were placed in this encounter.   Medication Changes: No orders of the defined types were placed in this encounter.   Disposition:  Follow up  3 months.  Signed, Satira Sark, MD, [facility] 07/03/2022 11:41 AM    [facility], [place], [place] [address] Phone: [phone]; Fax: [phone]

## 2022-07-03 ENCOUNTER — Ambulatory Visit (INDEPENDENT_AMBULATORY_CARE_PROVIDER_SITE_OTHER): Payer: Medicare Other

## 2022-07-03 ENCOUNTER — Encounter: Payer: Self-pay | Admitting: Cardiology

## 2022-07-03 ENCOUNTER — Other Ambulatory Visit: Payer: Self-pay | Admitting: Cardiology

## 2022-07-03 ENCOUNTER — Ambulatory Visit (INDEPENDENT_AMBULATORY_CARE_PROVIDER_SITE_OTHER): Payer: Medicare Other | Admitting: Cardiology

## 2022-07-03 VITALS — BP 118/62 | HR 66 | Ht 59.0 in | Wt 146.6 lb

## 2022-07-03 DIAGNOSIS — R001 Bradycardia, unspecified: Secondary | ICD-10-CM

## 2022-07-03 DIAGNOSIS — I441 Atrioventricular block, second degree: Secondary | ICD-10-CM

## 2022-07-03 DIAGNOSIS — I25119 Atherosclerotic heart disease of native coronary artery with unspecified angina pectoris: Secondary | ICD-10-CM

## 2022-07-03 NOTE — Patient Instructions (Signed)
Medication Instructions:  Your physician recommends that you continue on your current medications as directed. Please refer to the Current Medication list given to you today.  Labwork: none  Testing/Procedures: Your physician has recommended that you wear a Zio monitor.   This monitor is a medical device that records the heart's electrical activity. Doctors most often use these monitors to diagnose arrhythmias. Arrhythmias are problems with the speed or rhythm of the heartbeat. The monitor is a small device applied to your chest. You can wear one while you do your normal daily activities. While wearing this monitor if you have any symptoms to push the button and record what you felt. Once you have worn this monitor for the period of time provider prescribed (for 7 days), you will return the monitor device in the postage paid box. Once it is returned they will download the data collected and provide us with a report which the provider will then review and we will call you with those results. Important tips:  Avoid showering during the first 24 hours of wearing the monitor. Avoid excessive sweating to help maximize wear time. Do not submerge the device, no hot tubs, and no swimming pools. Keep any lotions or oils away from the patch. After 24 hours you may shower with the patch on. Take brief showers with your back facing the shower head.  Do not remove patch once it has been placed because that will interrupt data and decrease adhesive wear time. Push the button when you have any symptoms and write down what you were feeling. Once you have completed wearing your monitor, remove and place into box which has postage paid and place in your outgoing mailbox.  If for some reason you have misplaced your box then call our office and we can provide another box and/or mail it off for you.  Follow-Up: Your physician recommends that you schedule a follow-up appointment in: 3 months  Any Other Special  Instructions Will Be Listed Below (If Applicable).  If you need a refill on your cardiac medications before your next appointment, please call your pharmacy. 

## 2022-07-04 ENCOUNTER — Telehealth: Payer: Self-pay | Admitting: Cardiology

## 2022-07-04 NOTE — Telephone Encounter (Signed)
Patient's daughter, [name], is following up to provide the best call back number for when her call is returned, [phone].

## 2022-07-04 NOTE — Telephone Encounter (Signed)
Pt c/o medication issue:  1. Name of Medication: metoprolol  2. How are you currently taking this medication (dosage and times per day)?   3. Are you having a reaction (difficulty breathing--STAT)?   4. What is your medication issue? Pt has questions in regards to this medication. She was told by pharmacy she was to not take this medication anymore, but she said it was called in for her again. She is requesting clarification.

## 2022-07-04 NOTE — Telephone Encounter (Signed)
Please clarify if patient is to be taking Lopressor 12.5mg  twice a day.  Informed patient this was not refilled from our office.  Was d/c'd 06/2021.  States she has been taking this medication all along.

## 2022-07-07 NOTE — Telephone Encounter (Signed)
lmtcb

## 2022-07-08 NOTE — Telephone Encounter (Signed)
Patient notified and verbalized understanding. 

## 2022-07-16 DIAGNOSIS — R001 Bradycardia, unspecified: Secondary | ICD-10-CM | POA: Diagnosis not present

## 2022-07-25 ENCOUNTER — Other Ambulatory Visit: Payer: Self-pay | Admitting: Cardiology

## 2022-07-25 ENCOUNTER — Other Ambulatory Visit: Payer: Self-pay | Admitting: Nurse Practitioner

## 2022-07-25 DIAGNOSIS — K2100 Gastro-esophageal reflux disease with esophagitis, without bleeding: Secondary | ICD-10-CM

## 2022-08-05 ENCOUNTER — Telehealth: Payer: Self-pay | Admitting: *Deleted

## 2022-08-05 NOTE — Patient Outreach (Signed)
  Care Coordination   08/05/2022 Name: [name] [name] MRN: [id number] DOB: Sep 19, 1941   Care Coordination Outreach Attempts:  An unsuccessful telephone outreach was attempted today to offer the patient information about available care coordination services as a benefit of their health plan.   Follow Up Plan:  Additional outreach attempts will be made to offer the patient care coordination information and services.   Encounter Outcome:  No Answer  Care Coordination Interventions Activated:  No   Care Coordination Interventions:  No, not indicated    Irving Shows [facility], BSN RN Case Manager [phone]

## 2022-08-06 ENCOUNTER — Encounter: Payer: Self-pay | Admitting: *Deleted

## 2022-08-08 ENCOUNTER — Telehealth: Payer: Self-pay | Admitting: *Deleted

## 2022-08-08 NOTE — Patient Outreach (Signed)
  Care Coordination   08/08/2022 Name: [name] [name] MRN: [id number] DOB: April 01, 1941   Care Coordination Outreach Attempts:  A second unsuccessful outreach was attempted today to offer the patient with information about available care coordination services as a benefit of their health plan.     Follow Up Plan:  Additional outreach attempts will be made to offer the patient care coordination information and services.   Encounter Outcome:  No Answer  Care Coordination Interventions Activated:  No   Care Coordination Interventions:  No, not indicated    Irving Shows [facility], BSN RN Care Coordinator [phone]

## 2022-08-12 ENCOUNTER — Ambulatory Visit: Payer: Medicare Other | Admitting: Nurse Practitioner

## 2022-08-13 ENCOUNTER — Telehealth: Payer: Self-pay | Admitting: *Deleted

## 2022-08-13 NOTE — Patient Outreach (Signed)
  Care Coordination   08/13/2022 Name: JAIDYNN BALSTER MRN: [id number] DOB: 14-Jul-1941   Care Coordination Outreach Attempts:  A third unsuccessful outreach was attempted today to offer the patient with information about available care coordination services as a benefit of their health plan.   Follow Up Plan:  No further outreach attempts will be made at this time. We have been unable to contact the patient to offer or enroll patient in care coordination services  Encounter Outcome:  No Answer  Care Coordination Interventions Activated:  No   Care Coordination Interventions:  No, not indicated    Irving Shows [facility], BSN RN Care Coordinator [phone]

## 2022-08-21 ENCOUNTER — Encounter: Payer: Self-pay | Admitting: Nurse Practitioner

## 2022-08-21 ENCOUNTER — Ambulatory Visit (INDEPENDENT_AMBULATORY_CARE_PROVIDER_SITE_OTHER): Payer: Medicare Other | Admitting: Nurse Practitioner

## 2022-08-21 VITALS — BP 122/62 | HR 78 | Ht 59.0 in | Wt 145.0 lb

## 2022-08-21 DIAGNOSIS — I739 Peripheral vascular disease, unspecified: Secondary | ICD-10-CM

## 2022-08-21 DIAGNOSIS — M25551 Pain in right hip: Secondary | ICD-10-CM | POA: Diagnosis not present

## 2022-08-21 DIAGNOSIS — I119 Hypertensive heart disease without heart failure: Secondary | ICD-10-CM | POA: Diagnosis not present

## 2022-08-21 DIAGNOSIS — E1121 Type 2 diabetes mellitus with diabetic nephropathy: Secondary | ICD-10-CM | POA: Diagnosis not present

## 2022-08-21 DIAGNOSIS — N1832 Chronic kidney disease, stage 3b: Secondary | ICD-10-CM | POA: Diagnosis not present

## 2022-08-21 DIAGNOSIS — E782 Mixed hyperlipidemia: Secondary | ICD-10-CM | POA: Diagnosis not present

## 2022-08-21 DIAGNOSIS — E663 Overweight: Secondary | ICD-10-CM | POA: Diagnosis not present

## 2022-08-21 NOTE — Assessment & Plan Note (Signed)
Lab Results  Component Value Date   HGBA1C 6.6 (H) 04/07/2022  Chronic condition well-controlled on Tradjenta 5 mg daily, metformin 500 mg daily, glipizide 5 mg daily Not on ACE or ARB Blood pressure well controlled Patient encouraged to maintain close follow-up with endocrinology she verbalized understanding Avoid sugar sweets

## 2022-08-21 NOTE — Assessment & Plan Note (Addendum)
Wt Readings from Last 3 Encounters:  08/21/22 145 lb (65.8 kg)  07/03/22 146 lb 9.6 oz (66.5 kg)  05/21/22 153 lb (69.4 kg)  Patient counseled on low-carb diet Encouraged to engage in regular walking exercises at least for 150 minutes weekly as tolerated

## 2022-08-21 NOTE — Assessment & Plan Note (Signed)
Lab Results  Component Value Date   CHOL 90 04/07/2022   HDL 36 (L) 04/07/2022   LDLCALC 30 04/07/2022   TRIG 120 04/07/2022   CHOLHDL 2.5 04/07/2022  LDL well controlled Continue atorvastatin 40 mg daily

## 2022-08-21 NOTE — Patient Instructions (Addendum)
Please keep your legs elevated when sitting to assist with leg swelling , your feet and legs are not swollen today.   Please TDAP and shingles vaccine at the pharmacy    It is important that you exercise regularly at least 30 minutes 5 times a weekly as tolerated  Think about what you will eat, plan ahead. Choose " clean, green, fresh or frozen" over canned, processed or packaged foods which are more sugary, salty and fatty. 70 to 75% of food eaten should be vegetables and fruit. Three meals at set times with snacks allowed between meals, but they must be fruit or vegetables. Aim to eat over a 12 hour period , example 7 am to 7 pm, and STOP after  your last meal of the day. Drink water,generally about 64 ounces per day, no other drink is as healthy. Fruit juice is best enjoyed in a healthy way, by EATING the fruit.  Thanks for choosing [facility], we consider it a privelige to serve you.

## 2022-08-21 NOTE — Assessment & Plan Note (Addendum)
Lab Results  Component Value Date   CHOL 90 04/07/2022   HDL 36 (L) 04/07/2022   LDLCALC 30 04/07/2022   TRIG 120 04/07/2022   CHOLHDL 2.5 04/07/2022  LDL well controlled on atorvastatin 40 mg daily Continue current medication Avoid fatty fried foods Has right arm pain when she reaches upt to pick something, this could be related to her.

## 2022-08-21 NOTE — Progress Notes (Signed)
[name] [name]     MRN: [id number]      DOB: 08/26/1941   HPI [name] [name] with past medical history of CAD, TIA, type 2 diabetes with diabetic nephropathy, CKD stage III 80 ventral hernia without obstruction is here for follow up and re-evaluation of chronic medical conditions, medication management .   Patient complains of right hip pain after walking a long distances. Sometimes has  right arm  pain when reaching up for something. Patient denies numbness, tingling, swelling, trauma . She sometimes has leg swelling but not currently.   The patient denies any adverse reactions to current medication  Due for Tdap vaccine, shingles vaccine need for both vaccines discussed with patient patient encouraged to get the vaccines at her pharmacy.   ROS Denies recent fever or chills. Denies sinus pressure, nasal congestion, ear pain or sore throat. Denies chest congestion, productive cough or wheezing. Denies chest pains, palpitations and leg swelling   Denies dysuria, frequency, hesitancy or incontinence. Denies headaches, seizures, numbness, or tingling. Denies depression, anxiety or insomnia. Denies skin break down or rash.   PE  BP 122/62 (BP Location: Right Arm, Cuff Size: Normal)   Pulse 78   Ht _0  (1.499 m)   Wt 145 lb (65.8 kg)   SpO2 93%   BMI 29.29 kg/m   Patient alert and oriented and in no cardiopulmonary distress.   Chest: Clear to auscultation bilaterally.  CVS: S1, S2 no murmurs, no S3.Regular rate.  ABD: Soft non tender, hernia  Ext: No edema  MS: Adequate ROM spine, shoulders, hips and knees.  Skin: Intact, no ulcerations or rash noted.  Psych: Good eye contact, normal affect. Memory intact not anxious or depressed appearing.  CNS: CN 2-12 intact, power,  normal throughout.no focal deficits noted.   Assessment & Plan  Hypertensive heart disease without heart failure BP Readings from Last 3 Encounters:  08/21/22 122/62  07/03/22 118/62  05/21/22  (!) 145/52  Blood pressure well controlled She was taken off metoprolol due to second-degree AV block Patient denies chest pain, dizziness DASH diet advised patient encouraged to engage in regular daily walking exercises as tolerated Appreciate collaboration cardiology  PAD (peripheral artery disease) ([place]) Lab Results  Component Value Date   CHOL 90 04/07/2022   HDL 36 (L) 04/07/2022   LDLCALC 30 04/07/2022   TRIG 120 04/07/2022   CHOLHDL 2.5 04/07/2022  LDL well controlled on atorvastatin 40 mg daily Continue current medication Avoid fatty fried foods Has right arm pain when she reaches upt to pick something, this could be related to her.  Type 2 diabetes mellitus with diabetic nephropathy ([facility]) Lab Results  Component Value Date   HGBA1C 6.6 (H) 04/07/2022  Chronic condition well-controlled on Tradjenta 5 mg daily, metformin 500 mg daily, glipizide 5 mg daily Not on ACE or ARB Blood pressure well controlled Patient encouraged to maintain close follow-up with endocrinology she verbalized understanding Avoid sugar sweets  Chronic kidney disease, stage 3b ([place]) Lab Results  Component Value Date   NA 138 05/21/2022   K 4.5 05/21/2022   CO2 22 05/21/2022   GLUCOSE 124 (H) 05/21/2022   BUN 22 05/21/2022   CREATININE 1.16 (H) 05/21/2022   CALCIUM 9.1 05/21/2022   EGFR 39 (L) 11/21/2021   GFRNONAA 48 (L) 05/21/2022  Stable chronic disease not on ACE or ARB Avoid NSAIDs and other nephrotoxic agents Patient encouraged to maintain close.  Nephrologist she verbalized understanding  Mixed hyperlipidemia Lab Results  Component  Value Date   CHOL 90 04/07/2022   HDL 36 (L) 04/07/2022   LDLCALC 30 04/07/2022   TRIG 120 04/07/2022   CHOLHDL 2.5 04/07/2022  LDL well controlled Continue atorvastatin 40 mg daily  Overweight (BMI 25.0-29.9) Wt Readings from Last 3 Encounters:  08/21/22 145 lb (65.8 kg)  07/03/22 146 lb 9.6 oz (66.5 kg)  05/21/22 153 lb (69.4 kg)  Patient  counseled on low-carb diet Encouraged to engage in regular walking exercises at least for 150 minutes weekly as tolerated  Right hip pain Has right hip when she walks long distances Take OTC Tylenol as needed might benefit from otho referal if her pain becomes worse

## 2022-08-21 NOTE — Assessment & Plan Note (Signed)
BP Readings from Last 3 Encounters:  08/21/22 122/62  07/03/22 118/62  05/21/22 (!) 145/52  Blood pressure well controlled She was taken off metoprolol due to second-degree AV block Patient denies chest pain, dizziness DASH diet advised patient encouraged to engage in regular daily walking exercises as tolerated Appreciate collaboration cardiology

## 2022-08-21 NOTE — Assessment & Plan Note (Signed)
Lab Results  Component Value Date   NA 138 05/21/2022   K 4.5 05/21/2022   CO2 22 05/21/2022   GLUCOSE 124 (H) 05/21/2022   BUN 22 05/21/2022   CREATININE 1.16 (H) 05/21/2022   CALCIUM 9.1 05/21/2022   EGFR 39 (L) 11/21/2021   GFRNONAA 48 (L) 05/21/2022  Stable chronic disease not on ACE or ARB Avoid NSAIDs and other nephrotoxic agents Patient encouraged to maintain close.  Nephrologist she verbalized understanding

## 2022-08-21 NOTE — Assessment & Plan Note (Signed)
Has right hip when she walks long distances Take OTC Tylenol as needed might benefit from otho referal if her pain becomes worse

## 2022-08-26 DIAGNOSIS — E1151 Type 2 diabetes mellitus with diabetic peripheral angiopathy without gangrene: Secondary | ICD-10-CM | POA: Diagnosis not present

## 2022-08-26 DIAGNOSIS — E1140 Type 2 diabetes mellitus with diabetic neuropathy, unspecified: Secondary | ICD-10-CM | POA: Diagnosis not present

## 2022-09-09 ENCOUNTER — Ambulatory Visit (INDEPENDENT_AMBULATORY_CARE_PROVIDER_SITE_OTHER): Payer: Medicare Other | Admitting: Nurse Practitioner

## 2022-09-09 ENCOUNTER — Telehealth: Payer: Self-pay

## 2022-09-09 ENCOUNTER — Encounter: Payer: Self-pay | Admitting: Nurse Practitioner

## 2022-09-09 VITALS — BP 130/80 | HR 74 | Ht 59.0 in | Wt 188.0 lb

## 2022-09-09 DIAGNOSIS — Z905 Acquired absence of kidney: Secondary | ICD-10-CM | POA: Diagnosis not present

## 2022-09-09 DIAGNOSIS — E559 Vitamin D deficiency, unspecified: Secondary | ICD-10-CM | POA: Diagnosis not present

## 2022-09-09 DIAGNOSIS — E1121 Type 2 diabetes mellitus with diabetic nephropathy: Secondary | ICD-10-CM | POA: Diagnosis not present

## 2022-09-09 DIAGNOSIS — E782 Mixed hyperlipidemia: Secondary | ICD-10-CM

## 2022-09-09 DIAGNOSIS — I10 Essential (primary) hypertension: Secondary | ICD-10-CM | POA: Diagnosis not present

## 2022-09-09 LAB — POCT GLYCOSYLATED HEMOGLOBIN (HGB A1C): Hemoglobin A1C: 5.6 % (ref 4.0–5.6)

## 2022-09-09 NOTE — Telephone Encounter (Signed)
Pt is calling to get clarification on her medication

## 2022-09-09 NOTE — Progress Notes (Signed)
Endocrinology Follow Up Note       09/09/2022, 11:10 AM   Subjective:    Patient ID: [name] [name], female    DOB: 08/29/1941.  [name] [name] is being seen in follow up after being seen in consultation for management of currently uncontrolled symptomatic diabetes requested by  [name] Rival, FNP.   Past Medical History:  Diagnosis Date   Anxiety    CAD (coronary artery disease)    Multivessel status post CABG June 2021 - LIMA to LAD, SVG to distal RCA, SVG to OM1   Chronic obstructive pulmonary disease, unspecified ([place])    Depression    Essential hypertension    GERD    History of pneumonia    Incarcerated ventral hernia    Osteoarthritis    Type 2 diabetes mellitus ([place])     Past Surgical History:  Procedure Laterality Date   CATARACT EXTRACTION Bilateral 2023   CORONARY ARTERY BYPASS GRAFT N/A 05/22/2020   Procedure: CORONARY ARTERY BYPASS GRAFTING (CABG) x3 LIMA TO LAD, SVG TO OM1, SVG TO DISTAL RIGHT;  Surgeon: Lajuana Matte, MD;  Location: [facility];  Service: Open Heart Surgery;  Laterality: N/A;   LEFT HEART CATH AND CORONARY ANGIOGRAPHY N/A 05/21/2020   Procedure: LEFT HEART CATH AND CORONARY ANGIOGRAPHY;  Surgeon: Martinique, Peter M, MD;  Location: [facility];  Service: Cardiovascular;  Laterality: N/A;   NEPHRECTOMY     TEE WITHOUT CARDIOVERSION N/A 05/22/2020   Procedure: TRANSESOPHAGEAL ECHOCARDIOGRAM (TEE);  Surgeon: Lajuana Matte, MD;  Location: [facility];  Service: Open Heart Surgery;  Laterality: N/A;   VEIN HARVEST Right 05/22/2020   Procedure: Open Vein Harvest;  Surgeon: Lajuana Matte, MD;  Location: [facility];  Service: Open Heart Surgery;  Laterality: Right;   VENTRAL HERNIA REPAIR  09/01/2017   mistaken entry    Social History   Socioeconomic History   Marital status: Widowed    Spouse name: Not on file   Number of children: 1   Years of education: 68    Highest education level: 12th grade  Occupational History   Occupation: Retired  Tobacco Use   Smoking status: Never   Smokeless tobacco: Never  Vaping Use   Vaping Use: Never used  Substance and Sexual Activity   Alcohol use: No   Drug use: No   Sexual activity: Not Currently  Other Topics Concern   Not on file  Social History Narrative   Lives alone.  Has 1 daughter      Enjoys reading and cooking      Eats all food groups   Caffeine, some coffee   Water reports drinking pretty well throughout the day      Social Determinants of Health   Financial Resource Strain: Low Risk  (01/03/2022)   Overall Financial Resource Strain (CARDIA)    Difficulty of Paying Living Expenses: Not very hard  Food Insecurity: No Food Insecurity (01/03/2022)   Hunger Vital Sign    Worried About Running Out of Food in the Last Year: Never true    Ran Out of Food in the Last Year: Never true  Transportation Needs: No Transportation Needs (01/03/2022)  PRAPARE - Hydrologist (Medical): No    Lack of Transportation (Non-Medical): No  Physical Activity: Insufficiently Active (01/03/2022)   Exercise Vital Sign    Days of Exercise per Week: 3 days    Minutes of Exercise per Session: 20 min  Stress: No Stress Concern Present (01/03/2022)   [place]    Feeling of Stress : Only a little  Social Connections: Moderately Integrated (01/03/2022)   Social Connection and Isolation Panel [NHANES]    Frequency of Communication with Friends and Family: More than three times a week    Frequency of Social Gatherings with Friends and Family: More than three times a week    Attends Religious Services: More than 4 times per year    Active Member of [employer] or Organizations: Yes    Attends Archivist Meetings: Never    Marital Status: Widowed    Family History  Problem Relation Age of Onset   CAD Father     Heart failure Sister    CAD Daughter    Heart disease Daughter     Outpatient Encounter Medications as of 09/09/2022  Medication Sig   Accu-Chek Softclix Lancets lancets Use as instructed   aspirin EC 81 MG tablet Take 1 tablet (81 mg total) by mouth daily. Swallow whole.   atorvastatin (LIPITOR) 40 MG tablet TAKE 1 TABLET BY MOUTH AT BEDTIME.   Biotin w/ Vitamins C & E (HAIR SKIN & NAILS GUMMIES PO) Take 2 each by mouth every morning.   blood glucose meter kit and supplies Dispense based on patient and insurance preference. Use up to four times daily as directed. (FOR ICD-10 E10.9, E11.9).   cetirizine (ZYRTEC) 10 MG tablet Take 1 tablet (10 mg total) by mouth daily as needed (allergies).   famotidine (PEPCID) 20 MG tablet TAKE 1 TABLET BY MOUTH ONCE DAILY.   fish oil-omega-3 fatty acids 1000 MG capsule Take 1 g by mouth 2 (two) times daily.   glucose blood (ACCU-CHEK GUIDE) test strip Use as instructed to monitor glucose twice daily   glucose blood test strip Use as instructed to monitor glucose twice daily   linagliptin (TRADJENTA) 5 MG TABS tablet Take 1 tablet (5 mg total) by mouth daily.   metFORMIN (GLUCOPHAGE) 500 MG tablet Take 1 tablet (500 mg total) by mouth daily with breakfast.   Multiple Vitamin (MULITIVITAMIN WITH MINERALS) TABS Take 1 tablet by mouth daily.   nitroGLYCERIN (NITROSTAT) 0.4 MG SL tablet Place 1 tablet (0.4 mg total) under the tongue every 5 (five) minutes x 3 doses as needed for chest pain (if no relief after 3rd dose, proceed to the ED or call 911).   ofloxacin (OCUFLOX) 0.3 % ophthalmic solution    polyethylene glycol (MIRALAX / GLYCOLAX) 17 g packet Take 17 g by mouth daily as needed for mild constipation or moderate constipation.   prednisoLONE acetate (PRED FORTE) 1 % ophthalmic suspension    [DISCONTINUED] glipiZIDE (GLUCOTROL XL) 5 MG 24 hr tablet Take 1 tablet (5 mg total) by mouth daily with breakfast.   [DISCONTINUED] metoprolol tartrate (LOPRESSOR) 25  MG tablet Take 0.5 tablets (12.5 mg total) by mouth 2 (two) times daily.   No facility-administered encounter medications on file as of 09/09/2022.    ALLERGIES: Allergies  Allergen Reactions   Iodinated Contrast Media Other (See Comments)    Unknown reaction. Due to kidney issues. Do not use Unknown reaction. Due to  kidney issues. Do not use   Codeine Rash   Morphine And Related Other (See Comments) and Rash    Unknown Reaction Unknown Reaction   Other     Pt has multiple allergies per pt but unsure of all.    Penicillins Other (See Comments)    Unknown reaction Unknown reaction    VACCINATION STATUS: Immunization History  Administered Date(s) Administered   Influenza, High Dose Seasonal PF 11/12/2017, 08/13/2018   Influenza-Unspecified 11/12/2011, 11/03/2012, 11/02/2013, 09/20/2014, 09/19/2015, 09/23/2016, 08/18/2019   Pneumococcal Conjugate-13 09/20/2014   Pneumococcal Polysaccharide-23 03/18/2016    Diabetes She presents for her follow-up diabetic visit. She has type 2 diabetes mellitus. Onset time: Was diagnosed at approx age of 65. Her disease course has been improving. There are no hypoglycemic associated symptoms. Associated symptoms include blurred vision and foot paresthesias. Pertinent negatives for diabetes include no fatigue, no polydipsia and no polyuria. There are no hypoglycemic complications. Symptoms are stable. Diabetic complications include heart disease, nephropathy and peripheral neuropathy. Risk factors for coronary artery disease include diabetes mellitus, dyslipidemia, hypertension, stress, tobacco exposure, sedentary lifestyle and post-menopausal. Current diabetic treatment includes oral agent (triple therapy). She is compliant with treatment most of the time. Her weight is fluctuating minimally. She is following a generally healthy diet. When asked about meal planning, she reported none. She has not had a previous visit with a dietitian. She rarely  participates in exercise. Her home blood glucose trend is fluctuating minimally. Her breakfast blood glucose range is generally 110-130 mg/dl. Her bedtime blood glucose range is generally 110-130 mg/dl. Her overall blood glucose range is 110-130 mg/dl. (She presents today with her meter and logs showing stable, at goal glycemic profile overall.  Her POCT A1c today is 5.6%, improving from last visit of 6.6%.  She denies any hypoglycemia.) An ACE inhibitor/angiotensin II receptor blocker is not being taken. She does not see a podiatrist.Eye exam is current.  Hyperlipidemia This is a chronic problem. The current episode started more than 1 year ago. The problem is uncontrolled. Recent lipid tests were reviewed and are variable. Exacerbating diseases include chronic renal disease and diabetes. Factors aggravating her hyperlipidemia include fatty foods and beta blockers. Current antihyperlipidemic treatment includes statins. The current treatment provides mild improvement of lipids. Compliance problems include adherence to diet and adherence to exercise.  Risk factors for coronary artery disease include diabetes mellitus, dyslipidemia, hypertension, post-menopausal, a sedentary lifestyle and stress.  Hypertension This is a chronic problem. The current episode started more than 1 year ago. The problem has been waxing and waning since onset. The problem is controlled. Associated symptoms include blurred vision. There are no associated agents to hypertension. Risk factors for coronary artery disease include diabetes mellitus, dyslipidemia, sedentary lifestyle, smoking/tobacco exposure, stress and post-menopausal state. Past treatments include beta blockers. The current treatment provides mild improvement. There are no compliance problems.  Hypertensive end-organ damage includes kidney disease and CAD/MI. Identifiable causes of hypertension include chronic renal disease.    Review of systems  Constitutional: +  increasing body weight,  current Body mass index is 37.97 kg/m. , no fatigue, no subjective hyperthermia, no subjective hypothermia Eyes: no blurry vision, no xerophthalmia ENT: no sore throat, no nodules palpated in throat, no dysphagia/odynophagia, no hoarseness Cardiovascular: no chest pain, no shortness of breath, no palpitations, no leg swelling Respiratory: no cough, no shortness of breath Gastrointestinal: no nausea/vomiting/diarrhea Musculoskeletal: no muscle/joint aches Skin: no rashes, no hyperemia Neurological: no tremors, no numbness, no tingling, no dizziness Psychiatric: no  depression, no anxiety  Objective:     BP 130/80 (BP Location: Left Arm, Patient Position: Sitting, Cuff Size: Large)   Pulse 74   Ht 4' 11"  (1.499 m)   Wt 188 lb (85.3 kg)   BMI 37.97 kg/m   Wt Readings from Last 3 Encounters:  09/09/22 188 lb (85.3 kg)  08/21/22 145 lb (65.8 kg)  07/03/22 146 lb 9.6 oz (66.5 kg)     BP Readings from Last 3 Encounters:  09/09/22 130/80  08/21/22 122/62  07/03/22 118/62      Physical Exam- Limited  Constitutional:  Body mass index is 37.97 kg/m. , not in acute distress, anxious state of mind Eyes:  EOMI, no exophthalmos Neck: Supple Cardiovascular: RRR, no murmurs, rubs, or gallops, no edema Respiratory: Adequate breathing efforts, no crackles, rales, rhonchi, or wheezing Musculoskeletal: she does have abnormal bulge in LLQ of abdomen (unknown etiology), strength intact in all four extremities, no gross restriction of joint movements Skin:  no rashes, no hyperemia Neurological: no tremor with outstretched hands   CMP ( most recent) CMP     Component Value Date/Time   NA 138 05/21/2022 1258   NA 141 11/21/2021 1115   K 4.5 05/21/2022 1258   CL 110 05/21/2022 1258   CO2 22 05/21/2022 1258   GLUCOSE 124 (H) 05/21/2022 1258   BUN 22 05/21/2022 1258   BUN 27 11/21/2021 1115   CREATININE 1.16 (H) 05/21/2022 1258   CREATININE 1.28 (H) 03/07/2020  1137   CALCIUM 9.1 05/21/2022 1258   PROT 7.3 05/21/2022 1258   PROT 6.8 11/21/2021 1115   ALBUMIN 3.9 05/21/2022 1258   ALBUMIN 4.5 11/21/2021 1115   AST 25 05/21/2022 1258   ALT 32 05/21/2022 1258   ALKPHOS 67 05/21/2022 1258   BILITOT 0.5 05/21/2022 1258   BILITOT 0.3 11/21/2021 1115   GFRNONAA 48 (L) 05/21/2022 1258   GFRNONAA 40 (L) 03/07/2020 1137   GFRAA 48 (L) 12/28/2020 0955   GFRAA 46 (L) 03/07/2020 1137     Diabetic Labs (most recent): Lab Results  Component Value Date   HGBA1C 5.6 09/09/2022   HGBA1C 6.6 (H) 04/07/2022   HGBA1C 5.8 01/06/2022   MICROALBUR 30 02/27/2021   MICROALBUR 2.4 03/07/2020   MICROALBUR 0.9 08/13/2018     Lipid Panel ( most recent) Lipid Panel     Component Value Date/Time   CHOL 90 04/07/2022 1226   CHOL 121 11/21/2021 1115   TRIG 120 04/07/2022 1226   HDL 36 (L) 04/07/2022 1226   HDL 39 (L) 11/21/2021 1115   CHOLHDL 2.5 04/07/2022 1226   VLDL 24 04/07/2022 1226   LDLCALC 30 04/07/2022 1226   LDLCALC 55 11/21/2021 1115   LDLCALC 109 (H) 03/07/2020 1137   LABVLDL 27 11/21/2021 1115      Lab Results  Component Value Date   TSH 2.469 05/21/2022   TSH 3.040 08/22/2021   TSH 2.02 11/17/2019   FREET4 1.25 08/22/2021           Assessment & Plan:   1) Controlled Type 2 Diabetes with CKD stage 3  - [name] [name] has currently uncontrolled symptomatic type 2 DM since 81 years of age.  She presents today with her meter and logs showing stable, at goal glycemic profile overall.  Her POCT A1c today is 5.6%, improving from last visit of 6.6%.  She denies any hypoglycemia.  -Recent labs reviewed.  - I had a long discussion with her about the progressive nature  of diabetes and the pathology behind its complications. -her diabetes is complicated by CAD and she remains at a high risk for more acute and chronic complications which include CAD, CVA, CKD, retinopathy, and neuropathy. These are all discussed in detail with her.  -  Nutritional counseling repeated at each appointment due to patients tendency to fall back in to old habits.  - The patient admits there is a room for improvement in their diet and drink choices. -  Suggestion is made for the patient to avoid simple carbohydrates from their diet including Cakes, Sweet Desserts / Pastries, Ice Cream, Soda (diet and regular), Sweet Tea, Candies, Chips, Cookies, Sweet Pastries, Store Bought Juices, Alcohol in Excess of 1-2 drinks a day, Artificial Sweeteners, Coffee Creamer, and "Sugar-free" Products. This will help patient to have stable blood glucose profile and potentially avoid unintended weight gain.   - I encouraged the patient to switch to unprocessed or minimally processed complex starch and increased protein intake (animal or plant source), fruits, and vegetables.   - Patient is advised to stick to a routine mealtimes to eat 3 meals a day and avoid unnecessary snacks (to snack only to correct hypoglycemia).  - she will be scheduled with [name] [name], RDN, CDE for diabetes education.  - I have approached her with the following individualized plan to manage  her diabetes and patient agrees:   Given her stable, at goal glycemic profile, she can continue her current medication regimen of Metformin 500 mg po once daily with breakfast, Tradjenta 5 mg po daily with breakfast.  Will stop her Glipizide on a trial basis to avoid hypoglycemia.  -she is encouraged to continue monitoring blood glucose at least once daily, before breakfast, and to call the clinic if she has readings less than 70 or above 200 for 3 tests in a row.  - Specific targets for  A1c;  LDL, HDL,  and Triglycerides were discussed with the patient.  2) Blood Pressure /Hypertension:  her blood pressure is controlled to target for her age.   she is not currently on any antihypertensive medications at this time.  3) Lipids/Hyperlipidemia:    Review of her recent lipid panel from 04/07/22 showed  controlled  LDL at 30.  she  is advised to continue Lipitor 40 mg daily at bedtime and Fish Oil supplement.  Side effects and precautions discussed with her.    4)  Weight/Diet:  her Body mass index is 37.97 kg/m.  -  clearly complicating her diabetes care.   she is a candidate for weight loss. I discussed with her the fact that loss of 5 - 10% of her  current body weight will have the most impact on her diabetes management.  Exercise, and detailed carbohydrates information provided  -  detailed on discharge instructions.  5) Hypervitaminosis of vitamin D Her most recent vitamin D level on 08/22/21 was high at 113.  She was taking several supplements that contained vitamin D in it.  The nephrologist asked her to stop all vitamin D supplements at this time.  6) Chronic Care/Health Maintenance: -she is on Statin medications and is encouraged to initiate and continue to follow up with Ophthalmology, Dentist, Podiatrist at least yearly or according to recommendations, and advised to stay away from smoking. I have recommended yearly flu vaccine and pneumonia vaccine at least every 5 years; moderate intensity exercise for up to 150 minutes weekly; and sleep for at least 7 hours a day.  - she is advised  to maintain close follow up with [name] Rival, FNP for primary care needs, as well as her other providers for optimal and coordinated care.     I spent 41 minutes in the care of the patient today including review of labs from [facility], Lipids, Thyroid Function, Hematology (current and previous including abstractions from other facilities); face-to-face time discussing  her blood glucose readings/logs, discussing hypoglycemia and hyperglycemia episodes and symptoms, medications doses, her options of short and long term treatment based on the latest standards of care / guidelines;  discussion about incorporating lifestyle medicine;  and documenting the encounter. Risk reduction counseling performed per USPSTF  guidelines to reduce obesity and cardiovascular risk factors.     Please refer to Patient Instructions for Blood Glucose Monitoring and Insulin/Medications Dosing Guide"  in media tab for additional information. Please  also refer to " Patient Self Inventory" in the Media  tab for reviewed elements of pertinent patient history.  [name] [name] participated in the discussions, expressed understanding, and voiced agreement with the above plans.  All questions were answered to her satisfaction. she is encouraged to contact clinic should she have any questions or concerns prior to her return visit.   Follow up plan: - Return in about 4 months (around 01/09/2023) for Diabetes F/U with A1c in office, No previsit labs, Bring meter and logs.  [name] [name], [facility] [address] [place], [place] [address] Phone: [phone] Fax: [phone]  09/09/2022, 11:10 AM

## 2022-09-10 NOTE — Telephone Encounter (Signed)
Patient was called and the phone rang numerous times , no answer no voicemail. Will try and call the patient back.

## 2022-09-17 NOTE — Telephone Encounter (Signed)
I have tried several times to reach [name] [name], the number that was provided, when called it rings numerous times. Unable to leave a voicemail.

## 2022-09-25 ENCOUNTER — Telehealth: Payer: Self-pay | Admitting: Cardiology

## 2022-09-25 ENCOUNTER — Encounter (HOSPITAL_COMMUNITY): Payer: Self-pay

## 2022-09-25 ENCOUNTER — Emergency Department (HOSPITAL_COMMUNITY): Payer: Medicare Other

## 2022-09-25 ENCOUNTER — Emergency Department (HOSPITAL_COMMUNITY)
Admission: EM | Admit: 2022-09-25 | Discharge: 2022-09-26 | Disposition: A | Discharge status: Other Acute Inpatient Hospital | Payer: Medicare Other | Attending: Emergency Medicine | Admitting: Emergency Medicine

## 2022-09-25 ENCOUNTER — Other Ambulatory Visit: Payer: Self-pay

## 2022-09-25 DIAGNOSIS — J449 Chronic obstructive pulmonary disease, unspecified: Secondary | ICD-10-CM | POA: Insufficient documentation

## 2022-09-25 DIAGNOSIS — K436 Other and unspecified ventral hernia with obstruction, without gangrene: Secondary | ICD-10-CM

## 2022-09-25 DIAGNOSIS — E119 Type 2 diabetes mellitus without complications: Secondary | ICD-10-CM | POA: Insufficient documentation

## 2022-09-25 DIAGNOSIS — I10 Essential (primary) hypertension: Secondary | ICD-10-CM | POA: Insufficient documentation

## 2022-09-25 DIAGNOSIS — Z79899 Other long term (current) drug therapy: Secondary | ICD-10-CM | POA: Insufficient documentation

## 2022-09-25 DIAGNOSIS — R600 Localized edema: Secondary | ICD-10-CM | POA: Insufficient documentation

## 2022-09-25 DIAGNOSIS — R109 Unspecified abdominal pain: Secondary | ICD-10-CM | POA: Diagnosis present

## 2022-09-25 DIAGNOSIS — R0789 Other chest pain: Secondary | ICD-10-CM | POA: Diagnosis not present

## 2022-09-25 DIAGNOSIS — K439 Ventral hernia without obstruction or gangrene: Secondary | ICD-10-CM

## 2022-09-25 DIAGNOSIS — Z955 Presence of coronary angioplasty implant and graft: Secondary | ICD-10-CM | POA: Diagnosis not present

## 2022-09-25 DIAGNOSIS — I441 Atrioventricular block, second degree: Secondary | ICD-10-CM

## 2022-09-25 DIAGNOSIS — Z7982 Long term (current) use of aspirin: Secondary | ICD-10-CM | POA: Diagnosis not present

## 2022-09-25 DIAGNOSIS — Z7951 Long term (current) use of inhaled steroids: Secondary | ICD-10-CM | POA: Insufficient documentation

## 2022-09-25 DIAGNOSIS — Z7984 Long term (current) use of oral hypoglycemic drugs: Secondary | ICD-10-CM | POA: Diagnosis not present

## 2022-09-25 DIAGNOSIS — I2510 Atherosclerotic heart disease of native coronary artery without angina pectoris: Secondary | ICD-10-CM | POA: Diagnosis not present

## 2022-09-25 LAB — LIPASE, BLOOD: Lipase: 46 U/L (ref 11–51)

## 2022-09-25 LAB — COMPREHENSIVE METABOLIC PANEL
ALT: 27 U/L (ref 0–44)
AST: 25 U/L (ref 15–41)
Albumin: 4.1 g/dL (ref 3.5–5.0)
Alkaline Phosphatase: 61 U/L (ref 38–126)
Anion gap: 8 (ref 5–15)
BUN: 23 mg/dL (ref 8–23)
CO2: 27 mmol/L (ref 22–32)
Calcium: 9.3 mg/dL (ref 8.9–10.3)
Chloride: 102 mmol/L (ref 98–111)
Creatinine, Ser: 1.18 mg/dL — ABNORMAL HIGH (ref 0.44–1.00)
GFR, Estimated: 47 mL/min — ABNORMAL LOW (ref >60)
Glucose, Bld: 166 mg/dL — ABNORMAL HIGH (ref 70–99)
Potassium: 4.4 mmol/L (ref 3.5–5.1)
Sodium: 137 mmol/L (ref 135–145)
Total Bilirubin: 0.6 mg/dL (ref 0.3–1.2)
Total Protein: 7.8 g/dL (ref 6.5–8.1)

## 2022-09-25 LAB — CBC WITH DIFFERENTIAL/PLATELET
Abs Immature Granulocytes: 0.02 10*3/uL (ref 0.00–0.07)
Basophils Absolute: 0.1 10*3/uL (ref 0.0–0.1)
Basophils Relative: 1 %
Eosinophils Absolute: 0.1 10*3/uL (ref 0.0–0.5)
Eosinophils Relative: 2 %
HCT: 39.3 % (ref 36.0–46.0)
Hemoglobin: 13.4 g/dL (ref 12.0–15.0)
Immature Granulocytes: 0 %
Lymphocytes Relative: 19 %
Lymphs Abs: 1.7 10*3/uL (ref 0.7–4.0)
MCH: 32.1 pg (ref 26.0–34.0)
MCHC: 34.1 g/dL (ref 30.0–36.0)
MCV: 94.2 fL (ref 80.0–100.0)
Monocytes Absolute: 0.6 10*3/uL (ref 0.1–1.0)
Monocytes Relative: 7 %
Neutro Abs: 6.7 10*3/uL (ref 1.7–7.7)
Neutrophils Relative %: 71 %
Platelets: 233 10*3/uL (ref 150–400)
RBC: 4.17 MIL/uL (ref 3.87–5.11)
RDW: 12.1 % (ref 11.5–15.5)
WBC: 9.2 10*3/uL (ref 4.0–10.5)
nRBC: 0 % (ref 0.0–0.2)

## 2022-09-25 LAB — TROPONIN I (HIGH SENSITIVITY)
Troponin I (High Sensitivity): 17 ng/L (ref <18)
Troponin I (High Sensitivity): 13 ng/L (ref <18)

## 2022-09-25 LAB — LACTIC ACID, PLASMA
Lactic Acid, Venous: 1.5 mmol/L (ref 0.5–1.9)
Lactic Acid, Venous: 0.9 mmol/L (ref 0.5–1.9)

## 2022-09-25 MED ORDER — ONDANSETRON HCL 4 MG/2ML IJ SOLN
4.0000 mg | Freq: Once | INTRAMUSCULAR | Status: AC
  Administered 2022-09-25: 4 mg via INTRAVENOUS
  Filled 2022-09-25: qty 2

## 2022-09-25 MED ORDER — BARIUM SULFATE 2 % PO SUSP
ORAL | Status: AC
  Filled 2022-09-25: qty 1

## 2022-09-25 MED ORDER — FENTANYL CITRATE PF 50 MCG/ML IJ SOSY
50.0000 ug | PREFILLED_SYRINGE | Freq: Once | INTRAMUSCULAR | Status: AC
  Administered 2022-09-25: 50 ug via INTRAVENOUS
  Filled 2022-09-25: qty 1

## 2022-09-25 NOTE — ED Notes (Signed)
Dr Quentin Ore in room with pt.

## 2022-09-25 NOTE — ED Provider Notes (Signed)
[facility] EMERGENCY DEPARTMENT Provider Note   CSN: [id number] Arrival date & time: 09/25/22  1501     History  Chief Complaint  Patient presents with   Chest Pain   Abdominal Pain    swelling    [name] [name] is a 81 y.o. female.   Chest Pain Associated symptoms: abdominal pain   Abdominal Pain Associated symptoms: chest pain   Patient presents with acute on chronic abdominal pain.  Reportedly has chronic hernias on her abdomen.  Chronically incarcerated.  Has been more pain.  No vomiting.  Not having bowel movements.  Feeling worse.  Also if his pain goes up into her chest.  Found to be bradycardic.  History of second-degree type I heart block.  No lightheadedness or dizziness.  No fevers.    Past Medical History:  Diagnosis Date   Anxiety    CAD (coronary artery disease)    Multivessel status post CABG June 2021 - LIMA to LAD, SVG to distal RCA, SVG to OM1   Chronic obstructive pulmonary disease, unspecified ([place])    Depression    Essential hypertension    GERD    History of pneumonia    Incarcerated ventral hernia    Osteoarthritis    Type 2 diabetes mellitus ([place])    Past Surgical History:  Procedure Laterality Date   CATARACT EXTRACTION Bilateral 2023   CORONARY ARTERY BYPASS GRAFT N/A 05/22/2020   Procedure: CORONARY ARTERY BYPASS GRAFTING (CABG) x3 LIMA TO LAD, SVG TO OM1, SVG TO DISTAL RIGHT;  Surgeon: Lajuana Matte, MD;  Location: [place];  Service: Open Heart Surgery;  Laterality: N/A;   LEFT HEART CATH AND CORONARY ANGIOGRAPHY N/A 05/21/2020   Procedure: LEFT HEART CATH AND CORONARY ANGIOGRAPHY;  Surgeon: Martinique, Peter M, MD;  Location: [place] CV LAB;  Service: Cardiovascular;  Laterality: N/A;   NEPHRECTOMY     TEE WITHOUT CARDIOVERSION N/A 05/22/2020   Procedure: TRANSESOPHAGEAL ECHOCARDIOGRAM (TEE);  Surgeon: Lajuana Matte, MD;  Location: [place];  Service: Open Heart Surgery;  Laterality: N/A;   VEIN HARVEST Right 05/22/2020    Procedure: Open Vein Harvest;  Surgeon: Lajuana Matte, MD;  Location: [place];  Service: Open Heart Surgery;  Laterality: Right;   VENTRAL HERNIA REPAIR  09/01/2017   mistaken entry      Home Medications Prior to Admission medications   Medication Sig Start Date End Date Taking? Authorizing Provider  aspirin EC 81 MG tablet Take 1 tablet (81 mg total) by mouth daily. Swallow whole. 12/25/20  Yes Satira Sark, MD  atorvastatin (LIPITOR) 40 MG tablet TAKE 1 TABLET BY MOUTH AT BEDTIME. 07/25/22  Yes Satira Sark, MD  Biotin w/ Vitamins C & E (HAIR SKIN & NAILS GUMMIES PO) Take 2 each by mouth every morning.   Yes [provider]  cetirizine (ZYRTEC) 10 MG tablet Take 1 tablet (10 mg total) by mouth daily as needed (allergies). 05/20/22  Yes Paseda, Dewaine Conger, FNP  fish oil-omega-3 fatty acids 1000 MG capsule Take 1 g by mouth daily.   Yes [provider]  linagliptin (TRADJENTA) 5 MG TABS tablet Take 1 tablet (5 mg total) by mouth daily. 01/06/22  Yes Brita Romp, NP  metFORMIN (GLUCOPHAGE) 500 MG tablet Take 1 tablet (500 mg total) by mouth daily with breakfast. 01/06/22  Yes Brita Romp, NP  Multiple Vitamin (MULITIVITAMIN WITH MINERALS) TABS Take 1 tablet by mouth daily.   Yes [provider]  nitroGLYCERIN (NITROSTAT) 0.4 MG SL tablet Place 1 tablet (0.4 mg total) under the tongue every 5 (five) minutes x 3 doses as needed for chest pain (if no relief after 3rd dose, proceed to the ED or call 911). 01/02/22  Yes Satira Sark, MD  polyethylene glycol (MIRALAX / GLYCOLAX) 17 g packet Take 17 g by mouth daily as needed for mild constipation or moderate constipation.   Yes [provider]  Accu-Chek Softclix Lancets lancets Use as instructed 02/12/21   Brita Romp, NP  blood glucose meter kit and supplies Dispense based on patient and insurance preference. Use up to four times daily as directed. (FOR ICD-10 E10.9, E11.9).  11/28/20   Perlie Mayo, NP  glucose blood (ACCU-CHEK GUIDE) test strip Use as instructed to monitor glucose twice daily 12/04/21   Brita Romp, NP  glucose blood test strip Use as instructed to monitor glucose twice daily 08/28/21   Brita Romp, NP  prednisoLONE acetate (PRED FORTE) 1 % ophthalmic suspension  03/18/22   [provider]  metoprolol tartrate (LOPRESSOR) 25 MG tablet Take 0.5 tablets (12.5 mg total) by mouth 2 (two) times daily. 07/03/21 07/03/21  Murlean Iba, MD      Allergies    Iodinated contrast media, Codeine, Morphine and related, Other, and Penicillins    Review of Systems   Review of Systems  Cardiovascular:  Positive for chest pain.  Gastrointestinal:  Positive for abdominal pain.    Physical Exam Updated Vital Signs BP (!) 138/59   Pulse 66   Temp 98.8 F (37.1 C) (Oral)   Resp (!) 21   Ht 4' 11" (1.499 m)   Wt 63.5 kg   SpO2 95%   BMI 28.28 kg/m  Physical Exam Vitals and nursing note reviewed.  HENT:     Head: Normocephalic.  Cardiovascular:     Rate and Rhythm: Bradycardia present. Rhythm irregular.  Pulmonary:     Breath sounds: No wheezing, rhonchi or rales.  Abdominal:     Tenderness: There is abdominal tenderness.     Comments: Tenderness left abdomen.  Does have fullness with chronic hernia.  Not firm but is definitely tender.  Not reducible.  Musculoskeletal:     Right lower leg: No edema.     Left lower leg: Edema present.  Neurological:     Mental Status: She is alert.     ED Results / Procedures / Treatments   Labs (all labs ordered are listed, but only abnormal results are displayed) Labs Reviewed  COMPREHENSIVE METABOLIC PANEL - Abnormal; Notable for the following components:      Result Value   Glucose, Bld 166 (*)    Creatinine, Ser 1.18 (*)    GFR, Estimated 47 (*)    All other components within normal limits  LIPASE, BLOOD  CBC WITH DIFFERENTIAL/PLATELET  LACTIC ACID, PLASMA  LACTIC  ACID, PLASMA  TROPONIN I (HIGH SENSITIVITY)  TROPONIN I (HIGH SENSITIVITY)    EKG EKG Interpretation  Date/Time:  Thursday September 25 2022 15:22:47 EDT Ventricular Rate:  57 PR Interval:    QRS Duration: 90 QT Interval:  436 QTC Calculation: 424 R Axis:   104 Text Interpretation: * Critical Test Result: AV Block Sinus rhythm with 2nd degree A-V block (Mobitz I) Rightward axis Septal infarct , age undetermined Abnormal ECG When compared with ECG of 21-May-2022 12:35,  similar to prior Confirmed by Davonna Belling [phone]) on 09/25/2022 4:30:08 PM  Radiology CT ABDOMEN PELVIS  WO CONTRAST  Result Date: 09/25/2022 CLINICAL DATA:  Left-sided abdominal pain. EXAM: CT ABDOMEN AND PELVIS WITHOUT CONTRAST TECHNIQUE: Multidetector CT imaging of the abdomen and pelvis was performed following the standard protocol without IV contrast. RADIATION DOSE REDUCTION: This exam was performed according to the departmental dose-optimization program which includes automated exposure control, adjustment of the mA and/or kV according to patient size and/or use of iterative reconstruction technique. COMPARISON:  Abdominal radiograph performed the same day and CT abdomen pelvis dated 02/03/2021. FINDINGS: Lower chest: No acute abnormality. Hepatobiliary: No focal liver abnormality is seen. No gallstones, gallbladder wall thickening, or biliary dilatation. Pancreas: A 13 mm mass in the pancreatic tail is unchanged and presumed benign. No pancreatic ductal dilatation or surrounding inflammatory changes. Spleen: Normal in size without focal abnormality. Adrenals/Urinary Tract: Adrenal glands are unremarkable. The left kidney is surgically absent. The right kidney is normal, without renal calculi, focal lesion, or hydronephrosis. Bladder is unremarkable. Stomach/Bowel: Stomach is within normal limits. There are three abdominal wall hernias on the left which contain portions of small and large bowel, similar to prior exam in  size and location, however there is new fat stranding within the largest hernia sac adjacent to herniated small bowel (located in the left lower quadrant), concerning for bowel ischemia. The herniated portions of large bowel appear normal. Appendix appears normal. No evidence of bowel obstruction. Vascular/Lymphatic: Aortic atherosclerosis. No enlarged abdominal or pelvic lymph nodes. Reproductive: Uterus and bilateral adnexa are unremarkable. Other: No free intraperitoneal fluid. Musculoskeletal: Degenerative changes are seen in the spine. IMPRESSION: 1. Three abdominal wall hernias on the left, the largest of which demonstrates new fat stranding adjacent to herniated small bowel, concerning for bowel ischemia. Aortic Atherosclerosis (ICD10-I70.0). Electronically Signed   By: Zerita Boers M.D.   On: 09/25/2022 18:50   DG Abdomen Acute W/Chest  Result Date: 09/25/2022 CLINICAL DATA:  Abdominal pain, vomiting, hernia EXAM: DG ABDOMEN ACUTE WITH 1 VIEW CHEST COMPARISON:  Chest radiograph dated 05/21/2022. CT abdomen/pelvis dated 02/03/2021. FINDINGS: Lungs are clear.  No pleural effusion or pneumothorax. The heart is normal in size. Postsurgical changes related to prior CABG. Nonobstructive bowel gas pattern. No evidence of free air under the diaphragm on the upright view. Large left abdominal hernia. Median sternotomy.  Degenerative changes of the lumbar spine. IMPRESSION: No evidence of acute cardiopulmonary disease. Large left abdominal hernia. No evidence of small bowel obstruction or free air. Electronically Signed   By: Julian Hy M.D.   On: 09/25/2022 17:04    Procedures Procedures    Medications Ordered in ED Medications  barium (READI-CAT 2) 2 % suspension (has no administration in time range)  fentaNYL (SUBLIMAZE) injection 50 mcg (50 mcg Intravenous Given 09/25/22 2004)  ondansetron (ZOFRAN) injection 4 mg (4 mg Intravenous Given 09/25/22 2004)    ED Course/ Medical Decision  Making/ A&P                           Medical Decision Making Amount and/or Complexity of Data Reviewed Labs: ordered. Radiology: ordered.  Risk Prescription drug management.   Patient abdominal pain and chest pain.  Previous and chronic ventral hernia.  Now states more vomiting and more pain.  Also pain goes to the chest.  Differential diagnosis includes obstruction, chronic abdominal pain, with the chest pain also coronary issues.  Appears to be an second-degree type I heart block.  Is had this previously.  No lightheadedness or dizziness although does have  little bit of pain.  EKG reassuring from an ischemic standpoint.  We will get acute abdominal series to evaluate for obstruction but will likely need CT scan.  Also will get cardiac monitoring and give some pain medicine.  Also given nausea medicines. I have reviewed previous cardiology note.  Also reviewed results of previous CT scan of the abdomen pelvis.  CT scan done today and does show new potential fat stranding around the bowel and one of the inferior hernias.  Chronically incarcerated.  Otherwise does not show severe obstruction.  However with continued pain I think it is worth likely admission.  Will discuss with general surgery.  Discussed with Dr. Okey Dupre from general surgery.  Thinks the patient needs transfer to a tertiary referral center.  Lactic acid done and reassuring.  Discussed with Dr. Laurance Flatten from [facility] who accepted in transfer at the ER.  We will send by ambulance.  CT scan has been power shared to [facility].        Final Clinical Impression(s) / ED Diagnoses Final diagnoses:  Ventral hernia without obstruction or gangrene  Second degree heart block    Rx / DC Orders ED Discharge Orders     None         Davonna Belling, MD 09/25/22 2202

## 2022-09-25 NOTE — ED Notes (Signed)
Report given to Sam , Agricultural consultant at [facility]. Pt agreed to go there. Pt /family aware transport is on way

## 2022-09-25 NOTE — Telephone Encounter (Signed)
Patient's daughter called and mentioned that patient is in the ER and wants to know should she stay or what

## 2022-09-25 NOTE — ED Notes (Signed)
EDP at bedside updated pt and daughter. Nad. No changes
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
## 2022-09-25 NOTE — ED Notes (Signed)
See triage notes. A/o. Color wnl. Nad. Swelling noted to left abd, large amount which pt states it does swell at times. C/o abd swelling so bad that her left chest hurts. No new symptoms per pt.

## 2022-09-25 NOTE — Telephone Encounter (Signed)
Left message on daughters phone that they should stay in the ED to be evaluated as they are already there.

## 2022-09-25 NOTE — ED Triage Notes (Signed)
Pt reports left side pain that started Sunday afternoon. Pt has mass on left side that is normal for her. Pt vomited 4 times today. Pt reports chest stinging.   Dr. Davonna Belling given pt EKG.

## 2022-09-26 DIAGNOSIS — Z7984 Long term (current) use of oral hypoglycemic drugs: Secondary | ICD-10-CM | POA: Diagnosis not present

## 2022-09-26 DIAGNOSIS — J449 Chronic obstructive pulmonary disease, unspecified: Secondary | ICD-10-CM | POA: Diagnosis present

## 2022-09-26 DIAGNOSIS — G8929 Other chronic pain: Secondary | ICD-10-CM | POA: Diagnosis present

## 2022-09-26 DIAGNOSIS — Z951 Presence of aortocoronary bypass graft: Secondary | ICD-10-CM | POA: Diagnosis not present

## 2022-09-26 DIAGNOSIS — K558 Other vascular disorders of intestine: Secondary | ICD-10-CM | POA: Diagnosis not present

## 2022-09-26 DIAGNOSIS — I70201 Unspecified atherosclerosis of native arteries of extremities, right leg: Secondary | ICD-10-CM | POA: Diagnosis not present

## 2022-09-26 DIAGNOSIS — I2510 Atherosclerotic heart disease of native coronary artery without angina pectoris: Secondary | ICD-10-CM | POA: Diagnosis present

## 2022-09-26 DIAGNOSIS — Z91041 Radiographic dye allergy status: Secondary | ICD-10-CM | POA: Diagnosis not present

## 2022-09-26 DIAGNOSIS — I441 Atrioventricular block, second degree: Secondary | ICD-10-CM | POA: Diagnosis not present

## 2022-09-26 DIAGNOSIS — I771 Stricture of artery: Secondary | ICD-10-CM | POA: Diagnosis present

## 2022-09-26 DIAGNOSIS — E119 Type 2 diabetes mellitus without complications: Secondary | ICD-10-CM | POA: Diagnosis present

## 2022-09-26 DIAGNOSIS — K551 Chronic vascular disorders of intestine: Secondary | ICD-10-CM | POA: Diagnosis not present

## 2022-09-26 DIAGNOSIS — K8689 Other specified diseases of pancreas: Secondary | ICD-10-CM | POA: Diagnosis not present

## 2022-09-26 DIAGNOSIS — K439 Ventral hernia without obstruction or gangrene: Secondary | ICD-10-CM | POA: Diagnosis present

## 2022-09-26 DIAGNOSIS — Z905 Acquired absence of kidney: Secondary | ICD-10-CM | POA: Diagnosis not present

## 2022-09-26 DIAGNOSIS — K559 Vascular disorder of intestine, unspecified: Secondary | ICD-10-CM | POA: Diagnosis present

## 2022-09-26 DIAGNOSIS — K469 Unspecified abdominal hernia without obstruction or gangrene: Secondary | ICD-10-CM | POA: Diagnosis not present

## 2022-09-26 DIAGNOSIS — K219 Gastro-esophageal reflux disease without esophagitis: Secondary | ICD-10-CM | POA: Diagnosis present

## 2022-09-26 NOTE — ED Notes (Signed)
Called [facility] for update on transport of patient to ER there. [facility] line returned call, stating that they were extremely busy last night and that's why they did not come get her. [facility] is state they will not have a possible ETA for transport until after shift change @ 0730.

## 2022-09-27 DIAGNOSIS — I2510 Atherosclerotic heart disease of native coronary artery without angina pectoris: Secondary | ICD-10-CM | POA: Diagnosis present

## 2022-09-27 DIAGNOSIS — K559 Vascular disorder of intestine, unspecified: Secondary | ICD-10-CM | POA: Diagnosis present

## 2022-09-27 DIAGNOSIS — E119 Type 2 diabetes mellitus without complications: Secondary | ICD-10-CM | POA: Diagnosis present

## 2022-09-27 DIAGNOSIS — Z951 Presence of aortocoronary bypass graft: Secondary | ICD-10-CM | POA: Diagnosis not present

## 2022-09-27 DIAGNOSIS — K439 Ventral hernia without obstruction or gangrene: Secondary | ICD-10-CM | POA: Diagnosis present

## 2022-09-27 DIAGNOSIS — Z91041 Radiographic dye allergy status: Secondary | ICD-10-CM | POA: Diagnosis not present

## 2022-09-27 DIAGNOSIS — Z7984 Long term (current) use of oral hypoglycemic drugs: Secondary | ICD-10-CM | POA: Diagnosis not present

## 2022-09-27 DIAGNOSIS — G8929 Other chronic pain: Secondary | ICD-10-CM | POA: Diagnosis present

## 2022-09-27 DIAGNOSIS — K8689 Other specified diseases of pancreas: Secondary | ICD-10-CM | POA: Diagnosis not present

## 2022-09-27 DIAGNOSIS — J449 Chronic obstructive pulmonary disease, unspecified: Secondary | ICD-10-CM | POA: Diagnosis present

## 2022-09-27 DIAGNOSIS — I771 Stricture of artery: Secondary | ICD-10-CM | POA: Diagnosis present

## 2022-09-27 DIAGNOSIS — Z905 Acquired absence of kidney: Secondary | ICD-10-CM | POA: Diagnosis not present

## 2022-09-27 DIAGNOSIS — K219 Gastro-esophageal reflux disease without esophagitis: Secondary | ICD-10-CM | POA: Diagnosis present

## 2022-09-27 DIAGNOSIS — K551 Chronic vascular disorders of intestine: Secondary | ICD-10-CM | POA: Diagnosis not present

## 2022-09-27 DIAGNOSIS — I70201 Unspecified atherosclerosis of native arteries of extremities, right leg: Secondary | ICD-10-CM | POA: Diagnosis not present

## 2022-09-27 DIAGNOSIS — K558 Other vascular disorders of intestine: Secondary | ICD-10-CM | POA: Diagnosis not present

## 2022-10-01 ENCOUNTER — Telehealth: Payer: Self-pay | Admitting: Internal Medicine

## 2022-10-01 ENCOUNTER — Telehealth: Payer: Self-pay

## 2022-10-01 ENCOUNTER — Encounter: Payer: Self-pay | Admitting: Internal Medicine

## 2022-10-01 ENCOUNTER — Ambulatory Visit (INDEPENDENT_AMBULATORY_CARE_PROVIDER_SITE_OTHER): Payer: Medicare Other | Admitting: Internal Medicine

## 2022-10-01 DIAGNOSIS — K5900 Constipation, unspecified: Secondary | ICD-10-CM

## 2022-10-01 NOTE — Progress Notes (Signed)
   Acute Telephone Visit  Virtual Visit via Telephone Note  I connected with [name] on 10/01/22 at  4:40 PM EDT by telephone and verified that I am speaking with the correct person using two identifiers.  Location: Patient: [address], [place], [place] [address] Provider: [phone] [address] [place], [place] [address]   I discussed the limitations, risks, security and privacy concerns of performing an evaluation and management service by telephone and the availability of in person appointments. I also discussed with the patient that there may be a patient responsible charge related to this service. The patient expressed understanding and agreed to proceed.   History of Present Illness:  Ms. Koval is an 81 year old woman seen today via telephone encounter for an acute visit to discuss bowel issues.  She states that she has not had a bowel movement since she was discharged from [facility] on 10/16.  She was transferred to [facility] from [facility] after initially presenting for evaluation of abdominal pain.  She has multiple ventral hernias and was transferred to a tertiary facility for evaluation of suspected small bowel ischemia.  No surgery was performed.  Her diet was gradually advanced as pain was controlled.  She was discharged on 10/16.  Prior to discharge, she did have a bowel movement and was able to tolerate advances in her diet.  Ms. Kae Heller has continued to eat well since hospital discharge but states that she has not had a bowel movement.  Her pain is not significantly worse from baseline.  She continues to have flatus.  She additionally denies nausea/vomiting and fever/chills.  Her sister has purchased MiraLAX and a stool softener at [employer].  She has not tried taking his medications.  She has tried eating prunes and drinking hot coffee.  Assessment and Plan:  Constipation Patient calls today to discuss "bowel issues".  She states that she has  not had a bowel movement since hospital discharge on 10/16.  She has been drinking hot coffee and eating prunes but this has not been successful in helping her to have a bowel movement.  She does not have an urge to defecate.  Of significance, she did have a recent admission for suspected small bowel ischemia and multiple ventral hernias.  She did not undergo surgical intervention.  As previously noted, she continues to pass flatus.  I recommended that Ms. Wadle attempt to take the MiraLAX and stool softener that her daughter has purchased.  I encouraged her to stay hydrated as well.  I also offered to prescribe a Dulcolax suppository, but she declined.  If she does not have a bowel movement within the next day, she should contact our office for further recommendations.   Follow Up Instructions:  I discussed the assessment and treatment plan with the patient. The patient was provided an opportunity to ask questions and all were answered. The patient agreed with the plan and demonstrated an understanding of the instructions.   The patient was advised to call back or seek an in-person evaluation if the symptoms worsen or if the condition fails to improve as anticipated.  I provided 12 minutes of non-face-to-face time during this encounter.   Johnette Abraham, MD

## 2022-10-01 NOTE — Telephone Encounter (Signed)
Daughter called in on patient behalf.  Patient was admitted to [facility] and transferred to [facility].  Discharged on 10/16  [facility] appt made 10/25  TOC call needed.

## 2022-10-01 NOTE — Telephone Encounter (Signed)
Transition Care Management Follow-up Telephone Call Date of discharge and from where: 09/29/2022 from [place] How have you been since you were released from the hospital? good Any questions or concerns? No  Items Reviewed: Did the pt receive and understand the discharge instructions provided? Yes  Medications obtained and verified? Yes  Other? No  Any new allergies since your discharge? No  Dietary orders reviewed? No Do you have support at home? Yes   Home Care and Equipment/Supplies: Were home health services ordered? no If so, what is the name of the agency? N/a  Has the agency set up a time to come to the patient's home? not applicable Were any new equipment or medical supplies ordered?  No What is the name of the medical supply agency? N/a Were you able to get the supplies/equipment? not applicable Do you have any questions related to the use of the equipment or supplies? No  Functional Questionnaire: (I = Independent and D = Dependent) ADLs: i  Bathing/Dressing- i  Meal Prep- i  Eating- i  Maintaining continence- i  Transferring/Ambulation- i  Managing Meds- i  Follow up appointments reviewed:  PCP Hospital f/u appt confirmed? Yes  Scheduled to see Dr. Doren Custard on 10/08/2022 @ 3:00. [place] [facility] f/u appt confirmed? No   Are transportation arrangements needed? No  If their condition worsens, is the pt aware to call PCP or go to the Emergency Dept.? Yes Was the patient provided with contact information for the PCP's office or ED? Yes Was to pt encouraged to call back with questions or concerns? Yes

## 2022-10-01 NOTE — Telephone Encounter (Signed)
TOC call made 

## 2022-10-08 ENCOUNTER — Ambulatory Visit (INDEPENDENT_AMBULATORY_CARE_PROVIDER_SITE_OTHER): Payer: Medicare Other | Admitting: Internal Medicine

## 2022-10-08 ENCOUNTER — Encounter: Payer: Self-pay | Admitting: Internal Medicine

## 2022-10-08 VITALS — BP 106/64 | HR 96 | Ht 59.0 in | Wt 139.6 lb

## 2022-10-08 DIAGNOSIS — R1909 Other intra-abdominal and pelvic swelling, mass and lump: Secondary | ICD-10-CM

## 2022-10-08 DIAGNOSIS — R1900 Intra-abdominal and pelvic swelling, mass and lump, unspecified site: Secondary | ICD-10-CM | POA: Diagnosis not present

## 2022-10-08 DIAGNOSIS — K5900 Constipation, unspecified: Secondary | ICD-10-CM | POA: Diagnosis not present

## 2022-10-08 MED ORDER — BISACODYL 5 MG PO TBEC: 5.0000 mg | DELAYED_RELEASE_TABLET | Freq: Every day | ORAL | 0 refills | Status: DC | PRN

## 2022-10-08 NOTE — Progress Notes (Signed)
Established Patient Office Visit  Subjective   Patient ID: [name] [name], female    DOB: 08-Mar-1941  Age: 81 y.o. MRN: [id number]  Chief Complaint  Patient presents with   Transitions Of Care    D/c 10/16   [name] [name] presents today for transition of care appointment.  She is an 81 year old woman with a past medical history significant for TIA, PAD, HTN, GERD, T2DM, CKD 3B, and multiple large abdominal wall hernias.  She was last evaluated by me via acute telephone encounter on 10/18 to discuss bowel issues.  She stated that she had not had a bowel movement since hospital discharge on 10/16.  I recommended daily use of MiraLAX and a stool softener.  There have been no acute interval events.  [name] [name] was discharged from [facility] on 10/16 after initially presenting to [facility] for left lower quadrant abdominal pain.  A CT scan was ordered and demonstrated multiple abdominal wall hernias.  She was evaluated by general surgery at [facility] and ultimately transferred to a tertiary care facility due to concern for small bowel ischemia.  She did not undergo surgery at [facility].  Her pain was controlled, diet gradually advanced, and she had a bowel movement prior to discharge.  Today she became very tearful when discussing her recent admission to [facility], noting that the surgery team was abrasive and did not listen to her concerns.  There have not been any acute issues since hospital discharge.  She has had 2 bowel movements since I last spoke with her on 10/18.  She states that her bowel movements are rather small compared to normal bowel movements.  She continues to feel bloated.  [name] [name] states that she has been eating fairly well.  She continues to pass flatus.  Past Medical History:  Diagnosis Date   Anxiety    CAD (coronary artery disease)    Multivessel status post CABG June 2021 - LIMA to LAD, SVG to distal RCA, SVG to OM1   Chronic obstructive pulmonary  disease, unspecified ([place])    Depression    Essential hypertension    GERD    History of pneumonia    Incarcerated ventral hernia    Osteoarthritis    Type 2 diabetes mellitus ([place])    Past Surgical History:  Procedure Laterality Date   CATARACT EXTRACTION Bilateral 2023   CORONARY ARTERY BYPASS GRAFT N/A 05/22/2020   Procedure: CORONARY ARTERY BYPASS GRAFTING (CABG) x3 LIMA TO LAD, SVG TO OM1, SVG TO DISTAL RIGHT;  Surgeon: Lajuana Matte, MD;  Location: [place];  Service: Open Heart Surgery;  Laterality: N/A;   LEFT HEART CATH AND CORONARY ANGIOGRAPHY N/A 05/21/2020   Procedure: LEFT HEART CATH AND CORONARY ANGIOGRAPHY;  Surgeon: Martinique, Peter M, MD;  Location: [place] CV LAB;  Service: Cardiovascular;  Laterality: N/A;   NEPHRECTOMY     TEE WITHOUT CARDIOVERSION N/A 05/22/2020   Procedure: TRANSESOPHAGEAL ECHOCARDIOGRAM (TEE);  Surgeon: Lajuana Matte, MD;  Location: [place];  Service: Open Heart Surgery;  Laterality: N/A;   VEIN HARVEST Right 05/22/2020   Procedure: Open Vein Harvest;  Surgeon: Lajuana Matte, MD;  Location: [place];  Service: Open Heart Surgery;  Laterality: Right;   VENTRAL HERNIA REPAIR  09/01/2017   mistaken entry   Social History   Tobacco Use   Smoking status: Never   Smokeless tobacco: Never  Vaping Use   Vaping Use: Never used  Substance Use Topics  Alcohol use: No   Drug use: No   Family History  Problem Relation Age of Onset   CAD Father    Heart failure Sister    CAD Daughter    Heart disease Daughter    Allergies  Allergen Reactions   Iodinated Contrast Media Other (See Comments)    Unknown reaction. Due to kidney issues. Do not use Unknown reaction. Due to kidney issues. Do not use   Codeine Rash   Morphine And Related Other (See Comments) and Rash    Unknown Reaction Unknown Reaction   Other     Pt has multiple allergies per pt but unsure of all.    Penicillins Other (See Comments)    Unknown reaction Unknown  reaction   Review of Systems  Constitutional:  Negative for chills and fever.  Gastrointestinal:  Positive for constipation. Negative for abdominal pain, blood in stool, diarrhea, melena, nausea and vomiting.  All other systems reviewed and are negative.    Objective:     BP 106/64   Pulse 96   Ht 4\' 11"  (1.499 m)   Wt 139 lb 9.6 oz (63.3 kg)   SpO2 96%   BMI 28.20 kg/m  BP Readings from Last 3 Encounters:  10/08/22 106/64  09/26/22 (!) 123/51  09/09/22 130/80      Physical Exam Vitals reviewed.  Constitutional:      General: She is not in acute distress.    Appearance: Normal appearance. She is not toxic-appearing.     Comments: Tearful during encounter when discussing her recent hospital admission  HENT:     Head: Normocephalic and atraumatic.     Right Ear: External ear normal.     Left Ear: External ear normal.     Nose: Nose normal. No congestion or rhinorrhea.     Mouth/Throat:     Mouth: Mucous membranes are moist.     Pharynx: Oropharynx is clear. No oropharyngeal exudate or posterior oropharyngeal erythema.  Eyes:     General: No scleral icterus.    Extraocular Movements: Extraocular movements intact.     Conjunctiva/sclera: Conjunctivae normal.     Pupils: Pupils are equal, round, and reactive to light.  Cardiovascular:     Rate and Rhythm: Normal rate and regular rhythm.     Pulses: Normal pulses.     Heart sounds: Normal heart sounds. No murmur heard.    No friction rub. No gallop.  Pulmonary:     Effort: Pulmonary effort is normal.     Breath sounds: Normal breath sounds. No wheezing, rhonchi or rales.  Abdominal:     General: Bowel sounds are normal. There is no distension.     Palpations: Abdomen is soft. There is mass (Large left lower quadrant mass).     Tenderness: There is no abdominal tenderness.  Musculoskeletal:        General: No swelling. Normal range of motion.     Cervical back: Normal range of motion.     Right lower leg: No edema.      Left lower leg: No edema.  Lymphadenopathy:     Cervical: No cervical adenopathy.  Skin:    General: Skin is warm and dry.     Capillary Refill: Capillary refill takes less than 2 seconds.     Coloration: Skin is not jaundiced.  Neurological:     General: No focal deficit present.     Mental Status: She is alert and oriented to person, place, and time.  Psychiatric:  Mood and Affect: Mood normal.        Behavior: Behavior normal.    Last CBC Lab Results  Component Value Date   WBC 9.2 09/25/2022   HGB 13.4 09/25/2022   HCT 39.3 09/25/2022   MCV 94.2 09/25/2022   MCH 32.1 09/25/2022   RDW 12.1 09/25/2022   PLT 233 09/25/2022   Last metabolic panel Lab Results  Component Value Date   GLUCOSE 166 (H) 09/25/2022   NA 137 09/25/2022   K 4.4 09/25/2022   CL 102 09/25/2022   CO2 27 09/25/2022   BUN 23 09/25/2022   CREATININE 1.18 (H) 09/25/2022   GFRNONAA 47 (L) 09/25/2022   CALCIUM 9.3 09/25/2022   PROT 7.8 09/25/2022   ALBUMIN 4.1 09/25/2022   LABGLOB 2.3 11/21/2021   AGRATIO 2.0 11/21/2021   BILITOT 0.6 09/25/2022   ALKPHOS 61 09/25/2022   AST 25 09/25/2022   ALT 27 09/25/2022   ANIONGAP 8 09/25/2022   Last lipids Lab Results  Component Value Date   CHOL 90 04/07/2022   HDL 36 (L) 04/07/2022   LDLCALC 30 04/07/2022   TRIG 120 04/07/2022   CHOLHDL 2.5 04/07/2022   Last hemoglobin A1c Lab Results  Component Value Date   HGBA1C 5.6 09/09/2022   Last thyroid functions Lab Results  Component Value Date   TSH 2.469 05/21/2022   Last vitamin D Lab Results  Component Value Date   VD25OH 113.0 (H) 08/22/2021     Assessment & Plan:   Problem List Items Addressed This Visit       Abdominal wall mass of left flank    Recent hospital admission with discharge on 10/16 in the setting of left lower quadrant abdominal pain.  She initially presented to [facility] and was noted to have 3 large abdominal wall hernias.  She was evaluated by general  surgery at [facility], who was concerned for small bowel ischemia.  She was then transferred to [facility] for further evaluation.  No indication for surgery was identified.  Her pain was controlled, diet advanced, and she had a bowel movement prior to hospital discharge.  She continues to endorse difficulty with having regular bowel movements since discharge.  Her last bowel movement was on Saturday.  She is currently taking MiraLAX and Colace. -I recommended that Ms. Carano continue to use MiraLAX daily and ensure adequate hydration.  She continue to take Colace as well.  I have also prescribed a Dulcolax.  She is adamantly against suppositories or enemas.  I informed her that this may be a necessary next step if there is no improvement in bowel regularity.  She expressed understanding.  We have tentatively arranged follow-up for one month, however I instructed her to contact our office for an acute appointment if there is no improvement in her bowel habits over the next week.      Return in about 4 weeks (around 11/05/2022).    Billie Lade, MD

## 2022-10-08 NOTE — Assessment & Plan Note (Addendum)
Recent hospital admission with discharge on 10/16 in the setting of left lower quadrant abdominal pain.  She initially presented to [facility] and was noted to have 3 large abdominal wall hernias.  She was evaluated by general surgery at [facility], who was concerned for small bowel ischemia.  She was then transferred to [facility] for further evaluation.  No indication for surgery was identified.  Her pain was controlled, diet advanced, and she had a bowel movement prior to hospital discharge.  She continues to endorse difficulty with having regular bowel movements since discharge.  Her last bowel movement was on Saturday.  She is currently taking MiraLAX and Colace. -I recommended that Ms. Grammatico continue to use MiraLAX daily and ensure adequate hydration.  She continue to take Colace as well.  I have also prescribed a Dulcolax.  She is adamantly against suppositories or enemas.  I informed her that this may be a necessary next step if there is no improvement in bowel regularity.  She expressed understanding.  We have tentatively arranged follow-up for one month, however I instructed her to contact our office for an acute appointment if there is no improvement in her bowel habits over the next week.

## 2022-10-08 NOTE — Patient Instructions (Signed)
It was a pleasure to see you today.  Thank you for giving [place] the opportunity to be involved in your care.  Below is a brief recap of your visit and next steps.  We will plan to see you again in 1 month.  Summary I have added Dulcolax today to your bowel regimen. Continue to take miralax daily, drink water, and increase your fiber intake. Follow up in 1 month.

## 2022-10-09 ENCOUNTER — Telehealth: Payer: Self-pay | Admitting: Cardiology

## 2022-10-09 ENCOUNTER — Telehealth: Payer: Self-pay

## 2022-10-09 NOTE — Telephone Encounter (Signed)
Pt c/o medication issue:  1. Name of Medication: Ciocolax   2. How are you currently taking this medication (dosage and times per day)? Not sure   3. Are you having a reaction (difficulty breathing--STAT)? no  4. What is your medication issue?  Pt daughter called wanting to know if its okay for pt to take this medication

## 2022-10-09 NOTE — Telephone Encounter (Signed)
Ed needs to know what medicine dulicalnx to take that the provider went over yesterday in her visit. And can patient take with all her other medicines.  [phone]

## 2022-10-09 NOTE — Telephone Encounter (Signed)
Daughter states that the patient was given a generic form of dulcolax to take once a day by her PCP. States that she is already taking miralax and a stool softener. Wants to know is it ok for the patient to be taking all of this medication. Please advise.

## 2022-10-09 NOTE — Telephone Encounter (Signed)
Daughter made aware and states that she will contact the PCP and also discuss with the pharmacist.

## 2022-10-10 NOTE — Telephone Encounter (Signed)
Pt aware to take the dulcolax daily prn for moderate constipation and continue the miralax and colace daily

## 2022-10-13 ENCOUNTER — Telehealth: Payer: Self-pay

## 2022-10-13 NOTE — Telephone Encounter (Signed)
Returned pt call  

## 2022-10-13 NOTE — Telephone Encounter (Signed)
Patient daughter [name] [name] called asked can patient take Milk of Magnesium, patient daughter asked if nurse would give her a call back.

## 2022-10-27 ENCOUNTER — Ambulatory Visit (INDEPENDENT_AMBULATORY_CARE_PROVIDER_SITE_OTHER): Payer: Medicare Other | Admitting: Internal Medicine

## 2022-10-27 ENCOUNTER — Encounter: Payer: Self-pay | Admitting: Internal Medicine

## 2022-10-27 DIAGNOSIS — K5900 Constipation, unspecified: Secondary | ICD-10-CM | POA: Diagnosis not present

## 2022-10-27 DIAGNOSIS — U071 COVID-19: Secondary | ICD-10-CM | POA: Diagnosis not present

## 2022-10-27 MED ORDER — GUAIFENESIN-DM 100-10 MG/5ML PO SYRP: 5.0000 mL | ORAL_SOLUTION | ORAL | 0 refills | Status: AC | PRN

## 2022-10-27 NOTE — Progress Notes (Signed)
   Acute Telephone Visit  Virtual Visit via Telephone Note  I connected with [name] [name] on 10/27/22 at  1:00 PM EST by telephone and verified that I am speaking with the correct person using two identifiers.  Location: Patient: [address] [name], [place] [address] Provider: [address], [place] [address]   I discussed the limitations, risks, security and privacy concerns of performing an evaluation and management service by telephone and the availability of in person appointments. I also discussed with the patient that there may be a patient responsible charge related to this service. The patient expressed understanding and agreed to proceed.  History of Present Illness:  [name] is evaluated via telephone encounter today after recently testing positive for COVID-19.  She reports onset of symptoms 11/3, which include cough productive of yellow sputum, chills, and sinus congestion.  She subsequently tested positive for COVID-19 on 11/6.  Of note, her daughter currently has COVID-19 as well.  Ms. Payment has been taking "long-lasting cough relief" medication that she purchased at the [employer].  She is unvaccinated.  She currently denies shortness of breath.  She believes she has a fever, but has not checked her temperature.  He is interested in additional medication options for symptom relief.  [name] also reports persistent bowel issues.  She has 3 large abdominal wall hernias and has previously endorsed difficulty with having regular bowel movements.  She has been using stool softeners and MiraLAX.  She has even purchased milk of magnesia.  She states that she has a very hard stool and difficulty going regularly.  She can feel stool in the rectal vault, but has difficulty expelling it.  Assessment and Plan:  COVID-19 Evaluated today after recent testing positive for COVID-19.  She is unvaccinated.  She has been managing her symptoms with cough medication purchased at the [employer].  She is out of the time window for treatment with COVID-19 specific medication.  I recommended continued supportive care measures including adequate hydration and as needed use of Tylenol and cough relief medication.  She requests an additional medication for cough clinic today.  I have prescribed Robitussin-DM.  She denies shortness of breath and states that she is able to perform ADLs independently.  I instructed [name] to present for in person evaluation if she develops significant fatigue or shortness of breath.  Constipation Chronic issue.  She has 3 large abdominal wall hernias.  I have previously recommended that she use MiraLAX and adequately hydrate in order to maintain bowel regularity.  She is currently using milk of magnesia as needed and describes hard stool in her rectal vault.  I am concerned that she remains constipated and has had an elevated risk for impaction.  I recommended that she present to the emergency department for evaluation if she does not have a large bowel movement soon.  She expressed understanding.    Follow Up Instructions:  I discussed the assessment and treatment plan with the patient. The patient was provided an opportunity to ask questions and all were answered. The patient agreed with the plan and demonstrated an understanding of the instructions.   The patient was advised to call back or seek an in-person evaluation if the symptoms worsen or if the condition fails to improve as anticipated.  I provided 13 minutes of non-face-to-face time during this encounter.   Billie Lade, MD

## 2022-10-28 NOTE — Progress Notes (Deleted)
Cardiology Office Note  Date: 10/28/2022   ID: [name] [name], DOB 07-24-1941, MRN [id number]  PCP:  [name] Abraham, MD  Cardiologist:  [name] Lesches, MD Electrophysiologist:  None   No chief complaint on file.   History of Present Illness: [name] [name] is an 81 y.o. female last seen in July.  ZIO monitor from August is noted below.  Past Medical History:  Diagnosis Date   Anxiety    CAD (coronary artery disease)    Multivessel status post CABG June 2021 - LIMA to LAD, SVG to distal RCA, SVG to OM1   Chronic obstructive pulmonary disease, unspecified ([name] [name])    Depression    Essential hypertension    GERD    History of pneumonia    Incarcerated ventral hernia    Osteoarthritis    Type 2 diabetes mellitus ([name] [name])     Past Surgical History:  Procedure Laterality Date   CATARACT EXTRACTION Bilateral 2023   CORONARY ARTERY BYPASS GRAFT N/A 05/22/2020   Procedure: CORONARY ARTERY BYPASS GRAFTING (CABG) x3 LIMA TO LAD, SVG TO OM1, SVG TO DISTAL RIGHT;  Surgeon: [name] Matte, MD;  Location: [place];  Service: Open Heart Surgery;  Laterality: N/A;   LEFT HEART CATH AND CORONARY ANGIOGRAPHY N/A 05/21/2020   Procedure: LEFT HEART CATH AND CORONARY ANGIOGRAPHY;  Surgeon: Martinique, Peter M, MD;  Location: [facility];  Service: Cardiovascular;  Laterality: N/A;   NEPHRECTOMY     TEE WITHOUT CARDIOVERSION N/A 05/22/2020   Procedure: TRANSESOPHAGEAL ECHOCARDIOGRAM (TEE);  Surgeon: [name] Matte, MD;  Location: [place];  Service: Open Heart Surgery;  Laterality: N/A;   VEIN HARVEST Right 05/22/2020   Procedure: Open Vein Harvest;  Surgeon: [name] Matte, MD;  Location: [place];  Service: Open Heart Surgery;  Laterality: Right;   VENTRAL HERNIA REPAIR  09/01/2017   mistaken entry    Current Outpatient Medications  Medication Sig Dispense Refill   Accu-Chek Softclix Lancets lancets Use as instructed 100 each 12   aspirin EC 81 MG tablet Take 1 tablet  (81 mg total) by mouth daily. Swallow whole. 90 tablet 3   atorvastatin (LIPITOR) 40 MG tablet TAKE 1 TABLET BY MOUTH AT BEDTIME. 30 tablet 6   Biotin w/ Vitamins C & E (HAIR SKIN & NAILS GUMMIES PO) Take 2 each by mouth every morning.     bisacodyl (DULCOLAX) 5 MG EC tablet Take 1 tablet (5 mg total) by mouth daily as needed for moderate constipation. 30 tablet 0   blood glucose meter kit and supplies Dispense based on patient and insurance preference. Use up to four times daily as directed. (FOR ICD-10 E10.9, E11.9). 1 each 0   cetirizine (ZYRTEC) 10 MG tablet Take 1 tablet (10 mg total) by mouth daily as needed (allergies). 30 tablet 3   fish oil-omega-3 fatty acids 1000 MG capsule Take 1 g by mouth daily.     glucose blood (ACCU-CHEK GUIDE) test strip Use as instructed to monitor glucose twice daily 100 each 12   glucose blood test strip Use as instructed to monitor glucose twice daily 100 each 1   guaiFENesin-dextromethorphan (ROBITUSSIN DM) 100-10 MG/5ML syrup Take 5 mLs by mouth every 4 (four) hours as needed for cough. 118 mL 0   linagliptin (TRADJENTA) 5 MG TABS tablet Take 1 tablet (5 mg total) by mouth daily. 90 tablet 3   metFORMIN (GLUCOPHAGE) 500 MG tablet Take 1 tablet (500 mg total) by mouth daily  with breakfast. 90 tablet 3   Multiple Vitamin (MULITIVITAMIN WITH MINERALS) TABS Take 1 tablet by mouth daily.     nitroGLYCERIN (NITROSTAT) 0.4 MG SL tablet Place 1 tablet (0.4 mg total) under the tongue every 5 (five) minutes x 3 doses as needed for chest pain (if no relief after 3rd dose, proceed to the ED or call 911). 25 tablet 3   polyethylene glycol (MIRALAX / GLYCOLAX) 17 g packet Take 17 g by mouth daily as needed for mild constipation or moderate constipation.     prednisoLONE acetate (PRED FORTE) 1 % ophthalmic suspension      No current facility-administered medications for this visit.   Allergies:  Iodinated contrast media, Codeine, Morphine and related, Other, and  Penicillins   Social History: The patient  reports that she has never smoked. She has never used smokeless tobacco. She reports that she does not drink alcohol and does not use drugs.   Family History: The patient's family history includes CAD in her daughter and father; Heart disease in her daughter; Heart failure in her sister.   ROS:  Please see the history of present illness. Otherwise, complete review of systems is positive for {NONE DEFAULTED:18576}.  All other systems are reviewed and negative.   Physical Exam: VS:  There were no vitals taken for this visit., BMI There is no height or weight on file to calculate BMI.  Wt Readings from Last 3 Encounters:  10/08/22 139 lb 9.6 oz (63.3 kg)  09/25/22 140 lb (63.5 kg)  09/09/22 188 lb (85.3 kg)    General: Patient appears comfortable at rest. HEENT: Conjunctiva and lids normal, oropharynx clear with moist mucosa. Neck: Supple, no elevated JVP or carotid bruits, no thyromegaly. Lungs: Clear to auscultation, nonlabored breathing at rest. Cardiac: Regular rate and rhythm, no S3 or significant systolic murmur, no pericardial rub. Abdomen: Soft, nontender, no hepatomegaly, bowel sounds present, no guarding or rebound. Extremities: No pitting edema, distal pulses 2+. Skin: Warm and dry. Musculoskeletal: No kyphosis. Neuropsychiatric: Alert and oriented x3, affect grossly appropriate.  ECG:  An ECG dated 09/25/2022 was personally reviewed today and demonstrated:  Sinus rhythm with second-degree type I heart block.  Recent Labwork: 05/21/2022: Magnesium 1.9; TSH 2.469 09/25/2022: ALT 27; AST 25; BUN 23; Creatinine, Ser 1.18; Hemoglobin 13.4; Platelets 233; Potassium 4.4; Sodium 137     Component Value Date/Time   CHOL 90 04/07/2022 1226   CHOL 121 11/21/2021 1115   TRIG 120 04/07/2022 1226   HDL 36 (L) 04/07/2022 1226   HDL 39 (L) 11/21/2021 1115   CHOLHDL 2.5 04/07/2022 1226   VLDL 24 04/07/2022 1226   LDLCALC 30 04/07/2022 1226    LDLCALC 55 11/21/2021 1115   LDLCALC 109 (H) 03/07/2020 1137    Other Studies Reviewed Today:  Carotid Dopplers 07/01/2021: IMPRESSION: 1. Right carotid artery system: Less than 50% stenosis secondary to mild multifocal atherosclerotic plaque formation.   2. Left carotid artery system: Less than 50% stenosis secondary to moderate multifocal atherosclerotic plaque formation about the carotid bulb.   3.  Vertebral artery system: Patent with antegrade flow bilaterally.   Echocardiogram 07/02/2021:  1. Left ventricular ejection fraction, by estimation, is 55 to 60%. The  left ventricle has normal function. The left ventricle has no regional  wall motion abnormalities. There is mild left ventricular hypertrophy.  Left ventricular diastolic parameters  were normal.   2. Right ventricular systolic function is normal. The right ventricular  size is normal. There is normal  pulmonary artery systolic pressure. The  estimated right ventricular systolic pressure is 73.7 mmHg.   3. The mitral valve is grossly normal. Trivial mitral valve  regurgitation.   4. The aortic valve is tricuspid. There is mild calcification of the  aortic valve. Aortic valve regurgitation is not visualized. Mild aortic  valve sclerosis is present, with no evidence of aortic valve stenosis.   5. The inferior vena cava is normal in size with greater than 50%  respiratory variability, suggesting right atrial pressure of 3 mmHg.   Cardiac monitor August 2023: ZIO XT reviewed.  6 days, 11 hours analyzed.   Predominant rhythm is sinus with prolonged PR interval.  Heart rate ranged from 38 bpm up to 108 bpm with average heart rate 61 bpm. There were rare PACs representing less than 1% total beats. There were rare PVCs including ventricular couplets representing less than 1% total beats. Rare and brief episodes of second-degree type I heart block were noted.  There was a 3.2-second pause associated with this as well as a  dropped sinus beat.  This was not a patient triggered event.  Assessment and Plan:    Medication Adjustments/Labs and Tests Ordered: Current medicines are reviewed at length with the patient today.  Concerns regarding medicines are outlined above.   Tests Ordered: No orders of the defined types were placed in this encounter.   Medication Changes: No orders of the defined types were placed in this encounter.   Disposition:  Follow up {follow up:15908}  Signed, Satira Sark, MD, [facility] 10/28/2022 12:56 PM    [facility] [place], [place], [place] [address] Phone: [phone]; Fax: [phone]

## 2022-10-30 ENCOUNTER — Ambulatory Visit: Payer: Medicare Other | Admitting: Cardiology

## 2022-10-30 DIAGNOSIS — I441 Atrioventricular block, second degree: Secondary | ICD-10-CM

## 2022-10-31 ENCOUNTER — Encounter: Payer: Self-pay | Admitting: Internal Medicine

## 2022-10-31 ENCOUNTER — Ambulatory Visit (INDEPENDENT_AMBULATORY_CARE_PROVIDER_SITE_OTHER): Payer: Medicare Other | Admitting: Internal Medicine

## 2022-10-31 DIAGNOSIS — J189 Pneumonia, unspecified organism: Secondary | ICD-10-CM

## 2022-10-31 MED ORDER — AZITHROMYCIN 250 MG PO TABS: ORAL_TABLET | ORAL | 0 refills | Status: DC

## 2022-10-31 NOTE — Progress Notes (Signed)
   Acute Telephone Visit  Virtual Visit via Telephone Note  I connected with [name] [name] on 10/31/22 at 10:40 AM EST by telephone and verified that I am speaking with the correct person using two identifiers.  Location: Patient: [address] [place], [place] [address] Provider: [address], [place], [place] [address]   I discussed the limitations, risks, security and privacy concerns of performing an evaluation and management service by telephone and the availability of in person appointments. I also discussed with the patient that there may be a patient responsible charge related to this service. The patient expressed understanding and agreed to proceed.  History of Present Illness:  Ms. Gammage is evaluated today for an acute telephone visit due to persistent cough with sputum production.  She reports onset of cough with yellow/green sputum production on 11/3.  She subsequently tested positive for COVID-19 on 11/6.  I had a telephone encounter with her on 11/13 to review her current symptoms.  We discussed conservative treatment measures given that she had test positive for a viral infection and was outside of the treatment window for outpatient COVID specific therapy.  I prescribed Robitussin-DM for cough relief.  This was changed to Mucinex DM by her pharmacist.  Despite conservative treat measures, her cough is persisted this week.  She endorses subjective fever but has not checked her temperature.  She states that she is sleeping a lot.  She is interested in additional treatment options  Assessment and Plan:  Community-acquired pneumonia Recent COVID-19 She tested positive for COVID-19 on 11/6 and has been managing her symptoms with supportive measures, including Mucinex DM.  She continues to endorse a cough productive of yellow-green sputum.  While I suspect her symptoms are largely due to COVID-19, she is at risk for superimposed bacterial infection.  Accordingly, I have prescribed azithromycin  today.  She will follow-up if her symptoms worsen or fail to improve.   Follow Up Instructions:  I discussed the assessment and treatment plan with the patient. The patient was provided an opportunity to ask questions and all were answered. The patient agreed with the plan and demonstrated an understanding of the instructions.   The patient was advised to call back or seek an in-person evaluation if the symptoms worsen or if the condition fails to improve as anticipated.  I provided 5 minutes of non-face-to-face time during this encounter.   Billie Lade, MD

## 2022-11-10 ENCOUNTER — Ambulatory Visit: Payer: Medicare Other | Admitting: Internal Medicine

## 2022-11-10 ENCOUNTER — Encounter: Payer: Self-pay | Admitting: Internal Medicine

## 2022-11-11 ENCOUNTER — Emergency Department (HOSPITAL_COMMUNITY): Payer: Medicare Other

## 2022-11-11 ENCOUNTER — Emergency Department (HOSPITAL_COMMUNITY)
Admission: EM | Admit: 2022-11-11 | Discharge: 2022-11-11 | Disposition: A | Discharge status: Home / Self Care | Payer: Medicare Other | Attending: Emergency Medicine | Admitting: Emergency Medicine

## 2022-11-11 ENCOUNTER — Other Ambulatory Visit: Payer: Self-pay

## 2022-11-11 DIAGNOSIS — Z7982 Long term (current) use of aspirin: Secondary | ICD-10-CM | POA: Diagnosis not present

## 2022-11-11 DIAGNOSIS — K5900 Constipation, unspecified: Secondary | ICD-10-CM

## 2022-11-11 DIAGNOSIS — R109 Unspecified abdominal pain: Secondary | ICD-10-CM | POA: Insufficient documentation

## 2022-11-11 DIAGNOSIS — Z79899 Other long term (current) drug therapy: Secondary | ICD-10-CM | POA: Diagnosis not present

## 2022-11-11 DIAGNOSIS — E875 Hyperkalemia: Secondary | ICD-10-CM | POA: Diagnosis not present

## 2022-11-11 DIAGNOSIS — N289 Disorder of kidney and ureter, unspecified: Secondary | ICD-10-CM | POA: Insufficient documentation

## 2022-11-11 DIAGNOSIS — I2510 Atherosclerotic heart disease of native coronary artery without angina pectoris: Secondary | ICD-10-CM | POA: Diagnosis not present

## 2022-11-11 DIAGNOSIS — Z7984 Long term (current) use of oral hypoglycemic drugs: Secondary | ICD-10-CM | POA: Insufficient documentation

## 2022-11-11 DIAGNOSIS — E119 Type 2 diabetes mellitus without complications: Secondary | ICD-10-CM | POA: Diagnosis not present

## 2022-11-11 DIAGNOSIS — I10 Essential (primary) hypertension: Secondary | ICD-10-CM | POA: Insufficient documentation

## 2022-11-11 LAB — CBC
HCT: 35.9 % — ABNORMAL LOW (ref 36.0–46.0)
Hemoglobin: 11.8 g/dL — ABNORMAL LOW (ref 12.0–15.0)
MCH: 31.4 pg (ref 26.0–34.0)
MCHC: 32.9 g/dL (ref 30.0–36.0)
MCV: 95.5 fL (ref 80.0–100.0)
Platelets: 297 10*3/uL (ref 150–400)
RBC: 3.76 MIL/uL — ABNORMAL LOW (ref 3.87–5.11)
RDW: 12.5 % (ref 11.5–15.5)
WBC: 8.8 10*3/uL (ref 4.0–10.5)
nRBC: 0 % (ref 0.0–0.2)

## 2022-11-11 LAB — COMPREHENSIVE METABOLIC PANEL
ALT: 29 U/L (ref 0–44)
AST: 25 U/L (ref 15–41)
Albumin: 3.6 g/dL (ref 3.5–5.0)
Alkaline Phosphatase: 76 U/L (ref 38–126)
Anion gap: 8 (ref 5–15)
BUN: 24 mg/dL — ABNORMAL HIGH (ref 8–23)
CO2: 27 mmol/L (ref 22–32)
Calcium: 8.9 mg/dL (ref 8.9–10.3)
Chloride: 103 mmol/L (ref 98–111)
Creatinine, Ser: 1.47 mg/dL — ABNORMAL HIGH (ref 0.44–1.00)
GFR, Estimated: 36 mL/min — ABNORMAL LOW (ref >60)
Glucose, Bld: 119 mg/dL — ABNORMAL HIGH (ref 70–99)
Potassium: 5.3 mmol/L — ABNORMAL HIGH (ref 3.5–5.1)
Sodium: 138 mmol/L (ref 135–145)
Total Bilirubin: 0.5 mg/dL (ref 0.3–1.2)
Total Protein: 7.3 g/dL (ref 6.5–8.1)

## 2022-11-11 LAB — LIPASE, BLOOD: Lipase: 56 U/L — ABNORMAL HIGH (ref 11–51)

## 2022-11-11 LAB — URINALYSIS, ROUTINE W REFLEX MICROSCOPIC
Bacteria, UA: NONE SEEN
Bilirubin Urine: NEGATIVE
Glucose, UA: NEGATIVE mg/dL
Ketones, ur: NEGATIVE mg/dL
Nitrite: NEGATIVE
Protein, ur: NEGATIVE mg/dL
Specific Gravity, Urine: 1.005 (ref 1.005–1.030)
pH: 7 (ref 5.0–8.0)

## 2022-11-11 NOTE — ED Triage Notes (Signed)
Pt reports that she has been having constipation issues for several days with intermittent abd pain. Pt states she took milk of magnesia yesterday and had a liquid BM. Pt states the pain is relieved with a BM.

## 2022-11-11 NOTE — ED Provider Notes (Signed)
Medical screening examination/treatment/procedure(s) were conducted as a shared visit with non-physician practitioner(s) and myself.  I personally evaluated the patient during the encounter.  Clinical Impression:   Final diagnoses:  None    Patient has been seen for constipation today.  Her work-up is unremarkable, no leukocytosis, metabolic panel at baseline, EKG shows [facility], consistent with prior EKGs.  Recently had work-up at outside hospital for possible ischemic colitis, no findings on CT scan of concern today, has no large hernias, the patient is very well-appearing, vital signs without tachycardia fever or hypoxia or hypotension, stable for discharge, recommendations given for constipation   Eber Hong, MD 11/11/22 2220

## 2022-11-11 NOTE — ED Provider Notes (Signed)
[facility] EMERGENCY DEPARTMENT Provider Note   CSN: [id number] Arrival date & time: 11/11/22  1102     History  Chief Complaint  Patient presents with   Constipation    [name] [name] is a 81 y.o. female.  81 year old female presents today for evaluation of abdominal pain, constipation.  Patient was recently admitted to hospital for concern of bowel ischemia in the setting of multiple abdominal wall hernias.  She was transferred to [facility].  Did not undergo surgery.  Since being discharged has had couple visits with her PCP regarding constipation.  She has been passing flatus.  She states over the past week her constipation worsened.  Now requires milk of magnesia to have a bowel movement.  Most recently yesterday.  Denies fever, chills, nausea, vomiting.  Tolerating p.o. intake without difficulty.  She states she has had the abdominal wall hernias since her left nephrectomy in 1975.  The history is provided by the patient. No language interpreter was used.  Constipation Associated symptoms: abdominal pain   Associated symptoms: no nausea and no vomiting        Home Medications Prior to Admission medications   Medication Sig Start Date End Date Taking? Authorizing Provider  Accu-Chek Softclix Lancets lancets Use as instructed 02/12/21   Brita Romp, NP  aspirin EC 81 MG tablet Take 1 tablet (81 mg total) by mouth daily. Swallow whole. 12/25/20   Satira Sark, MD  atorvastatin (LIPITOR) 40 MG tablet TAKE 1 TABLET BY MOUTH AT BEDTIME. 07/25/22   Satira Sark, MD  azithromycin (ZITHROMAX Z-PAK) 250 MG tablet Take 2 tablets (500 mg) PO today, then 1 tablet (250 mg) PO daily x4 days. 10/31/22   Johnette Abraham, MD  Biotin w/ Vitamins C & E (HAIR SKIN & NAILS GUMMIES PO) Take 2 each by mouth every morning.    [provider]  bisacodyl (DULCOLAX) 5 MG EC tablet Take 1 tablet (5 mg total) by mouth daily as needed for moderate constipation. 10/08/22    Johnette Abraham, MD  blood glucose meter kit and supplies Dispense based on patient and insurance preference. Use up to four times daily as directed. (FOR ICD-10 E10.9, E11.9). 11/28/20   Perlie Mayo, NP  cetirizine (ZYRTEC) 10 MG tablet Take 1 tablet (10 mg total) by mouth daily as needed (allergies). 05/20/22   [place], Dewaine Conger, FNP  fish oil-omega-3 fatty acids 1000 MG capsule Take 1 g by mouth daily.    [provider]  glucose blood (ACCU-CHEK GUIDE) test strip Use as instructed to monitor glucose twice daily 12/04/21   Brita Romp, NP  glucose blood test strip Use as instructed to monitor glucose twice daily 08/28/21   Brita Romp, NP  guaiFENesin-dextromethorphan (ROBITUSSIN DM) 100-10 MG/5ML syrup Take 5 mLs by mouth every 4 (four) hours as needed for cough. 10/27/22   Johnette Abraham, MD  linagliptin (TRADJENTA) 5 MG TABS tablet Take 1 tablet (5 mg total) by mouth daily. 01/06/22   Brita Romp, NP  metFORMIN (GLUCOPHAGE) 500 MG tablet Take 1 tablet (500 mg total) by mouth daily with breakfast. 01/06/22   Brita Romp, NP  Multiple Vitamin (MULITIVITAMIN WITH MINERALS) TABS Take 1 tablet by mouth daily.    [provider]  nitroGLYCERIN (NITROSTAT) 0.4 MG SL tablet Place 1 tablet (0.4 mg total) under the tongue every 5 (five) minutes x 3 doses as needed for chest pain (if no relief after 3rd  dose, proceed to the ED or call 911). 01/02/22   Satira Sark, MD  polyethylene glycol (MIRALAX / GLYCOLAX) 17 g packet Take 17 g by mouth daily as needed for mild constipation or moderate constipation.    [provider]  prednisoLONE acetate (PRED FORTE) 1 % ophthalmic suspension  03/18/22   [provider]  metoprolol tartrate (LOPRESSOR) 25 MG tablet Take 0.5 tablets (12.5 mg total) by mouth 2 (two) times daily. 07/03/21 07/03/21  Murlean Iba, MD      Allergies    Iodinated contrast media, Codeine, Morphine and related,  Other, and Penicillins    Review of Systems   Review of Systems  Gastrointestinal:  Positive for abdominal pain and constipation. Negative for anal bleeding, blood in stool, nausea and vomiting.  Neurological:  Negative for light-headedness.  All other systems reviewed and are negative.   Physical Exam Updated Vital Signs BP (!) 141/64 (BP Location: Right Arm)   Pulse 82   Temp 98.3 F (36.8 C) (Oral)   Resp 16   Ht _0  (1.499 m)   Wt 59 kg   SpO2 100%   BMI 26.26 kg/m  Physical Exam Vitals and nursing note reviewed.  Constitutional:      General: She is not in acute distress.    Appearance: Normal appearance. She is not ill-appearing.  HENT:     Head: Normocephalic and atraumatic.     Nose: Nose normal.  Eyes:     General: No scleral icterus.    Extraocular Movements: Extraocular movements intact.     Conjunctiva/sclera: Conjunctivae normal.  Cardiovascular:     Rate and Rhythm: Normal rate and regular rhythm.     Pulses: Normal pulses.  Pulmonary:     Effort: Pulmonary effort is normal. No respiratory distress.     Breath sounds: Normal breath sounds. No wheezing or rales.  Abdominal:     General: There is no distension.     Tenderness: There is abdominal tenderness. There is no right CVA tenderness, left CVA tenderness, guarding or rebound.  Musculoskeletal:        General: Normal range of motion.     Cervical back: Normal range of motion.  Skin:    General: Skin is warm and dry.  Neurological:     General: No focal deficit present.     Mental Status: She is alert. Mental status is at baseline.     ED Results / Procedures / Treatments   Labs (all labs ordered are listed, but only abnormal results are displayed) Labs Reviewed  LIPASE, BLOOD - Abnormal; Notable for the following components:      Result Value   Lipase 56 (*)    All other components within normal limits  COMPREHENSIVE METABOLIC PANEL - Abnormal; Notable for the following components:    Potassium 5.3 (*)    Glucose, Bld 119 (*)    BUN 24 (*)    Creatinine, Ser 1.47 (*)    GFR, Estimated 36 (*)    All other components within normal limits  CBC - Abnormal; Notable for the following components:   RBC 3.76 (*)    Hemoglobin 11.8 (*)    HCT 35.9 (*)    All other components within normal limits  URINALYSIS, ROUTINE W REFLEX MICROSCOPIC - Abnormal; Notable for the following components:   Color, Urine COLORLESS (*)    Hgb urine dipstick MODERATE (*)    Leukocytes,Ua TRACE (*)    All other components within  normal limits    EKG None  Radiology No results found.  Procedures Procedures    Medications Ordered in ED Medications - No data to display  ED Course/ Medical Decision Making/ A&P                           Medical Decision Making Amount and/or Complexity of Data Reviewed Labs: ordered. Radiology: ordered.   Medical Decision Making / ED Course   This patient presents to the ED for concern of constipation, this involves an extensive number of treatment options, and is a complaint that carries with it a high risk of complications and morbidity.  The differential diagnosis includes small bowel obstruction, now ischemia, constipation, diverticulitis, pancreatitis  MDM: 81 year old female with past medical history as noted above presents today for evaluation of constipation.  Of note she was recently admitted for concern for bowel ischemia.  Evaluated at [place] after being transferred from [facility] ED.  Patient is well-appearing on exam.  Without guarding on exam.  Blood work overall reassuring.  Will obtain CT abdomen pelvis without contrast with history of solitary kidney.  CT scan does not show any acute findings.  Does not show the changes that were seen last month my suspicion for bowel ischemia.  Discussed aggressive bowel regimen.  Patient and daughter both voiced understanding and are in agreement with plan.  Discussed close follow-up with PCP.   Hyperkalemic today at 5.3.  No intervention necessary.  No EKG changes.  Discussed close follow-up with PCP within the next 3 days to have this repeated.  Also mild renal insufficiency.  Likely due to dehydration.  Discussed increasing hydration.  They voiced understanding and are in agreement with plan.  Plan discussed with attending who is in agreement.  EKG does demonstrate second-degree type I block which is consistent with previous EKGs.  Lab Tests: -I ordered, reviewed, and interpreted labs.   The pertinent results include:   Labs Reviewed  LIPASE, BLOOD - Abnormal; Notable for the following components:      Result Value   Lipase 56 (*)    All other components within normal limits  COMPREHENSIVE METABOLIC PANEL - Abnormal; Notable for the following components:   Potassium 5.3 (*)    Glucose, Bld 119 (*)    BUN 24 (*)    Creatinine, Ser 1.47 (*)    GFR, Estimated 36 (*)    All other components within normal limits  CBC - Abnormal; Notable for the following components:   RBC 3.76 (*)    Hemoglobin 11.8 (*)    HCT 35.9 (*)    All other components within normal limits  URINALYSIS, ROUTINE W REFLEX MICROSCOPIC - Abnormal; Notable for the following components:   Color, Urine COLORLESS (*)    Hgb urine dipstick MODERATE (*)    Leukocytes,Ua TRACE (*)    All other components within normal limits     Imaging Studies ordered: I ordered imaging studies including CT abdomen pelvis without contrast I independently visualized and interpreted imaging. I agree with the radiologist interpretation   Medicines ordered and prescription drug management: No orders of the defined types were placed in this encounter.   -I have reviewed the patients home medicines and have made adjustments as needed  Reevaluation: After the interventions noted above, I reevaluated the patient and found that they have :stayed the same  Co morbidities that complicate the patient evaluation  Past Medical  History:  Diagnosis Date   Anxiety    CAD (coronary artery disease)    Multivessel status post CABG June 2021 - LIMA to LAD, SVG to distal RCA, SVG to OM1   Chronic obstructive pulmonary disease, unspecified ([place])    Depression    Essential hypertension    GERD    History of pneumonia    Incarcerated ventral hernia    Osteoarthritis    Type 2 diabetes mellitus ([place])       Dispostion: Patient is appropriate for discharge.  Discharged in stable condition.  Return precaution discussed.  Patient voices understanding and is in agreement with plan.  Final Clinical Impression(s) / ED Diagnoses Final diagnoses:  Constipation, unspecified constipation type  Hyperkalemia    Rx / DC Orders ED Discharge Orders     None         Evlyn Courier, PA-C 11/11/22 1733    Noemi Chapel, MD 11/11/22 2031

## 2022-11-11 NOTE — Discharge Instructions (Addendum)
Your work-up today was reassuring.  Blood work did not show any concerning findings.  CT scan did not show any concerning findings either.  This is improved from the findings they saw about a month ago.  Recommend you continue taking MiraLAX, continue taking a stool softener such as Senokot and Dulcolax.  Continue using milk of magnesia as needed.  I do recommend adding suppository to your regimen.  Continue hydrating well.  Your kidney function was slightly elevated compared to your baseline.  Follow-up with your primary care provider to have this repeated to ensure this improves.  This could be secondary to dehydration.  For any concerning symptoms such as worsening pain, uncontrolled nausea and vomiting please return to the emergency room.  Your potassium was slightly elevated at 5.3. this will need to be repeated at your PCP office within 72 hours to ensure this is not worsening. It has been this elevated in the past but not recently.

## 2022-11-17 ENCOUNTER — Telehealth: Payer: Self-pay

## 2022-11-17 NOTE — Telephone Encounter (Signed)
        Patient  visited [facility] on 11/28     Telephone encounter attempt :  1st  A HIPAA compliant voice message was left requesting a return call.  Instructed patient to call back     Chijioke Lasser Pop [facility] Care Guide, [place], Care Management  [phone] [address], [place], [place] [address] Phone: [phone] Email: [email or web address]       

## 2022-11-18 ENCOUNTER — Telehealth: Payer: Self-pay

## 2022-11-18 NOTE — Telephone Encounter (Signed)
        Patient  visited Onalee Hua on 11/28     Telephone encounter attempt :  2nd  A HIPAA compliant voice message was left requesting a return call.  Instructed patient to   [facility] Guide, [facility], Care Management  [phone] [address] [place], [place], [place] [address] Phone: [phone] Email: [email or web address]

## 2022-11-25 DIAGNOSIS — E1140 Type 2 diabetes mellitus with diabetic neuropathy, unspecified: Secondary | ICD-10-CM | POA: Diagnosis not present

## 2022-11-25 DIAGNOSIS — E1151 Type 2 diabetes mellitus with diabetic peripheral angiopathy without gangrene: Secondary | ICD-10-CM | POA: Diagnosis not present

## 2022-12-01 ENCOUNTER — Telehealth: Payer: Self-pay | Admitting: Internal Medicine

## 2022-12-01 ENCOUNTER — Other Ambulatory Visit: Payer: Self-pay

## 2022-12-01 DIAGNOSIS — K5900 Constipation, unspecified: Secondary | ICD-10-CM

## 2022-12-01 MED ORDER — BISACODYL 5 MG PO TBEC: 5.0000 mg | DELAYED_RELEASE_TABLET | Freq: Every day | ORAL | 0 refills | Status: DC | PRN

## 2022-12-01 MED ORDER — POLYETHYLENE GLYCOL 3350 17 G PO PACK: 17.0000 g | PACK | Freq: Every day | ORAL | 3 refills | Status: AC | PRN

## 2022-12-01 NOTE — Telephone Encounter (Signed)
Patient daughter called asked if provider can send in a prescription that mixes in water miralax powder send to pharmacy too expensive over the counter.  Needs stool softener also  Pharmacy: [facility]

## 2022-12-01 NOTE — Telephone Encounter (Signed)
Meds sent in

## 2022-12-02 NOTE — Telephone Encounter (Signed)
Meds sent in. Medicaid will not cover stool softners. Miralax is ready for pickup.

## 2022-12-02 NOTE — Telephone Encounter (Signed)
Patient calling back laynes not received yet

## 2023-01-08 ENCOUNTER — Other Ambulatory Visit: Payer: Self-pay | Admitting: *Deleted

## 2023-01-08 ENCOUNTER — Telehealth: Payer: Self-pay | Admitting: *Deleted

## 2023-01-08 DIAGNOSIS — E1121 Type 2 diabetes mellitus with diabetic nephropathy: Secondary | ICD-10-CM

## 2023-01-08 MED ORDER — BLOOD GLUCOSE METER KIT: PACK | 0 refills | Status: AC

## 2023-01-08 NOTE — Telephone Encounter (Signed)
[name] [name] says that she missed a call from our office She was on the phone with [facility]/ Were you all calling her to make the new appointment?

## 2023-01-09 ENCOUNTER — Ambulatory Visit: Payer: Medicare Other | Admitting: Nurse Practitioner

## 2023-01-09 DIAGNOSIS — E782 Mixed hyperlipidemia: Secondary | ICD-10-CM

## 2023-01-09 DIAGNOSIS — E1121 Type 2 diabetes mellitus with diabetic nephropathy: Secondary | ICD-10-CM

## 2023-01-09 DIAGNOSIS — Z905 Acquired absence of kidney: Secondary | ICD-10-CM

## 2023-01-09 DIAGNOSIS — I10 Essential (primary) hypertension: Secondary | ICD-10-CM

## 2023-01-09 DIAGNOSIS — E559 Vitamin D deficiency, unspecified: Secondary | ICD-10-CM

## 2023-01-19 ENCOUNTER — Ambulatory Visit (INDEPENDENT_AMBULATORY_CARE_PROVIDER_SITE_OTHER): Payer: Medicare Other

## 2023-01-19 DIAGNOSIS — Z0000 Encounter for general adult medical examination without abnormal findings: Secondary | ICD-10-CM

## 2023-01-19 NOTE — Progress Notes (Signed)
Subjective:   [name] [name] is a 82 y.o. female who presents for Medicare Annual (Subsequent) preventive examination.  Review of Systems    I connected with  NAZANIN KINNER on 01/19/23 by a audio enabled telemedicine application and verified that I am speaking with the correct person using two identifiers.  Patient Location: Home  Provider Location: Office/Clinic  I discussed the limitations of evaluation and management by telemedicine. The patient expressed understanding and agreed to proceed.        Objective:    There were no vitals filed for this visit. There is no height or weight on file to calculate BMI.     11/11/2022   11:51 AM 09/25/2022    3:15 PM 05/21/2022   12:24 PM 01/03/2022    1:26 PM 06/30/2021   12:25 PM 01/01/2021    9:35 AM 12/27/2020    2:44 PM  Advanced Directives  Does Patient Have a Medical Advance Directive? No No No No No No No  Does patient want to make changes to medical advance directive?      No - Patient declined No - Patient declined  Would patient like information on creating a medical advance directive?  Yes (ED - Information included in AVS) No - Patient declined No - Patient declined No - Patient declined No - Patient declined No - Patient declined    Current Medications (verified) Outpatient Encounter Medications as of 01/19/2023  Medication Sig   Accu-Chek Softclix Lancets lancets Use as instructed   aspirin EC 81 MG tablet Take 1 tablet (81 mg total) by mouth daily. Swallow whole.   atorvastatin (LIPITOR) 40 MG tablet TAKE 1 TABLET BY MOUTH AT BEDTIME.   azithromycin (ZITHROMAX Z-PAK) 250 MG tablet Take 2 tablets (500 mg) PO today, then 1 tablet (250 mg) PO daily x4 days.   Biotin w/ Vitamins C & E (HAIR SKIN & NAILS GUMMIES PO) Take 2 each by mouth every morning.   bisacodyl (DULCOLAX) 5 MG EC tablet Take 1 tablet (5 mg total) by mouth daily as needed for moderate constipation.   blood glucose meter kit and supplies Dispense based on  patient and insurance preference. Use up to four times daily as directed. (FOR ICD-10 E10.9, E11.9).   cetirizine (ZYRTEC) 10 MG tablet Take 1 tablet (10 mg total) by mouth daily as needed (allergies).   fish oil-omega-3 fatty acids 1000 MG capsule Take 1 g by mouth daily.   glucose blood (ACCU-CHEK GUIDE) test strip Use as instructed to monitor glucose twice daily   glucose blood test strip Use as instructed to monitor glucose twice daily   guaiFENesin-dextromethorphan (ROBITUSSIN DM) 100-10 MG/5ML syrup Take 5 mLs by mouth every 4 (four) hours as needed for cough.   linagliptin (TRADJENTA) 5 MG TABS tablet Take 1 tablet (5 mg total) by mouth daily.   metFORMIN (GLUCOPHAGE) 500 MG tablet Take 1 tablet (500 mg total) by mouth daily with breakfast.   Multiple Vitamin (MULITIVITAMIN WITH MINERALS) TABS Take 1 tablet by mouth daily.   nitroGLYCERIN (NITROSTAT) 0.4 MG SL tablet Place 1 tablet (0.4 mg total) under the tongue every 5 (five) minutes x 3 doses as needed for chest pain (if no relief after 3rd dose, proceed to the ED or call 911).   polyethylene glycol (MIRALAX / GLYCOLAX) 17 g packet Take 17 g by mouth daily as needed for mild constipation or moderate constipation.   prednisoLONE acetate (PRED FORTE) 1 % ophthalmic suspension    [  DISCONTINUED] metoprolol tartrate (LOPRESSOR) 25 MG tablet Take 0.5 tablets (12.5 mg total) by mouth 2 (two) times daily.   No facility-administered encounter medications on file as of 01/19/2023.    Allergies (verified) Iodinated contrast media, Codeine, Morphine and related, Other, and Penicillins   History: Past Medical History:  Diagnosis Date   Anxiety    CAD (coronary artery disease)    Multivessel status post CABG June 2021 - LIMA to LAD, SVG to distal RCA, SVG to OM1   Chronic obstructive pulmonary disease, unspecified ([place])    Depression    Essential hypertension    GERD    History of pneumonia    Incarcerated ventral hernia    Osteoarthritis     Type 2 diabetes mellitus ([place])    Past Surgical History:  Procedure Laterality Date   CATARACT EXTRACTION Bilateral 2023   CORONARY ARTERY BYPASS GRAFT N/A 05/22/2020   Procedure: CORONARY ARTERY BYPASS GRAFTING (CABG) x3 LIMA TO LAD, SVG TO OM1, SVG TO DISTAL RIGHT;  Surgeon: Lajuana Matte, MD;  Location: [facility];  Service: Open Heart Surgery;  Laterality: N/A;   LEFT HEART CATH AND CORONARY ANGIOGRAPHY N/A 05/21/2020   Procedure: LEFT HEART CATH AND CORONARY ANGIOGRAPHY;  Surgeon: Martinique, Peter M, MD;  Location: [facility];  Service: Cardiovascular;  Laterality: N/A;   NEPHRECTOMY     TEE WITHOUT CARDIOVERSION N/A 05/22/2020   Procedure: TRANSESOPHAGEAL ECHOCARDIOGRAM (TEE);  Surgeon: Lajuana Matte, MD;  Location: [facility];  Service: Open Heart Surgery;  Laterality: N/A;   VEIN HARVEST Right 05/22/2020   Procedure: Open Vein Harvest;  Surgeon: Lajuana Matte, MD;  Location: [facility];  Service: Open Heart Surgery;  Laterality: Right;   VENTRAL HERNIA REPAIR  09/01/2017   mistaken entry   Family History  Problem Relation Age of Onset   CAD Father    Heart failure Sister    CAD Daughter    Heart disease Daughter    Social History   Socioeconomic History   Marital status: Widowed    Spouse name: Not on file   Number of children: 1   Years of education: 71   Highest education level: 12th grade  Occupational History   Occupation: Retired  Tobacco Use   Smoking status: Never   Smokeless tobacco: Never  Vaping Use   Vaping Use: Never used  Substance and Sexual Activity   Alcohol use: No   Drug use: No   Sexual activity: Not Currently  Other Topics Concern   Not on file  Social History Narrative   Lives alone.  Has 1 daughter      Enjoys reading and cooking      Eats all food groups   Caffeine, some coffee   Water reports drinking pretty well throughout the day      Social Determinants of Health   Financial Resource Strain: Low Risk   (01/03/2022)   Overall Financial Resource Strain (CARDIA)    Difficulty of Paying Living Expenses: Not very hard  Food Insecurity: No Food Insecurity (01/03/2022)   Hunger Vital Sign    Worried About Running Out of Food in the Last Year: Never true    Ran Out of Food in the Last Year: Never true  Transportation Needs: No Transportation Needs (01/03/2022)   PRAPARE - Hydrologist (Medical): No    Lack of Transportation (Non-Medical): No  Physical Activity: Insufficiently Active (01/03/2022)   Exercise Vital Sign  Days of Exercise per Week: 3 days    Minutes of Exercise per Session: 20 min  Stress: No Stress Concern Present (01/03/2022)   [place]    Feeling of Stress : Only a little  Social Connections: Moderately Integrated (01/03/2022)   Social Connection and Isolation Panel [NHANES]    Frequency of Communication with Friends and Family: More than three times a week    Frequency of Social Gatherings with Friends and Family: More than three times a week    Attends Religious Services: More than 4 times per year    Active Member of [employer] or Organizations: Yes    Attends Archivist Meetings: Never    Marital Status: Widowed    Tobacco Counseling Counseling given: Not Answered   Clinical Intake:    Ms. Blazina , Thank you for taking time to come for your Medicare Wellness Visit. I appreciate your ongoing commitment to your health goals. Please review the following plan we discussed and let me know if I can assist you in the future.   These are the goals we discussed:  Goals      Activity and Exercise Increased     Evidence-based guidance:  Review current exercise levels.  Assess patient perspective on exercise or activity level, barriers to increasing activity, motivation and readiness for change.  Recommend or set healthy exercise goal based on individual tolerance.   Encourage small steps toward making change in amount of exercise or activity.  Urge reduction of sedentary activities or screen time.  Promote group activities within the community or with family or support person.  Consider referral to rehabiliation therapist for assessment and exercise/activity plan.   Notes:      Monitor and Manage My Blood Sugar-Diabetes Type 2     Timeframe:  Long-Range Goal Priority:  High Start Date: 12/27/20                            Expected End Date:  06/13/21                     Follow Up Date 04/13/21    - check blood sugar at prescribed times - enter blood sugar readings and medication or insulin into daily log - take the blood sugar meter to all doctor visits    Why is this important?   Checking your blood sugar at home helps to keep it from getting very high or very low.  Writing the results in a diary or log helps the doctor know how to care for you.  Your blood sugar log should have the time, date and the results.  Also, write down the amount of insulin or other medicine that you take.  Other information, like what you ate, exercise done and how you were feeling, will also be helpful.     Notes: Patient reports that she takes her blood sugar daily and records the values. Patient acknowledges that her A1c has elevated to 11.5. Patient explains that she has started the new medication Metformin to help lower her A1c and she will work on changing her diabetic diet by doing what she knows works best for her. Nurse will send patient diabetic education.     Obtain Eye Exam-Diabetes Type 2     Timeframe:  Long-Range Goal Priority:  High Start Date: 12/27/20  Expected End Date: 04/13/21                    Follow Up Date 04/13/21   - schedule appointment with eye doctor    Why is this important?   Eye check-ups are important when you have diabetes.  Vision loss can be prevented.    Notes: Patient stated that it has 3-4 years  since she had an eye exam. Nurse provided education regarding eye damage risk with having diabetes and encouraged patient to make an appointment. Patient stated that she does need a new pair of eyeglasses and she will make an eye exam appointment.      Patient Stated     Do more things like she used to.        This is a list of the screening recommended for you and due dates:  Health Maintenance  Topic Date Due   DEXA scan (bone density measurement)  Never done   DTaP/Tdap/Td vaccine (1 - Tdap) Never done   Eye exam for diabetics  11/21/2022   Zoster (Shingles) Vaccine (1 of 2) 01/27/2023*   Flu Shot  03/15/2023*   Hemoglobin A1C  03/10/2023   Yearly kidney health urinalysis for diabetes  04/12/2023   Complete foot exam   04/12/2023   Yearly kidney function blood test for diabetes  11/12/2023   Medicare Annual Wellness Visit  01/20/2024   Pneumonia Vaccine  Completed   HPV Vaccine  Aged Out   COVID-19 Vaccine  Discontinued  *Topic was postponed. The date shown is not the original due date.                 Diabetic?Yes Nutrition Risk Assessment:  Has the patient had any N/V/D within the last 2 months?  No  Does the patient have any non-healing wounds?  No  Has the patient had any unintentional weight loss or weight gain?  No   Diabetes:  Is the patient diabetic?  Yes  If diabetic, was a CBG obtained today?  No  Did the patient bring in their glucometer from home?  No  How often do you monitor your CBG's? daily.   Financial Strains and Diabetes Management:  Are you having any financial strains with the device, your supplies or your medication? No .  Does the patient want to be seen by Chronic Care Management for management of their diabetes?  No  Would the patient like to be referred to a Nutritionist or for Diabetic Management?  No   Diabetic Exams:  Diabetic Eye Exam: Overdue for diabetic eye exam. Pt has been advised about the importance in completing this  exam. Patient advised to call and schedule an eye exam. Diabetic Foot Exam: Overdue, Pt has been advised about the importance in completing this exam. Pt is scheduled for diabetic foot exam on  .          Activities of Daily Living     No data to display           Patient Care Team: Billie Lade, MD as PCP - General (Internal Medicine) Jonelle Sidle, MD as PCP - Cardiology (Cardiology) Netta Neat., NP (Inactive) as Nurse Practitioner (Cardiology)  Indicate any recent Medical Services you may have received from other than [facility] providers in the past year (date may be approximate).     Assessment:   This is a routine wellness examination for Cornesha.  Hearing/Vision screen No results found.  Dietary issues and exercise activities discussed:     Goals Addressed   None   Depression Screen    10/31/2022   10:32 AM 10/27/2022    1:16 PM 08/21/2022    2:10 PM 04/11/2022    1:30 PM 01/03/2022    1:22 PM 11/27/2021    9:58 AM 08/21/2021    1:50 PM  PHQ 2/9 Scores  PHQ - 2 Score 0 0 0 0 1 0 0    Fall Risk    10/31/2022   10:31 AM 10/27/2022    1:16 PM 08/21/2022    2:10 PM 04/11/2022    1:30 PM 01/03/2022    1:27 PM  Fall Risk   Falls in the past year? 0 0 0 0 0  Number falls in past yr: 0 0 0 0 0  Injury with Fall? 0 0 0 0 0  Risk for fall due to : No Fall Risks No Fall Risks No Fall Risks No Fall Risks No Fall Risks  Follow up Falls evaluation completed Falls evaluation completed Falls evaluation completed Falls evaluation completed Falls evaluation completed    FALL RISK PREVENTION PERTAINING TO THE HOME:  Any stairs in or around the home? Yes  If so, are there any without handrails? No  Home free of loose throw rugs in walkways, pet beds, electrical cords, etc? Yes  Adequate lighting in your home to reduce risk of falls? Yes   ASSISTIVE DEVICES UTILIZED TO PREVENT FALLS:  Life alert? No  Use of a cane, walker or w/c? No  Grab bars in the  bathroom? No  Shower chair or bench in shower? No  Elevated toilet seat or a handicapped toilet? No         01/03/2022    1:31 PM 01/01/2021    9:40 AM  6CIT Screen  What Year? 0 points 0 points  What month? 0 points 0 points  What time? 0 points 0 points  Count back from 20 0 points 0 points  Months in reverse 0 points 0 points  Repeat phrase 0 points 0 points  Total Score 0 points 0 points    Immunizations Immunization History  Administered Date(s) Administered   Influenza, High Dose Seasonal PF 11/12/2017, 08/13/2018   Influenza-Unspecified 11/12/2011, 11/03/2012, 11/02/2013, 09/20/2014, 09/19/2015, 09/23/2016, 08/18/2019   Pneumococcal Conjugate-13 09/20/2014   Pneumococcal Polysaccharide-23 03/18/2016    TDAP status: Due, Education has been provided regarding the importance of this vaccine. Advised may receive this vaccine at local pharmacy or Health Dept. Aware to provide a copy of the vaccination record if obtained from local pharmacy or Health Dept. Verbalized acceptance and understanding.  Flu Vaccine status: Due, Education has been provided regarding the importance of this vaccine. Advised may receive this vaccine at local pharmacy or Health Dept. Aware to provide a copy of the vaccination record if obtained from local pharmacy or Health Dept. Verbalized acceptance and understanding.  Pneumococcal vaccine status: Due, Education has been provided regarding the importance of this vaccine. Advised may receive this vaccine at local pharmacy or Health Dept. Aware to provide a copy of the vaccination record if obtained from local pharmacy or Health Dept. Verbalized acceptance and understanding.  Covid-19 vaccine status: Information provided on how to obtain vaccines.   Qualifies for Shingles Vaccine? Yes   Zostavax completed No   Shingrix Completed?: No.    Education has been provided regarding the importance of this vaccine. Patient has been advised to call insurance company  to determine  out of pocket expense if they have not yet received this vaccine. Advised may also receive vaccine at local pharmacy or Health Dept. Verbalized acceptance and understanding.  Screening Tests Health Maintenance  Topic Date Due   DEXA SCAN  Never done   DTaP/Tdap/Td (1 - Tdap) Never done   OPHTHALMOLOGY EXAM  11/21/2022   Zoster Vaccines- Shingrix (1 of 2) 01/27/2023 (Originally 09/29/1960)   INFLUENZA VACCINE  03/15/2023 (Originally 07/15/2022)   HEMOGLOBIN A1C  03/10/2023   Diabetic kidney evaluation - Urine ACR  04/12/2023   FOOT EXAM  04/12/2023   Diabetic kidney evaluation - eGFR measurement  11/12/2023   Medicare Annual Wellness (AWV)  01/20/2024   Pneumonia Vaccine 34+ Years old  Completed   HPV VACCINES  Aged Out   COVID-19 Vaccine  Discontinued    Health Maintenance  Health Maintenance Due  Topic Date Due   DEXA SCAN  Never done   DTaP/Tdap/Td (1 - Tdap) Never done   OPHTHALMOLOGY EXAM  11/21/2022    Colorectal cancer screening: No longer required.   Mammogram status: No longer required due to age.  Bone Density status: Ordered  . Pt provided with contact info and advised to call to schedule appt.  Lung Cancer Screening: (Low Dose CT Chest recommended if Age 73-80 years, 30 pack-year currently smoking OR have quit w/in 15years.) does not qualify.   Lung Cancer Screening Referral: no  Additional Screening:  Hepatitis C Screening: does qualify; Completed   Vision Screening: Recommended annual ophthalmology exams for early detection of glaucoma and other disorders of the eye. Is the patient up to date with their annual eye exam?  Yes  Who is the provider or what is the name of the office in which the patient attends annual eye exams? My eye doctor Jonita Albee If pt is not established with a provider, would they like to be referred to a provider to establish care? No .   Dental Screening: Recommended annual dental exams for proper oral hygiene  Community  Resource Referral / Chronic Care Management: CRR required this visit?  No   CCM required this visit?  No      Plan:     I have personally reviewed and noted the following in the patient's chart:   Medical and social history Use of alcohol, tobacco or illicit drugs  Current medications and supplements including opioid prescriptions. Patient is not currently taking opioid prescriptions. Functional ability and status Nutritional status Physical activity Advanced directives List of other physicians Hospitalizations, surgeries, and ER visits in previous 12 months Vitals Screenings to include cognitive, depression, and falls Referrals and appointments  In addition, I have reviewed and discussed with patient certain preventive protocols, quality metrics, and best practice recommendations. A written personalized care plan for preventive services as well as general preventive health recommendations were provided to patient.     Jasper Riling, CMA   01/19/2023

## 2023-01-19 NOTE — Patient Instructions (Signed)
Ms. Herandez , Thank you for taking time to come for your Medicare Wellness Visit. I appreciate your ongoing commitment to your health goals. Please review the following plan we discussed and let me know if I can assist you in the future.   These are the goals we discussed:  Goals      Activity and Exercise Increased     Evidence-based guidance:  Review current exercise levels.  Assess patient perspective on exercise or activity level, barriers to increasing activity, motivation and readiness for change.  Recommend or set healthy exercise goal based on individual tolerance.  Encourage small steps toward making change in amount of exercise or activity.  Urge reduction of sedentary activities or screen time.  Promote group activities within the community or with family or support person.  Consider referral to rehabiliation therapist for assessment and exercise/activity plan.   Notes:      Monitor and Manage My Blood Sugar-Diabetes Type 2     Timeframe:  Long-Range Goal Priority:  High Start Date: 12/27/20                            Expected End Date:  06/13/21                     Follow Up Date 04/13/21    - check blood sugar at prescribed times - enter blood sugar readings and medication or insulin into daily log - take the blood sugar meter to all doctor visits    Why is this important?   Checking your blood sugar at home helps to keep it from getting very high or very low.  Writing the results in a diary or log helps the doctor know how to care for you.  Your blood sugar log should have the time, date and the results.  Also, write down the amount of insulin or other medicine that you take.  Other information, like what you ate, exercise done and how you were feeling, will also be helpful.     Notes: Patient reports that she takes her blood sugar daily and records the values. Patient acknowledges that her A1c has elevated to 11.5. Patient explains that she has started the new  medication Metformin to help lower her A1c and she will work on changing her diabetic diet by doing what she knows works best for her. Nurse will send patient diabetic education.     Obtain Eye Exam-Diabetes Type 2     Timeframe:  Long-Range Goal Priority:  High Start Date: 12/27/20                            Expected End Date: 04/13/21                    Follow Up Date 04/13/21   - schedule appointment with eye doctor    Why is this important?   Eye check-ups are important when you have diabetes.  Vision loss can be prevented.    Notes: Patient stated that it has 3-4 years since she had an eye exam. Nurse provided education regarding eye damage risk with having diabetes and encouraged patient to make an appointment. Patient stated that she does need a new pair of eyeglasses and she will make an eye exam appointment.      Patient Stated     Do more things like she used  to.        This is a list of the screening recommended for you and due dates:  Health Maintenance  Topic Date Due   DEXA scan (bone density measurement)  Never done   DTaP/Tdap/Td vaccine (1 - Tdap) Never done   Eye exam for diabetics  11/21/2022   Zoster (Shingles) Vaccine (1 of 2) 01/27/2023*   Flu Shot  03/15/2023*   Hemoglobin A1C  03/10/2023   Yearly kidney health urinalysis for diabetes  04/12/2023   Complete foot exam   04/12/2023   Yearly kidney function blood test for diabetes  11/12/2023   Medicare Annual Wellness Visit  01/20/2024   Pneumonia Vaccine  Completed   HPV Vaccine  Aged Out   COVID-19 Vaccine  Discontinued  *Topic was postponed. The date shown is not the original due date.

## 2023-02-03 ENCOUNTER — Encounter: Payer: Self-pay | Admitting: Nurse Practitioner

## 2023-02-03 ENCOUNTER — Other Ambulatory Visit: Payer: Self-pay | Admitting: Nurse Practitioner

## 2023-02-03 ENCOUNTER — Ambulatory Visit (INDEPENDENT_AMBULATORY_CARE_PROVIDER_SITE_OTHER): Payer: Medicare Other | Admitting: Nurse Practitioner

## 2023-02-03 VITALS — BP 117/73 | HR 76 | Ht 59.0 in | Wt 149.2 lb

## 2023-02-03 DIAGNOSIS — I10 Essential (primary) hypertension: Secondary | ICD-10-CM

## 2023-02-03 DIAGNOSIS — E559 Vitamin D deficiency, unspecified: Secondary | ICD-10-CM | POA: Diagnosis not present

## 2023-02-03 DIAGNOSIS — E1121 Type 2 diabetes mellitus with diabetic nephropathy: Secondary | ICD-10-CM

## 2023-02-03 DIAGNOSIS — Z905 Acquired absence of kidney: Secondary | ICD-10-CM

## 2023-02-03 DIAGNOSIS — E782 Mixed hyperlipidemia: Secondary | ICD-10-CM | POA: Diagnosis not present

## 2023-02-03 LAB — POCT GLYCOSYLATED HEMOGLOBIN (HGB A1C): Hemoglobin A1C: 7.1 % — AB (ref 4.0–5.6)

## 2023-02-03 MED ORDER — LINAGLIPTIN 5 MG PO TABS: 5.0000 mg | ORAL_TABLET | Freq: Every day | ORAL | 3 refills | Status: DC

## 2023-02-03 MED ORDER — METFORMIN HCL 500 MG PO TABS: 500.0000 mg | ORAL_TABLET | Freq: Every day | ORAL | 3 refills | Status: DC

## 2023-02-03 NOTE — Progress Notes (Signed)
Endocrinology Follow Up Note       02/03/2023, 3:36 PM   Subjective:    Patient ID: [name] [name], female    DOB: [birth year].  [name] [name] is being seen in follow up after being seen in consultation for management of currently uncontrolled symptomatic diabetes requested by  Johnette Abraham, MD.   Past Medical History:  Diagnosis Date   Anxiety    CAD (coronary artery disease)    Multivessel status post CABG June 2021 - LIMA to LAD, SVG to distal RCA, SVG to OM1   Chronic obstructive pulmonary disease, unspecified ([place])    Depression    Essential hypertension    GERD    History of pneumonia    Incarcerated ventral hernia    Osteoarthritis    Type 2 diabetes mellitus ([place])     Past Surgical History:  Procedure Laterality Date   CATARACT EXTRACTION Bilateral 2023   CORONARY ARTERY BYPASS GRAFT N/A 05/22/2020   Procedure: CORONARY ARTERY BYPASS GRAFTING (CABG) x3 LIMA TO LAD, SVG TO OM1, SVG TO DISTAL RIGHT;  Surgeon: Lajuana Matte, MD;  Location: [facility];  Service: Open Heart Surgery;  Laterality: N/A;   LEFT HEART CATH AND CORONARY ANGIOGRAPHY N/A 05/21/2020   Procedure: LEFT HEART CATH AND CORONARY ANGIOGRAPHY;  Surgeon: Martinique, Peter M, MD;  Location: [facility];  Service: Cardiovascular;  Laterality: N/A;   NEPHRECTOMY     TEE WITHOUT CARDIOVERSION N/A 05/22/2020   Procedure: TRANSESOPHAGEAL ECHOCARDIOGRAM (TEE);  Surgeon: Lajuana Matte, MD;  Location: [facility];  Service: Open Heart Surgery;  Laterality: N/A;   VEIN HARVEST Right 05/22/2020   Procedure: Open Vein Harvest;  Surgeon: Lajuana Matte, MD;  Location: [facility];  Service: Open Heart Surgery;  Laterality: Right;   VENTRAL HERNIA REPAIR  09/01/2017   mistaken entry    Social History   Socioeconomic History   Marital status: Widowed    Spouse name: Not on file   Number of children: 1   Years of education: 29    Highest education level: 12th grade  Occupational History   Occupation: Retired  Tobacco Use   Smoking status: Never   Smokeless tobacco: Never  Vaping Use   Vaping Use: Never used  Substance and Sexual Activity   Alcohol use: No   Drug use: No   Sexual activity: Not Currently  Other Topics Concern   Not on file  Social History Narrative   Lives alone.  Has 1 daughter      Enjoys reading and cooking      Eats all food groups   Caffeine, some coffee   Water reports drinking pretty well throughout the day      Social Determinants of Health   Financial Resource Strain: Low Risk  (01/19/2023)   Overall Financial Resource Strain (CARDIA)    Difficulty of Paying Living Expenses: Not hard at all  Food Insecurity: No Food Insecurity (01/19/2023)   Hunger Vital Sign    Worried About Running Out of Food in the Last Year: Never true    Ran Out of Food in the Last Year: Never true  Transportation Needs: No Transportation Needs (  01/19/2023)   PRAPARE - Hydrologist (Medical): No    Lack of Transportation (Non-Medical): No  Physical Activity: Insufficiently Active (01/19/2023)   Exercise Vital Sign    Days of Exercise per Week: 5 days    Minutes of Exercise per Session: 20 min  Stress: No Stress Concern Present (01/19/2023)   [place]    Feeling of Stress : Not at all  Social Connections: Moderately Isolated (01/19/2023)   Social Connection and Isolation Panel [NHANES]    Frequency of Communication with Friends and Family: More than three times a week    Frequency of Social Gatherings with Friends and Family: More than three times a week    Attends Religious Services: More than 4 times per year    Active Member of [employer] or Organizations: No    Attends Archivist Meetings: Never    Marital Status: Widowed    Family History  Problem Relation Age of Onset   CAD Father    Heart failure  Sister    CAD Daughter    Heart disease Daughter     Outpatient Encounter Medications as of 02/03/2023  Medication Sig   Accu-Chek Softclix Lancets lancets Use as instructed   aspirin EC 81 MG tablet Take 1 tablet (81 mg total) by mouth daily. Swallow whole.   atorvastatin (LIPITOR) 40 MG tablet TAKE 1 TABLET BY MOUTH AT BEDTIME.   azithromycin (ZITHROMAX Z-PAK) 250 MG tablet Take 2 tablets (500 mg) PO today, then 1 tablet (250 mg) PO daily x4 days.   Biotin w/ Vitamins C & E (HAIR SKIN & NAILS GUMMIES PO) Take 2 each by mouth every morning.   bisacodyl (DULCOLAX) 5 MG EC tablet Take 1 tablet (5 mg total) by mouth daily as needed for moderate constipation.   blood glucose meter kit and supplies Dispense based on patient and insurance preference. Use up to four times daily as directed. (FOR ICD-10 E10.9, E11.9).   cetirizine (ZYRTEC) 10 MG tablet Take 1 tablet (10 mg total) by mouth daily as needed (allergies).   fish oil-omega-3 fatty acids 1000 MG capsule Take 1 g by mouth daily.   glucose blood (ACCU-CHEK GUIDE) test strip Use as instructed to monitor glucose twice daily   glucose blood test strip Use as instructed to monitor glucose twice daily   guaiFENesin-dextromethorphan (ROBITUSSIN DM) 100-10 MG/5ML syrup Take 5 mLs by mouth every 4 (four) hours as needed for cough.   Multiple Vitamin (MULITIVITAMIN WITH MINERALS) TABS Take 1 tablet by mouth daily.   polyethylene glycol (MIRALAX / GLYCOLAX) 17 g packet Take 17 g by mouth daily as needed for mild constipation or moderate constipation.   prednisoLONE acetate (PRED FORTE) 1 % ophthalmic suspension    [DISCONTINUED] linagliptin (TRADJENTA) 5 MG TABS tablet Take 1 tablet (5 mg total) by mouth daily.   [DISCONTINUED] metFORMIN (GLUCOPHAGE) 500 MG tablet Take 1 tablet (500 mg total) by mouth daily with breakfast.   linagliptin (TRADJENTA) 5 MG TABS tablet Take 1 tablet (5 mg total) by mouth daily.   metFORMIN (GLUCOPHAGE) 500 MG tablet Take  1 tablet (500 mg total) by mouth daily with breakfast.   nitroGLYCERIN (NITROSTAT) 0.4 MG SL tablet Place 1 tablet (0.4 mg total) under the tongue every 5 (five) minutes x 3 doses as needed for chest pain (if no relief after 3rd dose, proceed to the ED or call 911).   [DISCONTINUED] metoprolol tartrate (  LOPRESSOR) 25 MG tablet Take 0.5 tablets (12.5 mg total) by mouth 2 (two) times daily.   No facility-administered encounter medications on file as of 02/03/2023.    ALLERGIES: Allergies  Allergen Reactions   Iodinated Contrast Media Other (See Comments)    Unknown reaction. Due to kidney issues. Do not use Unknown reaction. Due to kidney issues. Do not use   Codeine Rash   Morphine And Related Other (See Comments) and Rash    Unknown Reaction Unknown Reaction   Other     Pt has multiple allergies per pt but unsure of all.    Penicillins Other (See Comments)    Unknown reaction Unknown reaction    VACCINATION STATUS: Immunization History  Administered Date(s) Administered   Influenza, High Dose Seasonal PF 11/12/2017, 08/13/2018   Influenza-Unspecified 11/12/2011, 11/03/2012, 11/02/2013, 09/20/2014, 09/19/2015, 09/23/2016, 08/18/2019   Pneumococcal Conjugate-13 09/20/2014   Pneumococcal Polysaccharide-23 03/18/2016    Diabetes She presents for her follow-up diabetic visit. She has type 2 diabetes mellitus. Onset time: Was diagnosed at approx age of 43. Her disease course has been worsening. There are no hypoglycemic associated symptoms. Associated symptoms include blurred vision and foot paresthesias. Pertinent negatives for diabetes include no fatigue, no polydipsia and no polyuria. There are no hypoglycemic complications. Symptoms are stable. Diabetic complications include heart disease, nephropathy and peripheral neuropathy. Risk factors for coronary artery disease include diabetes mellitus, dyslipidemia, hypertension, stress, tobacco exposure, sedentary lifestyle and  post-menopausal. Current diabetic treatment includes oral agent (dual therapy). She is compliant with treatment most of the time. Her weight is increasing steadily. She is following a generally healthy diet. When asked about meal planning, she reported none. She has not had a previous visit with a dietitian. She rarely participates in exercise. Her home blood glucose trend is fluctuating minimally. Her breakfast blood glucose range is generally 140-180 mg/dl. (She presents today, accompanied by her daughter, with her glucose meter and logs showing slightly above target fasting glycemic profile.  Her POCT A1c today is 7.1%, increasing from last visit of 6.4%.  She notes she had COVID since last visit and was also hospitalized for complications with her stomach (ischemic bowel?).  Her glucose ranges between 140-170 mostly.  She denies any hypoglycemia.  She does admit she has been eating more sweets lately.) An ACE inhibitor/angiotensin II receptor blocker is not being taken. She does not see a podiatrist.Eye exam is current.  Hyperlipidemia This is a chronic problem. The current episode started more than 1 year ago. The problem is uncontrolled. Recent lipid tests were reviewed and are variable. Exacerbating diseases include chronic renal disease and diabetes. Factors aggravating her hyperlipidemia include fatty foods and beta blockers. Current antihyperlipidemic treatment includes statins. The current treatment provides mild improvement of lipids. Compliance problems include adherence to diet and adherence to exercise.  Risk factors for coronary artery disease include diabetes mellitus, dyslipidemia, hypertension, post-menopausal, a sedentary lifestyle and stress.  Hypertension This is a chronic problem. The current episode started more than 1 year ago. The problem has been waxing and waning since onset. The problem is controlled. Associated symptoms include blurred vision. There are no associated agents to  hypertension. Risk factors for coronary artery disease include diabetes mellitus, dyslipidemia, sedentary lifestyle, smoking/tobacco exposure, stress and post-menopausal state. Past treatments include beta blockers. The current treatment provides mild improvement. There are no compliance problems.  Hypertensive end-organ damage includes kidney disease and CAD/MI. Identifiable causes of hypertension include chronic renal disease.    Review of systems  Constitutional: + increasing body weight,  current Body mass index is 30.13 kg/m. , no fatigue, no subjective hyperthermia, no subjective hypothermia Eyes: no blurry vision, no xerophthalmia ENT: no sore throat, no nodules palpated in throat, no dysphagia/odynophagia, no hoarseness Cardiovascular: no chest pain, no shortness of breath, no palpitations, no leg swelling Respiratory: no cough, no shortness of breath Gastrointestinal: no nausea/vomiting/diarrhea Musculoskeletal: no muscle/joint aches Skin: no rashes, no hyperemia Neurological: no tremors, no numbness, no tingling, no dizziness Psychiatric: no depression, no anxiety  Objective:     BP 117/73 (BP Location: Left Arm, Patient Position: Sitting, Cuff Size: Large)   Pulse 76   Ht 4' 11"$  (1.499 m)   Wt 149 lb 3.2 oz (67.7 kg)   BMI 30.13 kg/m   Wt Readings from Last 3 Encounters:  02/03/23 149 lb 3.2 oz (67.7 kg)  11/11/22 130 lb (59 kg)  10/08/22 139 lb 9.6 oz (63.3 kg)     BP Readings from Last 3 Encounters:  02/03/23 117/73  11/11/22 (!) 132/51  10/08/22 106/64      Physical Exam- Limited  Constitutional:  Body mass index is 30.13 kg/m. , not in acute distress, anxious state of mind Eyes:  EOMI, no exophthalmos Musculoskeletal: she does have abnormal bulge in LLQ of abdomen (unknown etiology), strength intact in all four extremities, no gross restriction of joint movements Skin:  no rashes, no hyperemia Neurological: no tremor with outstretched hands   CMP ( most  recent) CMP     Component Value Date/Time   NA 138 11/11/2022 1227   NA 141 11/21/2021 1115   K 5.3 (H) 11/11/2022 1227   CL 103 11/11/2022 1227   CO2 27 11/11/2022 1227   GLUCOSE 119 (H) 11/11/2022 1227   BUN 24 (H) 11/11/2022 1227   BUN 27 11/21/2021 1115   CREATININE 1.47 (H) 11/11/2022 1227   CREATININE 1.28 (H) 03/07/2020 1137   CALCIUM 8.9 11/11/2022 1227   PROT 7.3 11/11/2022 1227   PROT 6.8 11/21/2021 1115   ALBUMIN 3.6 11/11/2022 1227   ALBUMIN 4.5 11/21/2021 1115   AST 25 11/11/2022 1227   ALT 29 11/11/2022 1227   ALKPHOS 76 11/11/2022 1227   BILITOT 0.5 11/11/2022 1227   BILITOT 0.3 11/21/2021 1115   GFRNONAA 36 (L) 11/11/2022 1227   GFRNONAA 40 (L) 03/07/2020 1137   GFRAA 48 (L) 12/28/2020 0955   GFRAA 46 (L) 03/07/2020 1137     Diabetic Labs (most recent): Lab Results  Component Value Date   HGBA1C 7.1 (A) 02/03/2023   HGBA1C 5.6 09/09/2022   HGBA1C 6.6 (H) 04/07/2022   MICROALBUR 30 02/27/2021   MICROALBUR 2.4 03/07/2020   MICROALBUR 0.9 08/13/2018     Lipid Panel ( most recent) Lipid Panel     Component Value Date/Time   CHOL 90 04/07/2022 1226   CHOL 121 11/21/2021 1115   TRIG 120 04/07/2022 1226   HDL 36 (L) 04/07/2022 1226   HDL 39 (L) 11/21/2021 1115   CHOLHDL 2.5 04/07/2022 1226   VLDL 24 04/07/2022 1226   LDLCALC 30 04/07/2022 1226   LDLCALC 55 11/21/2021 1115   LDLCALC 109 (H) 03/07/2020 1137   LABVLDL 27 11/21/2021 1115      Lab Results  Component Value Date   TSH 2.469 05/21/2022   TSH 3.040 08/22/2021   TSH 2.02 11/17/2019   FREET4 1.25 08/22/2021           Assessment & Plan:   1) Controlled Type 2 Diabetes with  CKD stage 3  - MOLLYANNE SCHWARZKOPF has currently uncontrolled symptomatic type 2 DM since 82 years of age.  She presents today, accompanied by her daughter, with her glucose meter and logs showing slightly above target fasting glycemic profile.  Her POCT A1c today is 7.1%, increasing from last visit of 6.4%.   She notes she had COVID since last visit and was also hospitalized for complications with her stomach (ischemic bowel?).  Her glucose ranges between 140-170 mostly.  She denies any hypoglycemia.  She does admit she has been eating more sweets lately.  -Recent labs reviewed.  - I had a long discussion with her about the progressive nature of diabetes and the pathology behind its complications. -her diabetes is complicated by CAD and she remains at a high risk for more acute and chronic complications which include CAD, CVA, CKD, retinopathy, and neuropathy. These are all discussed in detail with her.  - Nutritional counseling repeated at each appointment due to patients tendency to fall back in to old habits.  - The patient admits there is a room for improvement in their diet and drink choices. -  Suggestion is made for the patient to avoid simple carbohydrates from their diet including Cakes, Sweet Desserts / Pastries, Ice Cream, Soda (diet and regular), Sweet Tea, Candies, Chips, Cookies, Sweet Pastries, Store Bought Juices, Alcohol in Excess of 1-2 drinks a day, Artificial Sweeteners, Coffee Creamer, and "Sugar-free" Products. This will help patient to have stable blood glucose profile and potentially avoid unintended weight gain.   - I encouraged the patient to switch to unprocessed or minimally processed complex starch and increased protein intake (animal or plant source), fruits, and vegetables.   - Patient is advised to stick to a routine mealtimes to eat 3 meals a day and avoid unnecessary snacks (to snack only to correct hypoglycemia).  - she will be scheduled with Jearld Fenton, RDN, CDE for diabetes education.  - I have approached her with the following individualized plan to manage  her diabetes and patient agrees:   Given her stable, at goal glycemic profile, she can continue her current medication regimen of Metformin 500 mg po once daily with breakfast, Tradjenta 5 mg po daily with  breakfast.    -she is encouraged to continue monitoring blood glucose at least once daily, before breakfast, and to call the clinic if she has readings less than 70 or above 200 for 3 tests in a row.  - Specific targets for  A1c;  LDL, HDL,  and Triglycerides were discussed with the patient.  2) Blood Pressure /Hypertension:  her blood pressure is controlled to target for her age.   she is not currently on any antihypertensive medications at this time.  3) Lipids/Hyperlipidemia:    Review of her recent lipid panel from 04/07/22 showed controlled  LDL at 30.  she  is advised to continue Lipitor 40 mg daily at bedtime and Fish Oil supplement.  Side effects and precautions discussed with her.    4)  Weight/Diet:  her Body mass index is 30.13 kg/m.  -  clearly complicating her diabetes care.   she is a candidate for weight loss. I discussed with her the fact that loss of 5 - 10% of her  current body weight will have the most impact on her diabetes management.  Exercise, and detailed carbohydrates information provided  -  detailed on discharge instructions.  5) Hypervitaminosis of vitamin D Her most recent vitamin D level on 08/22/21 was  high at 113.  She was taking several supplements that contained vitamin D in it.  The nephrologist asked her to stop all vitamin D supplements.  6) Chronic Care/Health Maintenance: -she is on Statin medications and is encouraged to initiate and continue to follow up with Ophthalmology, Dentist, Podiatrist at least yearly or according to recommendations, and advised to stay away from smoking. I have recommended yearly flu vaccine and pneumonia vaccine at least every 5 years; moderate intensity exercise for up to 150 minutes weekly; and sleep for at least 7 hours a day.  - she is advised to maintain close follow up with Doren Custard Hazle Nordmann, MD for primary care needs, as well as her other providers for optimal and coordinated care.     I spent  48  minutes in the care  of the patient today including review of labs from [facility], Lipids, Thyroid Function, Hematology (current and previous including abstractions from other facilities); face-to-face time discussing  her blood glucose readings/logs, discussing hypoglycemia and hyperglycemia episodes and symptoms, medications doses, her options of short and long term treatment based on the latest standards of care / guidelines;  discussion about incorporating lifestyle medicine;  and documenting the encounter. Risk reduction counseling performed per USPSTF guidelines to reduce obesity and cardiovascular risk factors.     Please refer to Patient Instructions for Blood Glucose Monitoring and Insulin/Medications Dosing Guide"  in media tab for additional information. Please  also refer to " Patient Self Inventory" in the Media  tab for reviewed elements of pertinent patient history.  Judee Clara Freeney participated in the discussions, expressed understanding, and voiced agreement with the above plans.  All questions were answered to her satisfaction. she is encouraged to contact clinic should she have any questions or concerns prior to her return visit.   Follow up plan: - Return in about 4 months (around 06/04/2023) for Diabetes F/U with A1c in office, No previsit labs, Bring meter and logs.  Rayetta Pigg, [facility] [address] [place], [place] [address] Phone: [phone] Fax: [phone]  02/03/2023, 3:36 PM

## 2023-02-13 DIAGNOSIS — E1140 Type 2 diabetes mellitus with diabetic neuropathy, unspecified: Secondary | ICD-10-CM | POA: Diagnosis not present

## 2023-02-13 DIAGNOSIS — E1151 Type 2 diabetes mellitus with diabetic peripheral angiopathy without gangrene: Secondary | ICD-10-CM | POA: Diagnosis not present

## 2023-02-14 ENCOUNTER — Other Ambulatory Visit: Payer: Self-pay | Admitting: Internal Medicine

## 2023-02-14 DIAGNOSIS — J189 Pneumonia, unspecified organism: Secondary | ICD-10-CM

## 2023-02-17 ENCOUNTER — Ambulatory Visit (INDEPENDENT_AMBULATORY_CARE_PROVIDER_SITE_OTHER): Payer: Medicare Other | Admitting: Internal Medicine

## 2023-02-17 ENCOUNTER — Encounter: Payer: Self-pay | Admitting: Internal Medicine

## 2023-02-17 VITALS — BP 136/71 | HR 77 | Ht 59.0 in | Wt 147.0 lb

## 2023-02-17 DIAGNOSIS — R059 Cough, unspecified: Secondary | ICD-10-CM | POA: Diagnosis not present

## 2023-02-17 DIAGNOSIS — J40 Bronchitis, not specified as acute or chronic: Secondary | ICD-10-CM

## 2023-02-17 DIAGNOSIS — Z1152 Encounter for screening for COVID-19: Secondary | ICD-10-CM | POA: Diagnosis not present

## 2023-02-17 MED ORDER — DOXYCYCLINE HYCLATE 100 MG PO TABS: 100.0000 mg | ORAL_TABLET | Freq: Two times a day (BID) | ORAL | 0 refills | Status: DC

## 2023-02-17 NOTE — Progress Notes (Unsigned)
   HPI:[name] [name] is a 82 y.o. female who presents for evaluation of Cough (Cough since 02/13/23 better today but productive with green/yellow mucus.) . For the details of today's visit, please refer to the assessment and plan.  Physical Exam: Vitals:   02/17/23 1509  BP: 136/71  Pulse: 77  SpO2: 93%  Weight: 147 lb (66.7 kg)  Height: '4\' 11"'$  (1.499 m)     Physical Exam Constitutional:      General: She is not in acute distress.    Appearance: Normal appearance.  HENT:     Nose: Congestion present.     Mouth/Throat:     Mouth: Mucous membranes are moist.     Pharynx: No oropharyngeal exudate.  Cardiovascular:     Rate and Rhythm: Normal rate and regular rhythm.  Pulmonary:     Effort: Pulmonary effort is normal. No respiratory distress.     Breath sounds: No wheezing or rhonchi.      Assessment & Plan:   [name] [name] was seen today for cough.  Bronchitis Assessment & Plan: Patient has five days of productive cough and malaise. Subjective fever at home. She has not know sick contacts and stays home mostly. She has history of pneumonia and has previously needed hospitalization for pneumonia.  She is taking robitussin at home.    Plan: Test for Covid/Flu/RSV Has only been 5 days, but will give antibiotic to start given age and history Follow up if symptoms worsen or fail to improve   Orders: -     Doxycycline Hyclate; Take 1 tablet (100 mg total) by mouth 2 (two) times daily. 1 po bid  Dispense: 10 tablet; Refill: 0 -     COVID-19, Flu A+B and RSV      [name] Dy, MD

## 2023-02-17 NOTE — Patient Instructions (Signed)
Thank you, Ms.Greydis H Grenier for allowing [place] to provide your care today.    - doxycycline (VIBRA-TABS) 100 MG tablet; Take 1 tablet (100 mg total) by mouth 2 (two) times daily. 1 po bid  Dispense: 10 tablet; Refill: 0    Reminders: I have sent antibiotic to your pharmacy. We will schedule you follow up and test you for Covid/Flu/RSV. I will follow up  with results.     Tamsen Snider, M.D.

## 2023-02-18 DIAGNOSIS — J40 Bronchitis, not specified as acute or chronic: Secondary | ICD-10-CM | POA: Insufficient documentation

## 2023-02-18 NOTE — Assessment & Plan Note (Addendum)
Patient has five days of productive cough and malaise. Subjective fever at home. She has not know sick contacts and stays home mostly. She has history of pneumonia and has previously needed hospitalization for pneumonia. She is taking robitussin at home. She has    Plan: Test for Covid/Flu/RSV Has only been 5 days, but will give antibiotic to start given age and history Follow up if symptoms worsen or fail to improve

## 2023-02-19 LAB — COVID-19, FLU A+B AND RSV
Influenza A, NAA: NOT DETECTED
Influenza B, NAA: NOT DETECTED
RSV, NAA: NOT DETECTED
SARS-CoV-2, NAA: NOT DETECTED

## 2023-02-23 ENCOUNTER — Telehealth: Payer: Self-pay | Admitting: Internal Medicine

## 2023-02-23 DIAGNOSIS — R053 Chronic cough: Secondary | ICD-10-CM

## 2023-02-23 MED ORDER — PROMETHAZINE-DM 6.25-15 MG/5ML PO SYRP: 2.5000 mL | ORAL_SOLUTION | Freq: Four times a day (QID) | ORAL | 0 refills | Status: DC | PRN

## 2023-02-23 NOTE — Telephone Encounter (Signed)
Promethazine-DM added for cough relief.

## 2023-02-23 NOTE — Telephone Encounter (Signed)
Called in on patient behalf.  Pt had visit on 3/5 still not feeling 100% better. Wants a call back.

## 2023-02-26 ENCOUNTER — Telehealth: Payer: Self-pay | Admitting: Cardiology

## 2023-02-26 NOTE — Telephone Encounter (Signed)
Pt c/o medication issue:  1. Name of Medication: promethazine-dextromethorphan (PROMETHAZINE-DM) 6.25-15 MG/5ML syrup   2. How are you currently taking this medication (dosage and times per day)? N/a  3. Are you having a reaction (difficulty breathing--STAT)? No  4. What is your medication issue? Pts daughter, [name] states that Dr. Doren Custard called in the medication for the pt, but they aren't sure if the pt should take it because it has "dm" on it. Please advise.

## 2023-02-26 NOTE — Telephone Encounter (Signed)
Will forward to pharmD for advice.

## 2023-02-26 NOTE — Telephone Encounter (Signed)
Patient notified and verbalized understanding. 

## 2023-02-26 NOTE — Telephone Encounter (Signed)
LVM

## 2023-02-26 NOTE — Telephone Encounter (Signed)
Returned patient call and told them that its fine to go ahead and take the medication.

## 2023-02-26 NOTE — Telephone Encounter (Signed)
Patient daughter called can not take this medicine because it has a DM on the back of it. Please Return call [phone]

## 2023-03-06 ENCOUNTER — Telehealth: Payer: Self-pay | Admitting: Internal Medicine

## 2023-03-06 DIAGNOSIS — J40 Bronchitis, not specified as acute or chronic: Secondary | ICD-10-CM

## 2023-03-06 MED ORDER — BENZONATATE 100 MG PO CAPS: 200.0000 mg | ORAL_CAPSULE | Freq: Three times a day (TID) | ORAL | 0 refills | Status: DC | PRN

## 2023-03-06 NOTE — Telephone Encounter (Signed)
Called in on patient behalf, patient still has bad cough. Current med is not helping . Wants to see if provider can send in stronger cough med. Wants a call back

## 2023-03-06 NOTE — Telephone Encounter (Signed)
Patient aware.

## 2023-03-16 NOTE — Progress Notes (Deleted)
    Cardiology Office Note  Date: 03/16/2023   ID: [name] [name], DOB December 25, 1940, MRN [id number]  History of Present Illness: [name] [name] is an 82 y.o. female last seen in July 2023.  Physical Exam: VS:  There were no vitals taken for this visit., BMI There is no height or weight on file to calculate BMI.  Wt Readings from Last 3 Encounters:  02/17/23 147 lb (66.7 kg)  02/03/23 149 lb 3.2 oz (67.7 kg)  11/11/22 130 lb (59 kg)    General: Patient appears comfortable at rest. HEENT: Conjunctiva and lids normal, oropharynx clear with moist mucosa. Neck: Supple, no elevated JVP or carotid bruits, no thyromegaly. Lungs: Clear to auscultation, nonlabored breathing at rest. Cardiac: Regular rate and rhythm, no S3 or significant systolic murmur, no pericardial rub. Abdomen: Soft, nontender, no hepatomegaly, bowel sounds present, no guarding or rebound. Extremities: No pitting edema, distal pulses 2+. Skin: Warm and dry. Musculoskeletal: No kyphosis. Neuropsychiatric: Alert and oriented x3, affect grossly appropriate.  ECG:  An ECG dated 11/11/2022 was personally reviewed today and demonstrated:  Sinus bradycardia with second-degree type I heart block.  Labwork: 05/21/2022: Magnesium 1.9; TSH 2.469 11/11/2022: ALT 29; AST 25; BUN 24; Creatinine, Ser 1.47; Hemoglobin 11.8; Platelets 297; Potassium 5.3; Sodium 138     Component Value Date/Time   CHOL 90 04/07/2022 1226   CHOL 121 11/21/2021 1115   TRIG 120 04/07/2022 1226   HDL 36 (L) 04/07/2022 1226   HDL 39 (L) 11/21/2021 1115   CHOLHDL 2.5 04/07/2022 1226   VLDL 24 04/07/2022 1226   LDLCALC 30 04/07/2022 1226   LDLCALC 55 11/21/2021 1115   LDLCALC 109 (H) 03/07/2020 1137   Other Studies Reviewed Today:  Cardiac monitor July 2023: ZIO XT reviewed.  6 days, 11 hours analyzed.   Predominant rhythm is sinus with prolonged PR interval.  Heart rate ranged from 38 bpm up to 108 bpm with average heart rate 61 bpm. There were  rare PACs representing less than 1% total beats. There were rare PVCs including ventricular couplets representing less than 1% total beats. Rare and brief episodes of second-degree type I heart block were noted.  There was a 3.2-second pause associated with this as well as a dropped sinus beat.  This was not a patient triggered event.  Assessment and Plan:  1.  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  2.  Multivessel CAD status post CABG in June 2021 with LIMA to LAD, SVG to RCA, and SVG to OM1.  LVEF 55 to 60% by echocardiogram in July 2022.  3.  Mixed hyperlipidemia, on Lipitor.  Last LDL 30 in April 2023.  4.  CKD stage IIIb, creatinine 1.47 in November 2023.  Disposition:  Follow up {follow up:15908}  Signed, Satira Sark, M.D., F.A.C.C. [place] at [facility]

## 2023-03-17 ENCOUNTER — Ambulatory Visit: Payer: Medicare Other | Admitting: Cardiology

## 2023-04-06 ENCOUNTER — Other Ambulatory Visit: Payer: Self-pay | Admitting: Cardiology

## 2023-04-07 ENCOUNTER — Ambulatory Visit: Payer: Medicare Other | Attending: Cardiology | Admitting: Nurse Practitioner

## 2023-04-07 ENCOUNTER — Encounter: Payer: Self-pay | Admitting: Nurse Practitioner

## 2023-04-07 ENCOUNTER — Telehealth: Payer: Self-pay | Admitting: Nurse Practitioner

## 2023-04-07 VITALS — BP 116/78 | HR 61 | Ht 59.0 in | Wt 148.6 lb

## 2023-04-07 DIAGNOSIS — I459 Conduction disorder, unspecified: Secondary | ICD-10-CM | POA: Diagnosis not present

## 2023-04-07 DIAGNOSIS — R011 Cardiac murmur, unspecified: Secondary | ICD-10-CM | POA: Diagnosis not present

## 2023-04-07 DIAGNOSIS — G4733 Obstructive sleep apnea (adult) (pediatric): Secondary | ICD-10-CM

## 2023-04-07 DIAGNOSIS — Z79899 Other long term (current) drug therapy: Secondary | ICD-10-CM

## 2023-04-07 DIAGNOSIS — K469 Unspecified abdominal hernia without obstruction or gangrene: Secondary | ICD-10-CM

## 2023-04-07 DIAGNOSIS — I25118 Atherosclerotic heart disease of native coronary artery with other forms of angina pectoris: Secondary | ICD-10-CM | POA: Diagnosis not present

## 2023-04-07 DIAGNOSIS — I441 Atrioventricular block, second degree: Secondary | ICD-10-CM

## 2023-04-07 DIAGNOSIS — N183 Chronic kidney disease, stage 3 unspecified: Secondary | ICD-10-CM | POA: Diagnosis not present

## 2023-04-07 DIAGNOSIS — R001 Bradycardia, unspecified: Secondary | ICD-10-CM | POA: Diagnosis not present

## 2023-04-07 DIAGNOSIS — I10 Essential (primary) hypertension: Secondary | ICD-10-CM | POA: Diagnosis not present

## 2023-04-07 DIAGNOSIS — E785 Hyperlipidemia, unspecified: Secondary | ICD-10-CM | POA: Diagnosis not present

## 2023-04-07 DIAGNOSIS — R0689 Other abnormalities of breathing: Secondary | ICD-10-CM | POA: Diagnosis not present

## 2023-04-07 MED ORDER — NITROGLYCERIN 0.4 MG SL SUBL: 0.4000 mg | SUBLINGUAL_TABLET | SUBLINGUAL | 3 refills | Status: DC | PRN

## 2023-04-07 NOTE — Patient Instructions (Signed)
Medication Instructions:  Your physician recommends that you continue on your current medications as directed. Please refer to the Current Medication list given to you today.  Medication Refilled  Labwork: Lipid, LFT due in 2 weeks @ [facility]  Testing/Procedures: Your physician has requested that you have an echocardiogram. Echocardiography is a painless test that uses sound waves to create images of your heart. It provides your doctor with information about the size and shape of your heart and how well your heart's chambers and valves are working. This procedure takes approximately one hour. There are no restrictions for this procedure. Please do NOT wear cologne, perfume, aftershave, or lotions (deodorant is allowed). Please arrive 15 minutes prior to your appointment time.   2 View Chest Xray  Follow-Up:  Your physician recommends that you schedule a follow-up appointment in: 6 months  Any Other Special Instructions Will Be Listed Below (If Applicable).  You have been referred to Pulmonology  If you need a refill on your cardiac medications before your next appointment, please call your pharmacy.

## 2023-04-07 NOTE — Progress Notes (Unsigned)
Office Visit    Patient Name: [name] [name] Date of Encounter: 04/07/2023  PCP:  Billie Lade, MD   [facility]  Cardiologist:  Nona Dell, MD  Advanced Practice Provider:  Sharlene Dory, NP Electrophysiologist:  None   Chief Complaint    [name] [name] is a 82 y.o. female with a hx of CAD, s/p CABG in 2021, intermittent bradycardia with second degree type 1 and 2:1 AV block, HTN, T2DM, GERD, CKD, depression, anxiety, and OA, who presents today for scheduled follow-up.    Past Medical History    Past Medical History:  Diagnosis Date   Anxiety    CAD (coronary artery disease)    Multivessel status post CABG June 2021 - LIMA to LAD, SVG to distal RCA, SVG to OM1   Chronic obstructive pulmonary disease, unspecified    Depression    Essential hypertension    GERD    History of pneumonia    Incarcerated ventral hernia    Osteoarthritis    Type 2 diabetes mellitus    Past Surgical History:  Procedure Laterality Date   CATARACT EXTRACTION Bilateral 2023   CORONARY ARTERY BYPASS GRAFT N/A 05/22/2020   Procedure: CORONARY ARTERY BYPASS GRAFTING (CABG) x3 LIMA TO LAD, SVG TO OM1, SVG TO DISTAL RIGHT;  Surgeon: Corliss Skains, MD;  Location: [facility] OR;  Service: Open Heart Surgery;  Laterality: N/A;   LEFT HEART CATH AND CORONARY ANGIOGRAPHY N/A 05/21/2020   Procedure: LEFT HEART CATH AND CORONARY ANGIOGRAPHY;  Surgeon: Swaz[place], Peter M, MD;  Location: [facility] INVASIVE CV LAB;  Service: Cardiovascular;  Laterality: N/A;   NEPHRECTOMY     TEE WITHOUT CARDIOVERSION N/A 05/22/2020   Procedure: TRANSESOPHAGEAL ECHOCARDIOGRAM (TEE);  Surgeon: Corliss Skains, MD;  Location: [facility] OR;  Service: Open Heart Surgery;  Laterality: N/A;   VEIN HARVEST Right 05/22/2020   Procedure: Open Vein Harvest;  Surgeon: Corliss Skains, MD;  Location[facility] OR;  Service: Open Heart Surgery;  Laterality: Right;   VENTRAL HERNIA REPAIR  09/01/2017   mistaken entry     Allergies  Allergies  Allergen Reactions   Iodinated Contrast Media Other (See Comments)    Unknown reaction. Due to kidney issues. Do not use Unknown reaction. Due to kidney issues. Do not use   Codeine Rash   Morphine And Related Other (See Comments) and Rash    Unknown Reaction Unknown Reaction   Other     Pt has multiple allergies per pt but unsure of all.    Penicillins Other (See Comments)    Unknown reaction Unknown reaction    History of Present Illness    [name] [name] is a 82 y.o. female with a PMH as mentioned above.   Was seen in ED 05/2022 for evaluation for bradycardia by Dr. Eden Emms. Was noted to have episodic second degree type 1 heart block with episodes of 2:1 AV block. Was not on any AV nodal blockers. Recommended for outpatient monitor.   Last seen by Dr. Diona Browner on 07/03/2022. Denied any syncope or dizziness. 7 day Zio monitor arranged and revealed predominately SR, with episodes of bradycardia, rare PAC's/PVC's, and rare and brief episodes of second-degree type 1 block, there was 3.2 second pause associated with dropped sinus beat, not triggered event. Was recommended based on findings did not need PPM at the time.   Today she presents for follow-up with her daughter. She states she is doing good. Denies any recent chest pain,  shortness of breath, palpitations, syncope, presyncope, dizziness, orthopnea, PND, swelling or significant weight changes, acute bleeding, or claudication. Very active and dose her own mowing and household chores/work around the house. Admits to rare episodes of stinging chest pain when she gets stressed, but denies any recent symptoms. Denies any shortness of breath, palpitations, syncope, presyncope, dizziness, orthopnea, PND, peripheral swelling or significant weight changes, acute bleeding, or claudication.   EKGs/Labs/Other Studies Reviewed:   The following studies were reviewed today:   EKG:  EKG is not ordered today.     Cardiac monitor 07/2022: Predominant rhythm is sinus with prolonged PR interval.  Heart rate ranged from 38 bpm up to 108 bpm with average heart rate 61 bpm. There were rare PACs representing less than 1% total beats. There were rare PVCs including ventricular couplets representing less than 1% total beats. Rare and brief episodes of second-degree type I heart block were noted.  There was a 3.2-second pause associated with this as well as a dropped sinus beat.  This was not a patient triggered event.  Echo 06/2021: 1. Left ventricular ejection fraction, by estimation, is 55 to 60%. The  left ventricle has normal function. The left ventricle has no regional  wall motion abnormalities. There is mild left ventricular hypertrophy.  Left ventricular diastolic parameters  were normal.   2. Right ventricular systolic function is normal. The right ventricular  size is normal. There is normal pulmonary artery systolic pressure. The  estimated right ventricular systolic pressure is 16.8 mmHg.   3. The mitral valve is grossly normal. Trivial mitral valve  regurgitation.   4. The aortic valve is tricuspid. There is mild calcification of the  aortic valve. Aortic valve regurgitation is not visualized. Mild aortic  valve sclerosis is present, with no evidence of aortic valve stenosis.   5. The inferior vena cava is normal in size with greater than 50%  respiratory variability, suggesting right atrial pressure of 3 mmHg.  Recent Labs: 05/21/2022: Magnesium 1.9; TSH 2.469 11/11/2022: ALT 29; BUN 24; Creatinine, Ser 1.47; Hemoglobin 11.8; Platelets 297; Potassium 5.3; Sodium 138  Recent Lipid Panel    Component Value Date/Time   CHOL 90 04/07/2022 1226   CHOL 121 11/21/2021 1115   TRIG 120 04/07/2022 1226   HDL 36 (L) 04/07/2022 1226   HDL 39 (L) 11/21/2021 1115   CHOLHDL 2.5 04/07/2022 1226   VLDL 24 04/07/2022 1226   LDLCALC 30 04/07/2022 1226   LDLCALC 55 11/21/2021 1115   LDLCALC 109 (H)  03/07/2020 1137    Home Medications   Current Meds  Medication Sig   Accu-Chek Softclix Lancets lancets Use as instructed   aspirin EC 81 MG tablet Take 1 tablet (81 mg total) by mouth daily. Swallow whole.   atorvastatin (LIPITOR) 40 MG tablet TAKE 1 TABLET BY MOUTH AT BEDTIME.   benzonatate (TESSALON) 100 MG capsule Take 2 capsules (200 mg total) by mouth 3 (three) times daily as needed for up to 20 doses for cough.   Biotin w/ Vitamins C & E (HAIR SKIN & NAILS GUMMIES PO) Take 2 each by mouth every morning.   bisacodyl (DULCOLAX) 5 MG EC tablet Take 1 tablet (5 mg total) by mouth daily as needed for moderate constipation.   blood glucose meter kit and supplies Dispense based on patient and insurance preference. Use up to four times daily as directed. (FOR ICD-10 E10.9, E11.9).   cetirizine (ZYRTEC) 10 MG tablet Take 1 tablet (10 mg total) by mouth  daily as needed (allergies).   doxycycline (VIBRA-TABS) 100 MG tablet Take 1 tablet (100 mg total) by mouth 2 (two) times daily. 1 po bid   fish oil-omega-3 fatty acids 1000 MG capsule Take 1 g by mouth daily.   glucose blood (ACCU-CHEK GUIDE) test strip Use as instructed to monitor glucose twice daily   glucose blood test strip Use as instructed to monitor glucose twice daily   guaiFENesin-dextromethorphan (ROBITUSSIN DM) 100-10 MG/5ML syrup Take 5 mLs by mouth every 4 (four) hours as needed for cough.   metFORMIN (GLUCOPHAGE) 500 MG tablet Take 1 tablet (500 mg total) by mouth daily with breakfast.   Multiple Vitamin (MULITIVITAMIN WITH MINERALS) TABS Take 1 tablet by mouth daily.   nitroGLYCERIN (NITROSTAT) 0.4 MG SL tablet Place 1 tablet (0.4 mg total) under the tongue every 5 (five) minutes x 3 doses as needed for chest pain (if no relief after 3rd dose, proceed to the ED or call 911).   polyethylene glycol (MIRALAX / GLYCOLAX) 17 g packet Take 17 g by mouth daily as needed for mild constipation or moderate constipation.   prednisoLONE  acetate (PRED FORTE) 1 % ophthalmic suspension    promethazine-dextromethorphan (PROMETHAZINE-DM) 6.25-15 MG/5ML syrup Take 2.5 mLs by mouth 4 (four) times daily as needed for cough.     Review of Systems    All other systems reviewed and are otherwise negative except as noted above.  Physical Exam    VS:  BP 116/78 (BP Location: Left Arm, Patient Position: Sitting, Cuff Size: Normal)   Pulse 61   Ht 4\' 11"  (1.499 m)   Wt 148 lb 9.6 oz (67.4 kg)   SpO2 96%   BMI 30.01 kg/m  , BMI Body mass index is 30.01 kg/m.  Wt Readings from Last 3 Encounters:  04/07/23 148 lb 9.6 oz (67.4 kg)  02/17/23 147 lb (66.7 kg)  02/03/23 149 lb 3.2 oz (67.7 kg)     GEN: Well nourished, well developed, in no acute distress. HEENT: normal. Neck: Supple, no JVD, carotid bruits, or masses. Cardiac: S1/S2, RRR, Grade 2/6 murmur, no rubs, no gallops. No clubbing, cyanosis, edema.  Radials/PT 2+ and equal bilaterally.  Respiratory:  Respirations regular and unlabored, coarse to auscultation bilaterally. GI: significantly sized hernia located along LLE, has been present since 1975 per patient's report. MS: No deformity or atrophy. Skin: Warm and dry, no rash. Neuro:  Strength and sensation are intact. Psych: Normal affect, talkative, distracted.  Assessment & Plan    CAD, s/p CABG in 2021, HLD, medication management Admits to atypical chest pain during periods of stress, has not had for a long time. Denies any recent symptoms. No indication for ischemic evaluation at this time. She is due for a lipid panel to be drawn. Will arrange FLP and LFT for evaluation. Will refill NG.  Continue aspirin and atorvastatin. Heart healthy diet and regular cardiovascular exercise encouraged. ED precautions discussed.    Intermittent bradycardia with second degree type 1 and 2:1 AV block, bradycardia Previous monitor revealed predominant sinus rhythm with periods of bradycardia, few episodes of brief heart block, longest  pause was 3.2 seconds not associated with triggered event.  Dr. Diona Browner stated there were no findings that would suggest she needed a pacemaker at the time.  Denies any symptoms. Not on any AV nodal blockers. Will continue to monitor. ED precautions discussed.   Coarse breath sounds Recent URI symptoms that she experiences along with her daughter.  Coarse breath sounds noted on  exam.  Will obtain 2 view chest x-ray for evaluation.  No medication changes at this time. Continue to follow with PCP. Second hand tobacco exposure, daughter smokes. Will refer to pulmonology- see below.   Murmur Grade 2/6 murmur noted on exam.  No significant valvular abnormalities noted on echocardiogram in 2022.  Will update echocardiogram at this time.  Asymptomatic.  No medication changes at this time. Heart healthy diet and regular cardiovascular exercise encouraged.   HTN Blood pressure stable. Discussed to monitor BP at home at least 2 hours after medications and sitting for 5-10 minutes.  Continue current medication regimen. Heart healthy diet and regular cardiovascular exercise encouraged.   CKD 3a - 3b Most recent serum creatinine 1.47 with eGFR 36.  Baseline serum creatinine is around 1.18-1.38.  She defers labs at this time as she has upcoming office visit with Dr. Durwin Nora.  Avoid nephrotoxic agents.  No medication changes at this time.  Encouraged adequate hydration. Continue to follow with PCP.  Hernia Significantly sized hernia to left lower quadrant of abdomen, has been present since 1975 per her report.  Discussed/recommended that she discuss this with Dr. Durwin Nora.  Will route my note to him so he is aware.  Care precautions and ED precautions discussed.  8. OSA? Today daughter admits that she sees her mother occasionally stop breathing at nighttime.  Stated she has sleep study done previously at [facility], at that time she did not need a CPAP.  Will refer her to pulmonology for evaluation at this  time.   Disposition: Follow up in 6 month(s) with Nona Dell, MD or APP.  Signed, Sharlene Dory, NP 04/09/2023, 9:34 AM [facility]

## 2023-04-07 NOTE — Telephone Encounter (Signed)
Pt said she can no longer get Tradjenta from [facility]. They no longer carry it. Pt will call around and see who has it and call us back.

## 2023-04-15 ENCOUNTER — Other Ambulatory Visit (HOSPITAL_COMMUNITY)
Admission: RE | Admit: 2023-04-15 | Discharge: 2023-04-15 | Disposition: A | Discharge status: Home / Self Care | Payer: Medicare Other | Source: Ambulatory Visit | Attending: Nurse Practitioner | Admitting: Nurse Practitioner

## 2023-04-15 ENCOUNTER — Ambulatory Visit (HOSPITAL_COMMUNITY)
Admission: RE | Admit: 2023-04-15 | Discharge: 2023-04-15 | Disposition: A | Discharge status: Home / Self Care | Payer: Medicare Other | Source: Ambulatory Visit | Attending: Nurse Practitioner | Admitting: Nurse Practitioner

## 2023-04-15 DIAGNOSIS — Z79899 Other long term (current) drug therapy: Secondary | ICD-10-CM | POA: Diagnosis not present

## 2023-04-15 DIAGNOSIS — R0602 Shortness of breath: Secondary | ICD-10-CM | POA: Diagnosis not present

## 2023-04-15 DIAGNOSIS — R0989 Other specified symptoms and signs involving the circulatory and respiratory systems: Secondary | ICD-10-CM | POA: Diagnosis not present

## 2023-04-15 LAB — HEPATIC FUNCTION PANEL
ALT: 27 U/L (ref 0–44)
AST: 24 U/L (ref 15–41)
Albumin: 3.6 g/dL (ref 3.5–5.0)
Alkaline Phosphatase: 54 U/L (ref 38–126)
Bilirubin, Direct: 0.1 mg/dL (ref 0.0–0.2)
Indirect Bilirubin: 0.9 mg/dL (ref 0.3–0.9)
Total Bilirubin: 1 mg/dL (ref 0.3–1.2)
Total Protein: 6.9 g/dL (ref 6.5–8.1)

## 2023-04-15 LAB — LIPID PANEL
Cholesterol: 100 mg/dL (ref 0–200)
HDL: 37 mg/dL — ABNORMAL LOW (ref >40)
LDL Cholesterol: 43 mg/dL (ref 0–99)
Total CHOL/HDL Ratio: 2.7 RATIO
Triglycerides: 102 mg/dL (ref <150)
VLDL: 20 mg/dL (ref 0–40)

## 2023-04-17 ENCOUNTER — Telehealth: Payer: Self-pay | Admitting: *Deleted

## 2023-04-17 NOTE — Telephone Encounter (Signed)
Lesle Chris, LPN 4/0/9811  9:14 PM EDT Back to Top    Notified, copy to pcp.

## 2023-04-17 NOTE — Telephone Encounter (Signed)
LABS -  Labs look good. Sharlene Dory, AGNP-C  CHEST X-RAY -   No acute findings. Please fax results to PCP's office. Thanks! Sharlene Dory, AGNP-C

## 2023-04-23 ENCOUNTER — Other Ambulatory Visit (HOSPITAL_COMMUNITY): Payer: Self-pay

## 2023-04-23 ENCOUNTER — Encounter (HOSPITAL_COMMUNITY): Payer: Self-pay | Admitting: Emergency Medicine

## 2023-04-23 ENCOUNTER — Other Ambulatory Visit: Payer: Self-pay

## 2023-04-23 ENCOUNTER — Emergency Department (HOSPITAL_COMMUNITY): Payer: Medicare Other

## 2023-04-23 ENCOUNTER — Other Ambulatory Visit (HOSPITAL_COMMUNITY): Payer: Medicare Other

## 2023-04-23 ENCOUNTER — Observation Stay (HOSPITAL_COMMUNITY)
Admission: EM | Admit: 2023-04-23 | Discharge: 2023-04-24 | Disposition: A | Discharge status: Home / Self Care | Payer: Medicare Other | Attending: Emergency Medicine | Admitting: Emergency Medicine

## 2023-04-23 DIAGNOSIS — Z7985 Long-term (current) use of injectable non-insulin antidiabetic drugs: Secondary | ICD-10-CM | POA: Insufficient documentation

## 2023-04-23 DIAGNOSIS — I2510 Atherosclerotic heart disease of native coronary artery without angina pectoris: Secondary | ICD-10-CM | POA: Diagnosis present

## 2023-04-23 DIAGNOSIS — E1121 Type 2 diabetes mellitus with diabetic nephropathy: Secondary | ICD-10-CM | POA: Diagnosis present

## 2023-04-23 DIAGNOSIS — Z1152 Encounter for screening for COVID-19: Secondary | ICD-10-CM | POA: Insufficient documentation

## 2023-04-23 DIAGNOSIS — L89319 Pressure ulcer of right buttock, unspecified stage: Secondary | ICD-10-CM | POA: Insufficient documentation

## 2023-04-23 DIAGNOSIS — N1832 Chronic kidney disease, stage 3b: Secondary | ICD-10-CM | POA: Diagnosis not present

## 2023-04-23 DIAGNOSIS — Z7982 Long term (current) use of aspirin: Secondary | ICD-10-CM | POA: Diagnosis not present

## 2023-04-23 DIAGNOSIS — E871 Hypo-osmolality and hyponatremia: Secondary | ICD-10-CM | POA: Diagnosis not present

## 2023-04-23 DIAGNOSIS — Z79899 Other long term (current) drug therapy: Secondary | ICD-10-CM | POA: Insufficient documentation

## 2023-04-23 DIAGNOSIS — Z6830 Body mass index (BMI) 30.0-30.9, adult: Secondary | ICD-10-CM | POA: Diagnosis not present

## 2023-04-23 DIAGNOSIS — I25110 Atherosclerotic heart disease of native coronary artery with unstable angina pectoris: Secondary | ICD-10-CM | POA: Insufficient documentation

## 2023-04-23 DIAGNOSIS — Z7984 Long term (current) use of oral hypoglycemic drugs: Secondary | ICD-10-CM | POA: Diagnosis not present

## 2023-04-23 DIAGNOSIS — J449 Chronic obstructive pulmonary disease, unspecified: Secondary | ICD-10-CM | POA: Insufficient documentation

## 2023-04-23 DIAGNOSIS — R778 Other specified abnormalities of plasma proteins: Secondary | ICD-10-CM | POA: Diagnosis not present

## 2023-04-23 DIAGNOSIS — E1140 Type 2 diabetes mellitus with diabetic neuropathy, unspecified: Secondary | ICD-10-CM | POA: Diagnosis not present

## 2023-04-23 DIAGNOSIS — R0789 Other chest pain: Secondary | ICD-10-CM | POA: Diagnosis not present

## 2023-04-23 DIAGNOSIS — I2089 Other forms of angina pectoris: Secondary | ICD-10-CM

## 2023-04-23 DIAGNOSIS — E1122 Type 2 diabetes mellitus with diabetic chronic kidney disease: Secondary | ICD-10-CM | POA: Insufficient documentation

## 2023-04-23 DIAGNOSIS — R079 Chest pain, unspecified: Secondary | ICD-10-CM | POA: Diagnosis not present

## 2023-04-23 DIAGNOSIS — D696 Thrombocytopenia, unspecified: Secondary | ICD-10-CM | POA: Diagnosis not present

## 2023-04-23 DIAGNOSIS — R7989 Other specified abnormal findings of blood chemistry: Secondary | ICD-10-CM | POA: Diagnosis not present

## 2023-04-23 DIAGNOSIS — I209 Angina pectoris, unspecified: Secondary | ICD-10-CM | POA: Diagnosis not present

## 2023-04-23 DIAGNOSIS — Z951 Presence of aortocoronary bypass graft: Secondary | ICD-10-CM | POA: Insufficient documentation

## 2023-04-23 DIAGNOSIS — S80869A Insect bite (nonvenomous), unspecified lower leg, initial encounter: Secondary | ICD-10-CM

## 2023-04-23 DIAGNOSIS — L89329 Pressure ulcer of left buttock, unspecified stage: Secondary | ICD-10-CM | POA: Diagnosis not present

## 2023-04-23 DIAGNOSIS — R030 Elevated blood-pressure reading, without diagnosis of hypertension: Secondary | ICD-10-CM | POA: Diagnosis not present

## 2023-04-23 DIAGNOSIS — I129 Hypertensive chronic kidney disease with stage 1 through stage 4 chronic kidney disease, or unspecified chronic kidney disease: Secondary | ICD-10-CM | POA: Insufficient documentation

## 2023-04-23 DIAGNOSIS — W57XXXA Bitten or stung by nonvenomous insect and other nonvenomous arthropods, initial encounter: Secondary | ICD-10-CM | POA: Diagnosis not present

## 2023-04-23 DIAGNOSIS — E669 Obesity, unspecified: Secondary | ICD-10-CM | POA: Diagnosis not present

## 2023-04-23 DIAGNOSIS — K2100 Gastro-esophageal reflux disease with esophagitis, without bleeding: Secondary | ICD-10-CM | POA: Diagnosis present

## 2023-04-23 LAB — COMPREHENSIVE METABOLIC PANEL
ALT: 29 U/L (ref 0–44)
AST: 38 U/L (ref 15–41)
Albumin: 3.5 g/dL (ref 3.5–5.0)
Alkaline Phosphatase: 46 U/L (ref 38–126)
Anion gap: 10 (ref 5–15)
BUN: 27 mg/dL — ABNORMAL HIGH (ref 8–23)
CO2: 21 mmol/L — ABNORMAL LOW (ref 22–32)
Calcium: 8.3 mg/dL — ABNORMAL LOW (ref 8.9–10.3)
Chloride: 97 mmol/L — ABNORMAL LOW (ref 98–111)
Creatinine, Ser: 1.41 mg/dL — ABNORMAL HIGH (ref 0.44–1.00)
GFR, Estimated: 37 mL/min — ABNORMAL LOW (ref >60)
Glucose, Bld: 127 mg/dL — ABNORMAL HIGH (ref 70–99)
Potassium: 4.9 mmol/L (ref 3.5–5.1)
Sodium: 128 mmol/L — ABNORMAL LOW (ref 135–145)
Total Bilirubin: 0.6 mg/dL (ref 0.3–1.2)
Total Protein: 6.8 g/dL (ref 6.5–8.1)

## 2023-04-23 LAB — CBC WITH DIFFERENTIAL/PLATELET
Abs Immature Granulocytes: 0.01 10*3/uL (ref 0.00–0.07)
Basophils Absolute: 0 10*3/uL (ref 0.0–0.1)
Basophils Relative: 1 %
Eosinophils Absolute: 0 10*3/uL (ref 0.0–0.5)
Eosinophils Relative: 0 %
HCT: 35.5 % — ABNORMAL LOW (ref 36.0–46.0)
Hemoglobin: 12.1 g/dL (ref 12.0–15.0)
Immature Granulocytes: 0 %
Lymphocytes Relative: 35 %
Lymphs Abs: 0.9 10*3/uL (ref 0.7–4.0)
MCH: 31.9 pg (ref 26.0–34.0)
MCHC: 34.1 g/dL (ref 30.0–36.0)
MCV: 93.7 fL (ref 80.0–100.0)
Monocytes Absolute: 0.2 10*3/uL (ref 0.1–1.0)
Monocytes Relative: 8 %
Neutro Abs: 1.4 10*3/uL — ABNORMAL LOW (ref 1.7–7.7)
Neutrophils Relative %: 56 %
Platelets: 109 10*3/uL — ABNORMAL LOW (ref 150–400)
RBC: 3.79 MIL/uL — ABNORMAL LOW (ref 3.87–5.11)
RDW: 12.2 % (ref 11.5–15.5)
WBC: 2.5 10*3/uL — ABNORMAL LOW (ref 4.0–10.5)
nRBC: 0 % (ref 0.0–0.2)

## 2023-04-23 LAB — TROPONIN I (HIGH SENSITIVITY)
Troponin I (High Sensitivity): 22 ng/L — ABNORMAL HIGH (ref <18)
Troponin I (High Sensitivity): 21 ng/L — ABNORMAL HIGH (ref <18)
Troponin I (High Sensitivity): 23 ng/L — ABNORMAL HIGH (ref <18)
Troponin I (High Sensitivity): 24 ng/L — ABNORMAL HIGH (ref <18)

## 2023-04-23 LAB — GLUCOSE, CAPILLARY: Glucose-Capillary: 126 mg/dL — ABNORMAL HIGH (ref 70–99)

## 2023-04-23 LAB — SARS CORONAVIRUS 2 BY RT PCR: SARS Coronavirus 2 by RT PCR: NEGATIVE

## 2023-04-23 MED ORDER — NITROGLYCERIN 0.4 MG SL SUBL: 0.4000 mg | SUBLINGUAL_TABLET | SUBLINGUAL | Status: DC | PRN

## 2023-04-23 MED ORDER — DOXYCYCLINE HYCLATE 100 MG PO TABS
100.0000 mg | ORAL_TABLET | Freq: Two times a day (BID) | ORAL | Status: DC
  Administered 2023-04-24: 100 mg via ORAL
  Filled 2023-04-23 (×2): qty 1

## 2023-04-23 MED ORDER — ACETAMINOPHEN 325 MG PO TABS
650.0000 mg | ORAL_TABLET | ORAL | Status: DC | PRN
  Administered 2023-04-24: 650 mg via ORAL

## 2023-04-23 MED ORDER — ENOXAPARIN SODIUM 40 MG/0.4ML IJ SOSY: 40.0000 mg | PREFILLED_SYRINGE | INTRAMUSCULAR | Status: DC

## 2023-04-23 MED ORDER — ASPIRIN 81 MG PO TBEC
81.0000 mg | DELAYED_RELEASE_TABLET | Freq: Every day | ORAL | Status: DC
  Administered 2023-04-24: 81 mg via ORAL
  Filled 2023-04-23: qty 1

## 2023-04-23 MED ORDER — ONDANSETRON HCL 4 MG/2ML IJ SOLN: 4.0000 mg | Freq: Four times a day (QID) | INTRAMUSCULAR | Status: DC | PRN

## 2023-04-23 MED ORDER — ENOXAPARIN SODIUM 30 MG/0.3ML IJ SOSY
30.0000 mg | PREFILLED_SYRINGE | INTRAMUSCULAR | Status: DC
  Administered 2023-04-23: 30 mg via SUBCUTANEOUS
  Filled 2023-04-23: qty 0.3

## 2023-04-23 MED ORDER — ACETAMINOPHEN 325 MG PO TABS
650.0000 mg | ORAL_TABLET | Freq: Four times a day (QID) | ORAL | Status: DC | PRN
  Filled 2023-04-23: qty 2

## 2023-04-23 MED ORDER — DOXYCYCLINE HYCLATE 100 MG PO TABS: 100.0000 mg | ORAL_TABLET | Freq: Once | ORAL | Status: DC

## 2023-04-23 MED ORDER — IPRATROPIUM-ALBUTEROL 0.5-2.5 (3) MG/3ML IN SOLN
3.0000 mL | Freq: Once | RESPIRATORY_TRACT | Status: AC
  Administered 2023-04-23: 3 mL via RESPIRATORY_TRACT
  Filled 2023-04-23: qty 3

## 2023-04-23 MED ORDER — OMEGA-3 FATTY ACIDS 1000 MG PO CAPS: 1.0000 g | ORAL_CAPSULE | Freq: Every day | ORAL | Status: DC

## 2023-04-23 MED ORDER — POLYETHYLENE GLYCOL 3350 17 G PO PACK: 17.0000 g | PACK | Freq: Every day | ORAL | Status: DC | PRN

## 2023-04-23 MED ORDER — INSULIN ASPART 100 UNIT/ML IJ SOLN: 0.0000 [IU] | Freq: Every day | INTRAMUSCULAR | Status: DC

## 2023-04-23 MED ORDER — INSULIN ASPART 100 UNIT/ML IJ SOLN
0.0000 [IU] | Freq: Three times a day (TID) | INTRAMUSCULAR | Status: DC
  Administered 2023-04-24: 3 [IU] via SUBCUTANEOUS
  Administered 2023-04-24: 5 [IU] via SUBCUTANEOUS

## 2023-04-23 MED ORDER — DOXYCYCLINE HYCLATE 100 MG PO TABS
100.0000 mg | ORAL_TABLET | Freq: Once | ORAL | Status: AC
  Administered 2023-04-23: 100 mg via ORAL
  Filled 2023-04-23: qty 1

## 2023-04-23 MED ORDER — ATORVASTATIN CALCIUM 40 MG PO TABS
40.0000 mg | ORAL_TABLET | Freq: Every day | ORAL | Status: DC
  Administered 2023-04-23: 40 mg via ORAL
  Filled 2023-04-23: qty 1

## 2023-04-23 MED ORDER — ADULT MULTIVITAMIN W/MINERALS CH
1.0000 | ORAL_TABLET | Freq: Every day | ORAL | Status: DC
  Administered 2023-04-23 – 2023-04-24 (×2): 1 via ORAL
  Filled 2023-04-23 (×2): qty 1

## 2023-04-23 NOTE — ED Triage Notes (Signed)
Pt via POV sent by [facility] due to tightness in central chest radiating to upper abdomen. Hx open heart surgery and hernia. BP was slightly elevated at the time. Pt has had poor appetite and fatigue and feels nauseated on arrival.

## 2023-04-23 NOTE — ED Provider Notes (Signed)
[place] EMERGENCY DEPARTMENT AT [facility] Provider Note   CSN: [id number] Arrival date & time: 04/23/23  1403     History  Chief Complaint  Patient presents with   Chest Pain    ITATY STRENGTH is a 82 y.o. female.   Chest Pain   82 year old female presents emergency department with complaints of chest "heaviness" fatigue, tick bite.  Patient states the symptoms began this past Friday when she was outside and noticed "several" ticks all over her body.  She states she and her family member picked up multiple ticks.  She states that since then, has felt generalized fatigue, "brain fog".  She states that 2 to 3 days ago, began to experience chest heaviness worsened with exertion.  Denies any shortness of breath, fever, change in cough, abdominal pain, nausea, vomiting.  States she is concerned about Lyme disease.  Past medical history significant for CAD with prior CABG in 2021, COPD, hypertension, GERD, diabetes mellitus type 2, CKD 3B  Home Medications Prior to Admission medications   Medication Sig Start Date End Date Taking? Authorizing Provider  acetaminophen (TYLENOL) 325 MG tablet Take 650 mg by mouth every 6 (six) hours as needed for moderate pain.   Yes [provider]  aspirin EC 81 MG tablet Take 1 tablet (81 mg total) by mouth daily. Swallow whole. 12/25/20  Yes Jonelle Sidle, MD  atorvastatin (LIPITOR) 40 MG tablet TAKE 1 TABLET BY MOUTH AT BEDTIME. 04/06/23  Yes Jonelle Sidle, MD  cetirizine (ZYRTEC) 10 MG tablet Take 1 tablet (10 mg total) by mouth daily as needed (allergies). 05/20/22  Yes Paseda, Baird Kay, FNP  fish oil-omega-3 fatty acids 1000 MG capsule Take 1 g by mouth daily.   Yes [provider]  guaiFENesin-dextromethorphan (ROBITUSSIN DM) 100-10 MG/5ML syrup Take 5 mLs by mouth every 4 (four) hours as needed for cough. 10/27/22  Yes Billie Lade, MD  linagliptin (TRADJENTA) 5 MG TABS tablet Take 1 tablet (5 mg total)  by mouth daily. 02/03/23  Yes Dani Gobble, NP  metFORMIN (GLUCOPHAGE) 500 MG tablet Take 1 tablet (500 mg total) by mouth daily with breakfast. 02/03/23  Yes Dani Gobble, NP  Multiple Vitamin (MULITIVITAMIN WITH MINERALS) TABS Take 1 tablet by mouth daily.   Yes [provider]  nitroGLYCERIN (NITROSTAT) 0.4 MG SL tablet Place 1 tablet (0.4 mg total) under the tongue every 5 (five) minutes x 3 doses as needed for chest pain (if no relief after 3rd dose, proceed to the ED or call 911). 04/07/23  Yes Sharlene Dory, NP  polyethylene glycol (MIRALAX / GLYCOLAX) 17 g packet Take 17 g by mouth daily as needed for mild constipation or moderate constipation. 12/01/22  Yes Billie Lade, MD  Accu-Chek Softclix Lancets lancets Use as instructed 02/12/21   Dani Gobble, NP  benzonatate (TESSALON) 100 MG capsule Take 2 capsules (200 mg total) by mouth 3 (three) times daily as needed for up to 20 doses for cough. Patient not taking: Reported on 04/23/2023 03/06/23   Billie Lade, MD  blood glucose meter kit and supplies Dispense based on patient and insurance preference. Use up to four times daily as directed. (FOR ICD-10 E10.9, E11.9). 01/08/23   Dani Gobble, NP  doxycycline (VIBRA-TABS) 100 MG tablet Take 1 tablet (100 mg total) by mouth 2 (two) times daily. 1 po bid Patient not taking: Reported on 04/23/2023 02/17/23   Gardenia Phlegm, MD  glucose  blood (ACCU-CHEK GUIDE) test strip Use as instructed to monitor glucose twice daily 12/04/21   Dani Gobble, NP  glucose blood test strip Use as instructed to monitor glucose twice daily 08/28/21   Dani Gobble, NP  prednisoLONE acetate (PRED FORTE) 1 % ophthalmic suspension  03/18/22   [provider]  promethazine-dextromethorphan (PROMETHAZINE-DM) 6.25-15 MG/5ML syrup Take 2.5 mLs by mouth 4 (four) times daily as needed for cough. Patient not taking: Reported on 04/23/2023 02/23/23   Billie Lade, MD  metoprolol  tartrate (LOPRESSOR) 25 MG tablet Take 0.5 tablets (12.5 mg total) by mouth 2 (two) times daily. 07/03/21 07/03/21  Cleora Fleet, MD      Allergies    Iodinated contrast media, Codeine, Morphine and related, Other, and Penicillins    Review of Systems   Review of Systems  Cardiovascular:  Positive for chest pain.  All other systems reviewed and are negative.   Physical Exam Updated Vital Signs BP (!) 146/58 (BP Location: Right Arm)   Pulse 62   Temp 99 F (37.2 C) (Oral)   Resp 20   Ht 4\' 11"  (1.499 m)   Wt 64.8 kg   SpO2 98%   BMI 28.84 kg/m  Physical Exam Vitals and nursing note reviewed.  Constitutional:      General: She is not in acute distress.    Appearance: She is well-developed.  HENT:     Head: Normocephalic and atraumatic.     Nose: No congestion or rhinorrhea.     Mouth/Throat:     Pharynx: No oropharyngeal exudate or posterior oropharyngeal erythema.  Eyes:     Conjunctiva/sclera: Conjunctivae normal.  Cardiovascular:     Rate and Rhythm: Normal rate and regular rhythm.     Heart sounds: No murmur heard. Pulmonary:     Effort: Pulmonary effort is normal. No respiratory distress.     Breath sounds: Wheezing present. No rhonchi or rales.     Comments: Faint wheeze auscultated bilateral lung fields. Abdominal:     Palpations: Abdomen is soft.     Tenderness: There is no abdominal tenderness. There is no guarding.  Musculoskeletal:        General: No swelling.     Cervical back: Neck supple.     Right lower leg: No edema.     Left lower leg: No edema.  Skin:    General: Skin is warm and dry.     Capillary Refill: Capillary refill takes less than 2 seconds.     Comments: No obvious rash appreciated.  Neurological:     Mental Status: She is alert.  Psychiatric:        Mood and Affect: Mood normal.     ED Results / Procedures / Treatments   Labs (all labs ordered are listed, but only abnormal results are displayed) Labs Reviewed   COMPREHENSIVE METABOLIC PANEL - Abnormal; Notable for the following components:      Result Value   Sodium 128 (*)    Chloride 97 (*)    CO2 21 (*)    Glucose, Bld 127 (*)    BUN 27 (*)    Creatinine, Ser 1.41 (*)    Calcium 8.3 (*)    GFR, Estimated 37 (*)    All other components within normal limits  CBC WITH DIFFERENTIAL/PLATELET - Abnormal; Notable for the following components:   WBC 2.5 (*)    RBC 3.79 (*)    HCT 35.5 (*)    Platelets 109 (*)  Neutro Abs 1.4 (*)    All other components within normal limits  TROPONIN I (HIGH SENSITIVITY) - Abnormal; Notable for the following components:   Troponin I (High Sensitivity) 22 (*)    All other components within normal limits  TROPONIN I (HIGH SENSITIVITY) - Abnormal; Notable for the following components:   Troponin I (High Sensitivity) 21 (*)    All other components within normal limits  SARS CORONAVIRUS 2 BY RT PCR  RESPIRATORY PANEL BY PCR  LYME DISEASE SEROLOGY W/REFLEX  SPOTTED FEVER GROUP ANTIBODIES  TROPONIN I (HIGH SENSITIVITY)    EKG None  Radiology DG Chest Port 1 View  Result Date: 04/23/2023 CLINICAL DATA:  Chest pain. EXAM: PORTABLE CHEST 1 VIEW COMPARISON:  04/15/2023. FINDINGS: No consolidation or pulmonary edema. Stable cardiac and mediastinal contours with postoperative changes of median sternotomy and CABG. No pleural effusion or pneumothorax. Visualized bones and upper abdomen are unremarkable. IMPRESSION: No evidence of acute cardiopulmonary disease. Electronically Signed   By: Orvan Falconer M.D.   On: 04/23/2023 15:02    Procedures Procedures    Medications Ordered in ED Medications  doxycycline (VIBRA-TABS) tablet 100 mg (has no administration in time range)  acetaminophen (TYLENOL) tablet 650 mg (has no administration in time range)  aspirin EC tablet 81 mg (has no administration in time range)  doxycycline (VIBRA-TABS) tablet 100 mg (has no administration in time range)  atorvastatin  (LIPITOR) tablet 40 mg (has no administration in time range)  nitroGLYCERIN (NITROSTAT) SL tablet 0.4 mg (has no administration in time range)  polyethylene glycol (MIRALAX / GLYCOLAX) packet 17 g (has no administration in time range)  multivitamin with minerals tablet 1 tablet (has no administration in time range)  insulin aspart (novoLOG) injection 0-15 Units (has no administration in time range)  insulin aspart (novoLOG) injection 0-5 Units (has no administration in time range)  ipratropium-albuterol (DUONEB) 0.5-2.5 (3) MG/3ML nebulizer solution 3 mL (3 mLs Nebulization Given 04/23/23 1558)  doxycycline (VIBRA-TABS) tablet 100 mg (100 mg Oral Given 04/23/23 1814)    ED Course/ Medical Decision Making/ A&P Clinical Course as of 04/23/23 1901  Thu Apr 23, 2023  1423 EKG 12-Lead [CR]  1623 Consulted cardiology Dr. Diona Browner who recommended admission through hospitalist for further workup regarding patient's exertional angina and elevated troponin.  Recommended no heparin or additional medical therapy at this time besides patient's daily meds and last troponin continues to trend upward. [CR]  1642 Consulted hospitalist Dr. Randol Kern who agreed with admission and assume further treatment/care. [CR]    Clinical Course User Index [CR] Peter Garter, PA                             Medical Decision Making Amount and/or Complexity of Data Reviewed Labs: ordered. Radiology: ordered. ECG/medicine tests:  Decision-making details documented in ED Course.  Risk Prescription drug management. Decision regarding hospitalization.   This patient presents to the ED for concern of tick bite/chest pain, this involves an extensive number of treatment options, and is a complaint that carries with it a high risk of complications and morbidity.  The differential diagnosis includes Lyme disease, [place] spotted fever, ACS, PE, pneumothorax, musculoskeletal, aortic dissection, aortic aneurysm   Co  morbidities that complicate the patient evaluation  See HPI   Additional history obtained:  Additional history obtained from EMR External records from outside source obtained and reviewed including hospital records   Lab Tests:  I Ordered,  and personally interpreted labs.  The pertinent results include: Patient with new leukopenia of 2.5.  No evidence of anemia.  Thrombocytopenia of 109 of which seems to also be new.  Multiple electrolyte abnormalities including hyponatremia, hypocalcemia and decreased bicarb of 128, 21, 8.3 respectively.  Patient with baseline renal dysfunction with creatinine 1.41, BUN of 27 and GFR of 37.  No transaminitis.  Taper analysis pending.  Patient with initial troponin of 21 with repeat pending.  Coronavirus negative.   Imaging Studies ordered:  I ordered imaging studies including chest x-ray I independently visualized and interpreted imaging which showed no acute cardiopulmonary abnormalities I agree with the radiologist interpretation   Cardiac Monitoring: / EKG:  The patient was maintained on a cardiac monitor.  I personally viewed and interpreted the cardiac monitored which showed an underlying rhythm of: Sinus rhythm with first-degree AV block.  Right axis deviation.  T wave inversion in inferior and lateral leads.  Consultations Obtained:  See ED course  Problem List / ED Course / Critical interventions / Medication management  Exertional angina, elevated troponin I ordered medication including DuoNeb, doxycycline  Reevaluation of the patient after these medicines showed that the patient improved I have reviewed the patients home medicines and have made adjustments as needed   Social Determinants of Health:  Denies tobacco, illicit drug use   Test / Admission - Considered:  Exertional angina, elevated troponin Vitals signs significant for mild hypertension with a pressure of 146/58. Otherwise within normal range and stable throughout  visit. Laboratory/imaging studies significant for: See above 82 year old female presents emergency department with complaints of exertional angina along with generalized fatigue after multiple tick bites.  Patient was cardiac history with history of CAD with prior CABG, first and second-degree AV block at different times.  Patient found with initial elevated troponin of 21.  Given reporting of exertional angina with associated elevation in troponin with patient's cardiac history, admission deemed most appropriate.  Patient empirically begun on doxycycline for treatment of potential tickborne illness.  Treatment plan were discussed at length with patient and they knowledge understanding was agreeable to said plan.  Appropriate consultations were made as described in the ED course.  Patient was stable upon admission to the hospital.         Final Clinical Impression(s) / ED Diagnoses Final diagnoses:  Exertional angina  Elevated troponin    Rx / DC Orders ED Discharge Orders     None         Peter Garter, [place] 04/23/23 Pauline Aus, MD 04/24/23 [phone]

## 2023-04-23 NOTE — ED Notes (Addendum)
Bedside report given to [facility] LPN

## 2023-04-23 NOTE — H&P (Signed)
TRH H&P   Patient Demographics:    [name] [name], is a 82 y.o. female  MRN: [id number]   DOB - Nov 28, 1941  Admit Date - 04/23/2023  Outpatient Primary MD for the patient is Billie Lade, MD  Referring MD/NP/PA: Sharion Settler  Patient coming from: home  Chief Complaint  Patient presents with   Chest Pain      HPI:    [name] [name]  is a 82 y.o. female, Past medical history significant for CAD with prior CABG in 2021, COPD, hypertension, GERD, diabetes mellitus type 2, CKD 3B, depression. -Yesterday secondary to complaints of chest pain, she describes it as heaviness, reports symptoms began last Friday, she reports generalized weakness, fatigue, brain fog, she reports chest heaviness worsened with exertion, she denies any dyspnea, URI symptoms, congestion, he does as well reports several tick bites last Friday over her body's, which has been removed that same day.  She currently denies any chest pain while in ED. -In ED her EKG was nonacute, troponins non-ACS pattern 22>> 21, she had low-grade temperature 99.3, she is currently chest pain-free, [facility] hospitalist consulted to admit.   Review of systems:      A full 10 point Review of Systems was done, except as stated above, all other Review of Systems were negative.   With Past History of the following :    Past Medical History:  Diagnosis Date   Anxiety    CAD (coronary artery disease)    Multivessel status post CABG June 2021 - LIMA to LAD, SVG to distal RCA, SVG to OM1   Chronic obstructive pulmonary disease, unspecified (HCC)    Depression    Essential hypertension    GERD    History of pneumonia    Incarcerated ventral hernia    Osteoarthritis    Type 2 diabetes mellitus (HCC)       Past Surgical History:  Procedure Laterality Date   CATARACT EXTRACTION Bilateral 2023   CORONARY ARTERY BYPASS GRAFT N/A  05/22/2020   Procedure: CORONARY ARTERY BYPASS GRAFTING (CABG) x3 LIMA TO LAD, SVG TO OM1, SVG TO DISTAL RIGHT;  Surgeon: Corliss Skains, MD;  Location: [facility] OR;  Service: Open Heart Surgery;  Laterality: N/A;   LEFT HEART CATH AND CORONARY ANGIOGRAPHY N/A 05/21/2020   Procedure: LEFT HEART CATH AND CORONARY ANGIOGRAPHY;  Surgeon: [place], Peter M, MD;  Location: [facility] INVASIVE CV LAB;  Service: Cardiovascular;  Laterality: N/A;   NEPHRECTOMY     TEE WITHOUT CARDIOVERSION N/A 05/22/2020   Procedure: TRANSESOPHAGEAL ECHOCARDIOGRAM (TEE);  Surgeon: Corliss Skains, MD;  Location: [facility] OR;  Service: Open Heart Surgery;  Laterality: N/A;   VEIN HARVEST Right 05/22/2020   Procedure: Open Vein Harvest;  Surgeon: Corliss Skains, MD;  Location: [facility] OR;  Service: Open Heart Surgery;  Laterality: Right;   VENTRAL HERNIA REPAIR  09/01/2017   mistaken entry  Social History:     Social History   Tobacco Use   Smoking status: Never    Passive exposure: Never   Smokeless tobacco: Never  Substance Use Topics   Alcohol use: No        Family History :     Family History  Problem Relation Age of Onset   CAD Father    Heart failure Sister    CAD Daughter    Heart disease Daughter       Home Medications:   Prior to Admission medications   Medication Sig Start Date End Date Taking? Authorizing Provider  acetaminophen (TYLENOL) 325 MG tablet Take 650 mg by mouth every 6 (six) hours as needed for moderate pain.   Yes [provider]  aspirin EC 81 MG tablet Take 1 tablet (81 mg total) by mouth daily. Swallow whole. 12/25/20  Yes Jonelle Sidle, MD  atorvastatin (LIPITOR) 40 MG tablet TAKE 1 TABLET BY MOUTH AT BEDTIME. 04/06/23  Yes Jonelle Sidle, MD  cetirizine (ZYRTEC) 10 MG tablet Take 1 tablet (10 mg total) by mouth daily as needed (allergies). 05/20/22  Yes Paseda, Baird Kay, FNP  fish oil-omega-3 fatty acids 1000 MG capsule Take 1 g by mouth daily.   Yes  [provider]  guaiFENesin-dextromethorphan (ROBITUSSIN DM) 100-10 MG/5ML syrup Take 5 mLs by mouth every 4 (four) hours as needed for cough. 10/27/22  Yes Billie Lade, MD  linagliptin (TRADJENTA) 5 MG TABS tablet Take 1 tablet (5 mg total) by mouth daily. 02/03/23  Yes Dani Gobble, NP  metFORMIN (GLUCOPHAGE) 500 MG tablet Take 1 tablet (500 mg total) by mouth daily with breakfast. 02/03/23  Yes Dani Gobble, NP  Multiple Vitamin (MULITIVITAMIN WITH MINERALS) TABS Take 1 tablet by mouth daily.   Yes [provider]  nitroGLYCERIN (NITROSTAT) 0.4 MG SL tablet Place 1 tablet (0.4 mg total) under the tongue every 5 (five) minutes x 3 doses as needed for chest pain (if no relief after 3rd dose, proceed to the ED or call 911). 04/07/23  Yes Sharlene Dory, NP  polyethylene glycol (MIRALAX / GLYCOLAX) 17 g packet Take 17 g by mouth daily as needed for mild constipation or moderate constipation. 12/01/22  Yes Billie Lade, MD  Accu-Chek Softclix Lancets lancets Use as instructed 02/12/21   Dani Gobble, NP  benzonatate (TESSALON) 100 MG capsule Take 2 capsules (200 mg total) by mouth 3 (three) times daily as needed for up to 20 doses for cough. Patient not taking: Reported on 04/23/2023 03/06/23   Billie Lade, MD  blood glucose meter kit and supplies Dispense based on patient and insurance preference. Use up to four times daily as directed. (FOR ICD-10 E10.9, E11.9). 01/08/23   Dani Gobble, NP  doxycycline (VIBRA-TABS) 100 MG tablet Take 1 tablet (100 mg total) by mouth 2 (two) times daily. 1 po bid Patient not taking: Reported on 04/23/2023 02/17/23   Gardenia Phlegm, MD  glucose blood (ACCU-CHEK GUIDE) test strip Use as instructed to monitor glucose twice daily 12/04/21   Dani Gobble, NP  glucose blood test strip Use as instructed to monitor glucose twice daily 08/28/21   Dani Gobble, NP  prednisoLONE acetate (PRED FORTE) 1 % ophthalmic suspension   03/18/22   [provider]  promethazine-dextromethorphan (PROMETHAZINE-DM) 6.25-15 MG/5ML syrup Take 2.5 mLs by mouth 4 (four) times daily as needed for cough. Patient not taking: Reported on 04/23/2023 02/23/23  Billie Lade, MD  metoprolol tartrate (LOPRESSOR) 25 MG tablet Take 0.5 tablets (12.5 mg total) by mouth 2 (two) times daily. 07/03/21 07/03/21  Cleora Fleet, MD     Allergies:     Allergies  Allergen Reactions   Iodinated Contrast Media Other (See Comments)    Unknown reaction. Due to kidney issues. Do not use Unknown reaction. Due to kidney issues. Do not use   Codeine Rash   Morphine And Related Other (See Comments) and Rash    Unknown Reaction Unknown Reaction   Other     Pt has multiple allergies per pt but unsure of all.    Penicillins Other (See Comments)    Unknown reaction Unknown reaction     Physical Exam:   Vitals  Blood pressure 120/63, pulse (!) 54, temperature 99.3 F (37.4 C), temperature source Oral, resp. rate 20, height 4\' 11"  (1.499 m), weight 64.8 kg, SpO2 97 %.   1. General frail elderly female, in bed in NAD  2. Normal affect and insight, Not Suicidal or Homicidal, Awake Alert, Oriented X 3.  3. No F.N deficits, ALL C.Nerves Intact, Strength 5/5 all 4 extremities, Sensation intact all 4 extremities, Plantars down going.  4. Ears and Eyes appear Normal, Conjunctivae clear, PERRLA. Moist Oral Mucosa.  5. Supple Neck, No JVD, No cervical lymphadenopathy appriciated, No Carotid Bruits.  6. Symmetrical Chest wall movement, Good air movement bilaterally, CTAB.  7. RRR, No Gallops, Rubs or Murmurs, No Parasternal Heave.  8. Positive Bowel Sounds, Abdomen Soft, No tenderness, No organomegaly appriciated,No rebound -guarding or rigidity.  9.  No Cyanosis, Normal Skin Turgor, no rash, tick bites present in lower feet, no rash.  10. Good muscle tone,  joints appear normal , no effusions, Normal ROM.           Data  Review:    CBC Recent Labs  Lab 04/23/23 1446  WBC 2.5*  HGB 12.1  HCT 35.5*  PLT 109*  MCV 93.7  MCH 31.9  MCHC 34.1  RDW 12.2  LYMPHSABS 0.9  MONOABS 0.2  EOSABS 0.0  BASOSABS 0.0   ------------------------------------------------------------------------------------------------------------------  Chemistries  Recent Labs  Lab 04/23/23 1446  NA 128*  K 4.9  CL 97*  CO2 21*  GLUCOSE 127*  BUN 27*  CREATININE 1.41*  CALCIUM 8.3*  AST 38  ALT 29  ALKPHOS 46  BILITOT 0.6   ------------------------------------------------------------------------------------------------------------------ estimated creatinine clearance is 25.6 mL/min (A) (by C-G formula based on SCr of 1.41 mg/dL (H)). ------------------------------------------------------------------------------------------------------------------ No results for input(s): "TSH", "T4TOTAL", "T3FREE", "THYROIDAB" in the last 72 hours.  Invalid input(s): "FREET3"  Coagulation profile No results for input(s): "INR", "PROTIME" in the last 168 hours. ------------------------------------------------------------------------------------------------------------------- No results for input(s): "DDIMER" in the last 72 hours. -------------------------------------------------------------------------------------------------------------------  Cardiac Enzymes No results for input(s): "CKMB", "TROPONINI", "MYOGLOBIN" in the last 168 hours.  Invalid input(s): "CK" ------------------------------------------------------------------------------------------------------------------ No results found for: "BNP"   ---------------------------------------------------------------------------------------------------------------  Urinalysis    Component Value Date/Time   COLORURINE COLORLESS (A) 11/11/2022 1154   APPEARANCEUR CLEAR 11/11/2022 1154   LABSPEC 1.005 11/11/2022 1154   PHURINE 7.0 11/11/2022 1154   GLUCOSEU NEGATIVE  11/11/2022 1154   HGBUR MODERATE (A) 11/11/2022 1154   BILIRUBINUR NEGATIVE 11/11/2022 1154   KETONESUR NEGATIVE 11/11/2022 1154   PROTEINUR NEGATIVE 11/11/2022 1154   UROBILINOGEN 0.2 05/30/2010 1903   NITRITE NEGATIVE 11/11/2022 1154   LEUKOCYTESUR TRACE (A) 11/11/2022 1154    ----------------------------------------------------------------------------------------------------------------   Imaging Results:    DG  Chest Port 1 View  Result Date: 04/23/2023 CLINICAL DATA:  Chest pain. EXAM: PORTABLE CHEST 1 VIEW COMPARISON:  04/15/2023. FINDINGS: No consolidation or pulmonary edema. Stable cardiac and mediastinal contours with postoperative changes of median sternotomy and CABG. No pleural effusion or pneumothorax. Visualized bones and upper abdomen are unremarkable. IMPRESSION: No evidence of acute cardiopulmonary disease. Electronically Signed   By: Orvan Falconer M.D.   On: 04/23/2023 15:02    EKG:   Vent. rate 80 BPM PR interval 220 ms QRS duration 84 ms QT/QTcB 374/431 ms P-R-T axes 68 90 -43 Sinus rhythm with 1st degree A-V block Rightward axis Septal infarct (cited on or before 25-Sep-2022) ST & T wave abnormality, consider inferior ischemia Abnormal ECG    Assessment & Plan:    Active Problems:   Gastro-esophageal reflux disease with esophagitis   Atherosclerotic heart disease of native coronary artery without angina pectoris   Chronic kidney disease, stage 3b (HCC)   Type 2 diabetes mellitus with diabetic nephropathy (HCC)   Chest pain   Chest pain CAD status post CABG in 2021 -Presents with chest pain with activity, resolved with rest, troponins non-ACS pattern, she is currently chest pain-free . -Given her risk factors he will be admitted for further workup, will monitor overnight, continue with sublingual nitro, and trend troponins overnight, will obtain 2D echo. -Continue with home aspirin and statin. -Urology consulted by ED, to see in a.m.  Tick  bite -Reported bite last week, multiple tick bites, they were removed within 24 hours. -Continue with doxycycline, will give first dose at 200 mg, then continue 100 mg oral twice daily, will need to finish doxycycline course for 10 to 14 days - lyme  disease and [facility] studies has been sent in ED, but even if negative does not rule out given studies are done 5 days within her tick bites -Finding of hyponatremia and thrombocytopenia are concerning [facility], LFTs within normal limit, will continue to monitor -Low-grade temperature, will check COVID and respiratory viral panel.  Hyponatremia  -Continue with IV fluids, recheck in a.m.  CKD stage IIIb -Continue with IV fluids, avoid nephrotoxic medications  Thrombocytopenia -Steege is a normal limit, continue to monitor   Dyslipidemia -Continue with home dose statin  Diabetes type 2 -Hold metformin and Tradjenta, will keep an insulin sliding scale  DVT Prophylaxis  Lovenox   AM Labs Ordered, also please review Full Orders  Family Communication: Admission, patients condition and plan of care including tests being ordered have been discussed with the patient who indicate understanding and agree with the plan and Code Status.  Code Status Full  Likely DC to  inpatient  Condition GUARDED    Consults called: cardiology By ED    Admission status: observation    Time spent in minutes : 70 minutes   Huey Bienenstock M.D on 04/23/2023 at 5:27 PM   [facility] - Office  [phone]

## 2023-04-23 NOTE — Progress Notes (Addendum)
Patient is alert, oriented, verbal. She is ambulatory to bathroom with standby assist. She denies pain. Food was provided. She denies nausea.

## 2023-04-24 ENCOUNTER — Observation Stay (HOSPITAL_BASED_OUTPATIENT_CLINIC_OR_DEPARTMENT_OTHER): Payer: Medicare Other

## 2023-04-24 ENCOUNTER — Telehealth: Payer: Self-pay | Admitting: Internal Medicine

## 2023-04-24 DIAGNOSIS — K2100 Gastro-esophageal reflux disease with esophagitis, without bleeding: Secondary | ICD-10-CM

## 2023-04-24 DIAGNOSIS — R079 Chest pain, unspecified: Secondary | ICD-10-CM

## 2023-04-24 DIAGNOSIS — W57XXXD Bitten or stung by nonvenomous insect and other nonvenomous arthropods, subsequent encounter: Secondary | ICD-10-CM | POA: Diagnosis not present

## 2023-04-24 DIAGNOSIS — I25119 Atherosclerotic heart disease of native coronary artery with unspecified angina pectoris: Secondary | ICD-10-CM | POA: Diagnosis not present

## 2023-04-24 DIAGNOSIS — R0789 Other chest pain: Secondary | ICD-10-CM

## 2023-04-24 DIAGNOSIS — S80869D Insect bite (nonvenomous), unspecified lower leg, subsequent encounter: Secondary | ICD-10-CM | POA: Diagnosis not present

## 2023-04-24 DIAGNOSIS — E785 Hyperlipidemia, unspecified: Secondary | ICD-10-CM

## 2023-04-24 DIAGNOSIS — N1832 Chronic kidney disease, stage 3b: Secondary | ICD-10-CM | POA: Diagnosis not present

## 2023-04-24 DIAGNOSIS — R7989 Other specified abnormal findings of blood chemistry: Secondary | ICD-10-CM

## 2023-04-24 DIAGNOSIS — S80869A Insect bite (nonvenomous), unspecified lower leg, initial encounter: Secondary | ICD-10-CM

## 2023-04-24 DIAGNOSIS — E1121 Type 2 diabetes mellitus with diabetic nephropathy: Secondary | ICD-10-CM | POA: Diagnosis not present

## 2023-04-24 LAB — COMPREHENSIVE METABOLIC PANEL
ALT: 24 U/L (ref 0–44)
AST: 35 U/L (ref 15–41)
Albumin: 2.9 g/dL — ABNORMAL LOW (ref 3.5–5.0)
Alkaline Phosphatase: 41 U/L (ref 38–126)
Anion gap: 8 (ref 5–15)
BUN: 26 mg/dL — ABNORMAL HIGH (ref 8–23)
CO2: 21 mmol/L — ABNORMAL LOW (ref 22–32)
Calcium: 8.1 mg/dL — ABNORMAL LOW (ref 8.9–10.3)
Chloride: 102 mmol/L (ref 98–111)
Creatinine, Ser: 1.32 mg/dL — ABNORMAL HIGH (ref 0.44–1.00)
GFR, Estimated: 41 mL/min — ABNORMAL LOW (ref >60)
Glucose, Bld: 186 mg/dL — ABNORMAL HIGH (ref 70–99)
Potassium: 4.3 mmol/L (ref 3.5–5.1)
Sodium: 131 mmol/L — ABNORMAL LOW (ref 135–145)
Total Bilirubin: 0.5 mg/dL (ref 0.3–1.2)
Total Protein: 6 g/dL — ABNORMAL LOW (ref 6.5–8.1)

## 2023-04-24 LAB — ECHOCARDIOGRAM COMPLETE
Area-P 1/2: 2.6 cm2
Height: 59 in
S' Lateral: 2.8 cm
Weight: 2284.8 oz

## 2023-04-24 LAB — CBC
HCT: 33.1 % — ABNORMAL LOW (ref 36.0–46.0)
Hemoglobin: 11.3 g/dL — ABNORMAL LOW (ref 12.0–15.0)
MCH: 32 pg (ref 26.0–34.0)
MCHC: 34.1 g/dL (ref 30.0–36.0)
MCV: 93.8 fL (ref 80.0–100.0)
Platelets: 107 10*3/uL — ABNORMAL LOW (ref 150–400)
RBC: 3.53 MIL/uL — ABNORMAL LOW (ref 3.87–5.11)
RDW: 12.2 % (ref 11.5–15.5)
WBC: 2.1 10*3/uL — ABNORMAL LOW (ref 4.0–10.5)
nRBC: 0 % (ref 0.0–0.2)

## 2023-04-24 LAB — GLUCOSE, CAPILLARY
Glucose-Capillary: 159 mg/dL — ABNORMAL HIGH (ref 70–99)
Glucose-Capillary: 206 mg/dL — ABNORMAL HIGH (ref 70–99)

## 2023-04-24 MED ORDER — FAMOTIDINE 20 MG PO TABS: 20.0000 mg | ORAL_TABLET | Freq: Every day | ORAL | 1 refills | Status: DC

## 2023-04-24 MED ORDER — ISOSORBIDE MONONITRATE ER 30 MG PO TB24: 15.0000 mg | ORAL_TABLET | Freq: Every day | ORAL | Status: DC

## 2023-04-24 MED ORDER — DOXYCYCLINE HYCLATE 100 MG PO TABS: 100.0000 mg | ORAL_TABLET | Freq: Two times a day (BID) | ORAL | 0 refills | Status: AC

## 2023-04-24 MED ORDER — FAMOTIDINE 20 MG PO TABS
20.0000 mg | ORAL_TABLET | Freq: Every day | ORAL | Status: DC
  Administered 2023-04-24: 20 mg via ORAL
  Filled 2023-04-24: qty 1

## 2023-04-24 NOTE — Consult Note (Addendum)
Cardiology Consultation   Patient ID: [name] [name] MRN: [id number]; DOB: 15-Dec-1941  Admit date: 04/23/2023 Date of Consult: 04/24/2023  PCP:  Billie Lade, MD   [facility] Providers Cardiologist:  Nona Dell, MD  Cardiology APP:  Sharlene Dory, NP       Patient Profile:   [name] [name] is a 82 y.o. female with a hx of CAD (s/p CABG in 05/2020 with LIMA-LAD, SVG-dRCA and SVG-OM1), HTN, HLD and Type 2 DM who is being seen 04/24/2023 for the evaluation of chest pain at the request of Dr. Randol Kern.  History of Present Illness:   [name] [name] was examined by Sharlene Dory, NP in 03/2023 and was overall doing well at that time and denied any recent anginal symptoms. Did have occasional episodes of chest pain in the setting of stress. Was referred to Pulmonology for a sleep study.   She presented to [facility] ED on 04/23/2023 for evaluation of chest heaviness occurring for 5+ days. Reported having several tick bites around the time symptoms started and she had been experiencing generalized fatigue and "brain fog" since then.  She did have a low-grade fever on admission with temperature at 99.3. Initial labs showed WBC 2.5, Hgb 12.1, platelets 109, Na+ 128, K+ 4.9 and creatinine 1.41 (close to baseline).  LFTs normal. Initial and repeat troponin values have been mildly elevated at 22, 21, 23 and 24. Serologies for Lyme disease and [facility] spotted fever currently pending. CXR with no acute cardiopulmonary abnormalities. EKG shows normal sinus rhythm, heart rate 80 with first-degree AV block and nonspecific T- wave abnormalities along the inferior and lateral leads with TWI along the inferior leads noted on prior tracings in 10/2022.  In talking with the patient today, she reports she was in her normal state of health until last week when she experienced multiple tick bites as described above. Says at that time she started to have worsening weakness, brain fog and  episodes of chest discomfort over the next several days. She describes 2 types of chest pain, one being a pressure along her precordium which occurs at rest and lasts for seconds to a few minutes. She describes another episode of pain which is a shooting pain and lasts for a few seconds and typically occurs when stressed. She denies any specific dyspnea on exertion, orthopnea, PND or pitting edema. At the time of this encounter, she is pain-free and overall feels well.  Past Medical History:  Diagnosis Date   Anxiety    CAD (coronary artery disease)    Multivessel status post CABG June 2021 - LIMA to LAD, SVG to distal RCA, SVG to OM1   Chronic obstructive pulmonary disease, unspecified (HCC)    Depression    Essential hypertension    GERD    History of pneumonia    Incarcerated ventral hernia    Osteoarthritis    Type 2 diabetes mellitus (HCC)     Past Surgical History:  Procedure Laterality Date   CATARACT EXTRACTION Bilateral 2023   CORONARY ARTERY BYPASS GRAFT N/A 05/22/2020   Procedure: CORONARY ARTERY BYPASS GRAFTING (CABG) x3 LIMA TO LAD, SVG TO OM1, SVG TO DISTAL RIGHT;  Surgeon: Corliss Skains, MD;  Location: [facility] OR;  Service: Open Heart Surgery;  Laterality: N/A;   LEFT HEART CATH AND CORONARY ANGIOGRAPHY N/A 05/21/2020   Procedure: LEFT HEART CATH AND CORONARY ANGIOGRAPHY;  Surgeon: [place], Peter M, MD;  Location: [facility] INVASIVE CV LAB;  Service: Cardiovascular;  Laterality: N/A;   NEPHRECTOMY     TEE WITHOUT CARDIOVERSION N/A 05/22/2020   Procedure: TRANSESOPHAGEAL ECHOCARDIOGRAM (TEE);  Surgeon: Corliss Skains, MD;  Location: [facility] OR;  Service: Open Heart Surgery;  Laterality: N/A;   VEIN HARVEST Right 05/22/2020   Procedure: Open Vein Harvest;  Surgeon: Corliss Skains, MD;  Location: [facility] OR;  Service: Open Heart Surgery;  Laterality: Right;   VENTRAL HERNIA REPAIR  09/01/2017   mistaken entry     Home Medications:  Prior to Admission medications    Medication Sig Start Date End Date Taking? Authorizing Provider  acetaminophen (TYLENOL) 325 MG tablet Take 650 mg by mouth every 6 (six) hours as needed for moderate pain.   Yes [provider]  aspirin EC 81 MG tablet Take 1 tablet (81 mg total) by mouth daily. Swallow whole. 12/25/20  Yes Jonelle Sidle, MD  atorvastatin (LIPITOR) 40 MG tablet TAKE 1 TABLET BY MOUTH AT BEDTIME. 04/06/23  Yes Jonelle Sidle, MD  cetirizine (ZYRTEC) 10 MG tablet Take 1 tablet (10 mg total) by mouth daily as needed (allergies). 05/20/22  Yes Paseda, Baird Kay, FNP  fish oil-omega-3 fatty acids 1000 MG capsule Take 1 g by mouth daily.   Yes [provider]  guaiFENesin-dextromethorphan (ROBITUSSIN DM) 100-10 MG/5ML syrup Take 5 mLs by mouth every 4 (four) hours as needed for cough. 10/27/22  Yes Billie Lade, MD  linagliptin (TRADJENTA) 5 MG TABS tablet Take 1 tablet (5 mg total) by mouth daily. 02/03/23  Yes Dani Gobble, NP  metFORMIN (GLUCOPHAGE) 500 MG tablet Take 1 tablet (500 mg total) by mouth daily with breakfast. 02/03/23  Yes Dani Gobble, NP  Multiple Vitamin (MULITIVITAMIN WITH MINERALS) TABS Take 1 tablet by mouth daily.   Yes [provider]  nitroGLYCERIN (NITROSTAT) 0.4 MG SL tablet Place 1 tablet (0.4 mg total) under the tongue every 5 (five) minutes x 3 doses as needed for chest pain (if no relief after 3rd dose, proceed to the ED or call 911). 04/07/23  Yes Sharlene Dory, NP  polyethylene glycol (MIRALAX / GLYCOLAX) 17 g packet Take 17 g by mouth daily as needed for mild constipation or moderate constipation. 12/01/22  Yes Billie Lade, MD  Accu-Chek Softclix Lancets lancets Use as instructed 02/12/21   Dani Gobble, NP  benzonatate (TESSALON) 100 MG capsule Take 2 capsules (200 mg total) by mouth 3 (three) times daily as needed for up to 20 doses for cough. Patient not taking: Reported on 04/23/2023 03/06/23   Billie Lade, MD  blood glucose  meter kit and supplies Dispense based on patient and insurance preference. Use up to four times daily as directed. (FOR ICD-10 E10.9, E11.9). 01/08/23   Dani Gobble, NP  doxycycline (VIBRA-TABS) 100 MG tablet Take 1 tablet (100 mg total) by mouth 2 (two) times daily. 1 po bid Patient not taking: Reported on 04/23/2023 02/17/23   Gardenia Phlegm, MD  glucose blood (ACCU-CHEK GUIDE) test strip Use as instructed to monitor glucose twice daily 12/04/21   Dani Gobble, NP  glucose blood test strip Use as instructed to monitor glucose twice daily 08/28/21   Dani Gobble, NP  prednisoLONE acetate (PRED FORTE) 1 % ophthalmic suspension  03/18/22   [provider]  promethazine-dextromethorphan (PROMETHAZINE-DM) 6.25-15 MG/5ML syrup Take 2.5 mLs by mouth 4 (four) times daily as needed for cough. Patient not taking: Reported on 04/23/2023 02/23/23   Billie Lade, MD  metoprolol tartrate (LOPRESSOR) 25 MG tablet Take 0.5 tablets (12.5 mg total) by mouth 2 (two) times daily. 07/03/21 07/03/21  Cleora Fleet, MD    Inpatient Medications: Scheduled Meds:  aspirin EC  81 mg Oral Daily   atorvastatin  40 mg Oral QHS   doxycycline  100 mg Oral BID   enoxaparin (LOVENOX) injection  30 mg Subcutaneous Q24H   famotidine  20 mg Oral Daily   insulin aspart  0-15 Units Subcutaneous TID WC   insulin aspart  0-5 Units Subcutaneous QHS   multivitamin with minerals  1 tablet Oral Daily   Continuous Infusions:  PRN Meds: acetaminophen, acetaminophen, nitroGLYCERIN, ondansetron (ZOFRAN) IV, polyethylene glycol  Allergies:    Allergies  Allergen Reactions   Iodinated Contrast Media Other (See Comments)    Unknown reaction. Due to kidney issues. Do not use Unknown reaction. Due to kidney issues. Do not use   Codeine Rash   Morphine And Related Other (See Comments) and Rash    Unknown Reaction Unknown Reaction   Other     Pt has multiple allergies per pt but unsure of all.     Penicillins Other (See Comments)    Unknown reaction Unknown reaction    Social History:   Social History   Socioeconomic History   Marital status: Widowed    Spouse name: Not on file   Number of children: 1   Years of education: 19   Highest education level: 12th grade  Occupational History   Occupation: Retired  Tobacco Use   Smoking status: Never    Passive exposure: Never   Smokeless tobacco: Never  Vaping Use   Vaping Use: Never used  Substance and Sexual Activity   Alcohol use: No   Drug use: No   Sexual activity: Not Currently  Other Topics Concern   Not on file  Social History Narrative   Lives alone.  Has 1 daughter      Enjoys reading and cooking      Eats all food groups   Caffeine, some coffee   Water reports drinking pretty well throughout the day      Social Determinants of Health   Financial Resource Strain: Low Risk  (01/19/2023)   Overall Financial Resource Strain (CARDIA)    Difficulty of Paying Living Expenses: Not hard at all  Food Insecurity: No Food Insecurity (04/23/2023)   Hunger Vital Sign    Worried About Running Out of Food in the Last Year: Never true    Ran Out of Food in the Last Year: Never true  Transportation Needs: No Transportation Needs (04/23/2023)   PRAPARE - Administrator, Civil Service (Medical): No    Lack of Transportation (Non-Medical): No  Physical Activity: Insufficiently Active (01/19/2023)   Exercise Vital Sign    Days of Exercise per Week: 5 days    Minutes of Exercise per Session: 20 min  Stress: No Stress Concern Present (01/19/2023)   [employer] of Occupational Health - Occupational Stress Questionnaire    Feeling of Stress : Not at all  Social Connections: Moderately Isolated (01/19/2023)   Social Connection and Isolation Panel [NHANES]    Frequency of Communication with Friends and Family: More than three times a week    Frequency of Social Gatherings with Friends and Family: More than three times  a week    Attends Religious Services: More than 4 times per year    Active Member of Clubs or Organizations: No  Attends Banker Meetings: Never    Marital Status: Widowed  Intimate Partner Violence: Not At Risk (04/23/2023)   Humiliation, Afraid, Rape, and Kick questionnaire    Fear of Current or Ex-Partner: No    Emotionally Abused: No    Physically Abused: No    Sexually Abused: No    Family History:    Family History  Problem Relation Age of Onset   CAD Father    Heart failure Sister    CAD Daughter    Heart disease Daughter      ROS:  Please see the history of present illness.  All other ROS reviewed and negative.     Physical Exam/Data:   Vitals:   04/23/23 1804 04/23/23 2034 04/24/23 0144 04/24/23 0358  BP: (!) 146/58 (!) 132/50 (!) 135/55 116/65  Pulse: 62 62  70  Resp:  18 18 18   Temp: 99 F (37.2 C) 99 F (37.2 C) 98.7 F (37.1 C) 98.4 F (36.9 C)  TempSrc: Oral     SpO2: 98% 95% 100% 93%  Weight:      Height:        Intake/Output Summary (Last 24 hours) at 04/24/2023 0915 Last data filed at 04/24/2023 0300 Gross per 24 hour  Intake 360 ml  Output --  Net 360 ml      04/23/2023    2:13 PM 04/07/2023    3:04 PM 02/17/2023    3:09 PM  Last 3 Weights  Weight (lbs) 142 lb 12.8 oz 148 lb 9.6 oz 147 lb  Weight (kg) 64.774 kg 67.405 kg 66.679 kg     Body mass index is 28.84 kg/m.  General:  Well nourished, well developed female appearing in no acute distress. HEENT: normal Neck: no JVD Vascular: No carotid bruits; Distal pulses 2+ bilaterally Cardiac:  normal S1, S2; RRR; no murmur Lungs:  clear to auscultation bilaterally, no wheezing, rhonchi or rales  Abd: soft, nontender, no hepatomegaly  Ext: no pitting edema. Areas of excoriation noted.  Musculoskeletal:  No deformities, BUE and BLE strength normal and equal Skin: warm and dry  Neuro:  CNs 2-12 intact, no focal abnormalities noted Psych:  Normal affect   EKG:  The EKG was  personally reviewed and demonstrates: Normal sinus rhythm, heart rate 80 with first-degree AV block and nonspecific T- wave abnormalities along the inferior and lateral leads with TWI along the inferior leads noted on prior tracings in 10/2022.  Telemetry:  Telemetry was personally reviewed and demonstrates: Sinus bradycardia, HR in 50's to 60's. No significant pauses.   Relevant CV Studies:  Echocardiogram: 06/2021 IMPRESSIONS    1. Left ventricular ejection fraction, by estimation, is 55 to 60%. The  left ventricle has normal function. The left ventricle has no regional  wall motion abnormalities. There is mild left ventricular hypertrophy.  Left ventricular diastolic parameters  were normal.   2. Right ventricular systolic function is normal. The right ventricular  size is normal. There is normal pulmonary artery systolic pressure. The  estimated right ventricular systolic pressure is 16.8 mmHg.   3. The mitral valve is grossly normal. Trivial mitral valve  regurgitation.   4. The aortic valve is tricuspid. There is mild calcification of the  aortic valve. Aortic valve regurgitation is not visualized. Mild aortic  valve sclerosis is present, with no evidence of aortic valve stenosis.   5. The inferior vena cava is normal in size with greater than 50%  respiratory variability, suggesting right atrial pressure  of 3 mmHg.   Laboratory Data:  High Sensitivity Troponin:   Recent Labs  Lab 04/23/23 1428 04/23/23 1616 04/23/23 1829 04/23/23 2028  TROPONINIHS 22* 21* 23* 24*     Chemistry Recent Labs  Lab 04/23/23 1446 04/24/23 0407  NA 128* 131*  K 4.9 4.3  CL 97* 102  CO2 21* 21*  GLUCOSE 127* 186*  BUN 27* 26*  CREATININE 1.41* 1.32*  CALCIUM 8.3* 8.1*  GFRNONAA 37* 41*  ANIONGAP 10 8    Recent Labs  Lab 04/23/23 1446 04/24/23 0407  PROT 6.8 6.0*  ALBUMIN 3.5 2.9*  AST 38 35  ALT 29 24  ALKPHOS 46 41  BILITOT 0.6 0.5   Lipids No results for input(s):  "CHOL", "TRIG", "HDL", "LABVLDL", "LDLCALC", "CHOLHDL" in the last 168 hours.  Hematology Recent Labs  Lab 04/23/23 1446 04/24/23 0407  WBC 2.5* 2.1*  RBC 3.79* 3.53*  HGB 12.1 11.3*  HCT 35.5* 33.1*  MCV 93.7 93.8  MCH 31.9 32.0  MCHC 34.1 34.1  RDW 12.2 12.2  PLT 109* 107*   Thyroid No results for input(s): "TSH", "FREET4" in the last 168 hours.  BNPNo results for input(s): "BNP", "PROBNP" in the last 168 hours.  DDimer No results for input(s): "DDIMER" in the last 168 hours.   Radiology/Studies:  DG Chest Port 1 View  Result Date: 04/23/2023 CLINICAL DATA:  Chest pain. EXAM: PORTABLE CHEST 1 VIEW COMPARISON:  04/15/2023. FINDINGS: No consolidation or pulmonary edema. Stable cardiac and mediastinal contours with postoperative changes of median sternotomy and CABG. No pleural effusion or pneumothorax. Visualized bones and upper abdomen are unremarkable. IMPRESSION: No evidence of acute cardiopulmonary disease. Electronically Signed   By: Orvan Falconer M.D.   On: 04/23/2023 15:02     Assessment and Plan:   1.  Atypical Chest Pain - Her recent episodes of chest pain are overall atypical for a cardiac etiology as they occur at rest and typically only last for a few seconds and spontaneously resolve. - Initial and repeat Hs troponin values have been mildly elevated at 22, 21, 23 and 24. EKG shows normal sinus rhythm, heart rate 80 with first-degree AV block and nonspecific T- wave abnormalities along the inferior and lateral leads with T wave abnormalities along the inferior leads noted on prior tracings as well. - She did have an echocardiogram performed this morning with results pending (will cancel her outpatient study which is scheduled for 05/04/2023). If this is reassuring, would not anticipate further cardiac testing at this time. Could add Imdur 30 mg daily for antianginal benefit. If she continues to have episodes of discomfort as an outpatient despite medical therapy, can  consider a follow-up Lexiscan Myoview at that time.  2. CAD - She is s/p CABG in 05/2020 with LIMA-LAD, SVG-dRCA and SVG-OM1. She is undergoing cardiac workup as described above.  - Continue current medical therapy with ASA 81mg  daily and Atorvastatin 40mg  daily. Not on a beta-blocker due to baseline bradycardia.   3. HLD - FLP earlier this month showed her LDL was at 43. Continue Atorvastatin 40mg  daily.   4. History of HTN - Listed in her PMH but not currently on anti-hypertensive therapy. BP was well-controlled at 116/65 on most recent check.   5. Recent Tick Bites - Given her fatigue, low-grade fever, brain fog and weakness she has been started on Doxycycline and serologies are pending for Lyme disease and [facility] spotted fever.   For questions or updates, please contact [facility] Please consult [email or web address] for contact info under    Signed, Ellsworth Lennox, PA-C  04/24/2023 9:15 AM    Attending note:  Patient seen and examined.  Case discussed with Ms. Iran Ouch PA-C and I agree with her above findings.  Ms. Fredricka Bonine he is a patient of mine with known CAD status post CABG in 2021.  She presents with chest discomfort in the setting of several recent tick bites, brain fog, and low-grade fever on initial evaluation.  Tickborne illness titers are sent and she is being treated empirically with doxycycline per primary team.  Cardiology consulted for evaluation of chest discomfort which on discussion with her today is fairly atypical lasting only a few seconds at a time.  High-sensitivity troponin I levels are not suggestive of ACS and her ECG shows no acute findings.  She reports no active symptoms this morning.  On examination she appears comfortable.  Currently afebrile, heart rate in the 50s to 60s in sinus rhythm by telemetry.  Blood pressure 116/65.  Lungs are clear with decreased breath sounds.  Cardiac exam with RRR no gallop or rub.  Pertinent lab work  includes potassium 4.3, BUN 26, creatinine 1.32, peak high-sensitivity troponin I 24, WBC 2.1, hemoglobin 11.3, platelets 107.  Chest x-ray reports no acute process.  ECG shows sinus rhythm with prolonged PR interval, nonspecific ST-T changes.  Plan to follow-up on echocardiogram which has already been obtained per primary team.  Do not anticipate any further ischemic testing at this time however.  She does have a history of recurrent chest pain over time with mixed features, not candidate for beta-blocker in light of conduction system disease.  May be reasonable to consider low-dose Imdur as an outpatient although would not add now necessarily.  We will set up follow-up as an outpatient presuming her echocardiogram shows no significant changes.  Jonelle Sidle, M.D., F.A.C.C.

## 2023-04-24 NOTE — Progress Notes (Signed)
   04/24/23 1249  ECG Monitoring  Cardiac Rhythm Heart block  CV Strip Heart Rate 40  Heart Block Type 1st degree AVB   Telemetry call pt heart rate dropped into the 40's. Pt was sleeping in her chair. Heart rate rechecked and showing in the 50's. MD made aware.

## 2023-04-24 NOTE — Progress Notes (Signed)
Nsg Discharge Note  Admit Date:  04/23/2023 Discharge date: 04/24/2023   [name] to be D/C'd Home per MD order.  AVS completed.   Patient/caregiver able to verbalize understanding.  Discharge Medication: Allergies as of 04/24/2023       Reactions   Iodinated Contrast Media Other (See Comments)   Unknown reaction. Due to kidney issues. Do not use Unknown reaction. Due to kidney issues. Do not use   Codeine Rash   Morphine And Related Other (See Comments), Rash   Unknown Reaction Unknown Reaction   Other    Pt has multiple allergies per pt but unsure of all.    Penicillins Other (See Comments)   Unknown reaction Unknown reaction        Medication List     STOP taking these medications    benzonatate 100 MG capsule Commonly known as: TESSALON   prednisoLONE acetate 1 % ophthalmic suspension Commonly known as: PRED FORTE   promethazine-dextromethorphan 6.25-15 MG/5ML syrup Commonly known as: PROMETHAZINE-DM       TAKE these medications    Accu-Chek Softclix Lancets lancets Use as instructed   acetaminophen 325 MG tablet Commonly known as: TYLENOL Take 650 mg by mouth every 6 (six) hours as needed for moderate pain.   aspirin EC 81 MG tablet Take 1 tablet (81 mg total) by mouth daily. Swallow whole.   atorvastatin 40 MG tablet Commonly known as: LIPITOR TAKE 1 TABLET BY MOUTH AT BEDTIME.   blood glucose meter kit and supplies Dispense based on patient and insurance preference. Use up to four times daily as directed. (FOR ICD-10 E10.9, E11.9).   cetirizine 10 MG tablet Commonly known as: ZYRTEC Take 1 tablet (10 mg total) by mouth daily as needed (allergies).   doxycycline 100 MG tablet Commonly known as: VIBRA-TABS Take 1 tablet (100 mg total) by mouth 2 (two) times daily for 12 days. What changed: additional instructions   famotidine 20 MG tablet Commonly known as: PEPCID Take 1 tablet (20 mg total) by mouth daily. Start taking on: Apr 25, 2023   fish oil-omega-3 fatty acids 1000 MG capsule Take 1 g by mouth daily.   glucose blood test strip Use as instructed to monitor glucose twice daily   Accu-Chek Guide test strip Generic drug: glucose blood Use as instructed to monitor glucose twice daily   guaiFENesin-dextromethorphan 100-10 MG/5ML syrup Commonly known as: ROBITUSSIN DM Take 5 mLs by mouth every 4 (four) hours as needed for cough.   linagliptin 5 MG Tabs tablet Commonly known as: Tradjenta Take 1 tablet (5 mg total) by mouth daily.   metFORMIN 500 MG tablet Commonly known as: GLUCOPHAGE Take 1 tablet (500 mg total) by mouth daily with breakfast.   multivitamin with minerals Tabs tablet Take 1 tablet by mouth daily.   nitroGLYCERIN 0.4 MG SL tablet Commonly known as: NITROSTAT Place 1 tablet (0.4 mg total) under the tongue every 5 (five) minutes x 3 doses as needed for chest pain (if no relief after 3rd dose, proceed to the ED or call 911).   polyethylene glycol 17 g packet Commonly known as: MIRALAX / GLYCOLAX Take 17 g by mouth daily as needed for mild constipation or moderate constipation.        Discharge Assessment: Vitals:   04/24/23 1259 04/24/23 1325  BP:  (!) 128/49  Pulse: (!) 52 (!) 50  Resp:    Temp:  97.7 F (36.5 C)  SpO2: 98% 100%   Skin clean, dry  and intact without evidence of skin break down, no evidence of skin tears noted. IV catheter discontinued intact. Site without signs and symptoms of complications - no redness or edema noted at insertion site, patient denies c/o pain - only slight tenderness at site.  Dressing with slight pressure applied.  D/c Instructions-Education: Discharge instructions given to patient/family with verbalized understanding. D/c education completed with patient/family including follow up instructions, medication list, d/c activities limitations if indicated, with other d/c instructions as indicated by MD - patient able to verbalize understanding,  all questions fully answered. Patient instructed to return to ED, call 911, or call MD for any changes in condition.  Patient escorted via WC, and D/C home via private auto.  Laurena Spies, RN 04/24/2023 2:12 PM

## 2023-04-24 NOTE — Discharge Summary (Signed)
Physician Discharge Summary   Patient: [name] [name] MRN: [id number] DOB: 1941-07-31  Admit date:     04/23/2023  Discharge date: 04/24/23  Discharge Physician: Vassie Loll   PCP: Billie Lade, MD   Recommendations at discharge:  Repeat basic metabolic panel to follow electrolytes and renal function. Reassess blood pressure and further adjust as required Make sure patient has follow-up with cardiology service as instructed Reassess blood sugar and further adjust hypoglycemic regimen as needed.    Discharge Diagnoses: Active Problems:   Gastro-esophageal reflux disease with esophagitis   Atherosclerotic heart disease of native coronary artery without angina pectoris   Chronic kidney disease, stage 3b (HCC)   Type 2 diabetes mellitus with diabetic nephropathy (HCC)   Chest pain   Elevated troponin   Tick bite of lower leg Hyponatremia Pressure injury (bilateral buttocks).  Brief Hospital admission course: As per H&P written by Dr. Randol Kern on 04/23/2023  [name] [name]  is a 82 y.o. female, Past medical history significant for CAD with prior CABG in 2021, COPD, hypertension, GERD, diabetes mellitus type 2, CKD 3B, depression. -Yesterday secondary to complaints of chest pain, she describes it as heaviness, reports symptoms began last Friday, she reports generalized weakness, fatigue, brain fog, she reports chest heaviness worsened with exertion, she denies any dyspnea, URI symptoms, congestion, he does as well reports several tick bites last Friday over her body's, which has been removed that same day.  She currently denies any chest pain while in ED. -In ED her EKG was nonacute, troponins non-ACS pattern 22>> 21, she had low-grade temperature 99.3, she is currently chest pain-free, [facility] hospitalist consulted to admit.  Assessment and Plan: Chest pain -Patient with prior history of coronary artery disease status post CABG in 2021 -Per cardiology reports has been experiencing  intermittently chest discomfort in the outpatient setting. -Flat elevation in her troponins, currently chest pain-free, no acute ischemic changes on EKG and reassuring echocardiogram. -Outpatient follow-up will be arranged by cardiology service with no further ischemic workup at the moment. -Patient is not a candidate for beta-blocker due to underlying conduction disorder. -Continue heart healthy diet. -Continue aspirin, statin and risk factor modification.  Tick bite -Final blood work results to rule out [facility] spotted fever and Lyme disease is pending at time of discharge. -Empirically patient will complete 12 more days of oral doxycycline. -Recommended to find exterminator for treatment for tick infestation in her house.  Gastroesophageal reflux disease -Continue the use of Pepcid.  Hyponatremia -Improved/resolved after fluid resuscitation -Patient advised to maintain adequate nutrition and hydration.  Dyslipidemia -Continue statin  Type 2 diabetes -Resume home hypoglycemic regimen -Continue modified carbohydrate diet.  Buttocks pressure injury -No signs of superimposed infection appreciated -Constant repositioning and preventive measures recommended.   Consultants: Cardiology service. Procedures performed: See below for x-ray reports. Disposition: Home Diet recommendation: Heart healthy modified carbohydrate diet.  DISCHARGE MEDICATION: Allergies as of 04/24/2023       Reactions   Iodinated Contrast Media Other (See Comments)   Unknown reaction. Due to kidney issues. Do not use Unknown reaction. Due to kidney issues. Do not use   Codeine Rash   Morphine And Related Other (See Comments), Rash   Unknown Reaction Unknown Reaction   Other    Pt has multiple allergies per pt but unsure of all.    Penicillins Other (See Comments)   Unknown reaction Unknown reaction        Medication List     STOP taking these medications  benzonatate 100 MG  capsule Commonly known as: TESSALON   prednisoLONE acetate 1 % ophthalmic suspension Commonly known as: PRED FORTE   promethazine-dextromethorphan 6.25-15 MG/5ML syrup Commonly known as: PROMETHAZINE-DM       TAKE these medications    Accu-Chek Softclix Lancets lancets Use as instructed   acetaminophen 325 MG tablet Commonly known as: TYLENOL Take 650 mg by mouth every 6 (six) hours as needed for moderate pain.   aspirin EC 81 MG tablet Take 1 tablet (81 mg total) by mouth daily. Swallow whole.   atorvastatin 40 MG tablet Commonly known as: LIPITOR TAKE 1 TABLET BY MOUTH AT BEDTIME.   blood glucose meter kit and supplies Dispense based on patient and insurance preference. Use up to four times daily as directed. (FOR ICD-10 E10.9, E11.9).   cetirizine 10 MG tablet Commonly known as: ZYRTEC Take 1 tablet (10 mg total) by mouth daily as needed (allergies).   doxycycline 100 MG tablet Commonly known as: VIBRA-TABS Take 1 tablet (100 mg total) by mouth 2 (two) times daily for 12 days. What changed: additional instructions   famotidine 20 MG tablet Commonly known as: PEPCID Take 1 tablet (20 mg total) by mouth daily. Start taking on: Apr 25, 2023   fish oil-omega-3 fatty acids 1000 MG capsule Take 1 g by mouth daily.   glucose blood test strip Use as instructed to monitor glucose twice daily   Accu-Chek Guide test strip Generic drug: glucose blood Use as instructed to monitor glucose twice daily   guaiFENesin-dextromethorphan 100-10 MG/5ML syrup Commonly known as: ROBITUSSIN DM Take 5 mLs by mouth every 4 (four) hours as needed for cough.   linagliptin 5 MG Tabs tablet Commonly known as: Tradjenta Take 1 tablet (5 mg total) by mouth daily.   metFORMIN 500 MG tablet Commonly known as: GLUCOPHAGE Take 1 tablet (500 mg total) by mouth daily with breakfast.   multivitamin with minerals Tabs tablet Take 1 tablet by mouth daily.   nitroGLYCERIN 0.4 MG SL  tablet Commonly known as: NITROSTAT Place 1 tablet (0.4 mg total) under the tongue every 5 (five) minutes x 3 doses as needed for chest pain (if no relief after 3rd dose, proceed to the ED or call 911).   polyethylene glycol 17 g packet Commonly known as: MIRALAX / GLYCOLAX Take 17 g by mouth daily as needed for mild constipation or moderate constipation.        Follow-up Information     Sharlene Dory, NP Follow up.   Specialty: Cardiology Why: Cardiology Hospital Follow-up on 05/15/2023 at 1:30 PM. Contact information: [address] [place] [address] [phone]         Billie Lade, MD. Schedule an appointment as soon as possible for a visit in 10 day(s).   Specialty: Internal Medicine Contact information: [address] [place] [address] [phone]                Discharge Exam: Ceasar Mons Weights   04/23/23 1413  Weight: 64.8 kg   General exam: Alert, awake, oriented x 3; reporting no chest pain, no nausea, no vomiting. Respiratory system: Clear to auscultation. Respiratory effort normal. Cardiovascular system:RRR. No rubs or gallops; no JVD. Gastrointestinal system: Abdomen is nondistended, soft and nontender. No organomegaly or masses felt. Normal bowel sounds heard. Central nervous system: Alert and oriented. No focal neurological deficits. Extremities: No C/C/E, +pedal pulses Skin: No petechiae; sun tick bite mark without active drainage or superimposed  infection appreciated.  Stage I/stage II bilateral buttocks pressure injury appreciated at time of admission.  No signs of infection seen. Psychiatry: Judgement and insight appear normal. Mood & affect appropriate.    Condition at discharge: Stable and improved.  The results of significant diagnostics from this hospitalization (including imaging, microbiology, ancillary and laboratory) are listed below for reference.   Imaging Studies: ECHOCARDIOGRAM COMPLETE  Result Date:  04/24/2023    ECHOCARDIOGRAM REPORT   Patient Name:   ADDILYNNE SIRIGNANO Date of Exam: 04/24/2023 Medical Rec #:  [id number]      Height:       59.0 in Accession #:    [id number]     Weight:       142.8 lb Date of Birth:  [birth year]     BSA:          1.598 m Patient Age:    81 years       BP:           116/65 mmHg Patient Gender: F              HR:           57 bpm. [facility] Procedure: 2D Echo, Cardiac Doppler, Color Doppler and 3D Echo Indications:    Chest Pain R07.9  History:        Patient has prior history of Echocardiogram examinations, most                 recent 07/02/2021. CAD, Signs/Symptoms:Chest Pain; Risk                 Factors:Diabetes, Non-Smoker and Hypertension.  Sonographer:    Aron Baba Referring Phys: 41 DAWOOD S Randol Kern  Sonographer Comments: Image acquisition challenging due to patient body habitus and Image acquisition challenging due to respiratory motion. IMPRESSIONS  1. Left ventricular ejection fraction, by estimation, is 65 to 70%. The left ventricle has normal function. The left ventricle has no regional wall motion abnormalities. There is mild left ventricular hypertrophy. Left ventricular diastolic parameters are indeterminate.  2. Right ventricular systolic function is normal. The right ventricular size is normal. There is normal pulmonary artery systolic pressure. The estimated right ventricular systolic pressure is 24.9 mmHg.  3. Left atrial size was upper normal.  4. The mitral valve is grossly normal. Mild mitral valve regurgitation.  5. The aortic valve is tricuspid. There is mild calcification of the aortic valve. Aortic valve regurgitation is not visualized. Aortic valve sclerosis/calcification is present, without any evidence of aortic stenosis.  6. The inferior vena cava is normal in size with greater than 50% respiratory variability, suggesting right atrial pressure of 3 mmHg. Comparison(s): Prior images reviewed side by side. LVEF vigorous at 65-70%.  FINDINGS  Left Ventricle: Left ventricular ejection fraction, by estimation, is 65 to 70%. The left ventricle has normal function. The left ventricle has no regional wall motion abnormalities. The left ventricular internal cavity size was normal in size. There is  mild left ventricular hypertrophy. Left ventricular diastolic parameters are indeterminate. Right Ventricle: The right ventricular size is normal. No increase in right ventricular wall thickness. Right ventricular systolic function is normal. There is normal pulmonary artery systolic pressure. The tricuspid regurgitant velocity is 2.34 m/s, and  with an assumed right atrial pressure of 3 mmHg, the estimated right ventricular systolic pressure is 24.9 mmHg. Left Atrium: Left atrial size was upper normal. Right Atrium: Right atrial size was normal in size. Pericardium: There is  no evidence of pericardial effusion. Mitral Valve: The mitral valve is grossly normal. Mild mitral annular calcification. Mild mitral valve regurgitation. Tricuspid Valve: The tricuspid valve is grossly normal. Tricuspid valve regurgitation is mild. Aortic Valve: The aortic valve is tricuspid. There is mild calcification of the aortic valve. Aortic valve regurgitation is not visualized. Aortic valve sclerosis/calcification is present, without any evidence of aortic stenosis. Pulmonic Valve: The pulmonic valve was grossly normal. Pulmonic valve regurgitation is trivial. Aorta: The aortic root is normal in size and structure. Venous: The inferior vena cava is normal in size with greater than 50% respiratory variability, suggesting right atrial pressure of 3 mmHg. IAS/Shunts: No atrial level shunt detected by color flow Doppler.  LEFT VENTRICLE PLAX 2D LVIDd:         4.60 cm   Diastology LVIDs:         2.80 cm   LV e' medial:    6.00 cm/s LV PW:         1.30 cm   LV E/e' medial:  13.3 LV IVS:        0.90 cm   LV e' lateral:   12.20 cm/s LVOT diam:     1.70 cm   LV E/e' lateral: 6.5 LV  SV:         46 LV SV Index:   29 LVOT Area:     2.27 cm                           3D Volume EF:                          3D EF:        58 %                          LV EDV:       136 ml                          LV ESV:       57 ml                          LV SV:        78 ml RIGHT VENTRICLE RV S prime:     10.90 cm/s TAPSE (M-mode): 1.2 cm LEFT ATRIUM             Index        RIGHT ATRIUM           Index LA diam:        3.30 cm 2.06 cm/m   RA Area:     17.90 cm LA Vol (A2C):   48.0 ml 30.03 ml/m  RA Volume:   46.80 ml  29.28 ml/m LA Vol (A4C):   50.3 ml 31.47 ml/m LA Biplane Vol: 53.4 ml 33.41 ml/m  AORTIC VALVE LVOT Vmax:   93.20 cm/s LVOT Vmean:  63.700 cm/s LVOT VTI:    0.203 m  AORTA Ao Root diam: 3.40 cm Ao Asc diam:  3.30 cm MITRAL VALVE               TRICUSPID VALVE MV Area (PHT): 2.60 cm    TR Peak grad:   21.9 mmHg MV Decel Time: 292 msec    TR Vmax:  234.00 cm/s MV E velocity: 79.50 cm/s MV A velocity: 73.70 cm/s  SHUNTS MV E/A ratio:  1.08        Systemic VTI:  0.20 m                            Systemic Diam: 1.70 cm Nona Dell MD Electronically signed by Nona Dell MD Signature Date/Time: 04/24/2023/11:02:03 AM    Final    DG Chest Port 1 View  Result Date: 04/23/2023 CLINICAL DATA:  Chest pain. EXAM: PORTABLE CHEST 1 VIEW COMPARISON:  04/15/2023. FINDINGS: No consolidation or pulmonary edema. Stable cardiac and mediastinal contours with postoperative changes of median sternotomy and CABG. No pleural effusion or pneumothorax. Visualized bones and upper abdomen are unremarkable. IMPRESSION: No evidence of acute cardiopulmonary disease. Electronically Signed   By: Orvan Falconer M.D.   On: 04/23/2023 15:02   DG Chest 2 View  Result Date: 04/16/2023 CLINICAL DATA:  Chest heaviness, shortness of breath, coarse crackles EXAM: CHEST - 2 VIEW COMPARISON:  09/25/2022 FINDINGS: Normal heart size post CABG. Mediastinal contours and pulmonary vascularity normal. Atherosclerotic  calcification aorta. Lungs hyperinflated but clear. No pulmonary infiltrate, pleural effusion, or pneumothorax. Scattered endplate spur formation thoracic spine. IMPRESSION: Hyperinflated lungs without acute infiltrate. Aortic Atherosclerosis (ICD10-I70.0). Electronically Signed   By: Ulyses Southward M.D.   On: 04/16/2023 15:32    Microbiology: Results for orders placed or performed during the hospital encounter of 04/23/23  SARS Coronavirus 2 by RT PCR (hospital order, performed in [facility] hospital lab) *cepheid single result test* Anterior Nasal Swab     Status: None   Collection Time: 04/23/23  5:39 PM   Specimen: Anterior Nasal Swab  Result Value Ref Range Status   SARS Coronavirus 2 by RT PCR NEGATIVE NEGATIVE Final    Comment: (NOTE) SARS-CoV-2 target nucleic acids are NOT DETECTED.  The SARS-CoV-2 RNA is generally detectable in upper and lower respiratory specimens during the acute phase of infection. The lowest concentration of SARS-CoV-2 viral copies this assay can detect is 250 copies / mL. A negative result does not preclude SARS-CoV-2 infection and should not be used as the sole basis for treatment or other patient management decisions.  A negative result may occur with improper specimen collection / handling, submission of specimen other than nasopharyngeal swab, presence of viral mutation(s) within the areas targeted by this assay, and inadequate number of viral copies (<250 copies / mL). A negative result must be combined with clinical observations, patient history, and epidemiological information.  Fact Sheet for Patients:   RoadLapTop.co.za  Fact Sheet for Healthcare Providers: http://kim-miller.com/  This test is not yet approved or  cleared by the [place] FDA and has been authorized for detection and/or diagnosis of SARS-CoV-2 by FDA under an Emergency Use Authorization (EUA).  This EUA will remain in effect  (meaning this test can be used) for the duration of the COVID-19 declaration under Section 564(b)(1) of the Act, 21 U.S.C. section 360bbb-3(b)(1), unless the authorization is terminated or revoked sooner.  Performed at [facility], [address] [place], [place] [address]     Labs: CBC: Recent Labs  Lab 04/23/23 1446 04/24/23 0407  WBC 2.5* 2.1*  NEUTROABS 1.4*  --   HGB 12.1 11.3*  HCT 35.5* 33.1*  MCV 93.7 93.8  PLT 109* 107*   Basic Metabolic Panel: Recent Labs  Lab 04/23/23 1446 04/24/23 0407  NA 128* 131*  K 4.9 4.3  CL 97* 102  CO2 21* 21*  GLUCOSE 127* 186*  BUN 27* 26*  CREATININE 1.41* 1.32*  CALCIUM 8.3* 8.1*   Liver Function Tests: Recent Labs  Lab 04/23/23 1446 04/24/23 0407  AST 38 35  ALT 29 24  ALKPHOS 46 41  BILITOT 0.6 0.5  PROT 6.8 6.0*  ALBUMIN 3.5 2.9*   CBG: Recent Labs  Lab 04/23/23 2033 04/24/23 0730 04/24/23 1114  GLUCAP 126* 159* 206*    Discharge time spent: greater than 30 minutes.  Signed: Vassie Loll, MD [facility] 04/24/2023

## 2023-04-24 NOTE — Telephone Encounter (Signed)
Pts daughter called in regards to missed call. Checked recent encounters did not see a recent one. Follow up.

## 2023-04-24 NOTE — Care Management Obs Status (Signed)
MEDICARE OBSERVATION STATUS NOTIFICATION   Patient Details  Name: [name] [name] MRN: [id number] Date of Birth: [birth year]   Medicare Observation Status Notification Given:  Yes    Corey Harold 04/24/2023, 1:54 PM

## 2023-04-24 NOTE — TOC Progression Note (Signed)
Transition of Care [facility]) - Progression Note    Patient Details  Name: [name] MRN: [id number] Date of Birth: Jun 08, 1941  Transition of [facility]) CM/SW Contact  Karn Cassis, [place] Phone Number: 04/24/2023, 1:47 PM  Clinical Narrative:  Transition of Care [facility]) Screening Note   Patient Details  Name: [name] [name] Date of Birth: Dec 20, 1940   Transition of Care [facility]) CM/SW Contact:    Karn Cassis, LCSW Phone Number: 04/24/2023, 1:47 PM    Transition of Care Department [facility]) has reviewed patient and no TOC needs have been identified at this time. We will continue to monitor patient advancement through interdisciplinary progression rounds. If new patient transition needs arise, please place a TOC consult.             Expected Discharge Plan and Services         Expected Discharge Date: 04/24/23                                     Social Determinants of Health (SDOH) Interventions SDOH Screenings   Food Insecurity: No Food Insecurity (04/23/2023)  Housing: Low Risk  (04/23/2023)  Transportation Needs: No Transportation Needs (04/23/2023)  Utilities: Not At Risk (04/23/2023)  Alcohol Screen: Low Risk  (01/19/2023)  Depression (PHQ2-9): Low Risk  (02/17/2023)  Financial Resource Strain: Low Risk  (01/19/2023)  Physical Activity: Insufficiently Active (01/19/2023)  Social Connections: Moderately Isolated (01/19/2023)  Stress: No Stress Concern Present (01/19/2023)  Tobacco Use: Low Risk  (04/23/2023)    Readmission Risk Interventions     No data to display

## 2023-04-24 NOTE — Progress Notes (Signed)
  Echocardiogram 2D Echocardiogram has been performed.  [name] [name] 04/24/2023, 9:30 AM

## 2023-04-26 LAB — LYME DISEASE SEROLOGY W/REFLEX: Lyme Total Antibody EIA: NEGATIVE

## 2023-05-04 ENCOUNTER — Other Ambulatory Visit: Payer: Medicare Other

## 2023-05-04 ENCOUNTER — Other Ambulatory Visit: Payer: Self-pay | Admitting: Cardiology

## 2023-05-04 ENCOUNTER — Other Ambulatory Visit: Payer: Self-pay | Admitting: Nurse Practitioner

## 2023-05-04 DIAGNOSIS — E1121 Type 2 diabetes mellitus with diabetic nephropathy: Secondary | ICD-10-CM

## 2023-05-04 LAB — SPOTTED FEVER GROUP ANTIBODIES
Spotted Fever Group IgG: <1:64 {titer}
Spotted Fever Group IgM: <1:64 {titer}

## 2023-05-15 ENCOUNTER — Ambulatory Visit: Payer: Medicare Other | Attending: Nurse Practitioner | Admitting: Nurse Practitioner

## 2023-05-15 ENCOUNTER — Encounter: Payer: Self-pay | Admitting: Nurse Practitioner

## 2023-05-15 VITALS — BP 140/78 | HR 52 | Ht 59.0 in | Wt 147.2 lb

## 2023-05-15 DIAGNOSIS — I25118 Atherosclerotic heart disease of native coronary artery with other forms of angina pectoris: Secondary | ICD-10-CM | POA: Diagnosis not present

## 2023-05-15 DIAGNOSIS — R001 Bradycardia, unspecified: Secondary | ICD-10-CM | POA: Diagnosis not present

## 2023-05-15 DIAGNOSIS — E871 Hypo-osmolality and hyponatremia: Secondary | ICD-10-CM | POA: Insufficient documentation

## 2023-05-15 DIAGNOSIS — I441 Atrioventricular block, second degree: Secondary | ICD-10-CM | POA: Insufficient documentation

## 2023-05-15 DIAGNOSIS — D61818 Other pancytopenia: Secondary | ICD-10-CM | POA: Insufficient documentation

## 2023-05-15 DIAGNOSIS — D649 Anemia, unspecified: Secondary | ICD-10-CM

## 2023-05-15 DIAGNOSIS — R7989 Other specified abnormal findings of blood chemistry: Secondary | ICD-10-CM

## 2023-05-15 DIAGNOSIS — Z79899 Other long term (current) drug therapy: Secondary | ICD-10-CM | POA: Insufficient documentation

## 2023-05-15 DIAGNOSIS — E785 Hyperlipidemia, unspecified: Secondary | ICD-10-CM | POA: Insufficient documentation

## 2023-05-15 DIAGNOSIS — N183 Chronic kidney disease, stage 3 unspecified: Secondary | ICD-10-CM

## 2023-05-15 DIAGNOSIS — I10 Essential (primary) hypertension: Secondary | ICD-10-CM | POA: Diagnosis not present

## 2023-05-15 NOTE — Progress Notes (Unsigned)
Office Visit    Patient Name: [name] [name] Date of Encounter: 05/15/2023  PCP:  Billie Lade, MD   [facility]  Cardiologist:  Nona Dell, MD  Advanced Practice Provider:  Sharlene Dory, NP Electrophysiologist:  None   Chief Complaint    FLORALEE [name] is a 82 y.o. female with a hx of CAD, s/p CABG in 2021, intermittent bradycardia with second degree type 1 and 2:1 AV block, HTN, T2DM, GERD, CKD, depression, anxiety, and OA, who presents today for scheduled follow-up.    Past Medical History    Past Medical History:  Diagnosis Date   Anxiety    CAD (coronary artery disease)    Multivessel status post CABG June 2021 - LIMA to LAD, SVG to distal RCA, SVG to OM1   Chronic obstructive pulmonary disease, unspecified (HCC)    Depression    Essential hypertension    GERD    History of pneumonia    Incarcerated ventral hernia    Osteoarthritis    Type 2 diabetes mellitus (HCC)    Past Surgical History:  Procedure Laterality Date   CATARACT EXTRACTION Bilateral 2023   CORONARY ARTERY BYPASS GRAFT N/A 05/22/2020   Procedure: CORONARY ARTERY BYPASS GRAFTING (CABG) x3 LIMA TO LAD, SVG TO OM1, SVG TO DISTAL RIGHT;  Surgeon: Corliss Skains, MD;  Location: [facility] OR;  Service: Open Heart Surgery;  Laterality: N/A;   LEFT HEART CATH AND CORONARY ANGIOGRAPHY N/A 05/21/2020   Procedure: LEFT HEART CATH AND CORONARY ANGIOGRAPHY;  Surgeon: [place]az[place], Peter M, MD;  Location: [facility] INVASIVE CV LAB;  Service: Cardiovascular;  Laterality: N/A;   NEPHRECTOMY     TEE WITHOUT CARDIOVERSION N/A 05/22/2020   Procedure: TRANSESOPHAGEAL ECHOCARDIOGRAM (TEE);  Surgeon: Corliss Skains, MD;  Location: [facility] OR;  Service: Open Heart Surgery;  Laterality: N/A;   VEIN HARVEST Right 05/22/2020   Procedure: Open Vein Harvest;  Surgeon: Corliss Skains, MD;  Location: [facility] OR;  Service: Open Heart Surgery;  Laterality: Right;   VENTRAL HERNIA REPAIR  09/01/2017    mistaken entry    Allergies  Allergies  Allergen Reactions   Iodinated Contrast Media Other (See Comments)    Unknown reaction. Due to kidney issues. Do not use Unknown reaction. Due to kidney issues. Do not use   Codeine Rash   Morphine And Codeine Other (See Comments) and Rash    Unknown Reaction Unknown Reaction   Other     Pt has multiple allergies per pt but unsure of all.    Penicillins Other (See Comments)    Unknown reaction Unknown reaction    History of Present Illness    [name] [name] is a 82 y.o. female with a PMH as mentioned above.   Was seen in ED 05/2022 for evaluation for bradycardia by Dr. Eden Emms. Was noted to have episodic second degree type 1 heart block with episodes of 2:1 AV block. Was not on any AV nodal blockers. Recommended for outpatient monitor.   Last seen by Dr. Diona Browner on 07/03/2022. Denied any syncope or dizziness. 7 day Zio monitor arranged and revealed predominately SR, with episodes of bradycardia, rare PAC's/PVC's, and rare and brief episodes of second-degree type 1 block, there was 3.2 second pause associated with dropped sinus beat, not triggered event. Was recommended based on findings did not need PPM at the time.   Last saw her for follow-up with her daughter on April 07, 2023.  Was doing well.  Denied any recent chest pain, but did admit to rare episodes of stinging chest pain related to episodes of stress, but denied any recent symptoms.  Denied any other associated symptom.  Admission to [facility] 04/23/23 - 04/24/23 for chest heaviness, worsening with exertion, denied any shortness of breath.  Reported several tick bites over her body, removed the same day.  Troponins negative for ACS.  EKG was nonacute.  Patient treated with doxycycline empirically.  Echocardiogram showed normal EF, mild LVH, overall benign.  Today she presents for follow-up.  Admits to chest heaviness with anxiety/stress.  She states she had an episode when traveling in  the car on the way to this office visit.  Was in some minor traffic, got stressed, and noted some chest heaviness. Denies any shortness of breath, palpitations, syncope, presyncope, dizziness, orthopnea, PND, swelling or significant weight changes, acute bleeding, or claudication.    EKGs/Labs/Other Studies Reviewed:   The following studies were reviewed today:   EKG:  EKG is not ordered today.    Echocardiogram 04/24/2023:  1. Left ventricular ejection fraction, by estimation, is 65 to 70%. The  left ventricle has normal function. The left ventricle has no regional  wall motion abnormalities. There is mild left ventricular hypertrophy.  Left ventricular diastolic parameters  are indeterminate.   2. Right ventricular systolic function is normal. The right ventricular  size is normal. There is normal pulmonary artery systolic pressure. The  estimated right ventricular systolic pressure is 24.9 mmHg.   3. Left atrial size was upper normal.   4. The mitral valve is grossly normal. Mild mitral valve regurgitation.   5. The aortic valve is tricuspid. There is mild calcification of the  aortic valve. Aortic valve regurgitation is not visualized. Aortic valve  sclerosis/calcification is present, without any evidence of aortic  stenosis.   6. The inferior vena cava is normal in size with greater than 50%  respiratory variability, suggesting right atrial pressure of 3 mmHg.   Comparison(s): Prior images reviewed side by side. LVEF vigorous at  65-70%.  Cardiac monitor 07/2022: Predominant rhythm is sinus with prolonged PR interval.  Heart rate ranged from 38 bpm up to 108 bpm with average heart rate 61 bpm. There were rare PACs representing less than 1% total beats. There were rare PVCs including ventricular couplets representing less than 1% total beats. Rare and brief episodes of second-degree type I heart block were noted.  There was a 3.2-second pause associated with this as well as a  dropped sinus beat.  This was not a patient triggered event.  Echo 06/2021: 1. Left ventricular ejection fraction, by estimation, is 55 to 60%. The  left ventricle has normal function. The left ventricle has no regional  wall motion abnormalities. There is mild left ventricular hypertrophy.  Left ventricular diastolic parameters  were normal.   2. Right ventricular systolic function is normal. The right ventricular  size is normal. There is normal pulmonary artery systolic pressure. The  estimated right ventricular systolic pressure is 16.8 mmHg.   3. The mitral valve is grossly normal. Trivial mitral valve  regurgitation.   4. The aortic valve is tricuspid. There is mild calcification of the  aortic valve. Aortic valve regurgitation is not visualized. Mild aortic  valve sclerosis is present, with no evidence of aortic valve stenosis.   5. The inferior vena cava is normal in size with greater than 50%  respiratory variability, suggesting right atrial pressure of 3 mmHg.  Recent Labs: 05/21/2022:  Magnesium 1.9; TSH 2.469 04/24/2023: ALT 24; BUN 26; Creatinine, Ser 1.32; Hemoglobin 11.3; Platelets 107; Potassium 4.3; Sodium 131  Recent Lipid Panel    Component Value Date/Time   CHOL 100 04/15/2023 1414   CHOL 121 11/21/2021 1115   TRIG 102 04/15/2023 1414   HDL 37 (L) 04/15/2023 1414   HDL 39 (L) 11/21/2021 1115   CHOLHDL 2.7 04/15/2023 1414   VLDL 20 04/15/2023 1414   LDLCALC 43 04/15/2023 1414   LDLCALC 55 11/21/2021 1115   LDLCALC 109 (H) 03/07/2020 1137    Home Medications   Current Meds  Medication Sig   Accu-Chek Softclix Lancets lancets Use as instructed   acetaminophen (TYLENOL) 325 MG tablet Take 650 mg by mouth every 6 (six) hours as needed for moderate pain.   aspirin EC 81 MG tablet Take 1 tablet (81 mg total) by mouth daily. Swallow whole.   atorvastatin (LIPITOR) 40 MG tablet TAKE 1 TABLET BY MOUTH AT BEDTIME.   blood glucose meter kit and supplies Dispense based  on patient and insurance preference. Use up to four times daily as directed. (FOR ICD-10 E10.9, E11.9).   cetirizine (ZYRTEC) 10 MG tablet Take 1 tablet (10 mg total) by mouth daily as needed (allergies).   famotidine (PEPCID) 20 MG tablet Take 1 tablet (20 mg total) by mouth daily.   fish oil-omega-3 fatty acids 1000 MG capsule Take 1 g by mouth daily.   glucose blood (ACCU-CHEK GUIDE) test strip Use as instructed to monitor glucose twice daily   glucose blood test strip Use as instructed to monitor glucose twice daily   guaiFENesin-dextromethorphan (ROBITUSSIN DM) 100-10 MG/5ML syrup Take 5 mLs by mouth every 4 (four) hours as needed for cough.   linagliptin (TRADJENTA) 5 MG TABS tablet Take 1 tablet by mouth once daily   metFORMIN (GLUCOPHAGE) 500 MG tablet TAKE 1 TABLET ONCE DAILY WITH BREAKFAST.   Multiple Vitamin (MULITIVITAMIN WITH MINERALS) TABS Take 1 tablet by mouth daily.   nitroGLYCERIN (NITROSTAT) 0.4 MG SL tablet Place 1 tablet (0.4 mg total) under the tongue every 5 (five) minutes x 3 doses as needed for chest pain (if no relief after 3rd dose, proceed to the ED or call 911).   polyethylene glycol (MIRALAX / GLYCOLAX) 17 g packet Take 17 g by mouth daily as needed for mild constipation or moderate constipation.   Review of Systems    All other systems reviewed and are otherwise negative except as noted above.  Physical Exam    VS:  BP (!) 140/78   Pulse (!) 52   Ht 4\' 11"  (1.499 m)   Wt 147 lb 3.2 oz (66.8 kg)   SpO2 94%   BMI 29.73 kg/m  , BMI Body mass index is 29.73 kg/m.  Wt Readings from Last 3 Encounters:  05/15/23 147 lb 3.2 oz (66.8 kg)  04/23/23 142 lb 12.8 oz (64.8 kg)  04/07/23 148 lb 9.6 oz (67.4 kg)     GEN: Well nourished, well developed, in no acute distress. HEENT: normal. Neck: Supple, no JVD, carotid bruits, or masses. Cardiac: S1/S2, RRR, Grade 2/6 murmur, no rubs, no gallops. No clubbing, cyanosis, edema.  Radials/PT 2+ and equal bilaterally.   Respiratory:  Respirations regular and unlabored, clear and diminished to auscultation bilaterally. GI: significantly sized hernia located along LLE, has been present since 1975 per patient's report. MS: No deformity or atrophy. Skin: Warm and dry, no rash. Neuro:  Strength and sensation are intact. Psych: Normal affect, talkative,  distracted.  Assessment & Plan    CAD, s/p CABG in 2021, HLD Admits to atypical chest pain during periods of stress, has not had for a long time. Had recent workup in ED that was negative for ACS.  Etiology related to anxiety/stress, noncardiac in origin.  No indication for ischemic evaluation at this time. She is due for a lipid panel to be drawn. Continue current medication regimen. Heart healthy diet and regular cardiovascular exercise encouraged. ED precautions discussed.    Intermittent bradycardia with second degree type 1 and 2:1 AV block, bradycardia Previous monitor revealed predominant sinus rhythm with periods of bradycardia, few episodes of brief heart block, longest pause was 3.2 seconds not associated with triggered event.  Dr. Diona Browner stated there were no findings that would suggest she needed a pacemaker at the time.  Denies any symptoms. Not on any AV nodal blockers. Will continue to monitor. ED precautions discussed.   HTN Blood pressure overall well controlled at home. Discussed to monitor BP at home at least 2 hours after medications and sitting for 5-10 minutes.  Continue current medication regimen. Heart healthy diet and regular cardiovascular exercise encouraged.   CKD 3a - 3b, medication management Most recent serum creatinine 1.32 with eGFR 41.  Baseline serum creatinine is around 1.18-1.38.  Will obtain BMET at this time. Avoid nephrotoxic agents.  No medication changes at this time.  Encouraged adequate hydration. Continue to follow with PCP.  5. Hyponatremia Most recent labs revealed Na level 131, previous normal results 6 months - 1  year ago. Denies any red flag symptoms. Will obtain BMET for evaluation. Continue to follow with PCP. PCP to manage.   6. Pancytopenia Most recent labs revealed white blood count at 2.1, red blood cell count at 3.53, hemoglobin at 11.3, and platelet count at 107. Denies any red flag symptoms at this time. Will repeat CBC at this time.  If repeat labs stable or worse, plan to refer to hematology for evaluation. Continue to follow with PCP.  Disposition: Follow up in 6 month(s) with Nona Dell, MD or APP.  Signed, Sharlene Dory, NP 05/17/2023, 7:21 PM [facility]

## 2023-05-15 NOTE — Patient Instructions (Addendum)
Medication Instructions:  Your physician recommends that you continue on your current medications as directed. Please refer to the Current Medication list given to you today.   Labwork: CBC and BMET in 2-3 weeks at [facility] Non-fasting  Testing/Procedures: None  Follow-Up: Your physician recommends that you schedule a follow-up appointment in: 6 months  Any Other Special Instructions Will Be Listed Below (If Applicable).  If you need a refill on your cardiac medications before your next appointment, please call your pharmacy.

## 2023-05-20 ENCOUNTER — Ambulatory Visit: Payer: Medicare Other | Admitting: Internal Medicine

## 2023-06-01 DIAGNOSIS — E1151 Type 2 diabetes mellitus with diabetic peripheral angiopathy without gangrene: Secondary | ICD-10-CM | POA: Diagnosis not present

## 2023-06-01 DIAGNOSIS — E1140 Type 2 diabetes mellitus with diabetic neuropathy, unspecified: Secondary | ICD-10-CM | POA: Diagnosis not present

## 2023-06-02 ENCOUNTER — Other Ambulatory Visit (HOSPITAL_COMMUNITY)
Admission: RE | Admit: 2023-06-02 | Discharge: 2023-06-02 | Disposition: A | Discharge status: Home / Self Care | Payer: Medicare Other | Source: Ambulatory Visit | Attending: Nurse Practitioner | Admitting: Nurse Practitioner

## 2023-06-02 DIAGNOSIS — Z79899 Other long term (current) drug therapy: Secondary | ICD-10-CM | POA: Diagnosis not present

## 2023-06-02 DIAGNOSIS — D61818 Other pancytopenia: Secondary | ICD-10-CM | POA: Diagnosis not present

## 2023-06-02 DIAGNOSIS — N183 Chronic kidney disease, stage 3 unspecified: Secondary | ICD-10-CM | POA: Insufficient documentation

## 2023-06-02 LAB — CBC
HCT: 35.7 % — ABNORMAL LOW (ref 36.0–46.0)
Hemoglobin: 12 g/dL (ref 12.0–15.0)
MCH: 31.9 pg (ref 26.0–34.0)
MCHC: 33.6 g/dL (ref 30.0–36.0)
MCV: 94.9 fL (ref 80.0–100.0)
Platelets: 227 10*3/uL (ref 150–400)
RBC: 3.76 MIL/uL — ABNORMAL LOW (ref 3.87–5.11)
RDW: 13 % (ref 11.5–15.5)
WBC: 9 10*3/uL (ref 4.0–10.5)
nRBC: 0 % (ref 0.0–0.2)

## 2023-06-02 LAB — BASIC METABOLIC PANEL
Anion gap: 9 (ref 5–15)
BUN: 23 mg/dL (ref 8–23)
CO2: 22 mmol/L (ref 22–32)
Calcium: 8.8 mg/dL — ABNORMAL LOW (ref 8.9–10.3)
Chloride: 105 mmol/L (ref 98–111)
Creatinine, Ser: 1.29 mg/dL — ABNORMAL HIGH (ref 0.44–1.00)
GFR, Estimated: 42 mL/min — ABNORMAL LOW (ref >60)
Glucose, Bld: 150 mg/dL — ABNORMAL HIGH (ref 70–99)
Potassium: 4.5 mmol/L (ref 3.5–5.1)
Sodium: 136 mmol/L (ref 135–145)

## 2023-06-04 ENCOUNTER — Ambulatory Visit (INDEPENDENT_AMBULATORY_CARE_PROVIDER_SITE_OTHER): Payer: Medicare Other | Admitting: Nurse Practitioner

## 2023-06-04 ENCOUNTER — Encounter: Payer: Self-pay | Admitting: Nurse Practitioner

## 2023-06-04 VITALS — BP 138/77 | HR 77 | Ht 59.0 in | Wt 144.6 lb

## 2023-06-04 DIAGNOSIS — E559 Vitamin D deficiency, unspecified: Secondary | ICD-10-CM

## 2023-06-04 DIAGNOSIS — Z7984 Long term (current) use of oral hypoglycemic drugs: Secondary | ICD-10-CM

## 2023-06-04 DIAGNOSIS — E1121 Type 2 diabetes mellitus with diabetic nephropathy: Secondary | ICD-10-CM | POA: Diagnosis not present

## 2023-06-04 DIAGNOSIS — E782 Mixed hyperlipidemia: Secondary | ICD-10-CM

## 2023-06-04 DIAGNOSIS — I10 Essential (primary) hypertension: Secondary | ICD-10-CM | POA: Diagnosis not present

## 2023-06-04 DIAGNOSIS — Z905 Acquired absence of kidney: Secondary | ICD-10-CM

## 2023-06-04 LAB — POCT GLYCOSYLATED HEMOGLOBIN (HGB A1C): Hemoglobin A1C: 6.9 % — AB (ref 4.0–5.6)

## 2023-06-04 MED ORDER — METFORMIN HCL 500 MG PO TABS
500.0000 mg | ORAL_TABLET | Freq: Every day | ORAL | 1 refills | Status: DC
Start: 2023-06-04 — End: 2023-10-12

## 2023-06-04 MED ORDER — LINAGLIPTIN 5 MG PO TABS: 5.0000 mg | ORAL_TABLET | Freq: Every day | ORAL | 1 refills | Status: DC

## 2023-06-04 NOTE — Progress Notes (Signed)
Endocrinology Follow Up Note       06/04/2023, 2:08 PM   Subjective:    Patient ID: [name] [name], female    DOB: 05/28/1941.  [name] is being seen in follow up after being seen in consultation for management of currently uncontrolled symptomatic diabetes requested by  Billie Lade, MD.   Past Medical History:  Diagnosis Date   Anxiety    CAD (coronary artery disease)    Multivessel status post CABG June 2021 - LIMA to LAD, SVG to distal RCA, SVG to OM1   Chronic obstructive pulmonary disease, unspecified (HCC)    Depression    Essential hypertension    GERD    History of pneumonia    Incarcerated ventral hernia    Osteoarthritis    Type 2 diabetes mellitus (HCC)     Past Surgical History:  Procedure Laterality Date   CATARACT EXTRACTION Bilateral 2023   CORONARY ARTERY BYPASS GRAFT N/A 05/22/2020   Procedure: CORONARY ARTERY BYPASS GRAFTING (CABG) x3 LIMA TO LAD, SVG TO OM1, SVG TO DISTAL RIGHT;  Surgeon: Corliss Skains, MD;  Location: [facility] OR;  Service: Open Heart Surgery;  Laterality: N/A;   LEFT HEART CATH AND CORONARY ANGIOGRAPHY N/A 05/21/2020   Procedure: LEFT HEART CATH AND CORONARY ANGIOGRAPHY;  Surgeon: [place], Peter M, MD;  Location: [facility] INVASIVE CV LAB;  Service: Cardiovascular;  Laterality: N/A;   NEPHRECTOMY     TEE WITHOUT CARDIOVERSION N/A 05/22/2020   Procedure: TRANSESOPHAGEAL ECHOCARDIOGRAM (TEE);  Surgeon: Corliss Skains, MD;  Location: [facility] OR;  Service: Open Heart Surgery;  Laterality: N/A;   VEIN HARVEST Right 05/22/2020   Procedure: Open Vein Harvest;  Surgeon: Corliss Skains, MD;  Location: [facility] OR;  Service: Open Heart Surgery;  Laterality: Right;   VENTRAL HERNIA REPAIR  09/01/2017   mistaken entry    Social History   Socioeconomic History   Marital status: Widowed    Spouse name: Not on file   Number of children: 1   Years of education: 21    Highest education level: 12th grade  Occupational History   Occupation: Retired  Tobacco Use   Smoking status: Never    Passive exposure: Never   Smokeless tobacco: Never  Vaping Use   Vaping Use: Never used  Substance and Sexual Activity   Alcohol use: No   Drug use: No   Sexual activity: Not Currently  Other Topics Concern   Not on file  Social History Narrative   Lives alone.  Has 1 daughter      Enjoys reading and cooking      Eats all food groups   Caffeine, some coffee   Water reports drinking pretty well throughout the day      Social Determinants of Health   Financial Resource Strain: Low Risk  (01/19/2023)   Overall Financial Resource Strain (CARDIA)    Difficulty of Paying Living Expenses: Not hard at all  Food Insecurity: No Food Insecurity (04/23/2023)   Hunger Vital Sign    Worried About Running Out of Food in the Last Year: Never true    Ran Out of Food in the Last Year: Never true  Transportation Needs: No Transportation Needs (04/23/2023)   PRAPARE - Administrator, Civil Service (Medical): No    Lack of Transportation (Non-Medical): No  Physical Activity: Insufficiently Active (01/19/2023)   Exercise Vital Sign    Days of Exercise per Week: 5 days    Minutes of Exercise per Session: 20 min  Stress: No Stress Concern Present (01/19/2023)   [employer] of Occupational Health - Occupational Stress Questionnaire    Feeling of Stress : Not at all  Social Connections: Moderately Isolated (01/19/2023)   Social Connection and Isolation Panel [NHANES]    Frequency of Communication with Friends and Family: More than three times a week    Frequency of Social Gatherings with Friends and Family: More than three times a week    Attends Religious Services: More than 4 times per year    Active Member of [employer] or Organizations: No    Attends Banker Meetings: Never    Marital Status: Widowed    Family History  Problem Relation Age of Onset    CAD Father    Heart failure Sister    CAD Daughter    Heart disease Daughter     Outpatient Encounter Medications as of 06/04/2023  Medication Sig   Accu-Chek Softclix Lancets lancets Use as instructed   acetaminophen (TYLENOL) 325 MG tablet Take 650 mg by mouth every 6 (six) hours as needed for moderate pain.   aspirin EC 81 MG tablet Take 1 tablet (81 mg total) by mouth daily. Swallow whole.   atorvastatin (LIPITOR) 40 MG tablet TAKE 1 TABLET BY MOUTH AT BEDTIME.   blood glucose meter kit and supplies Dispense based on patient and insurance preference. Use up to four times daily as directed. (FOR ICD-10 E10.9, E11.9).   cetirizine (ZYRTEC) 10 MG tablet Take 1 tablet (10 mg total) by mouth daily as needed (allergies).   famotidine (PEPCID) 20 MG tablet Take 1 tablet (20 mg total) by mouth daily.   fish oil-omega-3 fatty acids 1000 MG capsule Take 1 g by mouth daily.   glucose blood (ACCU-CHEK GUIDE) test strip Use as instructed to monitor glucose twice daily   glucose blood test strip Use as instructed to monitor glucose twice daily   guaiFENesin-dextromethorphan (ROBITUSSIN DM) 100-10 MG/5ML syrup Take 5 mLs by mouth every 4 (four) hours as needed for cough.   Multiple Vitamin (MULITIVITAMIN WITH MINERALS) TABS Take 1 tablet by mouth daily.   nitroGLYCERIN (NITROSTAT) 0.4 MG SL tablet Place 1 tablet (0.4 mg total) under the tongue every 5 (five) minutes x 3 doses as needed for chest pain (if no relief after 3rd dose, proceed to the ED or call 911).   polyethylene glycol (MIRALAX / GLYCOLAX) 17 g packet Take 17 g by mouth daily as needed for mild constipation or moderate constipation.   [DISCONTINUED] linagliptin (TRADJENTA) 5 MG TABS tablet Take 1 tablet by mouth once daily   [DISCONTINUED] metFORMIN (GLUCOPHAGE) 500 MG tablet TAKE 1 TABLET ONCE DAILY WITH BREAKFAST.   linagliptin (TRADJENTA) 5 MG TABS tablet Take 1 tablet (5 mg total) by mouth daily.   metFORMIN (GLUCOPHAGE) 500 MG tablet  Take 1 tablet (500 mg total) by mouth daily with breakfast.   No facility-administered encounter medications on file as of 06/04/2023.    ALLERGIES: Allergies  Allergen Reactions   Iodinated Contrast Media Other (See Comments)    Unknown reaction. Due to kidney issues. Do not use Unknown reaction. Due to kidney issues. Do not  use   Codeine Rash   Morphine And Codeine Other (See Comments) and Rash    Unknown Reaction Unknown Reaction   Other     Pt has multiple allergies per pt but unsure of all.    Penicillins Other (See Comments)    Unknown reaction Unknown reaction    VACCINATION STATUS: Immunization History  Administered Date(s) Administered   Influenza, High Dose Seasonal PF 11/12/2017, 08/13/2018   Influenza-Unspecified 11/12/2011, 11/03/2012, 11/02/2013, 09/20/2014, 09/19/2015, 09/23/2016, 08/18/2019   Pneumococcal Conjugate-13 09/20/2014   Pneumococcal Polysaccharide-23 03/18/2016    Diabetes She presents for her follow-up diabetic visit. She has type 2 diabetes mellitus. Onset time: Was diagnosed at approx age of 66. Her disease course has been improving. There are no hypoglycemic associated symptoms. Associated symptoms include blurred vision and foot paresthesias. Pertinent negatives for diabetes include no fatigue, no polydipsia and no polyuria. There are no hypoglycemic complications. Symptoms are stable. Diabetic complications include heart disease, nephropathy and peripheral neuropathy. Risk factors for coronary artery disease include diabetes mellitus, dyslipidemia, hypertension, stress, tobacco exposure, sedentary lifestyle and post-menopausal. Current diabetic treatment includes oral agent (dual therapy). She is compliant with treatment most of the time. Her weight is decreasing steadily. She is following a generally healthy diet. When asked about meal planning, she reported none. She has not had a previous visit with a dietitian. She rarely participates in exercise.  Her home blood glucose trend is fluctuating minimally. Her breakfast blood glucose range is generally 140-180 mg/dl. (She presents today with her meter and logs showing mostly at goal fasting glycemic profile.  Her POCT A1c today is 6.9%, improving from last visit of 7.1%.  Analysis of her meter shows glucose ranging from 143-215.  She notes she cheats on her diet every once in a while but has been exercising more.) An ACE inhibitor/angiotensin II receptor blocker is not being taken. She does not see a podiatrist.Eye exam is current.  Hyperlipidemia This is a chronic problem. The current episode started more than 1 year ago. The problem is uncontrolled. Recent lipid tests were reviewed and are variable. Exacerbating diseases include chronic renal disease and diabetes. Factors aggravating her hyperlipidemia include fatty foods and beta blockers. Current antihyperlipidemic treatment includes statins. The current treatment provides mild improvement of lipids. Compliance problems include adherence to diet and adherence to exercise.  Risk factors for coronary artery disease include diabetes mellitus, dyslipidemia, hypertension, post-menopausal, a sedentary lifestyle and stress.  Hypertension This is a chronic problem. The current episode started more than 1 year ago. The problem has been waxing and waning since onset. The problem is controlled. Associated symptoms include blurred vision. There are no associated agents to hypertension. Risk factors for coronary artery disease include diabetes mellitus, dyslipidemia, sedentary lifestyle, smoking/tobacco exposure, stress and post-menopausal state. Past treatments include beta blockers. The current treatment provides mild improvement. There are no compliance problems.  Hypertensive end-organ damage includes kidney disease and CAD/MI. Identifiable causes of hypertension include chronic renal disease.    Review of systems  Constitutional: + decreasing body weight,   current Body mass index is 29.21 kg/m. , no fatigue, no subjective hyperthermia, no subjective hypothermia Eyes: no blurry vision, no xerophthalmia ENT: no sore throat, no nodules palpated in throat, no dysphagia/odynophagia, no hoarseness Cardiovascular: no chest pain, no shortness of breath, no palpitations, no leg swelling Respiratory: no cough, no shortness of breath Gastrointestinal: no nausea/vomiting/diarrhea Musculoskeletal: no muscle/joint aches Skin: no rashes, no hyperemia Neurological: no tremors, no numbness, no tingling, no dizziness  Psychiatric: no depression, no anxiety  Objective:     BP 138/77 (BP Location: Left Arm, Patient Position: Sitting, Cuff Size: Large)   Pulse 77   Ht 4\' 11"  (1.499 m)   Wt 144 lb 9.6 oz (65.6 kg)   BMI 29.21 kg/m   Wt Readings from Last 3 Encounters:  06/04/23 144 lb 9.6 oz (65.6 kg)  05/15/23 147 lb 3.2 oz (66.8 kg)  04/23/23 142 lb 12.8 oz (64.8 kg)     BP Readings from Last 3 Encounters:  06/04/23 138/77  05/15/23 (!) 140/78  04/24/23 (!) 128/49      Physical Exam- Limited  Constitutional:  Body mass index is 29.21 kg/m. , not in acute distress, anxious state of mind Eyes:  EOMI, no exophthalmos Musculoskeletal: she does have abnormal bulge in LLQ of abdomen (? hernia), strength intact in all four extremities, no gross restriction of joint movements Skin:  no rashes, no hyperemia Neurological: no tremor with outstretched hands   CMP ( most recent) CMP     Component Value Date/Time   NA 136 06/02/2023 1216   NA 141 11/21/2021 1115   K 4.5 06/02/2023 1216   CL 105 06/02/2023 1216   CO2 22 06/02/2023 1216   GLUCOSE 150 (H) 06/02/2023 1216   BUN 23 06/02/2023 1216   BUN 27 11/21/2021 1115   CREATININE 1.29 (H) 06/02/2023 1216   CREATININE 1.28 (H) 03/07/2020 1137   CALCIUM 8.8 (L) 06/02/2023 1216   PROT 6.0 (L) 04/24/2023 0407   PROT 6.8 11/21/2021 1115   ALBUMIN 2.9 (L) 04/24/2023 0407   ALBUMIN 4.5 11/21/2021  1115   AST 35 04/24/2023 0407   ALT 24 04/24/2023 0407   ALKPHOS 41 04/24/2023 0407   BILITOT 0.5 04/24/2023 0407   BILITOT 0.3 11/21/2021 1115   GFRNONAA 42 (L) 06/02/2023 1216   GFRNONAA 40 (L) 03/07/2020 1137   GFRAA 48 (L) 12/28/2020 0955   GFRAA 46 (L) 03/07/2020 1137     Diabetic Labs (most recent): Lab Results  Component Value Date   HGBA1C 6.9 (A) 06/04/2023   HGBA1C 7.1 (A) 02/03/2023   HGBA1C 5.6 09/09/2022   MICROALBUR 30 02/27/2021   MICROALBUR 2.4 03/07/2020   MICROALBUR 0.9 08/13/2018     Lipid Panel ( most recent) Lipid Panel     Component Value Date/Time   CHOL 100 04/15/2023 1414   CHOL 121 11/21/2021 1115   TRIG 102 04/15/2023 1414   HDL 37 (L) 04/15/2023 1414   HDL 39 (L) 11/21/2021 1115   CHOLHDL 2.7 04/15/2023 1414   VLDL 20 04/15/2023 1414   LDLCALC 43 04/15/2023 1414   LDLCALC 55 11/21/2021 1115   LDLCALC 109 (H) 03/07/2020 1137   LABVLDL 27 11/21/2021 1115      Lab Results  Component Value Date   TSH 2.469 05/21/2022   TSH 3.040 08/22/2021   TSH 2.02 11/17/2019   FREET4 1.25 08/22/2021           Assessment & Plan:   1) Controlled Type 2 Diabetes with CKD stage 3  - Amado Coe Cashaw has currently uncontrolled symptomatic type 2 DM since 82 years of age.  She presents today with her meter and logs showing mostly at goal fasting glycemic profile.  Her POCT A1c today is 6.9%, improving from last visit of 7.1%.  Analysis of her meter shows glucose ranging from 143-215.  She notes she cheats on her diet every once in a while but has been exercising more.  -  Recent labs reviewed.  - I had a long discussion with her about the progressive nature of diabetes and the pathology behind its complications. -her diabetes is complicated by CAD and she remains at a high risk for more acute and chronic complications which include CAD, CVA, CKD, retinopathy, and neuropathy. These are all discussed in detail with her.  - Nutritional counseling  repeated at each appointment due to patients tendency to fall back in to old habits.  - The patient admits there is a room for improvement in their diet and drink choices. -  Suggestion is made for the patient to avoid simple carbohydrates from their diet including Cakes, Sweet Desserts / Pastries, Ice Cream, Soda (diet and regular), Sweet Tea, Candies, Chips, Cookies, Sweet Pastries, Store Bought Juices, Alcohol in Excess of 1-2 drinks a day, Artificial Sweeteners, Coffee Creamer, and "Sugar-free" Products. This will help patient to have stable blood glucose profile and potentially avoid unintended weight gain.   - I encouraged the patient to switch to unprocessed or minimally processed complex starch and increased protein intake (animal or plant source), fruits, and vegetables.   - Patient is advised to stick to a routine mealtimes to eat 3 meals a day and avoid unnecessary snacks (to snack only to correct hypoglycemia).  - she will be scheduled with Norm Salt, RDN, CDE for diabetes education.  - I have approached her with the following individualized plan to manage  her diabetes and patient agrees:   Given her stable, at goal glycemic profile, she can continue her current medication regimen of Metformin 500 mg po once daily with breakfast, Tradjenta 5 mg po daily with breakfast.    -she is encouraged to continue monitoring blood glucose at least once daily, before breakfast, and to call the clinic if she has readings less than 70 or above 200 for 3 tests in a row.  - Specific targets for  A1c;  LDL, HDL,  and Triglycerides were discussed with the patient.  2) Blood Pressure /Hypertension:  her blood pressure is controlled to target for her age.   she is not currently on any antihypertensive medications at this time.  3) Lipids/Hyperlipidemia:    Review of her recent lipid panel from 04/15/23 showed controlled  LDL at 43.  she  is advised to continue Lipitor 40 mg daily at bedtime and  Fish Oil supplement.  Side effects and precautions discussed with her.    4)  Weight/Diet:  her Body mass index is 29.21 kg/m.  -  clearly complicating her diabetes care.   she is a candidate for weight loss. I discussed with her the fact that loss of 5 - 10% of her  current body weight will have the most impact on her diabetes management.  Exercise, and detailed carbohydrates information provided  -  detailed on discharge instructions.  5) Hypervitaminosis of vitamin D Her most recent vitamin D level on 08/22/21 was high at 113.  She was taking several supplements that contained vitamin D in it.  The nephrologist asked her to stop all vitamin D supplements.  6) Chronic Care/Health Maintenance: -she is on Statin medications and is encouraged to initiate and continue to follow up with Ophthalmology, Dentist, Podiatrist at least yearly or according to recommendations, and advised to stay away from smoking. I have recommended yearly flu vaccine and pneumonia vaccine at least every 5 years; moderate intensity exercise for up to 150 minutes weekly; and sleep for at least 7 hours a day.  -  she is advised to maintain close follow up with Durwin Nora Lucina Mellow, MD for primary care needs, as well as her other providers for optimal and coordinated care.     I spent  38  minutes in the care of the patient today including review of labs from CMP, Lipids, Thyroid Function, Hematology (current and previous including abstractions from other facilities); face-to-face time discussing  her blood glucose readings/logs, discussing hypoglycemia and hyperglycemia episodes and symptoms, medications doses, her options of short and long term treatment based on the latest standards of care / guidelines;  discussion about incorporating lifestyle medicine;  and documenting the encounter. Risk reduction counseling performed per USPSTF guidelines to reduce obesity and cardiovascular risk factors.     Please refer to Patient  Instructions for Blood Glucose Monitoring and Insulin/Medications Dosing Guide"  in media tab for additional information. Please  also refer to " Patient Self Inventory" in the Media  tab for reviewed elements of pertinent patient history.  Amado Coe Reily participated in the discussions, expressed understanding, and voiced agreement with the above plans.  All questions were answered to her satisfaction. she is encouraged to contact clinic should she have any questions or concerns prior to her return visit.   Follow up plan: - Return in about 4 months (around 10/04/2023) for Diabetes F/U with A1c in office, No previsit labs, Bring meter and logs.  Ronny Bacon, [facility] [address] [place], [place] [address] Phone: [phone] Fax: [phone]  06/04/2023, 2:08 PM

## 2023-06-08 ENCOUNTER — Ambulatory Visit (INDEPENDENT_AMBULATORY_CARE_PROVIDER_SITE_OTHER): Payer: Medicare Other | Admitting: Internal Medicine

## 2023-06-08 ENCOUNTER — Encounter: Payer: Self-pay | Admitting: Internal Medicine

## 2023-06-08 VITALS — BP 138/58 | HR 76 | Ht 59.0 in | Wt 144.4 lb

## 2023-06-08 DIAGNOSIS — I10 Essential (primary) hypertension: Secondary | ICD-10-CM | POA: Diagnosis not present

## 2023-06-08 DIAGNOSIS — I2510 Atherosclerotic heart disease of native coronary artery without angina pectoris: Secondary | ICD-10-CM | POA: Insufficient documentation

## 2023-06-08 DIAGNOSIS — E1121 Type 2 diabetes mellitus with diabetic nephropathy: Secondary | ICD-10-CM | POA: Diagnosis not present

## 2023-06-08 DIAGNOSIS — I25119 Atherosclerotic heart disease of native coronary artery with unspecified angina pectoris: Secondary | ICD-10-CM | POA: Diagnosis not present

## 2023-06-08 DIAGNOSIS — N1832 Chronic kidney disease, stage 3b: Secondary | ICD-10-CM

## 2023-06-08 NOTE — Progress Notes (Signed)
Established Patient Office Visit  Subjective   Patient ID: [name] [name], female    DOB: 17-Nov-1941  Age: 82 y.o. MRN: [id number]  Chief Complaint  Patient presents with   Follow-up   [name] [name] returns to care today for routine follow-up.  Last evaluated by me on 10/31/22 through acute virtual encounter in the setting of pneumonia and COVID-19.  In the interim she has been evaluated by cardiology and endocrinology.  Brief hospital admission 5/9 - 5/10 for atypical chest pain.  She has also been seen by nephrology for follow-up. [name] [name] reports feeling well today.  She is asymptomatic and has no acute concerns to discuss.  Past Medical History:  Diagnosis Date   Anxiety    CAD (coronary artery disease)    Multivessel status post CABG June 2021 - LIMA to LAD, SVG to distal RCA, SVG to OM1   Chronic obstructive pulmonary disease, unspecified (HCC)    Depression    Essential hypertension    GERD    History of pneumonia    Incarcerated ventral hernia    Osteoarthritis    Type 2 diabetes mellitus (HCC)    Past Surgical History:  Procedure Laterality Date   CATARACT EXTRACTION Bilateral 2023   CORONARY ARTERY BYPASS GRAFT N/A 05/22/2020   Procedure: CORONARY ARTERY BYPASS GRAFTING (CABG) x3 LIMA TO LAD, SVG TO OM1, SVG TO DISTAL RIGHT;  Surgeon: Corliss Skains, MD;  Location: [facility] OR;  Service: Open Heart Surgery;  Laterality: N/A;   LEFT HEART CATH AND CORONARY ANGIOGRAPHY N/A 05/21/2020   Procedure: LEFT HEART CATH AND CORONARY ANGIOGRAPHY;  Surgeon: [place], Peter M, MD;  Location: [facility] INVASIVE CV LAB;  Service: Cardiovascular;  Laterality: N/A;   NEPHRECTOMY     TEE WITHOUT CARDIOVERSION N/A 05/22/2020   Procedure: TRANSESOPHAGEAL ECHOCARDIOGRAM (TEE);  Surgeon: Corliss Skains, MD;  Location: [facility] OR;  Service: Open Heart Surgery;  Laterality: N/A;   VEIN HARVEST Right 05/22/2020   Procedure: Open Vein Harvest;  Surgeon: Corliss Skains, MD;  Location: [facility] OR;   Service: Open Heart Surgery;  Laterality: Right;   VENTRAL HERNIA REPAIR  09/01/2017   mistaken entry   Social History   Tobacco Use   Smoking status: Never    Passive exposure: Never   Smokeless tobacco: Never  Vaping Use   Vaping Use: Never used  Substance Use Topics   Alcohol use: No   Drug use: No   Family History  Problem Relation Age of Onset   CAD Father    Heart failure Sister    CAD Daughter    Heart disease Daughter    Allergies  Allergen Reactions   Iodinated Contrast Media Other (See Comments)    Unknown reaction. Due to kidney issues. Do not use Unknown reaction. Due to kidney issues. Do not use   Codeine Rash   Morphine And Codeine Other (See Comments) and Rash    Unknown Reaction Unknown Reaction   Other     Pt has multiple allergies per pt but unsure of all.    Penicillins Other (See Comments)    Unknown reaction Unknown reaction   Review of Systems  Constitutional:  Negative for chills and fever.  HENT:  Negative for sore throat.   Respiratory:  Negative for cough and shortness of breath.   Cardiovascular:  Negative for chest pain, palpitations and leg swelling.  Gastrointestinal:  Negative for abdominal pain, blood in stool, constipation, diarrhea, nausea and vomiting.  Genitourinary:  Negative for dysuria and hematuria.  Musculoskeletal:  Negative for myalgias.  Skin:  Negative for itching and rash.  Neurological:  Negative for dizziness and headaches.  Psychiatric/Behavioral:  Negative for depression and suicidal ideas.      Objective:     BP (!) 138/58   Pulse 76   Ht 4\' 11"  (1.499 m)   Wt 144 lb 6.4 oz (65.5 kg)   SpO2 95%   BMI 29.17 kg/m  BP Readings from Last 3 Encounters:  06/08/23 (!) 138/58  06/04/23 138/77  05/15/23 (!) 140/78   Physical Exam Vitals reviewed.  Constitutional:      General: She is not in acute distress.    Appearance: Normal appearance. She is not toxic-appearing.  HENT:     Head: Normocephalic and  atraumatic.     Right Ear: External ear normal.     Left Ear: External ear normal.     Nose: Nose normal. No congestion or rhinorrhea.     Mouth/Throat:     Mouth: Mucous membranes are moist.     Pharynx: Oropharynx is clear. No oropharyngeal exudate or posterior oropharyngeal erythema.  Eyes:     General: No scleral icterus.    Extraocular Movements: Extraocular movements intact.     Conjunctiva/sclera: Conjunctivae normal.     Pupils: Pupils are equal, round, and reactive to light.  Cardiovascular:     Rate and Rhythm: Normal rate and regular rhythm.     Pulses: Normal pulses.     Heart sounds: Normal heart sounds. No murmur heard.    No friction rub. No gallop.  Pulmonary:     Effort: Pulmonary effort is normal.     Breath sounds: Normal breath sounds. No wheezing, rhonchi or rales.  Abdominal:     General: Abdomen is flat. Bowel sounds are normal. There is no distension.     Palpations: Abdomen is soft.     Tenderness: There is no abdominal tenderness.     Hernia: A hernia (Multiple abdominal wall hernias) is present.  Musculoskeletal:        General: No swelling. Normal range of motion.     Cervical back: Normal range of motion.     Right lower leg: No edema.     Left lower leg: No edema.  Lymphadenopathy:     Cervical: No cervical adenopathy.  Skin:    General: Skin is warm and dry.     Capillary Refill: Capillary refill takes less than 2 seconds.     Coloration: Skin is not jaundiced.  Neurological:     General: No focal deficit present.     Mental Status: She is alert and oriented to person, place, and time.  Psychiatric:        Mood and Affect: Mood normal.        Behavior: Behavior normal.   Diabetic foot exam was performed.  No deformities or other abnormal visual findings.  Posterior tibialis and dorsalis pulse intact bilaterally.  Intact to touch and monofilament testing bilaterally.    Last CBC Lab Results  Component Value Date   WBC 9.0 06/02/2023    HGB 12.0 06/02/2023   HCT 35.7 (L) 06/02/2023   MCV 94.9 06/02/2023   MCH 31.9 06/02/2023   RDW 13.0 06/02/2023   PLT 227 06/02/2023   Last metabolic panel Lab Results  Component Value Date   GLUCOSE 150 (H) 06/02/2023   NA 136 06/02/2023   K 4.5 06/02/2023   CL 105 06/02/2023   CO2  22 06/02/2023   BUN 23 06/02/2023   CREATININE 1.29 (H) 06/02/2023   GFRNONAA 42 (L) 06/02/2023   CALCIUM 8.8 (L) 06/02/2023   PROT 6.0 (L) 04/24/2023   ALBUMIN 2.9 (L) 04/24/2023   LABGLOB 2.3 11/21/2021   AGRATIO 2.0 11/21/2021   BILITOT 0.5 04/24/2023   ALKPHOS 41 04/24/2023   AST 35 04/24/2023   ALT 24 04/24/2023   ANIONGAP 9 06/02/2023   Last lipids Lab Results  Component Value Date   CHOL 100 04/15/2023   HDL 37 (L) 04/15/2023   LDLCALC 43 04/15/2023   TRIG 102 04/15/2023   CHOLHDL 2.7 04/15/2023   Last hemoglobin A1c Lab Results  Component Value Date   HGBA1C 6.9 (A) 06/04/2023   Last thyroid functions Lab Results  Component Value Date   TSH 2.469 05/21/2022   Last vitamin D Lab Results  Component Value Date   VD25OH 113.0 (H) 08/22/2021     Assessment & Plan:   Problem List Items Addressed This Visit       Essential hypertension    BP remains adequately controlled on current antihypertensive regimen.  No medication changes are indicated today.      Coronary artery disease    History of CAD s/p CABG x 3 in June 2021.  Recent hospital admission for atypical chest pain.  Cardiac workup overall unremarkable.  She has recently been seen by cardiology for follow-up.  Remains on ASA and atorvastatin.      Type 2 diabetes mellitus with diabetic nephropathy (HCC) - Primary    Followed by endocrinology.  She is currently prescribed metformin 500 mg daily and Tradjenta 5 mg daily.  A1c 6.9 last week. -Urine microalbumin/creatinine ratio ordered today -Diabetic foot exam completed today -I have recommended that she contact her ophthalmologist to schedule a diabetic eye  exam      Chronic kidney disease, stage 3b (HCC)    Recent labs remain consistent with CKD stage III.  Followed by nephrology (Dr. Wolfgang Phoenix). -Urine microalbumin/creatinine ratio ordered today      Return in about 3 months (around 09/08/2023).   Billie Lade, MD

## 2023-06-08 NOTE — Assessment & Plan Note (Signed)
Recent labs remain consistent with CKD stage III.  Followed by nephrology (Dr. Wolfgang Phoenix). -Urine microalbumin/creatinine ratio ordered today

## 2023-06-08 NOTE — Assessment & Plan Note (Signed)
BP remains adequately controlled on current antihypertensive regimen.  No medication changes are indicated today. 

## 2023-06-08 NOTE — Assessment & Plan Note (Signed)
History of CAD s/p CABG x 3 in June 2021.  Recent hospital admission for atypical chest pain.  Cardiac workup overall unremarkable.  She has recently been seen by cardiology for follow-up.  Remains on ASA and atorvastatin.

## 2023-06-08 NOTE — Assessment & Plan Note (Addendum)
Followed by endocrinology.  She is currently prescribed metformin 500 mg daily and Tradjenta 5 mg daily.  A1c 6.9 last week. -Urine microalbumin/creatinine ratio ordered today -Diabetic foot exam completed today -I have recommended that she contact her ophthalmologist to schedule a diabetic eye exam

## 2023-06-08 NOTE — Patient Instructions (Signed)
It was a pleasure to see you today.  Thank you for giving [place] the opportunity to be involved in your care.  Below is a brief recap of your visit and next steps.  We will plan to see you again in 3 months.  Summary No medication changes today Repeat urine study ordered I recommend contacting your eye doctor to schedule a diabetic eye exam Follow up in 3 months

## 2023-06-10 ENCOUNTER — Other Ambulatory Visit: Payer: Self-pay | Admitting: Internal Medicine

## 2023-06-10 DIAGNOSIS — I10 Essential (primary) hypertension: Secondary | ICD-10-CM

## 2023-06-10 DIAGNOSIS — N1832 Chronic kidney disease, stage 3b: Secondary | ICD-10-CM

## 2023-06-10 DIAGNOSIS — E1121 Type 2 diabetes mellitus with diabetic nephropathy: Secondary | ICD-10-CM

## 2023-06-10 DIAGNOSIS — R809 Proteinuria, unspecified: Secondary | ICD-10-CM

## 2023-06-10 LAB — MICROALBUMIN / CREATININE URINE RATIO
Creatinine, Urine: 30.7 mg/dL
Microalb/Creat Ratio: 93 mg/g creat — ABNORMAL HIGH (ref 0–29)
Microalbumin, Urine: 28.4 ug/mL

## 2023-06-10 MED ORDER — LISINOPRIL 5 MG PO TABS
5.0000 mg | ORAL_TABLET | Freq: Every day | ORAL | 3 refills | Status: DC
Start: 2023-06-10 — End: 2023-10-01

## 2023-06-11 ENCOUNTER — Telehealth: Payer: Self-pay | Admitting: Internal Medicine

## 2023-06-11 DIAGNOSIS — Z905 Acquired absence of kidney: Secondary | ICD-10-CM | POA: Diagnosis not present

## 2023-06-11 DIAGNOSIS — D638 Anemia in other chronic diseases classified elsewhere: Secondary | ICD-10-CM | POA: Diagnosis not present

## 2023-06-11 DIAGNOSIS — N1831 Chronic kidney disease, stage 3a: Secondary | ICD-10-CM | POA: Diagnosis not present

## 2023-06-11 DIAGNOSIS — E1122 Type 2 diabetes mellitus with diabetic chronic kidney disease: Secondary | ICD-10-CM | POA: Diagnosis not present

## 2023-06-11 NOTE — Telephone Encounter (Signed)
Spoke with [name] and patient about medication.

## 2023-06-11 NOTE — Telephone Encounter (Signed)
[name], called in on patient behalf. Wants a call back kin regard to patients most recently added med.

## 2023-06-17 ENCOUNTER — Other Ambulatory Visit: Payer: Self-pay

## 2023-06-17 MED ORDER — CETIRIZINE HCL 10 MG PO TABS: 10.0000 mg | ORAL_TABLET | Freq: Every day | ORAL | 3 refills | Status: AC | PRN

## 2023-06-23 ENCOUNTER — Other Ambulatory Visit (HOSPITAL_COMMUNITY)
Admission: RE | Admit: 2023-06-23 | Discharge: 2023-06-23 | Disposition: A | Discharge status: Home / Self Care | Payer: Medicare Other | Source: Ambulatory Visit | Attending: Nephrology | Admitting: Nephrology

## 2023-06-23 DIAGNOSIS — E1129 Type 2 diabetes mellitus with other diabetic kidney complication: Secondary | ICD-10-CM | POA: Insufficient documentation

## 2023-06-23 DIAGNOSIS — N1831 Chronic kidney disease, stage 3a: Secondary | ICD-10-CM | POA: Diagnosis not present

## 2023-06-23 DIAGNOSIS — D638 Anemia in other chronic diseases classified elsewhere: Secondary | ICD-10-CM | POA: Insufficient documentation

## 2023-06-23 DIAGNOSIS — N189 Chronic kidney disease, unspecified: Secondary | ICD-10-CM | POA: Diagnosis present

## 2023-06-23 DIAGNOSIS — E1122 Type 2 diabetes mellitus with diabetic chronic kidney disease: Secondary | ICD-10-CM | POA: Diagnosis not present

## 2023-06-23 DIAGNOSIS — E559 Vitamin D deficiency, unspecified: Secondary | ICD-10-CM | POA: Diagnosis not present

## 2023-06-23 DIAGNOSIS — Z905 Acquired absence of kidney: Secondary | ICD-10-CM | POA: Diagnosis not present

## 2023-06-23 LAB — CBC WITH DIFFERENTIAL/PLATELET
Abs Immature Granulocytes: 0.01 10*3/uL (ref 0.00–0.07)
Basophils Absolute: 0.1 10*3/uL (ref 0.0–0.1)
Basophils Relative: 1 %
Eosinophils Absolute: 0.2 10*3/uL (ref 0.0–0.5)
Eosinophils Relative: 3 %
HCT: 36.4 % (ref 36.0–46.0)
Hemoglobin: 12.5 g/dL (ref 12.0–15.0)
Immature Granulocytes: 0 %
Lymphocytes Relative: 29 %
Lymphs Abs: 2.2 10*3/uL (ref 0.7–4.0)
MCH: 32.3 pg (ref 26.0–34.0)
MCHC: 34.3 g/dL (ref 30.0–36.0)
MCV: 94.1 fL (ref 80.0–100.0)
Monocytes Absolute: 0.5 10*3/uL (ref 0.1–1.0)
Monocytes Relative: 7 %
Neutro Abs: 4.7 10*3/uL (ref 1.7–7.7)
Neutrophils Relative %: 60 %
Platelets: 221 10*3/uL (ref 150–400)
RBC: 3.87 MIL/uL (ref 3.87–5.11)
RDW: 12.8 % (ref 11.5–15.5)
WBC: 7.7 10*3/uL (ref 4.0–10.5)
nRBC: 0 % (ref 0.0–0.2)

## 2023-06-23 LAB — RENAL FUNCTION PANEL
Albumin: 3.9 g/dL (ref 3.5–5.0)
Anion gap: 7 (ref 5–15)
BUN: 23 mg/dL (ref 8–23)
CO2: 20 mmol/L — ABNORMAL LOW (ref 22–32)
Calcium: 8.8 mg/dL — ABNORMAL LOW (ref 8.9–10.3)
Chloride: 111 mmol/L (ref 98–111)
Creatinine, Ser: 1.22 mg/dL — ABNORMAL HIGH (ref 0.44–1.00)
GFR, Estimated: 45 mL/min — ABNORMAL LOW (ref >60)
Glucose, Bld: 151 mg/dL — ABNORMAL HIGH (ref 70–99)
Phosphorus: 3.7 mg/dL (ref 2.5–4.6)
Potassium: 4.4 mmol/L (ref 3.5–5.1)
Sodium: 138 mmol/L (ref 135–145)

## 2023-06-23 LAB — VITAMIN D 25 HYDROXY (VIT D DEFICIENCY, FRACTURES): Vit D, 25-Hydroxy: 51.37 ng/mL (ref 30–100)

## 2023-06-23 LAB — PROTEIN / CREATININE RATIO, URINE
Creatinine, Urine: 159 mg/dL
Protein Creatinine Ratio: 0.2 mg/mg{Cre} — ABNORMAL HIGH (ref 0.00–0.15)
Total Protein, Urine: 32 mg/dL

## 2023-06-25 LAB — PTH, INTACT AND CALCIUM
Calcium, Total (PTH): 9.2 mg/dL (ref 8.7–10.3)
PTH: 26 pg/mL (ref 15–65)

## 2023-07-06 ENCOUNTER — Other Ambulatory Visit: Payer: Self-pay | Admitting: Nurse Practitioner

## 2023-07-21 ENCOUNTER — Encounter (HOSPITAL_COMMUNITY): Payer: Self-pay | Admitting: Emergency Medicine

## 2023-07-21 ENCOUNTER — Emergency Department (HOSPITAL_COMMUNITY): Payer: Medicare Other

## 2023-07-21 ENCOUNTER — Other Ambulatory Visit: Payer: Self-pay

## 2023-07-21 ENCOUNTER — Emergency Department (HOSPITAL_COMMUNITY)
Admission: EM | Admit: 2023-07-21 | Discharge: 2023-07-22 | Disposition: A | Discharge status: Home / Self Care | Payer: Medicare Other | Attending: Emergency Medicine | Admitting: Emergency Medicine

## 2023-07-21 DIAGNOSIS — L309 Dermatitis, unspecified: Secondary | ICD-10-CM | POA: Diagnosis not present

## 2023-07-21 DIAGNOSIS — K439 Ventral hernia without obstruction or gangrene: Secondary | ICD-10-CM | POA: Insufficient documentation

## 2023-07-21 DIAGNOSIS — E1122 Type 2 diabetes mellitus with diabetic chronic kidney disease: Secondary | ICD-10-CM | POA: Insufficient documentation

## 2023-07-21 DIAGNOSIS — L03311 Cellulitis of abdominal wall: Secondary | ICD-10-CM | POA: Diagnosis not present

## 2023-07-21 DIAGNOSIS — N189 Chronic kidney disease, unspecified: Secondary | ICD-10-CM | POA: Diagnosis not present

## 2023-07-21 DIAGNOSIS — Z7982 Long term (current) use of aspirin: Secondary | ICD-10-CM | POA: Diagnosis not present

## 2023-07-21 DIAGNOSIS — Z905 Acquired absence of kidney: Secondary | ICD-10-CM | POA: Diagnosis not present

## 2023-07-21 DIAGNOSIS — K551 Chronic vascular disorders of intestine: Secondary | ICD-10-CM | POA: Diagnosis not present

## 2023-07-21 DIAGNOSIS — K469 Unspecified abdominal hernia without obstruction or gangrene: Secondary | ICD-10-CM | POA: Diagnosis present

## 2023-07-21 DIAGNOSIS — Z7984 Long term (current) use of oral hypoglycemic drugs: Secondary | ICD-10-CM | POA: Diagnosis not present

## 2023-07-21 LAB — URINALYSIS, ROUTINE W REFLEX MICROSCOPIC
Bacteria, UA: NONE SEEN
Bilirubin Urine: NEGATIVE
Glucose, UA: NEGATIVE mg/dL
Hgb urine dipstick: NEGATIVE
Ketones, ur: NEGATIVE mg/dL
Nitrite: NEGATIVE
Protein, ur: NEGATIVE mg/dL
Specific Gravity, Urine: 1.008 (ref 1.005–1.030)
pH: 5 (ref 5.0–8.0)

## 2023-07-21 LAB — CBC WITH DIFFERENTIAL/PLATELET
Abs Immature Granulocytes: 0.02 10*3/uL (ref 0.00–0.07)
Basophils Absolute: 0.1 10*3/uL (ref 0.0–0.1)
Basophils Relative: 1 %
Eosinophils Absolute: 0.2 10*3/uL (ref 0.0–0.5)
Eosinophils Relative: 2 %
HCT: 36.7 % (ref 36.0–46.0)
Hemoglobin: 12.2 g/dL (ref 12.0–15.0)
Immature Granulocytes: 0 %
Lymphocytes Relative: 23 %
Lymphs Abs: 2.2 10*3/uL (ref 0.7–4.0)
MCH: 31.9 pg (ref 26.0–34.0)
MCHC: 33.2 g/dL (ref 30.0–36.0)
MCV: 96.1 fL (ref 80.0–100.0)
Monocytes Absolute: 0.6 10*3/uL (ref 0.1–1.0)
Monocytes Relative: 6 %
Neutro Abs: 6.4 10*3/uL (ref 1.7–7.7)
Neutrophils Relative %: 68 %
Platelets: 244 10*3/uL (ref 150–400)
RBC: 3.82 MIL/uL — ABNORMAL LOW (ref 3.87–5.11)
RDW: 12.5 % (ref 11.5–15.5)
WBC: 9.4 10*3/uL (ref 4.0–10.5)
nRBC: 0 % (ref 0.0–0.2)

## 2023-07-21 LAB — BASIC METABOLIC PANEL
Anion gap: 11 (ref 5–15)
BUN: 21 mg/dL (ref 8–23)
CO2: 25 mmol/L (ref 22–32)
Calcium: 9.1 mg/dL (ref 8.9–10.3)
Chloride: 104 mmol/L (ref 98–111)
Creatinine, Ser: 1.17 mg/dL — ABNORMAL HIGH (ref 0.44–1.00)
GFR, Estimated: 47 mL/min — ABNORMAL LOW (ref >60)
Glucose, Bld: 138 mg/dL — ABNORMAL HIGH (ref 70–99)
Potassium: 4.3 mmol/L (ref 3.5–5.1)
Sodium: 140 mmol/L (ref 135–145)

## 2023-07-21 MED ORDER — ZINC OXIDE 40 % EX OINT: 1.0000 | TOPICAL_OINTMENT | Freq: Two times a day (BID) | CUTANEOUS | 0 refills | Status: AC

## 2023-07-21 MED ORDER — CEPHALEXIN 500 MG PO CAPS: 500.0000 mg | ORAL_CAPSULE | Freq: Four times a day (QID) | ORAL | 0 refills | Status: DC

## 2023-07-21 MED ORDER — CEPHALEXIN 500 MG PO CAPS
500.0000 mg | ORAL_CAPSULE | Freq: Once | ORAL | Status: AC
  Administered 2023-07-21: 500 mg via ORAL
  Filled 2023-07-21: qty 1

## 2023-07-21 MED ORDER — FLUCONAZOLE 150 MG PO TABS
150.0000 mg | ORAL_TABLET | Freq: Once | ORAL | Status: AC
  Administered 2023-07-21: 150 mg via ORAL
  Filled 2023-07-21: qty 1

## 2023-07-21 MED ORDER — ZINC OXIDE 40 % EX OINT
TOPICAL_OINTMENT | Freq: Once | CUTANEOUS | Status: AC
  Filled 2023-07-21: qty 57

## 2023-07-21 NOTE — Discharge Instructions (Signed)
You were seen in the ER for abdominal discomfort.  It appears that you have bacterial infection of the skin.  Take the antibiotics that are prescribed.  Additionally we think that you likely have a yeast infection.  Apply the ointment cream prescribed twice a day.  Return to the ER if your symptoms get worse. Also consider returning to the ER for wound recheck in 3 days.

## 2023-07-21 NOTE — ED Provider Notes (Signed)
[facility] EMERGENCY DEPARTMENT AT [facility] Provider Note   CSN: [id number] Arrival date & time: 07/21/23  1741     History  Chief Complaint  Patient presents with   Wound Check    [name] [name] is a 82 y.o. female.  HPI    82 year old female comes in a chief complaint of pain around her hernia site.  Patient has history of CKD, diabetes, ventral hernia.  She states that over the last 7 to 10 days she has had increased redness, pain around her hernia.  More recently she has started noticing drainage of white, foul-smelling fluid from below her hernia.  Review of systems negative for any fevers, chills, nausea, vomiting.  Patient however is having increasing pain around the site of the hernia.  Home Medications Prior to Admission medications   Medication Sig Start Date End Date Taking? Authorizing Provider  cephALEXin (KEFLEX) 500 MG capsule Take 1 capsule (500 mg total) by mouth 4 (four) times daily. 07/21/23  Yes , , MD  lisinopril (ZESTRIL) 5 MG tablet Take 1 tablet (5 mg total) by mouth daily. 06/10/23   Billie Lade, MD  liver oil-zinc oxide (DESITIN) 40 % ointment Apply 1 Application topically in the morning and at bedtime for 10 days. May continue application beyond 10 days if rash not clear 07/21/23 07/31/23 Yes , Janey Genta, MD  Accu-Chek Softclix Lancets lancets Use as instructed 02/12/21   Dani Gobble, NP  acetaminophen (TYLENOL) 325 MG tablet Take 650 mg by mouth every 6 (six) hours as needed for moderate pain.    [provider]  aspirin EC 81 MG tablet Take 1 tablet (81 mg total) by mouth daily. Swallow whole. 12/25/20   Jonelle Sidle, MD  atorvastatin (LIPITOR) 40 MG tablet TAKE 1 TABLET BY MOUTH AT BEDTIME. 05/04/23   Jonelle Sidle, MD  blood glucose meter kit and supplies Dispense based on patient and insurance preference. Use up to four times daily as directed. (FOR ICD-10 E10.9, E11.9). 01/08/23   Dani Gobble, NP  cetirizine (ZYRTEC) 10 MG tablet Take 1 tablet (10 mg total) by mouth daily as needed (allergies). 06/17/23   Billie Lade, MD  famotidine (PEPCID) 20 MG tablet Take 1 tablet (20 mg total) by mouth daily. 04/25/23   Vassie Loll, MD  fish oil-omega-3 fatty acids 1000 MG capsule Take 1 g by mouth daily.    [provider]  glucose blood (ACCU-CHEK GUIDE) test strip Use as instructed to monitor glucose twice daily 12/04/21   Dani Gobble, NP  glucose blood test strip Use as instructed to monitor glucose twice daily 08/28/21   Dani Gobble, NP  guaiFENesin-dextromethorphan (ROBITUSSIN DM) 100-10 MG/5ML syrup Take 5 mLs by mouth every 4 (four) hours as needed for cough. 10/27/22   Billie Lade, MD  linagliptin (TRADJENTA) 5 MG TABS tablet Take 1 tablet by mouth once daily 07/06/23   Dani Gobble, NP  metFORMIN (GLUCOPHAGE) 500 MG tablet Take 1 tablet (500 mg total) by mouth daily with breakfast. 06/04/23   Dani Gobble, NP  Multiple Vitamin (MULITIVITAMIN WITH MINERALS) TABS Take 1 tablet by mouth daily.    [provider]  nitroGLYCERIN (NITROSTAT) 0.4 MG SL tablet Place 1 tablet (0.4 mg total) under the tongue every 5 (five) minutes x 3 doses as needed for chest pain (if no relief after 3rd dose, proceed to the ED or call 911). 04/07/23   Sharlene Dory,  NP  polyethylene glycol (MIRALAX / GLYCOLAX) 17 g packet Take 17 g by mouth daily as needed for mild constipation or moderate constipation. 12/01/22   Billie Lade, MD      Allergies    Iodinated contrast media, Codeine, Morphine and codeine, Other, and Penicillins    Review of Systems   Review of Systems  All other systems reviewed and are negative.   Physical Exam Updated Vital Signs BP (!) 121/53   Pulse (!) 55   Temp 98 F (36.7 C) (Oral)   Resp 16   Ht 4\' 11"  (1.499 m)   Wt 65.3 kg   SpO2 97%   BMI 29.08 kg/m  Physical Exam Vitals and nursing note reviewed.   Constitutional:      Appearance: She is well-developed.  HENT:     Head: Atraumatic.  Eyes:     Extraocular Movements: Extraocular movements intact.     Pupils: Pupils are equal, round, and reactive to light.  Cardiovascular:     Rate and Rhythm: Normal rate.  Pulmonary:     Effort: Pulmonary effort is normal.  Abdominal:     Comments: Patient has a large ventral hernia.  Over the underbelly of the hernia there is erythema with evidence of drainage/moisture.  There is ordered.  No crepitus.  Positive tenderness.  Musculoskeletal:     Cervical back: Normal range of motion and neck supple.  Skin:    General: Skin is warm and dry.  Neurological:     Mental Status: She is alert and oriented to person, place, and time.        ED Results / Procedures / Treatments   Labs (all labs ordered are listed, but only abnormal results are displayed) Labs Reviewed  CBC WITH DIFFERENTIAL/PLATELET - Abnormal; Notable for the following components:      Result Value   RBC 3.82 (*)    All other components within normal limits  BASIC METABOLIC PANEL - Abnormal; Notable for the following components:   Glucose, Bld 138 (*)    Creatinine, Ser 1.17 (*)    GFR, Estimated 47 (*)    All other components within normal limits  URINALYSIS, ROUTINE W REFLEX MICROSCOPIC - Abnormal; Notable for the following components:   Leukocytes,Ua MODERATE (*)    All other components within normal limits    EKG None  Radiology CT ABDOMEN PELVIS WO CONTRAST  Result Date: 07/21/2023 CLINICAL DATA:  Left lower quadrant abdominal pain EXAM: CT ABDOMEN AND PELVIS WITHOUT CONTRAST TECHNIQUE: Multidetector CT imaging of the abdomen and pelvis was performed following the standard protocol without IV contrast. RADIATION DOSE REDUCTION: This exam was performed according to the departmental dose-optimization program which includes automated exposure control, adjustment of the mA and/or kV according to patient size and/or use  of iterative reconstruction technique. COMPARISON:  None Available. FINDINGS: Lower chest: No acute abnormality. Hepatobiliary: No focal liver abnormality is seen. No gallstones, gallbladder wall thickening, or biliary dilatation. Pancreas: Unremarkable Spleen: Unremarkable Adrenals/Urinary Tract: The adrenal glands are unremarkable. Status post left nephrectomy. Stable mild nonspecific perinephric stranding involving the residual right kidney. Vascular calcifications noted within the right renal hilum. The right kidney is otherwise unremarkable. The bladder is unremarkable. Stomach/Bowel: Large left spigelian hernia contains multiple loops of unremarkable small bowel as well as the proximal sigmoid colon. Left broad-based superior lung bar hernia contains the splenic flexure which is unremarkable. The stomach, small bowel, and large bowel are otherwise unremarkable and there is no evidence of  obstruction or focal inflammation. The appendix is normal. No free intraperitoneal gas or fluid. Vascular/Lymphatic: Extensive aortoiliac atherosclerotic calcification without evidence of aneurysm. Particularly prominent atherosclerotic calcification is seen at the origin of the mesenteric vessels and right renal artery. No pathologic adenopathy within the abdomen and pelvis. Reproductive: Uterus and bilateral adnexa are unremarkable. Other: None significant Musculoskeletal: No acute bone abnormality. Osseous structures are age-appropriate. IMPRESSION: 1. No acute intra-abdominal pathology identified. No definite radiographic explanation for the patient's reported symptoms. 2. Stable abdominal wall hernias including large left Spigelian hernia containing multiple loops of unremarkable small bowel as well as the proximal sigmoid colon. Left broad-based superior lumbar hernia containing the splenic flexure. No evidence of bowel obstruction or focal inflammation. 3. Status post left nephrectomy. 4. Extensive aortoiliac  atherosclerotic calcification without evidence of aneurysm. Particularly prominent atherosclerotic calcification is seen at the origin of the mesenteric vessels and right renal artery. If indicated, this would be better assessed with CT arteriography. Aortic Atherosclerosis (ICD10-I70.0). Electronically Signed   By: Helyn Numbers M.D.   On: 07/21/2023 21:59    Procedures Procedures    Medications Ordered in ED Medications  liver oil-zinc oxide (DESITIN) 40 % ointment (has no administration in time range)  fluconazole (DIFLUCAN) tablet 150 mg (has no administration in time range)  cephALEXin (KEFLEX) capsule 500 mg (has no administration in time range)    ED Course/ Medical Decision Making/ A&P                                 Medical Decision Making Amount and/or Complexity of Data Reviewed Labs: ordered. Radiology: ordered.  Risk OTC drugs. Prescription drug management.   This patient presents to the ED with chief complaint(s) of abdominal pain with pertinent past medical history of diabetes, large ventral hernia.The complaint involves an extensive differential diagnosis and also carries with it a high risk of complications and morbidity.    The differential diagnosis includes : Abdominal wall abscess, incarcerated hernia, abdominal wall cellulitis, intra-abdominal abscess, DKA, acute on chronic renal failure  The initial plan is to get basic labs, CT abdomen pelvis without contrast.  CT with contrast not ordered as patient has severe allergy to contrast and our suspicion is high that this is likely an abdominal wall process only.   Additional history obtained: Additional history obtained from family -daughter was at the bedside. Records reviewed Primary Care Documents  Independent labs interpretation:  The following labs were independently interpreted: Initial CBC shows normal white count.  BMP is also reassuring.  Independent visualization and interpretation of  imaging: - I independently visualized the following imaging with scope of interpretation limited to determining acute life threatening conditions related to emergency care: CT scan of the abdomen, which revealed no evidence of free air.  I do not see any intra-abdominal extension of the infection.  Per radiologist, CT scan is reassuring.  Treatment and Reassessment: Results of the ED workup discussed with the patient.  I advised that we will treat her like an abdominal wall cellulitis and also give her barrier cream or ointment.  She will also receive a dose of Diflucan now.  Patient advised to return to the ER for wound recheck in 3 days.  Consideration for admission or further workup: Admission considered, however patient is not clinically septic, has normal white count and had a CT scan that is reassuring.  She also has family member who can bring her back  to the ER.   Final Clinical Impression(s) / ED Diagnoses Final diagnoses:  Abdominal wall cellulitis  Dermatitis    Rx / DC Orders ED Discharge Orders          Ordered    cephALEXin (KEFLEX) 500 MG capsule  4 times daily        07/21/23 2256    liver oil-zinc oxide (DESITIN) 40 % ointment  2 times daily        07/21/23 2256              Derwood Kaplan, MD 07/21/23 2258

## 2023-07-21 NOTE — ED Notes (Signed)
Pt ambulated to bathroom with minimal assist 

## 2023-07-21 NOTE — ED Triage Notes (Signed)
Pt has large area maceration under left lower abdomen red and seeping fluid x 1 week.

## 2023-07-21 NOTE — ED Notes (Signed)
Pt returned from CT °

## 2023-07-22 DIAGNOSIS — K439 Ventral hernia without obstruction or gangrene: Secondary | ICD-10-CM | POA: Diagnosis not present

## 2023-07-25 ENCOUNTER — Emergency Department (HOSPITAL_COMMUNITY)
Admission: EM | Admit: 2023-07-25 | Discharge: 2023-07-25 | Disposition: A | Discharge status: Home / Self Care | Payer: Medicare Other | Attending: Emergency Medicine | Admitting: Emergency Medicine

## 2023-07-25 ENCOUNTER — Other Ambulatory Visit: Payer: Self-pay

## 2023-07-25 DIAGNOSIS — L03311 Cellulitis of abdominal wall: Secondary | ICD-10-CM

## 2023-07-25 DIAGNOSIS — R21 Rash and other nonspecific skin eruption: Secondary | ICD-10-CM | POA: Insufficient documentation

## 2023-07-25 DIAGNOSIS — Z7982 Long term (current) use of aspirin: Secondary | ICD-10-CM | POA: Insufficient documentation

## 2023-07-25 DIAGNOSIS — Z79899 Other long term (current) drug therapy: Secondary | ICD-10-CM | POA: Insufficient documentation

## 2023-07-25 DIAGNOSIS — Z7984 Long term (current) use of oral hypoglycemic drugs: Secondary | ICD-10-CM | POA: Diagnosis not present

## 2023-07-25 NOTE — ED Triage Notes (Signed)
Pt was seen 4 days ago for left sided abd cellulitis that was draining. Pt was told at time of discharge to return to ED on Sunday for a follow up. Pt states she has been feeling better.

## 2023-07-25 NOTE — ED Notes (Signed)
Dc instructions reviewed with pt no questions or concerns at this time. Will follow up with pcp as needed.  

## 2023-07-25 NOTE — Discharge Instructions (Signed)
Your infection looks much better with the current treatment plan, finish the antibiotics as prescribed.  I do recommend continuing the Desitin as needed also which can be very soothing to your skin.  Is important that you keep this area completely dry as moisture can lead to return of infection.

## 2023-07-25 NOTE — ED Provider Notes (Signed)
EMERGENCY DEPARTMENT AT [facility] Provider Note   CSN: [id number] Arrival date & time: 07/25/23  1716     History {Add pertinent medical, surgical, social history, OB history to HPI:1} Chief Complaint  Patient presents with   Hernia    [name] [name] is a 82 y.o. female   The history is provided by the patient.       Home Medications Prior to Admission medications   Medication Sig Start Date End Date Taking? Authorizing Provider  lisinopril (ZESTRIL) 5 MG tablet Take 1 tablet (5 mg total) by mouth daily. 06/10/23   Billie Lade, MD  Accu-Chek Softclix Lancets lancets Use as instructed 02/12/21   Dani Gobble, NP  acetaminophen (TYLENOL) 325 MG tablet Take 650 mg by mouth every 6 (six) hours as needed for moderate pain.    [provider]  aspirin EC 81 MG tablet Take 1 tablet (81 mg total) by mouth daily. Swallow whole. 12/25/20   Jonelle Sidle, MD  atorvastatin (LIPITOR) 40 MG tablet TAKE 1 TABLET BY MOUTH AT BEDTIME. 05/04/23   Jonelle Sidle, MD  blood glucose meter kit and supplies Dispense based on patient and insurance preference. Use up to four times daily as directed. (FOR ICD-10 E10.9, E11.9). 01/08/23   Dani Gobble, NP  cephALEXin (KEFLEX) 500 MG capsule Take 1 capsule (500 mg total) by mouth 4 (four) times daily. 07/21/23   Derwood Kaplan, MD  cetirizine (ZYRTEC) 10 MG tablet Take 1 tablet (10 mg total) by mouth daily as needed (allergies). 06/17/23   Billie Lade, MD  famotidine (PEPCID) 20 MG tablet Take 1 tablet (20 mg total) by mouth daily. 04/25/23   Vassie Loll, MD  fish oil-omega-3 fatty acids 1000 MG capsule Take 1 g by mouth daily.    [provider]  glucose blood (ACCU-CHEK GUIDE) test strip Use as instructed to monitor glucose twice daily 12/04/21   Dani Gobble, NP  glucose blood test strip Use as instructed to monitor glucose twice daily 08/28/21   Dani Gobble, NP   guaiFENesin-dextromethorphan (ROBITUSSIN DM) 100-10 MG/5ML syrup Take 5 mLs by mouth every 4 (four) hours as needed for cough. 10/27/22   Billie Lade, MD  linagliptin (TRADJENTA) 5 MG TABS tablet Take 1 tablet by mouth once daily 07/06/23   Dani Gobble, NP  liver oil-zinc oxide (DESITIN) 40 % ointment Apply 1 Application topically in the morning and at bedtime for 10 days. May continue application beyond 10 days if rash not clear 07/21/23 07/31/23  Derwood Kaplan, MD  metFORMIN (GLUCOPHAGE) 500 MG tablet Take 1 tablet (500 mg total) by mouth daily with breakfast. 06/04/23   Dani Gobble, NP  Multiple Vitamin (MULITIVITAMIN WITH MINERALS) TABS Take 1 tablet by mouth daily.    [provider]  nitroGLYCERIN (NITROSTAT) 0.4 MG SL tablet Place 1 tablet (0.4 mg total) under the tongue every 5 (five) minutes x 3 doses as needed for chest pain (if no relief after 3rd dose, proceed to the ED or call 911). 04/07/23   Sharlene Dory, NP  polyethylene glycol (MIRALAX / GLYCOLAX) 17 g packet Take 17 g by mouth daily as needed for mild constipation or moderate constipation. 12/01/22   Billie Lade, MD      Allergies    Iodinated contrast media, Codeine, Morphine and codeine, Other, and Penicillins    Review of Systems   Review of Systems  Physical Exam Updated  Vital Signs BP (!) 162/48   Pulse 74   Temp 99.4 F (37.4 C) (Oral)   Resp 16   Ht 4\' 11"  (1.499 m)   Wt 65 kg   SpO2 100%   BMI 28.94 kg/m  Physical Exam  ED Results / Procedures / Treatments   Labs (all labs ordered are listed, but only abnormal results are displayed) Labs Reviewed - No data to display  EKG None  Radiology No results found.  Procedures Procedures  {Document cardiac monitor, telemetry assessment procedure when appropriate:1}  Medications Ordered in ED Medications - No data to display  ED Course/ Medical Decision Making/ A&P   {   Click here for ABCD2, HEART and other  calculatorsREFRESH Note before signing :1}                              Medical Decision Making  ***  {Document critical care time when appropriate:1} {Document review of labs and clinical decision tools ie heart score, Chads2Vasc2 etc:1}  {Document your independent review of radiology images, and any outside records:1} {Document your discussion with family members, caretakers, and with consultants:1} {Document social determinants of health affecting pt's care:1} {Document your decision making why or why not admission, treatments were needed:1} Final Clinical Impression(s) / ED Diagnoses Final diagnoses:  None    Rx / DC Orders ED Discharge Orders     None

## 2023-07-31 ENCOUNTER — Encounter: Payer: Self-pay | Admitting: Pulmonary Disease

## 2023-07-31 ENCOUNTER — Ambulatory Visit (INDEPENDENT_AMBULATORY_CARE_PROVIDER_SITE_OTHER): Payer: Medicare Other | Admitting: Pulmonary Disease

## 2023-07-31 VITALS — BP 131/71 | HR 70 | Ht 59.0 in | Wt 146.0 lb

## 2023-07-31 DIAGNOSIS — G4733 Obstructive sleep apnea (adult) (pediatric): Secondary | ICD-10-CM

## 2023-07-31 NOTE — Addendum Note (Signed)
Addended by: Shelby Dubin on: 07/31/2023 12:07 PM   Modules accepted: Orders

## 2023-07-31 NOTE — Assessment & Plan Note (Signed)
She has severe obstructive sleep apnea based on sleep study done in 2018.  She has been sleeping in a recliner since then this possibly self treating but still seems to have symptoms of daytime sleepiness. She is hesitant about initiating CPAP therapy since she is not sure how she will tolerate a mask over her face.  We will proceed with a home sleep test.  This will probably be performed with her sleeping in a recliner.  If she has significant OSA then we will proceed with CPAP therapy mild nasal mask to begin with but she may likely need a fullface mask. It may be best to do a formal CPAP titration to make sure that she gets used to the idea of using a mask before initiating therapy

## 2023-07-31 NOTE — Patient Instructions (Signed)
X home sleep test  Based on this, we will recommend CPAP therapy You have SEVERE sleep apnea

## 2023-07-31 NOTE — Addendum Note (Signed)
Addended by: Cyril Mourning V on: 07/31/2023 11:35 AM   Modules accepted: Orders

## 2023-07-31 NOTE — Progress Notes (Signed)
Subjective:    Patient ID: [name] [name], female    DOB: 01/14/1941, 82 y.o.   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  She shows me a picture of her sleeping with her mouth open.  She sleeps in a recliner for many years.  The TV stays on through the night. She does not have a fixed bedtime or wake up time.  Sleep latency is minimal, she reports 1-2 nocturnal awakenings can be up anytime between 7 to 10 AM, feels tired without dryness of mouth or headaches. She is unintentional naps during the day There is no history suggestive of cataplexy, sleep paralysis or parasomnias   She was started on lisinopril by PCP but she is not taking this She had a sleep study done few years ago which we reviewed.  She feels that this was not discussed by the ordering physician  Significant tests/ events reviewed  08/2017 NPSG >> wt 147 pounds, AHI 32/hour, low saturation 81%   Past Medical History:  Diagnosis Date   Anxiety    CAD (coronary artery disease)    Multivessel status post CABG June 2021 - LIMA to LAD, SVG to distal RCA, SVG to OM1   Chronic obstructive pulmonary disease, unspecified (HCC)    Depression    Essential hypertension    GERD    History of pneumonia    Incarcerated ventral hernia    Osteoarthritis    Type 2 diabetes mellitus (HCC)    Past Surgical History:  Procedure Laterality Date   CATARACT EXTRACTION Bilateral 2023   CORONARY ARTERY BYPASS GRAFT N/A 05/22/2020   Procedure: CORONARY ARTERY BYPASS GRAFTING (CABG) x3 LIMA TO LAD, SVG TO OM1, SVG TO DISTAL RIGHT;  Surgeon: Corliss Skains, MD;  Location: [facility] OR;  Service: Open Heart Surgery;  Laterality: N/A;   LEFT HEART CATH AND  CORONARY ANGIOGRAPHY N/A 05/21/2020   Procedure: LEFT HEART CATH AND CORONARY ANGIOGRAPHY;  Surgeon: [place], Peter M, MD;  Location: [facility] INVASIVE CV LAB;  Service: Cardiovascular;  Laterality: N/A;   NEPHRECTOMY     TEE WITHOUT CARDIOVERSION N/A 05/22/2020   Procedure: TRANSESOPHAGEAL ECHOCARDIOGRAM (TEE);  Surgeon: Corliss Skains, MD;  Location: [facility] OR;  Service: Open Heart Surgery;  Laterality: N/A;   VEIN HARVEST Right 05/22/2020   Procedure: Open Vein Harvest;  Surgeon: Corliss Skains, MD;  Location: [facility] OR;  Service: Open Heart Surgery;  Laterality: Right;   VENTRAL HERNIA REPAIR  09/01/2017   mistaken entry    Allergies  Allergen Reactions   Iodinated Contrast Media Other (See Comments)    Unknown reaction. Due to kidney issues. Do not use Unknown reaction. Due to kidney issues. Do not use   Codeine Rash   Morphine And Codeine Other (See Comments) and Rash    Unknown Reaction Unknown Reaction   Other     Pt has multiple allergies per pt but unsure of all.    Penicillins Other (See Comments)    Unknown reaction Unknown reaction    Social History   Socioeconomic History   Marital status: Widowed    Spouse name: Not on file   Number of children: 1   Years of education: 12   Highest education  level: 12th grade  Occupational History   Occupation: Retired  Tobacco Use   Smoking status: Never    Passive exposure: Never   Smokeless tobacco: Never  Vaping Use   Vaping status: Never Used  Substance and Sexual Activity   Alcohol use: No   Drug use: No   Sexual activity: Not Currently  Other Topics Concern   Not on file  Social History Narrative   Lives alone.  Has 1 daughter      Enjoys reading and cooking      Eats all food groups   Caffeine, some coffee   Water reports drinking pretty well throughout the day      Social Determinants of Health   Financial Resource Strain: Low Risk  (01/19/2023)   Overall Financial Resource Strain (CARDIA)     Difficulty of Paying Living Expenses: Not hard at all  Food Insecurity: No Food Insecurity (04/23/2023)   Hunger Vital Sign    Worried About Running Out of Food in the Last Year: Never true    Ran Out of Food in the Last Year: Never true  Transportation Needs: No Transportation Needs (04/23/2023)   PRAPARE - Administrator, Civil Service (Medical): No    Lack of Transportation (Non-Medical): No  Physical Activity: Insufficiently Active (01/19/2023)   Exercise Vital Sign    Days of Exercise per Week: 5 days    Minutes of Exercise per Session: 20 min  Stress: No Stress Concern Present (01/19/2023)   [employer] of Occupational Health - Occupational Stress Questionnaire    Feeling of Stress : Not at all  Social Connections: Moderately Isolated (01/19/2023)   Social Connection and Isolation Panel [NHANES]    Frequency of Communication with Friends and Family: More than three times a week    Frequency of Social Gatherings with Friends and Family: More than three times a week    Attends Religious Services: More than 4 times per year    Active Member of [employer] or Organizations: No    Attends Banker Meetings: Never    Marital Status: Widowed  Intimate Partner Violence: Not At Risk (04/23/2023)   Humiliation, Afraid, Rape, and Kick questionnaire    Fear of Current or Ex-Partner: No    Emotionally Abused: No    Physically Abused: No    Sexually Abused: No    Family History  Problem Relation Age of Onset   CAD Father    Heart failure Sister    CAD Daughter    Heart disease Daughter       Review of Systems Constitutional: negative for anorexia, fevers and sweats  Eyes: negative for irritation, redness and visual disturbance  Ears, nose, mouth, throat, and face: negative for earaches, epistaxis, nasal congestion and sore throat  Respiratory: negative for cough, sputum and wheezing  Cardiovascular: negative for chest pain, orthopnea, palpitations and syncope   Gastrointestinal: negative for abdominal pain, constipation, diarrhea, melena, nausea and vomiting  Genitourinary:negative for dysuria, frequency and hematuria  Hematologic/lymphatic: negative for bleeding, easy bruising and lymphadenopathy  Musculoskeletal:negative for arthralgias, muscle weakness and stiff joints  Neurological: negative for coordination problems, gait problems, headaches and weakness  Endocrine: negative for diabetic symptoms including polydipsia, polyuria and weight loss     Objective:   Physical Exam  Gen. Pleasant, elderly,well-nourished, in no distress, normal affect ENT - no pallor,icterus, no post nasal drip, class 2 airway Neck: No JVD, no thyromegaly, no carotid bruits Lungs: no use of accessory muscles,  no dullness to percussion, clear without rales or rhonchi  Cardiovascular: Rhythm regular, heart sounds  normal, no murmurs or gallops, no peripheral edema Abdomen: soft and non-tender, no hepatosplenomegaly, BS normal. Musculoskeletal: No deformities, no cyanosis or clubbing Neuro:  alert, non focal       Assessment & Plan:

## 2023-08-10 DIAGNOSIS — E1140 Type 2 diabetes mellitus with diabetic neuropathy, unspecified: Secondary | ICD-10-CM | POA: Diagnosis not present

## 2023-08-10 DIAGNOSIS — E1151 Type 2 diabetes mellitus with diabetic peripheral angiopathy without gangrene: Secondary | ICD-10-CM | POA: Diagnosis not present

## 2023-09-01 ENCOUNTER — Emergency Department (HOSPITAL_COMMUNITY)
Admission: EM | Admit: 2023-09-01 | Discharge: 2023-09-01 | Disposition: A | Discharge status: Home / Self Care | Payer: Medicare Other | Attending: Student | Admitting: Student

## 2023-09-01 ENCOUNTER — Encounter (HOSPITAL_COMMUNITY): Payer: Self-pay | Admitting: *Deleted

## 2023-09-01 ENCOUNTER — Other Ambulatory Visit: Payer: Self-pay

## 2023-09-01 ENCOUNTER — Emergency Department (HOSPITAL_COMMUNITY): Payer: Medicare Other

## 2023-09-01 DIAGNOSIS — Z79899 Other long term (current) drug therapy: Secondary | ICD-10-CM | POA: Diagnosis not present

## 2023-09-01 DIAGNOSIS — R079 Chest pain, unspecified: Secondary | ICD-10-CM | POA: Diagnosis present

## 2023-09-01 DIAGNOSIS — I129 Hypertensive chronic kidney disease with stage 1 through stage 4 chronic kidney disease, or unspecified chronic kidney disease: Secondary | ICD-10-CM | POA: Insufficient documentation

## 2023-09-01 DIAGNOSIS — M25511 Pain in right shoulder: Secondary | ICD-10-CM | POA: Diagnosis not present

## 2023-09-01 DIAGNOSIS — N189 Chronic kidney disease, unspecified: Secondary | ICD-10-CM | POA: Insufficient documentation

## 2023-09-01 DIAGNOSIS — I2510 Atherosclerotic heart disease of native coronary artery without angina pectoris: Secondary | ICD-10-CM | POA: Diagnosis not present

## 2023-09-01 DIAGNOSIS — E1122 Type 2 diabetes mellitus with diabetic chronic kidney disease: Secondary | ICD-10-CM | POA: Insufficient documentation

## 2023-09-01 DIAGNOSIS — Z7984 Long term (current) use of oral hypoglycemic drugs: Secondary | ICD-10-CM | POA: Diagnosis not present

## 2023-09-01 DIAGNOSIS — Z951 Presence of aortocoronary bypass graft: Secondary | ICD-10-CM | POA: Insufficient documentation

## 2023-09-01 DIAGNOSIS — I441 Atrioventricular block, second degree: Secondary | ICD-10-CM | POA: Diagnosis not present

## 2023-09-01 DIAGNOSIS — J449 Chronic obstructive pulmonary disease, unspecified: Secondary | ICD-10-CM | POA: Insufficient documentation

## 2023-09-01 DIAGNOSIS — Z7982 Long term (current) use of aspirin: Secondary | ICD-10-CM | POA: Insufficient documentation

## 2023-09-01 DIAGNOSIS — R0789 Other chest pain: Secondary | ICD-10-CM | POA: Insufficient documentation

## 2023-09-01 LAB — COMPREHENSIVE METABOLIC PANEL
ALT: 21 U/L (ref 0–44)
AST: 20 U/L (ref 15–41)
Albumin: 3.5 g/dL (ref 3.5–5.0)
Alkaline Phosphatase: 46 U/L (ref 38–126)
Anion gap: 7 (ref 5–15)
BUN: 28 mg/dL — ABNORMAL HIGH (ref 8–23)
CO2: 25 mmol/L (ref 22–32)
Calcium: 8.6 mg/dL — ABNORMAL LOW (ref 8.9–10.3)
Chloride: 105 mmol/L (ref 98–111)
Creatinine, Ser: 1.2 mg/dL — ABNORMAL HIGH (ref 0.44–1.00)
GFR, Estimated: 45 mL/min — ABNORMAL LOW (ref >60)
Glucose, Bld: 145 mg/dL — ABNORMAL HIGH (ref 70–99)
Potassium: 4.5 mmol/L (ref 3.5–5.1)
Sodium: 137 mmol/L (ref 135–145)
Total Bilirubin: 0.5 mg/dL (ref 0.3–1.2)
Total Protein: 7 g/dL (ref 6.5–8.1)

## 2023-09-01 LAB — CBC WITH DIFFERENTIAL/PLATELET
Abs Immature Granulocytes: 0.01 10*3/uL (ref 0.00–0.07)
Basophils Absolute: 0 10*3/uL (ref 0.0–0.1)
Basophils Relative: 1 %
Eosinophils Absolute: 0.2 10*3/uL (ref 0.0–0.5)
Eosinophils Relative: 3 %
HCT: 33.8 % — ABNORMAL LOW (ref 36.0–46.0)
Hemoglobin: 11.6 g/dL — ABNORMAL LOW (ref 12.0–15.0)
Immature Granulocytes: 0 %
Lymphocytes Relative: 26 %
Lymphs Abs: 2.1 10*3/uL (ref 0.7–4.0)
MCH: 32.1 pg (ref 26.0–34.0)
MCHC: 34.3 g/dL (ref 30.0–36.0)
MCV: 93.6 fL (ref 80.0–100.0)
Monocytes Absolute: 0.7 10*3/uL (ref 0.1–1.0)
Monocytes Relative: 9 %
Neutro Abs: 5 10*3/uL (ref 1.7–7.7)
Neutrophils Relative %: 61 %
Platelets: 190 10*3/uL (ref 150–400)
RBC: 3.61 MIL/uL — ABNORMAL LOW (ref 3.87–5.11)
RDW: 12.2 % (ref 11.5–15.5)
WBC: 8 10*3/uL (ref 4.0–10.5)
nRBC: 0 % (ref 0.0–0.2)

## 2023-09-01 LAB — TROPONIN I (HIGH SENSITIVITY)
Troponin I (High Sensitivity): 8 ng/L (ref <18)
Troponin I (High Sensitivity): 9 ng/L (ref <18)

## 2023-09-01 MED ORDER — ACETAMINOPHEN 500 MG PO TABS
1000.0000 mg | ORAL_TABLET | Freq: Once | ORAL | Status: AC
  Administered 2023-09-01: 1000 mg via ORAL
  Filled 2023-09-01: qty 2

## 2023-09-01 MED ORDER — LIDOCAINE 5 % EX PTCH
1.0000 | MEDICATED_PATCH | CUTANEOUS | Status: DC
  Administered 2023-09-01: 1 via TRANSDERMAL
  Filled 2023-09-01: qty 1

## 2023-09-01 NOTE — ED Provider Notes (Signed)
[place] EMERGENCY DEPARTMENT AT [facility] Provider Note  CSN: [id number] Arrival date & time: 09/01/23 1651  Chief Complaint(s) Chest Pain  HPI [name] [name] is a 82 y.o. female with PMH CAD status post CABG, known intermittent bradycardia with second-degree Mobitz 1 winky block, HTN, T2DM, GERD, CKD, depression, anxiety who presents emergency room for evaluation of chest and back pain.  Patient states that she awoke early this morning with a pain near her right shoulder blade.  States pain is worse when reaching up with the arm.  Denies associated shortness of breath, nausea, vomiting, diaphoresis, syncope or any other systemic symptoms.   Past Medical History Past Medical History:  Diagnosis Date   Anxiety    CAD (coronary artery disease)    Multivessel status post CABG June 2021 - LIMA to LAD, SVG to distal RCA, SVG to OM1   Chronic obstructive pulmonary disease, unspecified (HCC)    Depression    Essential hypertension    GERD    History of pneumonia    Incarcerated ventral hernia    Osteoarthritis    Type 2 diabetes mellitus (HCC)    Patient Active Problem List   Diagnosis Date Noted   OSA (obstructive sleep apnea) 07/31/2023   Essential hypertension 06/08/2023   Coronary artery disease 06/08/2023   Elevated troponin 04/24/2023   Tick bite of lower leg 04/24/2023   Bronchitis 02/18/2023   PAD (peripheral artery disease) (HCC) 08/21/2022   Overweight (BMI 25.0-29.9) 08/21/2022   Right hip pain 08/21/2022   Pain of right thumb 11/27/2021   TIA (transient ischemic attack) 07/01/2021   Chest pain 06/30/2021   Medication management 12/12/2020   Mixed hyperlipidemia 11/27/2020   Left lower quadrant abdominal mass 11/27/2020   Abdominal wall mass of left flank 11/27/2020   Acute pain of left shoulder 10/03/2020   Joint stiffness of hand, left 10/03/2020   Gastro-esophageal reflux disease with esophagitis    Major depressive disorder, single episode,  unspecified    Unspecified osteoarthritis, unspecified site    Atherosclerotic heart disease of native coronary artery without angina pectoris    Chronic kidney disease, stage 3b (HCC)    Hypertensive heart disease without heart failure    Type 2 diabetes mellitus with diabetic nephropathy (HCC)    Ventral hernia without obstruction or gangrene 09/01/2017   Incarcerated ventral hernia    Home Medication(s) Prior to Admission medications   Medication Sig Start Date End Date Taking? Authorizing Provider  Accu-Chek Softclix Lancets lancets Use as instructed 02/12/21   Dani Gobble, NP  acetaminophen (TYLENOL) 325 MG tablet Take 650 mg by mouth every 6 (six) hours as needed for moderate pain.    [provider]  aspirin EC 81 MG tablet Take 1 tablet (81 mg total) by mouth daily. Swallow whole. 12/25/20   Jonelle Sidle, MD  atorvastatin (LIPITOR) 40 MG tablet TAKE 1 TABLET BY MOUTH AT BEDTIME. 05/04/23   Jonelle Sidle, MD  blood glucose meter kit and supplies Dispense based on patient and insurance preference. Use up to four times daily as directed. (FOR ICD-10 E10.9, E11.9). 01/08/23   Dani Gobble, NP  cetirizine (ZYRTEC) 10 MG tablet Take 1 tablet (10 mg total) by mouth daily as needed (allergies). 06/17/23   Billie Lade, MD  famotidine (PEPCID) 20 MG tablet Take 1 tablet (20 mg total) by mouth daily. 04/25/23   Vassie Loll, MD  fish oil-omega-3 fatty acids 1000 MG capsule Take 1 g  by mouth daily.    [provider]  glucose blood (ACCU-CHEK GUIDE) test strip Use as instructed to monitor glucose twice daily 12/04/21   Dani Gobble, NP  glucose blood test strip Use as instructed to monitor glucose twice daily 08/28/21   Dani Gobble, NP  guaiFENesin-dextromethorphan (ROBITUSSIN DM) 100-10 MG/5ML syrup Take 5 mLs by mouth every 4 (four) hours as needed for cough. 10/27/22   Billie Lade, MD  linagliptin (TRADJENTA) 5 MG TABS tablet Take 1  tablet by mouth once daily 07/06/23   Dani Gobble, NP  lisinopril (ZESTRIL) 5 MG tablet Take 1 tablet (5 mg total) by mouth daily. 06/10/23   Billie Lade, MD  metFORMIN (GLUCOPHAGE) 500 MG tablet Take 1 tablet (500 mg total) by mouth daily with breakfast. 06/04/23   Dani Gobble, NP  Multiple Vitamin (MULITIVITAMIN WITH MINERALS) TABS Take 1 tablet by mouth daily.    [provider]  nitroGLYCERIN (NITROSTAT) 0.4 MG SL tablet Place 1 tablet (0.4 mg total) under the tongue every 5 (five) minutes x 3 doses as needed for chest pain (if no relief after 3rd dose, proceed to the ED or call 911). 04/07/23   Sharlene Dory, NP  polyethylene glycol (MIRALAX / GLYCOLAX) 17 g packet Take 17 g by mouth daily as needed for mild constipation or moderate constipation. 12/01/22   Billie Lade, MD                                                                                                                                    Past Surgical History Past Surgical History:  Procedure Laterality Date   CATARACT EXTRACTION Bilateral 2023   CORONARY ARTERY BYPASS GRAFT N/A 05/22/2020   Procedure: CORONARY ARTERY BYPASS GRAFTING (CABG) x3 LIMA TO LAD, SVG TO OM1, SVG TO DISTAL RIGHT;  Surgeon: Corliss Skains, MD;  Location: [facility] OR;  Service: Open Heart Surgery;  Laterality: N/A;   LEFT HEART CATH AND CORONARY ANGIOGRAPHY N/A 05/21/2020   Procedure: LEFT HEART CATH AND CORONARY ANGIOGRAPHY;  Surgeon: [place], Peter M, MD;  Location: [facility]VAS[facility] CV LAB;  Service: Cardiovascular;  Laterality: N/A;   NEPHRECTOMY     TEE WITHOUT CARDIOVERSION N/A 05/22/2020   Procedure: TRANSESOPHAGEAL ECHOCARDIOGRAM (TEE);  Surgeon: Corliss Skains, MD;  Location: [facility] OR;  Service: Open Heart Surgery;  Laterality: N/A;   VEIN HARVEST Right 05/22/2020   Procedure: Open Vein Harvest;  Surgeon: Corliss Skains, MD;  Location: [facility] OR;  Service: Open Heart Surgery;  Laterality: Right;   VENTRAL HERNIA  REPAIR  09/01/2017   mistaken entry   Family History Family History  Problem Relation Age of Onset   CAD Father    Heart failure Sister    CAD Daughter    Heart disease Daughter     Social History Social History   Tobacco Use   Smoking status: Never  Passive exposure: Never   Smokeless tobacco: Never  Vaping Use   Vaping status: Never Used  Substance Use Topics   Alcohol use: No   Drug use: No   Allergies Iodinated contrast media, Codeine, Morphine and codeine, Other, and Penicillins  Review of Systems Review of Systems  Cardiovascular:  Positive for chest pain.    Physical Exam Vital Signs  I have reviewed the triage vital signs BP 128/67   Pulse (!) 47   Temp 98.2 F (36.8 C)   Resp (!) 22   Ht 4\' 11"  (1.499 m)   Wt 65.3 kg   SpO2 95%   BMI 29.08 kg/m   Physical Exam Vitals and nursing note reviewed.  Constitutional:      General: She is not in acute distress.    Appearance: She is well-developed.  HENT:     Head: Normocephalic and atraumatic.  Eyes:     Conjunctiva/sclera: Conjunctivae normal.  Cardiovascular:     Rate and Rhythm: Normal rate and regular rhythm.     Heart sounds: No murmur heard. Pulmonary:     Effort: Pulmonary effort is normal. No respiratory distress.     Breath sounds: Normal breath sounds.  Abdominal:     Palpations: Abdomen is soft.     Tenderness: There is no abdominal tenderness.  Musculoskeletal:        General: No swelling.     Cervical back: Neck supple.  Skin:    General: Skin is warm and dry.     Capillary Refill: Capillary refill takes less than 2 seconds.  Neurological:     Mental Status: She is alert.  Psychiatric:        Mood and Affect: Mood normal.     ED Results and Treatments Labs (all labs ordered are listed, but only abnormal results are displayed) Labs Reviewed  COMPREHENSIVE METABOLIC PANEL - Abnormal; Notable for the following components:      Result Value   Glucose, Bld 145 (*)    BUN  28 (*)    Creatinine, Ser 1.20 (*)    Calcium 8.6 (*)    GFR, Estimated 45 (*)    All other components within normal limits  CBC WITH DIFFERENTIAL/PLATELET - Abnormal; Notable for the following components:   RBC 3.61 (*)    Hemoglobin 11.6 (*)    HCT 33.8 (*)    All other components within normal limits  TROPONIN I (HIGH SENSITIVITY)  TROPONIN I (HIGH SENSITIVITY)                                                                                                                          Radiology DG Chest 2 View  Result Date: 09/01/2023 CLINICAL DATA:  Right chest wall pain since this morning EXAM: CHEST - 2 VIEW COMPARISON:  04/23/2023 FINDINGS: Frontal and lateral views of the chest demonstrates stable postsurgical changes from CABG. Cardiac silhouette is unremarkable. No acute airspace disease, effusion, or pneumothorax. No acute  bony abnormalities. IMPRESSION: 1. No acute intrathoracic process. Electronically Signed   By: Sharlet Salina M.D.   On: 09/01/2023 19:51    Pertinent labs & imaging results that were available during my care of the patient were reviewed by me and considered in my medical decision making (see MDM for details).  Medications Ordered in ED Medications  lidocaine (LIDODERM) 5 % 1 patch (1 patch Transdermal Patch Applied 09/01/23 2055)  acetaminophen (TYLENOL) tablet 1,000 mg (1,000 mg Oral Given 09/01/23 2035)                                                                                                                                     Procedures Procedures  (including critical care time)  Medical Decision Making / ED Course   This patient presents to the ED for concern of chest pain, this involves an extensive number of treatment options, and is a complaint that carries with it a high risk of complications and morbidity.  The differential diagnosis includes ACS, Aortic Dissection, Pneumothorax, Pneumonia, Esophageal Rupture, PE, Tamponade/Pericardial  Effusion, pericarditis, esophageal spasm, dysrhythmia, GERD, costochondritis.  MDM: Patient seen emergency room for evaluation of chest pain.  Physical exam revealing reproducible tenderness under the shoulder blade on the right and tenderness in the same area with range of motion of the right shoulder.  Cardiopulmonary exam otherwise unremarkable outside of an irregular bradycardia.  Laboratory evaluation with a hemoglobin of 11.6, BUN 28, creatinine 1.20, troponin high-sensitivity delta troponin reassuringly unremarkable.  ECG consistent with Mobitz 1 heart block that has been well-documented in previous cardiology notes.  She has no associated shortness of breath or syncope and low suspicion that this is symptomatic today.  Chest x-ray reassuringly unremarkable.  Patient symptoms were improved with lidocaine patch and Tylenol and I have suspicion that symptoms today are musculoskeletal in nature.  With negative troponins, nonischemic EKG, no exertional component to the patient's symptoms, no associated shortness of breath I have overall lower suspicion for ACS.  At this time she does not meet inpatient criteria for admission but I did place an outpatient cardiology referral and encouraged the patient to follow-up with her cardiologist.  Patient discharged with outpatient follow-up and return precautions of which she voiced understanding.  Additional history obtained: -Additional history obtained from daughter -External records from outside source obtained and reviewed including: Chart review including previous notes, labs, imaging, consultation notes   Lab Tests: -I ordered, reviewed, and interpreted labs.   The pertinent results include:   Labs Reviewed  COMPREHENSIVE METABOLIC PANEL - Abnormal; Notable for the following components:      Result Value   Glucose, Bld 145 (*)    BUN 28 (*)    Creatinine, Ser 1.20 (*)    Calcium 8.6 (*)    GFR, Estimated 45 (*)    All other components within  normal limits  CBC WITH DIFFERENTIAL/PLATELET - Abnormal; Notable for the  following components:   RBC 3.61 (*)    Hemoglobin 11.6 (*)    HCT 33.8 (*)    All other components within normal limits  TROPONIN I (HIGH SENSITIVITY)  TROPONIN I (HIGH SENSITIVITY)      EKG   EKG Interpretation Date/Time:  Tuesday September 01 2023 17:06:10 EDT Ventricular Rate:  54 PR Interval:    QRS Duration:  88 QT Interval:  438 QTC Calculation: 415 R Axis:   94  Text Interpretation: Sinus rhythm with 2nd degree A-V block (Mobitz I) Rightward axis Low voltage QRS Septal infarct (cited on or before 25-Sep-2022) Abnormal ECG When compared with ECG of 23-Apr-2023 14:15, Sinus rhythm is now with 2nd degree A-V block (Mobitz I) Vent. rate has decreased BY  26 BPM T wave inversion no longer evident in Inferior leads T wave inversion no longer evident in Lateral leads Confirmed by Chriss Mannan ([id number]) on 09/01/2023 5:39:21 PM         Imaging Studies ordered: I ordered imaging studies including chest x-ray I independently visualized and interpreted imaging. I agree with the radiologist interpretation   Medicines ordered and prescription drug management: Meds ordered this encounter  Medications   acetaminophen (TYLENOL) tablet 1,000 mg   lidocaine (LIDODERM) 5 % 1 patch    -I have reviewed the patients home medicines and have made adjustments as needed  Critical interventions none   Cardiac Monitoring: The patient was maintained on a cardiac monitor.  I personally viewed and interpreted the cardiac monitored which showed an underlying rhythm of: Second-degree Mobitz 1  Social Determinants of Health:  Factors impacting patients care include: none   Reevaluation: After the interventions noted above, I reevaluated the patient and found that they have :improved  Co morbidities that complicate the patient evaluation  Past Medical History:  Diagnosis Date   Anxiety    CAD (coronary artery  disease)    Multivessel status post CABG June 2021 - LIMA to LAD, SVG to distal RCA, SVG to OM1   Chronic obstructive pulmonary disease, unspecified (HCC)    Depression    Essential hypertension    GERD    History of pneumonia    Incarcerated ventral hernia    Osteoarthritis    Type 2 diabetes mellitus (HCC)       Dispostion: I considered admission for this patient, but at this time she does not meet inpatient criteria for admission and she is safe for discharge with outpatient follow-up     Final Clinical Impression(s) / ED Diagnoses Final diagnoses:  Acute pain of right shoulder  Atypical chest pain  Wenckebach's phenomenon, heart block     @PCDICTATION @    Danilyn Cocke, Wyn Forster, MD 09/02/23 0015

## 2023-09-01 NOTE — ED Triage Notes (Addendum)
Pt with right chest pain since waking up this morning.  Pt also with toenail detaching to right great toe. Pt states pain with movement and to back as well.

## 2023-09-02 ENCOUNTER — Other Ambulatory Visit: Payer: Self-pay | Admitting: Internal Medicine

## 2023-09-02 ENCOUNTER — Other Ambulatory Visit: Payer: Self-pay | Admitting: Cardiology

## 2023-09-03 ENCOUNTER — Other Ambulatory Visit: Payer: Self-pay | Admitting: Nurse Practitioner

## 2023-09-07 DIAGNOSIS — M79671 Pain in right foot: Secondary | ICD-10-CM | POA: Diagnosis not present

## 2023-09-07 DIAGNOSIS — L600 Ingrowing nail: Secondary | ICD-10-CM | POA: Diagnosis not present

## 2023-09-07 DIAGNOSIS — M79674 Pain in right toe(s): Secondary | ICD-10-CM | POA: Diagnosis not present

## 2023-09-08 ENCOUNTER — Telehealth: Payer: Self-pay | Admitting: Pulmonary Disease

## 2023-09-08 NOTE — Telephone Encounter (Signed)
Patient's daughter, [name] [name] called and states someone from the "sleep study place" called her and stated they needed a debit card number before they would mail the sleep study testing equipment to the patient.  [name] [name] states neither she or her mother have a debit card and she feels this may have been a Transport planner.  Phone [phone] or [phone].  Please advise

## 2023-09-09 NOTE — Telephone Encounter (Signed)
Called [employer], patient has been approved for study without card on file. Called patient and notified them. Provided patient with [employer]NAP nu,mber to call and get SNAP box sent to them.

## 2023-09-10 ENCOUNTER — Encounter: Payer: Self-pay | Admitting: Internal Medicine

## 2023-09-10 ENCOUNTER — Ambulatory Visit: Payer: Medicare Other | Admitting: Internal Medicine

## 2023-09-10 VITALS — BP 164/71 | HR 80 | Ht 59.0 in | Wt 146.2 lb

## 2023-09-10 DIAGNOSIS — E782 Mixed hyperlipidemia: Secondary | ICD-10-CM

## 2023-09-10 DIAGNOSIS — I10 Essential (primary) hypertension: Secondary | ICD-10-CM | POA: Diagnosis not present

## 2023-09-10 DIAGNOSIS — E1121 Type 2 diabetes mellitus with diabetic nephropathy: Secondary | ICD-10-CM

## 2023-09-10 DIAGNOSIS — N1832 Chronic kidney disease, stage 3b: Secondary | ICD-10-CM

## 2023-09-10 DIAGNOSIS — Z7984 Long term (current) use of oral hypoglycemic drugs: Secondary | ICD-10-CM

## 2023-09-10 DIAGNOSIS — I25119 Atherosclerotic heart disease of native coronary artery with unspecified angina pectoris: Secondary | ICD-10-CM

## 2023-09-10 NOTE — Patient Instructions (Signed)
It was a pleasure to see you today.  Thank you for giving [place] the opportunity to be involved in your care.  Below is a brief recap of your visit and next steps.  We will plan to see you again in 3 months.  Summary No medication changes today I recommend resuming lisinopril as previously prescribed. You can discuss this with Dr. B and cardiology at follow up next month. Follow up with me in 3 months

## 2023-09-10 NOTE — Assessment & Plan Note (Signed)
BP is elevated today.  She was prescribed lisinopril 5 mg daily following her last appointment in the setting of CKD and microalbuminuria.  Reports today that she is not taking it due to concern that it lowers her heart rate. -I have recommended that she resume lisinopril 5 mg daily as previously prescribed

## 2023-09-10 NOTE — Assessment & Plan Note (Signed)
Followed by nephrology (Dr. Wolfgang Phoenix).  Lisinopril 5 mg daily was started at her last appointment.  I have advised that she resume this medication for renal protection in the setting of CKD and diabetes mellitus.  She plans to further discuss this with Dr. Wolfgang Phoenix at follow-up next month.

## 2023-09-10 NOTE — Assessment & Plan Note (Signed)
Lipid panel updated in May.  Total cholesterol 100 and LDL 43.  She is currently prescribed atorvastatin 40 mg daily.  No medication changes are indicated at this time.

## 2023-09-10 NOTE — Assessment & Plan Note (Signed)
History of CABG x 3 in June 2021.  Followed by cardiology.  Recent hospital presentation for chest pain.  Cardiac workup reassuring.  Pain was ultimately attributed to musculoskeletal etiology.  She is scheduled for follow-up with cardiology next month.  Remains on ASA and statin therapy.

## 2023-09-10 NOTE — Assessment & Plan Note (Signed)
Followed by endocrinology.  A1c 6.9 in June.  She is currently prescribed metformin 500 mg daily and Tradjenta 5 mg daily. -Endocrinology follow-up is scheduled for next month

## 2023-09-10 NOTE — Progress Notes (Signed)
Established Patient Office Visit  Subjective   Patient ID: [name] [name], female    DOB: 04/23/1941  Age: 82 y.o. MRN: [id number]  Chief Complaint  Patient presents with   Follow-up    Follow up   [name] [name] returns to care today for routine follow-up.  She was last evaluated by me on 6/24.  No medication changes were made at that time and 24-month follow-up was arranged.  In the interim, she has presented to the emergency department on 3 occasions.  She presented initially on 8/6 for pain around her hernia site.  Treated for abdominal wall cellulitis.  She returned to the ED on 8/10 for a wound check.  Most recently, she presented to the emergency department on 9/17 endorsing chest pain.  Cardiac workup was reassuring.  Symptoms were attributed to musculoskeletal etiology. Evaluated by pulmonology on 8/16 for severe OSA.  [name] [name] reports feeling well today.  She is asymptomatic and has no acute concerns to discuss.  Past Medical History:  Diagnosis Date   Anxiety    CAD (coronary artery disease)    Multivessel status post CABG June 2021 - LIMA to LAD, SVG to distal RCA, SVG to OM1   Chronic obstructive pulmonary disease, unspecified (HCC)    Depression    Essential hypertension    GERD    History of pneumonia    Incarcerated ventral hernia    Osteoarthritis    Type 2 diabetes mellitus (HCC)    Past Surgical History:  Procedure Laterality Date   CATARACT EXTRACTION Bilateral 2023   CORONARY ARTERY BYPASS GRAFT N/A 05/22/2020   Procedure: CORONARY ARTERY BYPASS GRAFTING (CABG) x3 LIMA TO LAD, SVG TO OM1, SVG TO DISTAL RIGHT;  Surgeon: Corliss Skains, MD;  Location: [facility] OR;  Service: Open Heart Surgery;  Laterality: N/A;   LEFT HEART CATH AND CORONARY ANGIOGRAPHY N/A 05/21/2020   Procedure: LEFT HEART CATH AND CORONARY ANGIOGRAPHY;  Surgeon: [place], Peter M, MD;  Location: [facility] INVASIVE CV LAB;  Service: Cardiovascular;  Laterality: N/A;   NEPHRECTOMY     TEE WITHOUT  CARDIOVERSION N/A 05/22/2020   Procedure: TRANSESOPHAGEAL ECHOCARDIOGRAM (TEE);  Surgeon: Corliss Skains, MD;  Location: [facility] OR;  Service: Open Heart Surgery;  Laterality: N/A;   VEIN HARVEST Right 05/22/2020   Procedure: Open Vein Harvest;  Surgeon: Corliss Skains, MD;  Location: [facility] OR;  Service: Open Heart Surgery;  Laterality: Right;   VENTRAL HERNIA REPAIR  09/01/2017   mistaken entry   Social History   Tobacco Use   Smoking status: Never    Passive exposure: Never   Smokeless tobacco: Never  Vaping Use   Vaping status: Never Used  Substance Use Topics   Alcohol use: No   Drug use: No   Family History  Problem Relation Age of Onset   CAD Father    Heart failure Sister    CAD Daughter    Heart disease Daughter    Allergies  Allergen Reactions   Iodinated Contrast Media Other (See Comments)    Unknown reaction. Due to kidney issues. Do not use Unknown reaction. Due to kidney issues. Do not use   Codeine Rash   Morphine And Codeine Other (See Comments) and Rash    Unknown Reaction Unknown Reaction   Other     Pt has multiple allergies per pt but unsure of all.    Penicillins Other (See Comments)    Unknown reaction Unknown reaction   Review of Systems  Constitutional:  Negative for chills and fever.  HENT:  Negative for sore throat.   Respiratory:  Negative for cough and shortness of breath.   Cardiovascular:  Negative for chest pain, palpitations and leg swelling.  Gastrointestinal:  Negative for abdominal pain, blood in stool, constipation, diarrhea, nausea and vomiting.  Genitourinary:  Negative for dysuria and hematuria.  Musculoskeletal:  Negative for myalgias.  Skin:  Negative for itching and rash.  Neurological:  Negative for dizziness and headaches.  Psychiatric/Behavioral:  Negative for depression and suicidal ideas.      Objective:     BP (!) 164/71 (BP Location: Right Arm, Patient Position: Sitting, Cuff Size: Large)   Pulse 80   Ht 4'  11" (1.499 m)   Wt 146 lb 3.2 oz (66.3 kg)   SpO2 94%   BMI 29.53 kg/m  BP Readings from Last 3 Encounters:  09/10/23 (!) 164/71  09/01/23 128/67  07/31/23 131/71   Physical Exam Vitals reviewed.  Constitutional:      General: She is not in acute distress.    Appearance: Normal appearance. She is not toxic-appearing.  HENT:     Head: Normocephalic and atraumatic.     Right Ear: External ear normal.     Left Ear: External ear normal.     Nose: Nose normal. No congestion or rhinorrhea.     Mouth/Throat:     Mouth: Mucous membranes are moist.     Pharynx: Oropharynx is clear. No oropharyngeal exudate or posterior oropharyngeal erythema.  Eyes:     General: No scleral icterus.    Extraocular Movements: Extraocular movements intact.     Conjunctiva/sclera: Conjunctivae normal.     Pupils: Pupils are equal, round, and reactive to light.  Cardiovascular:     Rate and Rhythm: Normal rate and regular rhythm.     Pulses: Normal pulses.     Heart sounds: Normal heart sounds. No murmur heard.    No friction rub. No gallop.  Pulmonary:     Effort: Pulmonary effort is normal.     Breath sounds: Normal breath sounds. No wheezing, rhonchi or rales.  Abdominal:     General: Abdomen is flat. Bowel sounds are normal. There is no distension.     Palpations: Abdomen is soft.     Tenderness: There is no abdominal tenderness.     Hernia: A hernia (Multiple abdominal wall hernias) is present.  Musculoskeletal:        General: No swelling. Normal range of motion.     Cervical back: Normal range of motion.     Right lower leg: No edema.     Left lower leg: No edema.  Lymphadenopathy:     Cervical: No cervical adenopathy.  Skin:    General: Skin is warm and dry.     Capillary Refill: Capillary refill takes less than 2 seconds.     Coloration: Skin is not jaundiced.  Neurological:     General: No focal deficit present.     Mental Status: She is alert and oriented to person, place, and time.   Psychiatric:        Mood and Affect: Mood normal.        Behavior: Behavior normal.   Last CBC Lab Results  Component Value Date   WBC 8.0 09/01/2023   HGB 11.6 (L) 09/01/2023   HCT 33.8 (L) 09/01/2023   MCV 93.6 09/01/2023   MCH 32.1 09/01/2023   RDW 12.2 09/01/2023   PLT 190 09/01/2023  Last metabolic panel Lab Results  Component Value Date   GLUCOSE 145 (H) 09/01/2023   NA 137 09/01/2023   K 4.5 09/01/2023   CL 105 09/01/2023   CO2 25 09/01/2023   BUN 28 (H) 09/01/2023   CREATININE 1.20 (H) 09/01/2023   GFRNONAA 45 (L) 09/01/2023   CALCIUM 8.6 (L) 09/01/2023   PHOS 3.7 06/23/2023   PROT 7.0 09/01/2023   ALBUMIN 3.5 09/01/2023   LABGLOB 2.3 11/21/2021   AGRATIO 2.0 11/21/2021   BILITOT 0.5 09/01/2023   ALKPHOS 46 09/01/2023   AST 20 09/01/2023   ALT 21 09/01/2023   ANIONGAP 7 09/01/2023   Last lipids Lab Results  Component Value Date   CHOL 100 04/15/2023   HDL 37 (L) 04/15/2023   LDLCALC 43 04/15/2023   TRIG 102 04/15/2023   CHOLHDL 2.7 04/15/2023   Last hemoglobin A1c Lab Results  Component Value Date   HGBA1C 6.9 (A) 06/04/2023   Last thyroid functions Lab Results  Component Value Date   TSH 2.469 05/21/2022   Last vitamin D Lab Results  Component Value Date   VD25OH 51.37 06/23/2023     Assessment & Plan:   Problem List Items Addressed This Visit       Essential hypertension - Primary    BP is elevated today.  She was prescribed lisinopril 5 mg daily following her last appointment in the setting of CKD and microalbuminuria.  Reports today that she is not taking it due to concern that it lowers her heart rate. -I have recommended that she resume lisinopril 5 mg daily as previously prescribed      Coronary artery disease    History of CABG x 3 in June 2021.  Followed by cardiology.  Recent hospital presentation for chest pain.  Cardiac workup reassuring.  Pain was ultimately attributed to musculoskeletal etiology.  She is scheduled for  follow-up with cardiology next month.  Remains on ASA and statin therapy.      Type 2 diabetes mellitus with diabetic nephropathy (HCC)    Followed by endocrinology.  A1c 6.9 in June.  She is currently prescribed metformin 500 mg daily and Tradjenta 5 mg daily. -Endocrinology follow-up is scheduled for next month      Chronic kidney disease, stage 3b (HCC)    Followed by nephrology (Dr. Wolfgang Phoenix).  Lisinopril 5 mg daily was started at her last appointment.  I have advised that she resume this medication for renal protection in the setting of CKD and diabetes mellitus.  She plans to further discuss this with Dr. Wolfgang Phoenix at follow-up next month.      Mixed hyperlipidemia    Lipid panel updated in May.  Total cholesterol 100 and LDL 43.  She is currently prescribed atorvastatin 40 mg daily.  No medication changes are indicated at this time.      Return in about 3 months (around 12/10/2023).   Billie Lade, MD

## 2023-09-23 DIAGNOSIS — G4733 Obstructive sleep apnea (adult) (pediatric): Secondary | ICD-10-CM | POA: Diagnosis not present

## 2023-09-29 ENCOUNTER — Ambulatory Visit: Payer: Medicare Other | Admitting: Cardiology

## 2023-10-01 ENCOUNTER — Other Ambulatory Visit: Payer: Self-pay | Admitting: Internal Medicine

## 2023-10-01 ENCOUNTER — Encounter: Payer: Self-pay | Admitting: Nurse Practitioner

## 2023-10-01 ENCOUNTER — Ambulatory Visit: Payer: Medicare Other | Attending: Nurse Practitioner | Admitting: Nurse Practitioner

## 2023-10-01 VITALS — BP 118/62 | HR 66 | Ht 59.0 in | Wt 147.2 lb

## 2023-10-01 DIAGNOSIS — I10 Essential (primary) hypertension: Secondary | ICD-10-CM | POA: Insufficient documentation

## 2023-10-01 DIAGNOSIS — I441 Atrioventricular block, second degree: Secondary | ICD-10-CM | POA: Diagnosis not present

## 2023-10-01 DIAGNOSIS — E1122 Type 2 diabetes mellitus with diabetic chronic kidney disease: Secondary | ICD-10-CM | POA: Diagnosis not present

## 2023-10-01 DIAGNOSIS — E559 Vitamin D deficiency, unspecified: Secondary | ICD-10-CM | POA: Diagnosis not present

## 2023-10-01 DIAGNOSIS — N189 Chronic kidney disease, unspecified: Secondary | ICD-10-CM | POA: Diagnosis not present

## 2023-10-01 DIAGNOSIS — D638 Anemia in other chronic diseases classified elsewhere: Secondary | ICD-10-CM | POA: Diagnosis not present

## 2023-10-01 DIAGNOSIS — N183 Chronic kidney disease, stage 3 unspecified: Secondary | ICD-10-CM | POA: Diagnosis not present

## 2023-10-01 DIAGNOSIS — E785 Hyperlipidemia, unspecified: Secondary | ICD-10-CM | POA: Diagnosis not present

## 2023-10-01 DIAGNOSIS — I2510 Atherosclerotic heart disease of native coronary artery without angina pectoris: Secondary | ICD-10-CM | POA: Insufficient documentation

## 2023-10-01 DIAGNOSIS — Z905 Acquired absence of kidney: Secondary | ICD-10-CM | POA: Diagnosis not present

## 2023-10-01 DIAGNOSIS — E1129 Type 2 diabetes mellitus with other diabetic kidney complication: Secondary | ICD-10-CM | POA: Diagnosis not present

## 2023-10-01 DIAGNOSIS — N1831 Chronic kidney disease, stage 3a: Secondary | ICD-10-CM | POA: Diagnosis not present

## 2023-10-01 DIAGNOSIS — R001 Bradycardia, unspecified: Secondary | ICD-10-CM | POA: Diagnosis not present

## 2023-10-01 MED ORDER — ATORVASTATIN CALCIUM 40 MG PO TABS: 40.0000 mg | ORAL_TABLET | Freq: Every day | ORAL | 5 refills | Status: DC

## 2023-10-01 MED ORDER — BLOOD PRESSURE MONITOR DEVI: 1.0000 | Freq: Every day | 0 refills | Status: AC

## 2023-10-01 NOTE — Progress Notes (Signed)
Office Visit    Patient Name: [name] [name] Date of Encounter: 10/01/2023 PCP:  Billie Lade, MD [facility]  Cardiologist:  Nona Dell, MD  Advanced Practice Provider:  Sharlene Dory, NP Electrophysiologist:  None   Chief Complaint and HPI    [name] [name] is a 82 y.o. female with a hx of CAD, s/p CABG in 2021, intermittent bradycardia with second degree type 1 and 2:1 AV block, HTN, T2DM, GERD, HLD, CKD, depression, anxiety, and OA, who presents today for scheduled follow-up.    Was seen in ED 05/2022 for evaluation for bradycardia by Dr. Eden Emms. Was noted to have episodic second degree type 1 heart block with episodes of 2:1 AV block. Was not on any AV nodal blockers. Recommended for outpatient monitor.   Last seen by Dr. Diona Browner on 07/03/2022. Denied any syncope or dizziness. 7 day Zio monitor arranged and revealed predominately SR, with episodes of bradycardia, rare PAC's/PVC's, and rare and brief episodes of second-degree type 1 block, there was 3.2 second pause associated with dropped sinus beat, not triggered event. Was recommended based on findings did not need PPM at the time.   Admission to [facility] 04/23/23 - 04/24/23 for chest heaviness, worsening with exertion, denied any shortness of breath.  Reported several tick bites over her body, removed the same day.  Troponins negative for ACS.  EKG was nonacute.  Patient treated with doxycycline empirically.  Echocardiogram showed normal EF, mild LVH, overall benign.  05/15/2023 - Today she presents for follow-up.  Admits to chest heaviness with anxiety/stress.  She states she had an episode when traveling in the car on the way to this office visit.  Was in some minor traffic, got stressed, and noted some chest heaviness. Denies any shortness of breath, palpitations, syncope, presyncope, dizziness, orthopnea, PND, swelling or significant weight changes, acute bleeding, or claudication.   ED visit in  August 2024 for pain around her hernia site.  Patient noticed drainage of white, foul-smelling fluid from the lower hernia.  She was not clinically septic.  CT scan was reassuring.  Was diagnosed with abdominal wall cellulitis and dermatitis.  Was given a prescription for Desitin ointment and Keflex antibiotic.  Presented back to the ED for recheck of her cellulitis a few days later -it was found to be significantly improved.  Skin care was discussed, advised to complete course of antibiotics.  ED visit on September 01, 2023 for chest pain.  PE revealed reproducible tenderness under shoulder blade on the right and tenderness in same area with range of motion of right shoulder.  Cardiopulmonary exam unremarkable outside of irregular bradycardia.  Troponin reassuringly unremarkable.  EKG consistent with Mobitz 1 heart block.  Chest x-ray unremarkable.  Symptoms are improved with lidocaine patch and Tylenol, felt that symptoms were MSK in nature.  Was encouraged to follow-up outpatient with cardiology.  Today she presents for hospital follow-up.  She is doing well. Reports 2 deaths in the family, coping well. Pt is very distracted during interview, difficult historian. Overall doing well from a a cardiac perspective. Denies any chest pain, shortness of breath, palpitations, syncope, presyncope, dizziness, orthopnea, PND, swelling or significant weight changes, acute bleeding, or claudication. Reports she did stop taking Lisinopril on her own after speaking with a family member who said she needed to stop taking this, said it caused her to feel her HR slowing down/sensation of arrhythmia, possible cough.   EKGs/Labs/Other Studies Reviewed:   The following  studies were reviewed today:  EKG:  EKG is not ordered today.    Echocardiogram 04/24/2023:  1. Left ventricular ejection fraction, by estimation, is 65 to 70%. The  left ventricle has normal function. The left ventricle has no regional  wall motion  abnormalities. There is mild left ventricular hypertrophy.  Left ventricular diastolic parameters  are indeterminate.   2. Right ventricular systolic function is normal. The right ventricular  size is normal. There is normal pulmonary artery systolic pressure. The  estimated right ventricular systolic pressure is 24.9 mmHg.   3. Left atrial size was upper normal.   4. The mitral valve is grossly normal. Mild mitral valve regurgitation.   5. The aortic valve is tricuspid. There is mild calcification of the  aortic valve. Aortic valve regurgitation is not visualized. Aortic valve  sclerosis/calcification is present, without any evidence of aortic  stenosis.   6. The inferior vena cava is normal in size with greater than 50%  respiratory variability, suggesting right atrial pressure of 3 mmHg.   Comparison(s): Prior images reviewed side by side. LVEF vigorous at  65-70%.  Cardiac monitor 07/2022: Predominant rhythm is sinus with prolonged PR interval.  Heart rate ranged from 38 bpm up to 108 bpm with average heart rate 61 bpm. There were rare PACs representing less than 1% total beats. There were rare PVCs including ventricular couplets representing less than 1% total beats. Rare and brief episodes of second-degree type I heart block were noted.  There was a 3.2-second pause associated with this as well as a dropped sinus beat.  This was not a patient triggered event.  Echo 06/2021: 1. Left ventricular ejection fraction, by estimation, is 55 to 60%. The  left ventricle has normal function. The left ventricle has no regional  wall motion abnormalities. There is mild left ventricular hypertrophy.  Left ventricular diastolic parameters  were normal.   2. Right ventricular systolic function is normal. The right ventricular  size is normal. There is normal pulmonary artery systolic pressure. The  estimated right ventricular systolic pressure is 16.8 mmHg.   3. The mitral valve is grossly  normal. Trivial mitral valve  regurgitation.   4. The aortic valve is tricuspid. There is mild calcification of the  aortic valve. Aortic valve regurgitation is not visualized. Mild aortic  valve sclerosis is present, with no evidence of aortic valve stenosis.   5. The inferior vena cava is normal in size with greater than 50%  respiratory variability, suggesting right atrial pressure of 3 mmHg.  Review of Systems    All other systems reviewed and are otherwise negative except as noted above.  Physical Exam    VS:  BP 118/62   Pulse 66   Ht 4\' 11"  (1.499 m)   Wt 147 lb 3.2 oz (66.8 kg)   SpO2 98%   BMI 29.73 kg/m  , BMI Body mass index is 29.73 kg/m.  Wt Readings from Last 3 Encounters:  10/01/23 147 lb 3.2 oz (66.8 kg)  09/10/23 146 lb 3.2 oz (66.3 kg)  09/01/23 144 lb (65.3 kg)    GEN: Well nourished, well developed, in no acute distress. HEENT: normal. Neck: Supple, no JVD, carotid bruits, or masses. Cardiac: S1/S2, RRR, Grade 2/6 murmur, no rubs, no gallops. No clubbing, cyanosis, edema.  Radials/PT 2+ and equal bilaterally.  Respiratory:  Respirations regular and unlabored, clear and diminished to auscultation bilaterally. GI: significantly sized hernia located along LLE, has been present since 1975 per patient's report.  Skin: Warm and dry, no rash. Neuro:  Strength and sensation are intact. Psych: Normal affect, talkative, distracted.  Assessment & Plan    CAD, s/p CABG in 2021, HLD Denies any recent chest pain. No indication for ischemic evaluation at this time. LDL 43 04/2023. Continue current medication regimen. Heart healthy diet and regular cardiovascular exercise encouraged. ED precautions discussed.    Intermittent bradycardia with second degree type 1 and 2:1 AV block, bradycardia Previous monitor revealed predominant sinus rhythm with periods of bradycardia, few episodes of brief heart block, longest pause was 3.2 seconds, not associated with triggered event.   Dr. Diona Browner stated there were no findings that would suggest she needed a pacemaker at the time.  Denies any symptoms. Not on any AV nodal blockers. Will continue to monitor. ED precautions discussed.   HTN Blood pressure stable today. Self discontinued Lisinopril. Pt educated regarding lisinopril. Admits she has component of [facility]. Given Rx for BP cuff and given BP log and salty six. Will bring back for BP check and to instruct pt on how to check BP at home.  Continue current medication regimen. Heart healthy diet and regular cardiovascular exercise encouraged.   CKD 3  Most recent labs show stable kidney function. Avoid nephrotoxic agents.  No medication changes at this time.  Encouraged adequate hydration. Continue to follow with PCP.  Disposition: Will provide refill for her. Follow up in 2 months with Nona Dell, MD or APP.  Signed, Sharlene Dory, NP 10/01/2023, 10:56 AM [facility]

## 2023-10-01 NOTE — Patient Instructions (Addendum)
Medication Instructions:  Your physician recommends that you continue on your current medications as directed. Please refer to the Current Medication list given to you today.  Labwork: None  Testing/Procedures: None  Follow-Up: Your physician recommends that you schedule a follow-up appointment in:  1-2 weeks nurse visit for BP check  2 Months   Any Other Special Instructions Will Be Listed Below (If Applicable).   If you need a refill on your cardiac medications before your next appointment, please call your pharmacy.

## 2023-10-02 ENCOUNTER — Telehealth: Payer: Self-pay

## 2023-10-02 NOTE — Telephone Encounter (Signed)
Transition Care Management Unsuccessful Follow-up Telephone Call  Date of discharge and from where:  [facility]  Attempts:  1st Attempt  Reason for unsuccessful TCM follow-up call:  No answer/busy   Lenard Forth [place]  [facility], [facility] Guide, Phone: [phone] Website: [email or web address]

## 2023-10-02 NOTE — Telephone Encounter (Signed)
Transition Care Management Unsuccessful Follow-up Telephone Call  Date of discharge and from where:  [name] [name] 9/17  Attempts:  2nd Attempt  Reason for unsuccessful TCM follow-up call:  No answer/busy   Lenard Forth [place]  [facility], [facility] Guide, Phone: [phone] Website: [email or web address]

## 2023-10-12 ENCOUNTER — Encounter: Payer: Self-pay | Admitting: Nurse Practitioner

## 2023-10-12 ENCOUNTER — Ambulatory Visit (INDEPENDENT_AMBULATORY_CARE_PROVIDER_SITE_OTHER): Payer: Medicare Other | Admitting: Nurse Practitioner

## 2023-10-12 VITALS — BP 138/68 | HR 67 | Ht 59.0 in | Wt 146.0 lb

## 2023-10-12 DIAGNOSIS — Z905 Acquired absence of kidney: Secondary | ICD-10-CM | POA: Diagnosis not present

## 2023-10-12 DIAGNOSIS — Z7984 Long term (current) use of oral hypoglycemic drugs: Secondary | ICD-10-CM | POA: Diagnosis not present

## 2023-10-12 DIAGNOSIS — I10 Essential (primary) hypertension: Secondary | ICD-10-CM

## 2023-10-12 DIAGNOSIS — E559 Vitamin D deficiency, unspecified: Secondary | ICD-10-CM | POA: Diagnosis not present

## 2023-10-12 DIAGNOSIS — E782 Mixed hyperlipidemia: Secondary | ICD-10-CM | POA: Diagnosis not present

## 2023-10-12 DIAGNOSIS — E1121 Type 2 diabetes mellitus with diabetic nephropathy: Secondary | ICD-10-CM

## 2023-10-12 LAB — POCT GLYCOSYLATED HEMOGLOBIN (HGB A1C): Hemoglobin A1C: 7.2 % — AB (ref 4.0–5.6)

## 2023-10-12 MED ORDER — LINAGLIPTIN 5 MG PO TABS: 5.0000 mg | ORAL_TABLET | Freq: Every day | ORAL | 2 refills | Status: DC

## 2023-10-12 MED ORDER — METFORMIN HCL 500 MG PO TABS
500.0000 mg | ORAL_TABLET | Freq: Every day | ORAL | 1 refills | Status: DC
Start: 2023-10-12 — End: 2024-02-16

## 2023-10-12 NOTE — Progress Notes (Signed)
Endocrinology Follow Up Note       10/12/2023, 12:44 PM   Subjective:    Patient ID: [name] [name], female    DOB: February 24, 1941.  [name] [name] is being seen in follow up after being seen in consultation for management of currently uncontrolled symptomatic diabetes requested by  Billie Lade, MD.   Past Medical History:  Diagnosis Date   Anxiety    CAD (coronary artery disease)    Multivessel status post CABG June 2021 - LIMA to LAD, SVG to distal RCA, SVG to OM1   Chronic obstructive pulmonary disease, unspecified (HCC)    Depression    Essential hypertension    GERD    History of pneumonia    Incarcerated ventral hernia    Osteoarthritis    Type 2 diabetes mellitus (HCC)     Past Surgical History:  Procedure Laterality Date   CATARACT EXTRACTION Bilateral 2023   CORONARY ARTERY BYPASS GRAFT N/A 05/22/2020   Procedure: CORONARY ARTERY BYPASS GRAFTING (CABG) x3 LIMA TO LAD, SVG TO OM1, SVG TO DISTAL RIGHT;  Surgeon: Corliss Skains, MD;  Location: [facility] OR;  Service: Open Heart Surgery;  Laterality: N/A;   LEFT HEART CATH AND CORONARY ANGIOGRAPHY N/A 05/21/2020   Procedure: LEFT HEART CATH AND CORONARY ANGIOGRAPHY;  Surgeon: Swazil[place], Peter M, MD;  Location: [facility] INVASIVE CV LAB;  Service: Cardiovascular;  Laterality: N/A;   NEPHRECTOMY     TEE WITHOUT CARDIOVERSION N/A 05/22/2020   Procedure: TRANSESOPHAGEAL ECHOCARDIOGRAM (TEE);  Surgeon: Corliss Skains, MD;  Location: [facility] OR;  Service: Open Heart Surgery;  Laterality: N/A;   VEIN HARVEST Right 05/22/2020   Procedure: Open Vein Harvest;  Surgeon: Corliss Skains, MD;  Location: [facility] OR;  Service: Open Heart Surgery;  Laterality: Right;   VENTRAL HERNIA REPAIR  09/01/2017   mistaken entry    Social History   Socioeconomic History   Marital status: Widowed    Spouse name: Not on file   Number of children: 1   Years of education: 69    Highest education level: 12th grade  Occupational History   Occupation: Retired  Tobacco Use   Smoking status: Never    Passive exposure: Never   Smokeless tobacco: Never  Vaping Use   Vaping status: Never Used  Substance and Sexual Activity   Alcohol use: No   Drug use: No   Sexual activity: Not Currently  Other Topics Concern   Not on file  Social History Narrative   Lives alone.  Has 1 daughter      Enjoys reading and cooking      Eats all food groups   Caffeine, some coffee   Water reports drinking pretty well throughout the day      Social Determinants of Health   Financial Resource Strain: Low Risk  (01/19/2023)   Overall Financial Resource Strain (CARDIA)    Difficulty of Paying Living Expenses: Not hard at all  Food Insecurity: No Food Insecurity (04/23/2023)   Hunger Vital Sign    Worried About Running Out of Food in the Last Year: Never true    Ran Out of Food in the Last Year: Never true  Transportation Needs: No Transportation Needs (04/23/2023)   PRAPARE - Administrator, Civil Service (Medical): No    Lack of Transportation (Non-Medical): No  Physical Activity: Insufficiently Active (01/19/2023)   Exercise Vital Sign    Days of Exercise per Week: 5 days    Minutes of Exercise per Session: 20 min  Stress: No Stress Concern Present (01/19/2023)   [employer] of Occupational Health - Occupational Stress Questionnaire    Feeling of Stress : Not at all  Social Connections: Moderately Isolated (01/19/2023)   Social Connection and Isolation Panel [NHANES]    Frequency of Communication with Friends and Family: More than three times a week    Frequency of Social Gatherings with Friends and Family: More than three times a week    Attends Religious Services: More than 4 times per year    Active Member of [employer] or Organizations: No    Attends Banker Meetings: Never    Marital Status: Widowed    Family History  Problem Relation Age of Onset    CAD Father    Heart failure Sister    CAD Daughter    Heart disease Daughter     Outpatient Encounter Medications as of 10/12/2023  Medication Sig   Accu-Chek Softclix Lancets lancets Use as instructed   acetaminophen (TYLENOL) 325 MG tablet Take 650 mg by mouth every 6 (six) hours as needed for moderate pain.   aspirin EC 81 MG tablet Take 1 tablet (81 mg total) by mouth daily. Swallow whole.   atorvastatin (LIPITOR) 40 MG tablet Take 1 tablet (40 mg total) by mouth at bedtime.   blood glucose meter kit and supplies Dispense based on patient and insurance preference. Use up to four times daily as directed. (FOR ICD-10 E10.9, E11.9).   Blood Pressure Monitor DEVI 1 each by Does not apply route daily.   cetirizine (ZYRTEC) 10 MG tablet Take 1 tablet (10 mg total) by mouth daily as needed (allergies).   famotidine (PEPCID) 20 MG tablet take 1 tablet (20 milligram total) by mouth daily.   fish oil-omega-3 fatty acids 1000 MG capsule Take 1 g by mouth daily.   glucose blood (ACCU-CHEK GUIDE) test strip Use as instructed to monitor glucose twice daily   glucose blood test strip Use as instructed to monitor glucose twice daily   guaiFENesin-dextromethorphan (ROBITUSSIN DM) 100-10 MG/5ML syrup Take 5 mLs by mouth every 4 (four) hours as needed for cough.   Multiple Vitamin (MULITIVITAMIN WITH MINERALS) TABS Take 1 tablet by mouth daily.   Multiple Vitamins-Minerals (AIRBORNE GUMMIES PO) Take by mouth daily.   nitroGLYCERIN (NITROSTAT) 0.4 MG SL tablet Place 1 tablet (0.4 mg total) under the tongue every 5 (five) minutes x 3 doses as needed for chest pain (if no relief after 3rd dose, proceed to the ED or call 911).   polyethylene glycol (MIRALAX / GLYCOLAX) 17 g packet Take 17 g by mouth daily as needed for mild constipation or moderate constipation.   [DISCONTINUED] linagliptin (TRADJENTA) 5 MG TABS tablet Take 1 tablet by mouth once daily   [DISCONTINUED] metFORMIN (GLUCOPHAGE) 500 MG tablet  Take 1 tablet (500 mg total) by mouth daily with breakfast.   linagliptin (TRADJENTA) 5 MG TABS tablet Take 1 tablet (5 mg total) by mouth daily.   metFORMIN (GLUCOPHAGE) 500 MG tablet Take 1 tablet (500 mg total) by mouth daily with breakfast.   No facility-administered encounter medications on file as of 10/12/2023.    ALLERGIES:  Allergies  Allergen Reactions   Iodinated Contrast Media Other (See Comments)    Unknown reaction. Due to kidney issues. Do not use Unknown reaction. Due to kidney issues. Do not use   Codeine Rash   Morphine And Codeine Other (See Comments) and Rash    Unknown Reaction Unknown Reaction   Other     Pt has multiple allergies per pt but unsure of all.    Penicillins Other (See Comments)    Unknown reaction Unknown reaction    VACCINATION STATUS: Immunization History  Administered Date(s) Administered   Influenza, High Dose Seasonal PF 11/12/2017, 08/13/2018   Influenza-Unspecified 11/12/2011, 11/03/2012, 11/02/2013, 09/20/2014, 09/19/2015, 09/23/2016, 08/18/2019   Pneumococcal Conjugate-13 09/20/2014   Pneumococcal Polysaccharide-23 03/18/2016    Diabetes She presents for her follow-up diabetic visit. She has type 2 diabetes mellitus. Onset time: Was diagnosed at approx age of 56. Her disease course has been stable. There are no hypoglycemic associated symptoms. Associated symptoms include blurred vision and foot paresthesias. Pertinent negatives for diabetes include no fatigue, no polydipsia and no polyuria. There are no hypoglycemic complications. Symptoms are stable. Diabetic complications include heart disease, nephropathy and peripheral neuropathy. Risk factors for coronary artery disease include diabetes mellitus, dyslipidemia, hypertension, stress, tobacco exposure, sedentary lifestyle and post-menopausal. Current diabetic treatment includes oral agent (dual therapy). She is compliant with treatment most of the time. Her weight is fluctuating  minimally. She is following a generally healthy diet. When asked about meal planning, she reported none. She has not had a previous visit with a dietitian. She rarely participates in exercise. Her home blood glucose trend is fluctuating minimally. Her breakfast blood glucose range is generally 140-180 mg/dl. (She presents today with her meter and logs showing mostly at goal fasting glycemic profile.  Her POCT A1c today is 7.2%, increasing from last visit of 6.9%.   She notes she cheats on her diet every once in a while but has been exercising more.  She notes she has been more stressed in the last several months.) An ACE inhibitor/angiotensin II receptor blocker is not being taken. She does not see a podiatrist.Eye exam is current.  Hyperlipidemia This is a chronic problem. The current episode started more than 1 year ago. The problem is uncontrolled. Recent lipid tests were reviewed and are variable. Exacerbating diseases include chronic renal disease and diabetes. Factors aggravating her hyperlipidemia include fatty foods and beta blockers. Current antihyperlipidemic treatment includes statins. The current treatment provides mild improvement of lipids. Compliance problems include adherence to diet and adherence to exercise.  Risk factors for coronary artery disease include diabetes mellitus, dyslipidemia, hypertension, post-menopausal, a sedentary lifestyle and stress.  Hypertension This is a chronic problem. The current episode started more than 1 year ago. The problem has been waxing and waning since onset. The problem is controlled. Associated symptoms include blurred vision. There are no associated agents to hypertension. Risk factors for coronary artery disease include diabetes mellitus, dyslipidemia, sedentary lifestyle, smoking/tobacco exposure, stress and post-menopausal state. Past treatments include beta blockers. The current treatment provides mild improvement. There are no compliance problems.   Hypertensive end-organ damage includes kidney disease and CAD/MI. Identifiable causes of hypertension include chronic renal disease.    Review of systems  Constitutional: + stable body weight,  current Body mass index is 29.49 kg/m. , no fatigue, no subjective hyperthermia, no subjective hypothermia Eyes: no blurry vision, no xerophthalmia ENT: no sore throat, no nodules palpated in throat, no dysphagia/odynophagia, no hoarseness Cardiovascular: no chest pain, no  shortness of breath, no palpitations, no leg swelling Respiratory: no cough, no shortness of breath Gastrointestinal: no nausea/vomiting/diarrhea, large hernia located to left side-tender to touch Musculoskeletal: no muscle/joint aches Skin: no rashes, no hyperemia Neurological: no tremors, no numbness, no tingling, no dizziness Psychiatric: no depression, no anxiety  Objective:     BP 138/68 (BP Location: Right Arm, Patient Position: Sitting, Cuff Size: Large)   Pulse 67   Ht 4\' 11"  (1.499 m)   Wt 146 lb (66.2 kg)   BMI 29.49 kg/m   Wt Readings from Last 3 Encounters:  10/12/23 146 lb (66.2 kg)  10/01/23 147 lb 3.2 oz (66.8 kg)  09/10/23 146 lb 3.2 oz (66.3 kg)     BP Readings from Last 3 Encounters:  10/12/23 138/68  10/01/23 118/62  09/10/23 (!) 164/71      Physical Exam- Limited  Constitutional:  Body mass index is 29.49 kg/m. , not in acute distress, anxious state of mind Eyes:  EOMI, no exophthalmos Musculoskeletal: she does have abnormal bulge in LLQ of abdomen stretching around her back (inoperable hernia), strength intact in all four extremities, no gross restriction of joint movements Skin:  no rashes, no hyperemia Neurological: no tremor with outstretched hands   CMP ( most recent) CMP     Component Value Date/Time   NA 137 09/01/2023 1801   NA 141 11/21/2021 1115   K 4.5 09/01/2023 1801   CL 105 09/01/2023 1801   CO2 25 09/01/2023 1801   GLUCOSE 145 (H) 09/01/2023 1801   BUN 28 (H)  09/01/2023 1801   BUN 27 11/21/2021 1115   CREATININE 1.20 (H) 09/01/2023 1801   CREATININE 1.28 (H) 03/07/2020 1137   CALCIUM 8.6 (L) 09/01/2023 1801   CALCIUM 9.2 06/23/2023 1403   PROT 7.0 09/01/2023 1801   PROT 6.8 11/21/2021 1115   ALBUMIN 3.5 09/01/2023 1801   ALBUMIN 4.5 11/21/2021 1115   AST 20 09/01/2023 1801   ALT 21 09/01/2023 1801   ALKPHOS 46 09/01/2023 1801   BILITOT 0.5 09/01/2023 1801   BILITOT 0.3 11/21/2021 1115   GFRNONAA 45 (L) 09/01/2023 1801   GFRNONAA 40 (L) 03/07/2020 1137   GFRAA 48 (L) 12/28/2020 0955   GFRAA 46 (L) 03/07/2020 1137     Diabetic Labs (most recent): Lab Results  Component Value Date   HGBA1C 7.2 (A) 10/12/2023   HGBA1C 6.9 (A) 06/04/2023   HGBA1C 7.1 (A) 02/03/2023   MICROALBUR 30 02/27/2021   MICROALBUR 2.4 03/07/2020   MICROALBUR 0.9 08/13/2018     Lipid Panel ( most recent) Lipid Panel     Component Value Date/Time   CHOL 100 04/15/2023 1414   CHOL 121 11/21/2021 1115   TRIG 102 04/15/2023 1414   HDL 37 (L) 04/15/2023 1414   HDL 39 (L) 11/21/2021 1115   CHOLHDL 2.7 04/15/2023 1414   VLDL 20 04/15/2023 1414   LDLCALC 43 04/15/2023 1414   LDLCALC 55 11/21/2021 1115   LDLCALC 109 (H) 03/07/2020 1137   LABVLDL 27 11/21/2021 1115      Lab Results  Component Value Date   TSH 2.469 05/21/2022   TSH 3.040 08/22/2021   TSH 2.02 11/17/2019   FREET4 1.25 08/22/2021           Assessment & Plan:   1) Controlled Type 2 Diabetes with CKD stage 3  - [name] [name] has currently uncontrolled symptomatic type 2 DM since 82 years of age.  She presents today with her meter and logs  showing mostly at goal fasting glycemic profile.  Her POCT A1c today is 7.2%, increasing from last visit of 6.9%.   She notes she cheats on her diet every once in a while but has been exercising more.  She notes she has been more stressed in the last several months.  -Recent labs reviewed.  - I had a long discussion with her about the  progressive nature of diabetes and the pathology behind its complications. -her diabetes is complicated by CAD and she remains at a high risk for more acute and chronic complications which include CAD, CVA, CKD, retinopathy, and neuropathy. These are all discussed in detail with her.  - Nutritional counseling repeated at each appointment due to patients tendency to fall back in to old habits.  - The patient admits there is a room for improvement in their diet and drink choices. -  Suggestion is made for the patient to avoid simple carbohydrates from their diet including Cakes, Sweet Desserts / Pastries, Ice Cream, Soda (diet and regular), Sweet Tea, Candies, Chips, Cookies, Sweet Pastries, Store Bought Juices, Alcohol in Excess of 1-2 drinks a day, Artificial Sweeteners, Coffee Creamer, and "Sugar-free" Products. This will help patient to have stable blood glucose profile and potentially avoid unintended weight gain.   - I encouraged the patient to switch to unprocessed or minimally processed complex starch and increased protein intake (animal or plant source), fruits, and vegetables.   - Patient is advised to stick to a routine mealtimes to eat 3 meals a day and avoid unnecessary snacks (to snack only to correct hypoglycemia).  - she will be scheduled with Norm Salt, RDN, CDE for diabetes education.  - I have approached her with the following individualized plan to manage  her diabetes and patient agrees:   Given her stable, at goal glycemic profile, she can continue her current medication regimen of Metformin 500 mg po once daily with breakfast, Tradjenta 5 mg po daily with breakfast.    -she is encouraged to continue monitoring blood glucose at least once daily, before breakfast, and to call the clinic if she has readings less than 70 or above 200 for 3 tests in a row.  - Specific targets for  A1c;  LDL, HDL,  and Triglycerides were discussed with the patient.  2) Blood Pressure  /Hypertension:  her blood pressure is controlled to target for her age.   she is not currently on any antihypertensive medications at this time.  3) Lipids/Hyperlipidemia:    Review of her recent lipid panel from 04/15/23 showed controlled  LDL at 43.  she  is advised to continue Lipitor 40 mg daily at bedtime and Fish Oil supplement.  Side effects and precautions discussed with her.    4)  Weight/Diet:  her Body mass index is 29.49 kg/m.  -  clearly complicating her diabetes care.   she is a candidate for weight loss. I discussed with her the fact that loss of 5 - 10% of her  current body weight will have the most impact on her diabetes management.  Exercise, and detailed carbohydrates information provided  -  detailed on discharge instructions.  5) Hypervitaminosis of vitamin D Her most recent vitamin D level on 08/22/21 was high at 113.  She was taking several supplements that contained vitamin D in it.  The nephrologist asked her to stop all vitamin D supplements.  6) Chronic Care/Health Maintenance: -she is on Statin medications and is encouraged to initiate and continue to follow  up with Ophthalmology, Dentist, Podiatrist at least yearly or according to recommendations, and advised to stay away from smoking. I have recommended yearly flu vaccine and pneumonia vaccine at least every 5 years; moderate intensity exercise for up to 150 minutes weekly; and sleep for at least 7 hours a day.  - she is advised to maintain close follow up with Durwin Nora Lucina Mellow, MD for primary care needs, as well as her other providers for optimal and coordinated care.  She did ask me today to write her a letter of exemption to wear safety belt in the car.  I did do this for her today as she has significant hernia stretching from LLQ of abdomen around her back which is tender to touch and would cause significant discomfort for her.      I spent  46  minutes in the care of the patient today including review of labs  from CMP, Lipids, Thyroid Function, Hematology (current and previous including abstractions from other facilities); face-to-face time discussing  her blood glucose readings/logs, discussing hypoglycemia and hyperglycemia episodes and symptoms, medications doses, her options of short and long term treatment based on the latest standards of care / guidelines;  discussion about incorporating lifestyle medicine;  and documenting the encounter. Risk reduction counseling performed per USPSTF guidelines to reduce obesity and cardiovascular risk factors.     Please refer to Patient Instructions for Blood Glucose Monitoring and Insulin/Medications Dosing Guide"  in media tab for additional information. Please  also refer to " Patient Self Inventory" in the Media  tab for reviewed elements of pertinent patient history.  [name] [name] participated in the discussions, expressed understanding, and voiced agreement with the above plans.  All questions were answered to her satisfaction. she is encouraged to contact clinic should she have any questions or concerns prior to her return visit.   Follow up plan: - Return in about 4 months (around 02/12/2024) for Diabetes F/U with A1c in office, No previsit labs, Bring meter and logs.  Ronny Bacon, [facility] [address] [place], [place] [address] Phone: [phone] Fax: [phone]  10/12/2023, 12:44 PM

## 2023-10-16 ENCOUNTER — Ambulatory Visit: Payer: Medicare Other | Attending: Cardiology | Admitting: *Deleted

## 2023-10-16 NOTE — Progress Notes (Unsigned)
Patient in office for nurse BP check.  BP 114/58  63  96% - nurse reading  Recheck by her home monitor - 127/73  75    Patient also returned BP log - placed on provider desk for her review.

## 2023-10-19 ENCOUNTER — Telehealth: Payer: Self-pay | Admitting: Pulmonary Disease

## 2023-10-19 DIAGNOSIS — G4733 Obstructive sleep apnea (adult) (pediatric): Secondary | ICD-10-CM

## 2023-10-19 NOTE — Telephone Encounter (Signed)
HST showed mild  OSA with AHI 11/ hr Did she perform test when sleeping in a recliner? Suggest CPAP titration study to see if she will tolerate mask/CPAP therapy

## 2023-10-20 DIAGNOSIS — E1151 Type 2 diabetes mellitus with diabetic peripheral angiopathy without gangrene: Secondary | ICD-10-CM | POA: Diagnosis not present

## 2023-10-20 DIAGNOSIS — E1140 Type 2 diabetes mellitus with diabetic neuropathy, unspecified: Secondary | ICD-10-CM | POA: Diagnosis not present

## 2023-10-20 NOTE — Progress Notes (Signed)
Patient notified and verbalized understanding.  States that she did recently get this monitor from [facility].  Stated pharmacist told her there was no need for her to write down her BP readings because her monitor had memory.   We asked her to keep the paper log - states this was confusing to her.    Instructions explained to only check BP 2 hours after morning medications & to please start fresh log moving forward for another 10-14 days.  Instructed to bring log & drop off up front for provider review.  She verbalized understanding.

## 2023-10-23 NOTE — Telephone Encounter (Signed)
Patient aware of results and agrees to titration study. She is aware someone will contact her to schedule.

## 2023-11-03 ENCOUNTER — Other Ambulatory Visit: Payer: Self-pay | Admitting: Internal Medicine

## 2023-11-05 ENCOUNTER — Telehealth: Payer: Self-pay | Admitting: Cardiology

## 2023-11-05 NOTE — Telephone Encounter (Signed)
Patient stated she wants a call back to discuss sleep study and paperwork required.

## 2023-11-06 ENCOUNTER — Telehealth: Payer: Self-pay | Admitting: Pulmonary Disease

## 2023-11-09 NOTE — Telephone Encounter (Signed)
Spoke with patient regarding her appointment in [place].  She says she does not have transportation.  Advised patient to contact her insurance for support and if she cannot get to her appointment to cancel it.

## 2023-11-10 NOTE — Telephone Encounter (Signed)
Called pt and she states that she is unable to go to the sleep study appt 11/30/23  She wants to discuss rescheduling this for when she can get help with transportation  [place], can you help with this?

## 2023-11-17 NOTE — Telephone Encounter (Signed)
Pt has been scheduled for 12/16.  Pt has been made aware.  Pt will need to arrange transportation herself or with her insurance.  Ephriam Knuckles has made pt aware on 11/16/23.

## 2023-11-20 ENCOUNTER — Telehealth: Payer: Self-pay

## 2023-11-20 NOTE — Telephone Encounter (Signed)
Copied from CRM [id number]. Topic: General - Transportation >> Nov 20, 2023 11:38 AM Maxwell Marion wrote: Reason for CRM: pt is requesting transportation to her apt on Dec 16th for her sleep test/CPAP in [place], has medicaid and [employer]

## 2023-11-20 NOTE — Telephone Encounter (Signed)
Copied from CRM [id number]. Topic: General - Other >> Nov 19, 2023  2:45 PM Donita Brooks wrote: Reason for CRM: pt doesn't have a way to get too the place they are sending her too, pt is requesting to speak with nurse.

## 2023-11-20 NOTE — Telephone Encounter (Signed)
Patient needs to contact Pulmonary office

## 2023-11-20 NOTE — Telephone Encounter (Signed)
Spoke to patient

## 2023-11-23 ENCOUNTER — Telehealth: Payer: Self-pay | Admitting: Pulmonary Disease

## 2023-11-23 NOTE — Telephone Encounter (Signed)
Patient state she does not have transportation for her CPAP titration study on 12/01/23--also she was told that this would be paid for in full-----she states that if we can find her transportation and make sure the study is paid for 100% she will have it done---if not she wants to cancel----patient call back [phone]

## 2023-11-30 ENCOUNTER — Encounter (HOSPITAL_BASED_OUTPATIENT_CLINIC_OR_DEPARTMENT_OTHER): Payer: Medicare Other | Admitting: Pulmonary Disease

## 2023-12-01 ENCOUNTER — Ambulatory Visit: Payer: Medicare Other | Admitting: Internal Medicine

## 2023-12-01 ENCOUNTER — Encounter: Payer: Self-pay | Admitting: Internal Medicine

## 2023-12-01 ENCOUNTER — Other Ambulatory Visit: Payer: Self-pay | Admitting: Internal Medicine

## 2023-12-01 VITALS — BP 137/73 | HR 83 | Ht 59.0 in | Wt 147.0 lb

## 2023-12-01 DIAGNOSIS — E1121 Type 2 diabetes mellitus with diabetic nephropathy: Secondary | ICD-10-CM | POA: Diagnosis not present

## 2023-12-01 DIAGNOSIS — Z7984 Long term (current) use of oral hypoglycemic drugs: Secondary | ICD-10-CM | POA: Diagnosis not present

## 2023-12-01 DIAGNOSIS — I10 Essential (primary) hypertension: Secondary | ICD-10-CM | POA: Diagnosis not present

## 2023-12-01 DIAGNOSIS — Z13820 Encounter for screening for osteoporosis: Secondary | ICD-10-CM

## 2023-12-01 DIAGNOSIS — E782 Mixed hyperlipidemia: Secondary | ICD-10-CM

## 2023-12-01 DIAGNOSIS — N1832 Chronic kidney disease, stage 3b: Secondary | ICD-10-CM | POA: Diagnosis not present

## 2023-12-01 NOTE — Progress Notes (Signed)
Established Patient Office Visit  Subjective   Patient ID: [name] [name], female    DOB: [birth year]  Age: 82 y.o. MRN: [id number]  Chief Complaint  Patient presents with   Follow-up   [name] [name] returns to care today for routine follow-up.  She was last evaluated by me on 9/26.  I recommended that she resume lisinopril 5 mg daily as previously prescribed in the setting of poorly controlled HTN and CKD.  No additional medication changes were made and 6-month follow-up was arranged.  In the interim, she has been seen by cardiology and endocrinology for follow-up.  There have otherwise been no acute interval events. [name] [name] reports feeling well today.  She is asymptomatic and has no acute concerns to discuss.  Past Medical History:  Diagnosis Date   Anxiety    CAD (coronary artery disease)    Multivessel status post CABG June 2021 - LIMA to LAD, SVG to distal RCA, SVG to OM1   Chronic obstructive pulmonary disease, unspecified (HCC)    Depression    Essential hypertension    GERD    History of pneumonia    Incarcerated ventral hernia    Osteoarthritis    Type 2 diabetes mellitus (HCC)    Past Surgical History:  Procedure Laterality Date   CATARACT EXTRACTION Bilateral 2023   CORONARY ARTERY BYPASS GRAFT N/A 05/22/2020   Procedure: CORONARY ARTERY BYPASS GRAFTING (CABG) x3 LIMA TO LAD, SVG TO OM1, SVG TO DISTAL RIGHT;  Surgeon: Corliss Skains, MD;  Location: [facility] OR;  Service: Open Heart Surgery;  Laterality: N/A;   LEFT HEART CATH AND CORONARY ANGIOGRAPHY N/A 05/21/2020   Procedure: LEFT HEART CATH AND CORONARY ANGIOGRAPHY;  Surgeon: [place], Peter M, MD;  Location: [facility] INVASIVE CV LAB;  Service: Cardiovascular;  Laterality: N/A;   NEPHRECTOMY     TEE WITHOUT CARDIOVERSION N/A 05/22/2020   Procedure: TRANSESOPHAGEAL ECHOCARDIOGRAM (TEE);  Surgeon: Corliss Skains, MD;  Location: [facility] OR;  Service: Open Heart Surgery;  Laterality: N/A;   VEIN HARVEST Right 05/22/2020    Procedure: Open Vein Harvest;  Surgeon: Corliss Skains, MD;  Location: [facility] OR;  Service: Open Heart Surgery;  Laterality: Right;   VENTRAL HERNIA REPAIR  09/01/2017   mistaken entry   Social History   Tobacco Use   Smoking status: Never    Passive exposure: Never   Smokeless tobacco: Never  Vaping Use   Vaping status: Never Used  Substance Use Topics   Alcohol use: No   Drug use: No   Family History  Problem Relation Age of Onset   CAD Father    Heart failure Sister    CAD Daughter    Heart disease Daughter    Allergies  Allergen Reactions   Iodinated Contrast Media Other (See Comments)    Unknown reaction. Due to kidney issues. Do not use Unknown reaction. Due to kidney issues. Do not use   Codeine Rash   Morphine And Codeine Other (See Comments) and Rash    Unknown Reaction Unknown Reaction   Other     Pt has multiple allergies per pt but unsure of all.    Penicillins Other (See Comments)    Unknown reaction Unknown reaction   Review of Systems  Constitutional:  Negative for chills and fever.  HENT:  Negative for sore throat.   Respiratory:  Negative for cough and shortness of breath.   Cardiovascular:  Negative for chest pain, palpitations and leg swelling.  Gastrointestinal:  Negative for abdominal pain, blood in stool, constipation, diarrhea, nausea and vomiting.  Genitourinary:  Negative for dysuria and hematuria.  Musculoskeletal:  Negative for myalgias.  Skin:  Negative for itching and rash.  Neurological:  Negative for dizziness and headaches.  Psychiatric/Behavioral:  Negative for depression and suicidal ideas.      Objective:     BP 137/73   Pulse 83   Ht 4\' 11"  (1.499 m)   Wt 147 lb (66.7 kg)   SpO2 97%   BMI 29.69 kg/m  BP Readings from Last 3 Encounters:  12/01/23 137/73  10/16/23 (!) 114/58  10/12/23 138/68   Physical Exam Vitals reviewed.  Constitutional:      General: She is not in acute distress.    Appearance: Normal  appearance. She is obese. She is not toxic-appearing.  HENT:     Head: Normocephalic and atraumatic.     Right Ear: External ear normal.     Left Ear: External ear normal.     Nose: Nose normal. No congestion or rhinorrhea.     Mouth/Throat:     Mouth: Mucous membranes are moist.     Pharynx: Oropharynx is clear. No oropharyngeal exudate or posterior oropharyngeal erythema.  Eyes:     General: No scleral icterus.    Extraocular Movements: Extraocular movements intact.     Conjunctiva/sclera: Conjunctivae normal.     Pupils: Pupils are equal, round, and reactive to light.  Cardiovascular:     Rate and Rhythm: Normal rate and regular rhythm.     Pulses: Normal pulses.     Heart sounds: Normal heart sounds. No murmur heard.    No friction rub. No gallop.  Pulmonary:     Effort: Pulmonary effort is normal.     Breath sounds: Normal breath sounds. No wheezing, rhonchi or rales.  Abdominal:     General: Abdomen is flat. Bowel sounds are normal. There is no distension.     Palpations: Abdomen is soft.     Tenderness: There is no abdominal tenderness.  Musculoskeletal:        General: No swelling. Normal range of motion.     Cervical back: Normal range of motion.     Right lower leg: No edema.     Left lower leg: No edema.  Lymphadenopathy:     Cervical: No cervical adenopathy.  Skin:    General: Skin is warm and dry.     Capillary Refill: Capillary refill takes less than 2 seconds.     Coloration: Skin is not jaundiced.  Neurological:     General: No focal deficit present.     Mental Status: She is alert and oriented to person, place, and time.  Psychiatric:        Mood and Affect: Mood normal.        Behavior: Behavior normal.   Last CBC Lab Results  Component Value Date   WBC 8.0 09/01/2023   HGB 11.6 (L) 09/01/2023   HCT 33.8 (L) 09/01/2023   MCV 93.6 09/01/2023   MCH 32.1 09/01/2023   RDW 12.2 09/01/2023   PLT 190 09/01/2023   Last metabolic panel Lab Results   Component Value Date   GLUCOSE 145 (H) 09/01/2023   NA 137 09/01/2023   K 4.5 09/01/2023   CL 105 09/01/2023   CO2 25 09/01/2023   BUN 28 (H) 09/01/2023   CREATININE 1.20 (H) 09/01/2023   GFRNONAA 45 (L) 09/01/2023   CALCIUM 8.6 (L) 09/01/2023   PHOS 3.7  06/23/2023   PROT 7.0 09/01/2023   ALBUMIN 3.5 09/01/2023   LABGLOB 2.3 11/21/2021   AGRATIO 2.0 11/21/2021   BILITOT 0.5 09/01/2023   ALKPHOS 46 09/01/2023   AST 20 09/01/2023   ALT 21 09/01/2023   ANIONGAP 7 09/01/2023   Last lipids Lab Results  Component Value Date   CHOL 100 04/15/2023   HDL 37 (L) 04/15/2023   LDLCALC 43 04/15/2023   TRIG 102 04/15/2023   CHOLHDL 2.7 04/15/2023   Last hemoglobin A1c Lab Results  Component Value Date   HGBA1C 7.2 (A) 10/12/2023   Last thyroid functions Lab Results  Component Value Date   TSH 2.469 05/21/2022   Last vitamin D Lab Results  Component Value Date   VD25OH 51.37 06/23/2023     Assessment & Plan:   Problem List Items Addressed This Visit       Essential hypertension   Currently off antihypertensive therapy.  She has previously been prescribed lisinopril but is not taking it due to concern that it causes bradycardia.  She also states that a family member experienced complications due to lisinopril. -We reviewed indications for low-dose losartan (CKD, DM, HTN).  She will consider this and let us know if she is in agreement with starting treatment.      Type 2 diabetes mellitus with diabetic nephropathy (HCC)   Followed by endocrinology.  A1c 7.2 on labs from October.  She is prescribed metformin and Tradjenta. -Diabetic eye exam scheduled for next month      Chronic kidney disease, stage 3b (HCC)   Followed by nephrology (Dr. Wolfgang Phoenix).  Renal function stable on latest available labs.  As otherwise documented, we reviewed indications for low-dose losartan.  She will consider this and let us know if she is interested in starting treatment.      Mixed  hyperlipidemia   Lipid panel updated in May.  Total cholesterol 100 and LDL 43.  She remains on atorvastatin 40 mg daily.      Return in about 3 months (around 02/29/2024).   Billie Lade, MD

## 2023-12-01 NOTE — Assessment & Plan Note (Signed)
Currently off antihypertensive therapy.  She has previously been prescribed lisinopril but is not taking it due to concern that it causes bradycardia.  She also states that a family member experienced complications due to lisinopril. -We reviewed indications for low-dose losartan (CKD, DM, HTN).  She will consider this and let us know if she is in agreement with starting treatment.

## 2023-12-01 NOTE — Patient Instructions (Signed)
It was a pleasure to see you today.  Thank you for giving [place] the opportunity to be involved in your care.  Below is a brief recap of your visit and next steps.  We will plan to see you again in 3 months.  Summary No medication changes today Consider losartan for kidney protection in the setting of diabetes and chronic kidney disease Follow up in 3 months

## 2023-12-01 NOTE — Assessment & Plan Note (Signed)
Followed by endocrinology.  A1c 7.2 on labs from October.  She is prescribed metformin and Tradjenta. -Diabetic eye exam scheduled for next month

## 2023-12-01 NOTE — Telephone Encounter (Signed)
Patient wants to make sure that Dr. Vassie Loll knows the reason she cancelled her sleep study was she did not have transportation.

## 2023-12-01 NOTE — Assessment & Plan Note (Signed)
Lipid panel updated in May.  Total cholesterol 100 and LDL 43.  She remains on atorvastatin 40 mg daily.

## 2023-12-01 NOTE — Assessment & Plan Note (Signed)
Followed by nephrology (Dr. Wolfgang Phoenix).  Renal function stable on latest available labs.  As otherwise documented, we reviewed indications for low-dose losartan.  She will consider this and let us know if she is interested in starting treatment.

## 2023-12-02 ENCOUNTER — Ambulatory Visit: Payer: Medicare Other | Admitting: Cardiology

## 2023-12-03 NOTE — Telephone Encounter (Signed)
Patient has decided to hold off until after the New Year and will call us back when she wants to set anything up.

## 2023-12-03 NOTE — Telephone Encounter (Signed)
Sleep lab in [place] has been closed, per Terri Pennix from the sleep center

## 2023-12-17 ENCOUNTER — Other Ambulatory Visit: Payer: Self-pay | Admitting: Internal Medicine

## 2024-01-12 DIAGNOSIS — E1140 Type 2 diabetes mellitus with diabetic neuropathy, unspecified: Secondary | ICD-10-CM | POA: Diagnosis not present

## 2024-01-12 DIAGNOSIS — E1151 Type 2 diabetes mellitus with diabetic peripheral angiopathy without gangrene: Secondary | ICD-10-CM | POA: Diagnosis not present

## 2024-01-13 ENCOUNTER — Ambulatory Visit: Payer: Medicare Other | Admitting: Cardiology

## 2024-01-26 ENCOUNTER — Ambulatory Visit (INDEPENDENT_AMBULATORY_CARE_PROVIDER_SITE_OTHER): Payer: Medicare Other

## 2024-01-26 VITALS — Ht 59.0 in | Wt 145.0 lb

## 2024-01-26 DIAGNOSIS — Z780 Asymptomatic menopausal state: Secondary | ICD-10-CM

## 2024-01-26 DIAGNOSIS — Z0000 Encounter for general adult medical examination without abnormal findings: Secondary | ICD-10-CM

## 2024-01-26 DIAGNOSIS — Z1231 Encounter for screening mammogram for malignant neoplasm of breast: Secondary | ICD-10-CM

## 2024-01-26 NOTE — Progress Notes (Signed)
Because this visit was a virtual/telehealth visit,  certain criteria was not obtained, such a blood pressure, CBG if applicable, and timed get up and go. Any medications not marked as "taking" were not mentioned during the medication reconciliation part of the visit. Any vitals not documented were not able to be obtained due to this being a telehealth visit or patient was unable to self-report a recent blood pressure reading due to a lack of equipment at home via telehealth. Vitals that have been documented are verbally provided by the patient.  Interactive audio and video telecommunications were attempted between this provider and patient, however failed, due to patient having technical difficulties OR patient did not have access to video capability.  We continued and completed visit with audio only.  Subjective:   [name] [name] is a 83 y.o. female who presents for Medicare Annual (Subsequent) preventive examination.  Visit Complete: Virtual I connected with  [name] [name] on 01/26/24 by a audio enabled telemedicine application and verified that I am speaking with the correct person using two identifiers.  Patient Location: Home  Provider Location: Home Office  I discussed the limitations of evaluation and management by telemedicine. The patient expressed understanding and agreed to proceed.  Vital Signs: Because this visit was a virtual/telehealth visit, some criteria may be missing or patient reported. Any vitals not documented were not able to be obtained and vitals that have been documented are patient reported.  Cardiac Risk Factors include: advanced age (>68men, >52 women);diabetes mellitus;dyslipidemia;hypertension;sedentary lifestyle     Objective:    Today's Vitals   01/26/24 0846  Weight: 145 lb (65.8 kg)  Height: 4\' 11"  (1.499 m)   Body mass index is 29.29 kg/m.     01/26/2024    8:46 AM 09/01/2023    5:01 PM 07/25/2023    6:20 PM 07/21/2023    6:14 PM 04/23/2023    6:00  PM 04/23/2023    2:14 PM 01/19/2023   10:23 AM  Advanced Directives  Does Patient Have a Medical Advance Directive? No No No No No No No  Would patient like information on creating a medical advance directive? No - Patient declined  No - Patient declined  No - Patient declined  No - Patient declined    Current Medications (verified) Outpatient Encounter Medications as of 01/26/2024  Medication Sig   Accu-Chek Softclix Lancets lancets Use as instructed   acetaminophen (TYLENOL) 325 MG tablet Take 650 mg by mouth every 6 (six) hours as needed for moderate pain.   aspirin EC 81 MG tablet Take 1 tablet (81 mg total) by mouth daily. Swallow whole.   atorvastatin (LIPITOR) 40 MG tablet Take 1 tablet (40 mg total) by mouth at bedtime.   blood glucose meter kit and supplies Dispense based on patient and insurance preference. Use up to four times daily as directed. (FOR ICD-10 E10.9, E11.9).   Blood Pressure Monitor DEVI 1 each by Does not apply route daily.   cetirizine (ZYRTEC) 10 MG tablet Take 1 tablet (10 mg total) by mouth daily as needed (allergies).   famotidine (PEPCID) 20 MG tablet take 1 tablet (20 milligram total) by mouth daily.   fish oil-omega-3 fatty acids 1000 MG capsule Take 1 g by mouth daily.   glucose blood (ACCU-CHEK GUIDE) test strip Use as instructed to monitor glucose twice daily   glucose blood test strip Use as instructed to monitor glucose twice daily   guaiFENesin-dextromethorphan (ROBITUSSIN DM) 100-10 MG/5ML syrup Take 5 mLs  by mouth every 4 (four) hours as needed for cough.   linagliptin (TRADJENTA) 5 MG TABS tablet Take 1 tablet (5 mg total) by mouth daily.   metFORMIN (GLUCOPHAGE) 500 MG tablet Take 1 tablet (500 mg total) by mouth daily with breakfast.   Multiple Vitamin (MULITIVITAMIN WITH MINERALS) TABS Take 1 tablet by mouth daily.   Multiple Vitamins-Minerals (AIRBORNE GUMMIES PO) Take by mouth daily.   nitroGLYCERIN (NITROSTAT) 0.4 MG SL tablet Place 1 tablet (0.4  mg total) under the tongue every 5 (five) minutes x 3 doses as needed for chest pain (if no relief after 3rd dose, proceed to the ED or call 911).   polyethylene glycol (MIRALAX / GLYCOLAX) 17 g packet Take 17 g by mouth daily as needed for mild constipation or moderate constipation.   No facility-administered encounter medications on file as of 01/26/2024.    Allergies (verified) Iodinated contrast media, Codeine, Morphine and codeine, Other, and Penicillins   History: Past Medical History:  Diagnosis Date   Anxiety    CAD (coronary artery disease)    Multivessel status post CABG June 2021 - LIMA to LAD, SVG to distal RCA, SVG to OM1   Chronic obstructive pulmonary disease, unspecified (HCC)    Depression    Essential hypertension    GERD    History of pneumonia    Incarcerated ventral hernia    Osteoarthritis    Type 2 diabetes mellitus (HCC)    Past Surgical History:  Procedure Laterality Date   CATARACT EXTRACTION Bilateral 2023   CORONARY ARTERY BYPASS GRAFT N/A 05/22/2020   Procedure: CORONARY ARTERY BYPASS GRAFTING (CABG) x3 LIMA TO LAD, SVG TO OM1, SVG TO DISTAL RIGHT;  Surgeon: Corliss Skains, MD;  Location: [facility] OR;  Service: Open Heart Surgery;  Laterality: N/A;   LEFT HEART CATH AND CORONARY ANGIOGRAPHY N/A 05/21/2020   Procedure: LEFT HEART CATH AND CORONARY ANGIOGRAPHY;  Surgeon: Swaziland, Peter M, MD;  Location: [facility] INVASIVE CV LAB;  Service: Cardiovascular;  Laterality: N/A;   NEPHRECTOMY     TEE WITHOUT CARDIOVERSION N/A 05/22/2020   Procedure: TRANSESOPHAGEAL ECHOCARDIOGRAM (TEE);  Surgeon: Corliss Skains, MD;  Location: [facility] OR;  Service: Open Heart Surgery;  Laterality: N/A;   VEIN HARVEST Right 05/22/2020   Procedure: Open Vein Harvest;  Surgeon: Corliss Skains, MD;  Location: [facility];  Service: Open Heart Surgery;  Laterality: Right;   VENTRAL HERNIA REPAIR  09/01/2017   mistaken entry   Family History  Problem Relation Age of Onset   CAD Father     Heart failure Sister    CAD Daughter    Heart disease Daughter    Social History   Socioeconomic History   Marital status: Widowed    Spouse name: Not on file   Number of children: 1   Years of education: 66   Highest education level: 12th grade  Occupational History   Occupation: Retired  Tobacco Use   Smoking status: Never    Passive exposure: Never   Smokeless tobacco: Never  Vaping Use   Vaping status: Never Used  Substance and Sexual Activity   Alcohol use: No   Drug use: No   Sexual activity: Not Currently  Other Topics Concern   Not on file  Social History Narrative   Lives alone.  Has 1 daughter      Enjoys reading and cooking      Eats all food groups   Caffeine, some coffee   Water reports drinking  pretty well throughout the day      Social Drivers of Health   Financial Resource Strain: Low Risk  (01/26/2024)   Overall Financial Resource Strain (CARDIA)    Difficulty of Paying Living Expenses: Not hard at all  Food Insecurity: No Food Insecurity (01/26/2024)   Hunger Vital Sign    Worried About Running Out of Food in the Last Year: Never true    Ran Out of Food in the Last Year: Never true  Transportation Needs: No Transportation Needs (01/26/2024)   PRAPARE - Administrator, Civil Service (Medical): No    Lack of Transportation (Non-Medical): No  Physical Activity: Insufficiently Active (01/26/2024)   Exercise Vital Sign    Days of Exercise per Week: 3 days    Minutes of Exercise per Session: 20 min  Stress: No Stress Concern Present (01/26/2024)   [name] [name]    Feeling of Stress : Not at all  Social Connections: Moderately Isolated (01/26/2024)   Social Connection and Isolation Panel [NHANES]    Frequency of Communication with Friends and Family: More than three times a week    Frequency of Social Gatherings with Friends and Family: More than three times a week    Attends  Religious Services: More than 4 times per year    Active Member of [employer] or Organizations: No    Attends Banker Meetings: Never    Marital Status: Widowed    Tobacco Counseling Counseling given: Yes   Clinical Intake:  Pre-visit preparation completed: Yes  Pain : No/denies pain     BMI - recorded: 29.29 Nutritional Status: BMI 25 -29 Overweight Nutritional Risks: None Diabetes: Yes CBG done?: No (telehealth visit. unable to obtain cbg) Did pt. bring in CBG monitor from home?: No  How often do you need to have someone help you when you read instructions, pamphlets, or other written materials from your doctor or pharmacy?: 1 - Never  Interpreter Needed?: No  Information entered by :: Maryjean Ka CMA   Activities of Daily Living    01/26/2024    8:58 AM 04/23/2023    6:00 PM  In your present state of health, do you have any difficulty performing the following activities:  Hearing? 0 0  Vision? 0 0  Difficulty concentrating or making decisions? 0 0  Walking or climbing stairs? 0 0  Dressing or bathing? 0 0  Doing errands, shopping? 0 0  Preparing Food and eating ? N   Using the Toilet? N   In the past six months, have you accidently leaked urine? N   Do you have problems with loss of bowel control? N   Managing your Medications? N   Managing your Finances? N   Housekeeping or managing your Housekeeping? N     Patient Care Team: Billie Lade, MD as PCP - General (Internal Medicine) Jonelle Sidle, MD as PCP - Cardiology (Cardiology) Sharlene Dory, NP as Nurse Practitioner (Cardiology)  Indicate any recent Medical Services you may have received from other than [facility] providers in the past year (date may be approximate).     Assessment:   This is a routine wellness examination for [name] [name].  Hearing/Vision screen Hearing Screening - Comments:: Patient states she hears ok except if there is a lot of noise around her. Declines referral. Vision  Screening - Comments:: Wears rx glasses - up to date with routine eye exams  Patient was seeing Dr. Daphine Deutscher  at [facility] in [place]. He has retired. She states office staff advised her that they will contact her when they have a new provider in the office. Declines referral to establish at another office, including [place].     Goals Addressed             This Visit's Progress    Patient Stated       Ride a bicycle again. Improve my health       Depression Screen    01/26/2024    8:59 AM 09/10/2023   11:30 AM 02/17/2023    3:11 PM 01/19/2023   10:25 AM 01/19/2023   10:22 AM 10/31/2022   10:32 AM 10/27/2022    1:16 PM  PHQ 2/9 Scores  PHQ - 2 Score 0 0 0 0 0 0 0  PHQ- 9 Score 0          Fall Risk    01/26/2024    8:58 AM 09/10/2023   11:30 AM 02/17/2023    3:10 PM 01/19/2023   10:25 AM 10/31/2022   10:31 AM  Fall Risk   Falls in the past year? 0 0 0 0 0  Number falls in past yr: 0 0 0 0 0  Injury with Fall? 0 0 0 0 0  Risk for fall due to : No Fall Risks No Fall Risks No Fall Risks No Fall Risks No Fall Risks  Follow up Falls prevention discussed;Falls evaluation completed Falls evaluation completed Falls evaluation completed Falls evaluation completed Falls evaluation completed    MEDICARE RISK AT HOME: Medicare Risk at Home Any stairs in or around the home?: No If so, are there any without handrails?: No Home free of loose throw rugs in walkways, pet beds, electrical cords, etc?: Yes Adequate lighting in your home to reduce risk of falls?: Yes Life alert?: No Use of a cane, walker or w/c?: No Grab bars in the bathroom?: No Shower chair or bench in shower?: Yes Elevated toilet seat or a handicapped toilet?: No  TIMED UP AND GO:  Was the test performed?  No    Cognitive Function:    01/19/2023   10:26 AM  MMSE - Mini Mental State Exam  Not completed: Unable to complete        01/26/2024    8:52 AM 01/19/2023   10:26 AM 01/03/2022    1:31 PM 01/01/2021    9:40 AM   6CIT Screen  What Year? 0 points 0 points 0 points 0 points  What month? 0 points 0 points 0 points 0 points  What time? 0 points 0 points 0 points 0 points  Count back from 20 0 points 0 points 0 points 0 points  Months in reverse 0 points 0 points 0 points 0 points  Repeat phrase 0 points 0 points 0 points 0 points  Total Score 0 points 0 points 0 points 0 points    Immunizations Immunization History  Administered Date(s) Administered   Influenza, High Dose Seasonal PF 11/12/2017, 08/13/2018   Influenza-Unspecified 11/12/2011, 11/03/2012, 11/02/2013, 09/20/2014, 09/19/2015, 09/23/2016, 08/18/2019   Pneumococcal Conjugate-13 09/20/2014   Pneumococcal Polysaccharide-23 03/18/2016    TDAP status: Due, Education has been provided regarding the importance of this vaccine. Advised may receive this vaccine at local pharmacy or Health Dept. Aware to provide a copy of the vaccination record if obtained from local pharmacy or Health Dept. Verbalized acceptance and understanding.  Flu Vaccine status: Due, Education has been provided regarding the importance  of this vaccine. Advised may receive this vaccine at local pharmacy or Health Dept. Aware to provide a copy of the vaccination record if obtained from local pharmacy or Health Dept. Verbalized acceptance and understanding.  Pneumococcal vaccine status: Up to date  Covid-19 vaccine status: Completed vaccines  Qualifies for Shingles Vaccine? Yes   Zostavax completed No   Shingrix Completed?: No.    Education has been provided regarding the importance of this vaccine. Patient has been advised to call insurance company to determine out of pocket expense if they have not yet received this vaccine. Advised may also receive vaccine at local pharmacy or Health Dept. Verbalized acceptance and understanding.  Screening Tests Health Maintenance  Topic Date Due   DEXA SCAN  Never done   DTaP/Tdap/Td (1 - Tdap) Never done   Zoster Vaccines-  Shingrix (1 of 2) Never done   OPHTHALMOLOGY EXAM  11/21/2022   Medicare Annual Wellness (AWV)  01/20/2024   INFLUENZA VACCINE  03/14/2024 (Originally 07/16/2023)   HEMOGLOBIN A1C  04/11/2024   FOOT EXAM  06/07/2024   Diabetic kidney evaluation - Urine ACR  06/22/2024   Diabetic kidney evaluation - eGFR measurement  08/31/2024   Pneumonia Vaccine 13+ Years old  Completed   HPV VACCINES  Aged Out   COVID-19 Vaccine  Discontinued    Health Maintenance  Health Maintenance Due  Topic Date Due   DEXA SCAN  Never done   DTaP/Tdap/Td (1 - Tdap) Never done   Zoster Vaccines- Shingrix (1 of 2) Never done   OPHTHALMOLOGY EXAM  11/21/2022   Medicare Annual Wellness (AWV)  01/20/2024    Colorectal cancer screening: No longer required.   Mammogram status: Ordered 01/26/2024. Pt provided with contact info and advised to call to schedule appt.   Bone Density status: Ordered 01/26/2024. Pt provided with contact info and advised to call to schedule appt.  Lung Cancer Screening: (Low Dose CT Chest recommended if Age 34-80 years, 20 pack-year currently smoking OR have quit w/in 15years.) does not qualify.   Lung Cancer Screening Referral: na  Additional Screening:  Hepatitis C Screening: does not qualify;  Vision Screening: Recommended annual ophthalmology exams for early detection of glaucoma and other disorders of the eye. Is the patient up to date with their annual eye exam?  No  Who is the provider or what is the name of the office in which the patient attends annual eye exams? [facility] If pt is not established with a provider, would they like to be referred to a provider to establish care? No .   Dental Screening: Recommended annual dental exams for proper oral hygiene  Diabetic Foot Exam: Diabetic Foot Exam: Completed 06/08/2023  Community Resource Referral / Chronic Care Management: CRR required this visit?  No   CCM required this visit?  No     Plan:     I have  personally reviewed and noted the following in the patient's chart:   Medical and social history Use of alcohol, tobacco or illicit drugs  Current medications and supplements including opioid prescriptions. Patient is not currently taking opioid prescriptions. Functional ability and status Nutritional status Physical activity Advanced directives List of other physicians Hospitalizations, surgeries, and ER visits in previous 12 months Vitals Screenings to include cognitive, depression, and falls Referrals and appointments  In addition, I have reviewed and discussed with patient certain preventive protocols, quality metrics, and best practice recommendations. A written personalized care plan for preventive services as well as  general preventive health recommendations were provided to patient.     Jordan Hawks Haddon Fyfe, CMA   01/26/2024   After Visit Summary: (Mail) Due to this being a telephonic visit, the after visit summary with patients personalized plan was offered to patient via mail   Nurse Notes: see routing comment

## 2024-01-26 NOTE — Patient Instructions (Signed)
[name] [name] , Thank you for taking time to come for your Medicare Wellness Visit. I appreciate your ongoing commitment to your health goals. Please review the following plan we discussed and let me know if I can assist you in the future.   Referrals/Orders/Follow-Ups/Clinician Recommendations:  Next Medicare Annual Wellness Visit: January 30, 2025 at 8:00 am telephone visit  You have an order for:  []   2D Mammogram  [x]   3D Mammogram  [x]   Bone Density   []   Lung Cancer Screening  Please call for appointment:   [facility] at [facility] [address] [place], [place] [address] [phone]  Make sure to wear two-piece clothing.  No lotions powders or deodorants the day of the appointment Make sure to bring picture ID and insurance card.  Bring list of medications you are currently taking including any supplements.   Schedule your [place] screening mammogram through MyChart!   Log into your MyChart account.  Go to 'Visit' (or 'Appointments' if on mobile App) --> Schedule an Appointment  Under 'Select a Reason for Visit' choose the Mammogram Screening option.  Complete the pre-visit questions and select the time and place that best fits your schedule.    This is a list of the screening recommended for you and due dates:  Health Maintenance  Topic Date Due   DEXA scan (bone density measurement)  Never done   Mammogram  Never done   DTaP/Tdap/Td vaccine (1 - Tdap) Never done   Zoster (Shingles) Vaccine (1 of 2) Never done   Eye exam for diabetics  11/21/2022   Flu Shot  03/14/2024*   Hemoglobin A1C  04/11/2024   Complete foot exam   06/07/2024   Yearly kidney health urinalysis for diabetes  06/22/2024   Yearly kidney function blood test for diabetes  08/31/2024   Medicare Annual Wellness Visit  01/25/2025   Pneumonia Vaccine  Completed   HPV Vaccine  Aged Out   COVID-19 Vaccine  Discontinued  *Topic was postponed. The date shown is not the  original due date.    Advanced directives: (ACP Link)Information on Advanced Care Planning can be found at [facility] Advance Health Care Directives Advance Health Care Directives ([email or web address]   Next Medicare Annual Wellness Visit scheduled for next year: yes  Preventive Care 65 Years and Older, Female Preventive care refers to lifestyle choices and visits with your health care provider that can promote health and wellness. Preventive care visits are also called wellness exams. What can I expect for my preventive care visit? Counseling Your health care provider may ask you questions about your: Medical history, including: Past medical problems. Family medical history. Pregnancy and menstrual history. History of falls. Current health, including: Memory and ability to understand (cognition). Emotional well-being. Home life and relationship well-being. Sexual activity and sexual health. Lifestyle, including: Alcohol, nicotine or tobacco, and drug use. Access to firearms. Diet, exercise, and sleep habits. Work and work Astronomer. Sunscreen use. Safety issues such as seatbelt and bike helmet use. Physical exam Your health care provider will check your: Height and weight. These may be used to calculate your BMI (body mass index). BMI is a measurement that tells if you are at a healthy weight. Waist circumference. This measures the distance around your waistline. This measurement also tells if you are at a healthy weight and may help predict your risk of certain diseases, such as type 2 diabetes and high blood pressure. Heart rate and  blood pressure. Body temperature. Skin for abnormal spots. What immunizations do I need?  Vaccines are usually given at various ages, according to a schedule. Your health care provider will recommend vaccines for you based on your age, medical history, and lifestyle or other factors, such as travel or where you work. What tests do  I need? Screening Your health care provider may recommend screening tests for certain conditions. This may include: Lipid and cholesterol levels. Hepatitis C test. Hepatitis B test. HIV (human immunodeficiency virus) test. STI (sexually transmitted infection) testing, if you are at risk. Lung cancer screening. Colorectal cancer screening. Diabetes screening. This is done by checking your blood sugar (glucose) after you have not eaten for a while (fasting). Mammogram. Talk with your health care provider about how often you should have regular mammograms. BRCA-related cancer screening. This may be done if you have a family history of breast, ovarian, tubal, or peritoneal cancers. Bone density scan. This is done to screen for osteoporosis. Talk with your health care provider about your test results, treatment options, and if necessary, the need for more tests. Follow these instructions at home: Eating and drinking  Eat a diet that includes fresh fruits and vegetables, whole grains, lean protein, and low-fat dairy products. Limit your intake of foods with high amounts of sugar, saturated fats, and salt. Take vitamin and mineral supplements as recommended by your health care provider. Do not drink alcohol if your health care provider tells you not to drink. If you drink alcohol: Limit how much you have to 0-1 drink a day. Know how much alcohol is in your drink. In the [place] one drink equals one 12 oz bottle of beer (355 mL), one 5 oz glass of wine (148 mL), or one 1 oz glass of hard liquor (44 mL). Lifestyle Brush your teeth every morning and night with fluoride toothpaste. Floss one time each day. Exercise for at least 30 minutes 5 or more days each week. Do not use any products that contain nicotine or tobacco. These products include cigarettes, chewing tobacco, and vaping devices, such as e-cigarettes. If you need help quitting, ask your health care provider. Do not use drugs. If you are  sexually active, practice safe sex. Use a condom or other form of protection in order to prevent STIs. Take aspirin only as told by your health care provider. Make sure that you understand how much to take and what form to take. Work with your health care provider to find out whether it is safe and beneficial for you to take aspirin daily. Ask your health care provider if you need to take a cholesterol-lowering medicine (statin). Find healthy ways to manage stress, such as: Meditation, yoga, or listening to music. Journaling. Talking to a trusted person. Spending time with friends and family. Minimize exposure to UV radiation to reduce your risk of skin cancer. Safety Always wear your seat belt while driving or riding in a vehicle. Do not drive: If you have been drinking alcohol. Do not ride with someone who has been drinking. When you are tired or distracted. While texting. If you have been using any mind-altering substances or drugs. Wear a helmet and other protective equipment during sports activities. If you have firearms in your house, make sure you follow all gun safety procedures. What's next? Visit your health care provider once a year for an annual wellness visit. Ask your health care provider how often you should have your eyes and teeth checked. Stay up to date  on all vaccines. This information is not intended to replace advice given to you by your health care provider. Make sure you discuss any questions you have with your health care provider. Document Revised: 05/29/2021 Document Reviewed: 05/29/2021 Elsevier Patient Education  2024 [employer].  Understanding Your Risk for Falls Millions of people have serious injuries from falls each year. It is important to understand your risk of falling. Talk with your health care provider about your risk and what you can do to lower it. If you do have a serious fall, make sure to tell your provider. Falling once raises your risk of  falling again. How can falls affect me? Serious injuries from falls are common. These include: Broken bones, such as hip fractures. Head injuries, such as traumatic brain injuries (TBI) or concussions. A fear of falling can cause you to avoid activities and stay at home. This can make your muscles weaker and raise your risk for a fall. What can increase my risk? There are a number of risk factors that increase your risk for falling. The more risk factors you have, the higher your risk of falling. Serious injuries from a fall happen most often to people who are older than 83 years old. Teenagers and young adults ages 76-29 are also at higher risk. Common risk factors include: Weakness in the lower body. Being generally weak or confused due to long-term (chronic) illness. Dizziness or balance problems. Poor vision. Medicines that cause dizziness or drowsiness. These may include: Medicines for your blood pressure, heart, anxiety, insomnia, or swelling (edema). Pain medicines. Muscle relaxants. Other risk factors include: Drinking alcohol. Having had a fall in the past. Having foot pain or wearing improper footwear. Working at a dangerous job. Having any of the following in your home: Tripping hazards, such as floor clutter or loose rugs. Poor lighting. Pets. Having dementia or memory loss. What actions can I take to lower my risk of falling?     Physical activity Stay physically fit. Do strength and balance exercises. Consider taking a regular class to build strength and balance. Yoga and tai chi are good options. Vision Have your eyes checked every year and your prescription for glasses or contacts updated as needed. Shoes and walking aids Wear non-skid shoes. Wear shoes that have rubber soles and low heels. Do not wear high heels. Do not walk around the house in socks or slippers. Use a cane or walker as told by your provider. Home safety Attach secure railings on both sides  of your stairs. Install grab bars for your bathtub, shower, and toilet. Use a non-skid mat in your bathtub or shower. Attach bath mats securely with double-sided, non-slip rug tape. Use good lighting in all rooms. Keep a flashlight near your bed. Make sure there is a clear path from your bed to the bathroom. Use night-lights. Do not use throw rugs. Make sure all carpeting is taped or tacked down securely. Remove all clutter from walkways and stairways, including extension cords. Repair uneven or broken steps and floors. Avoid walking on icy or slippery surfaces. Walk on the grass instead of on icy or slick sidewalks. Use ice melter to get rid of ice on walkways in the winter. Use a cordless phone. Questions to ask your health care provider Can you help me check my risk for a fall? Do any of my medicines make me more likely to fall? Should I take a vitamin D supplement? What exercises can I do to improve my strength and  balance? Should I make an appointment to have my vision checked? Do I need a bone density test to check for weak bones (osteoporosis)? Would it help to use a cane or a walker? Where to find more information Centers for Disease Control and Prevention, STEADI: TonerPromos.no Community-Based Fall Prevention Programs: TonerPromos.no [employer] on Aging: BaseRingTones.pl Contact a health care provider if: You fall at home. You are afraid of falling at home. You feel weak, drowsy, or dizzy. This information is not intended to replace advice given to you by your health care provider. Make sure you discuss any questions you have with your health care provider. Document Revised: 08/04/2022 Document Reviewed: 08/04/2022 Elsevier Patient Education  2024 [employer].

## 2024-02-01 ENCOUNTER — Other Ambulatory Visit: Payer: Self-pay | Admitting: Internal Medicine

## 2024-02-05 ENCOUNTER — Ambulatory Visit: Payer: Medicare Other | Attending: Cardiology | Admitting: Cardiology

## 2024-02-05 ENCOUNTER — Encounter: Payer: Self-pay | Admitting: Cardiology

## 2024-02-05 VITALS — BP 124/62 | HR 47 | Ht 59.0 in | Wt 147.2 lb

## 2024-02-05 DIAGNOSIS — E782 Mixed hyperlipidemia: Secondary | ICD-10-CM | POA: Insufficient documentation

## 2024-02-05 DIAGNOSIS — I441 Atrioventricular block, second degree: Secondary | ICD-10-CM | POA: Diagnosis not present

## 2024-02-05 DIAGNOSIS — I25119 Atherosclerotic heart disease of native coronary artery with unspecified angina pectoris: Secondary | ICD-10-CM | POA: Insufficient documentation

## 2024-02-05 DIAGNOSIS — N1832 Chronic kidney disease, stage 3b: Secondary | ICD-10-CM | POA: Diagnosis not present

## 2024-02-05 NOTE — Patient Instructions (Addendum)

## 2024-02-05 NOTE — Progress Notes (Signed)
Cardiology Office Note  Date: 02/05/2024   ID: [name] [name], DOB 05/14/1941, MRN [id number]  History of Present Illness: [name] [name] is an 83 y.o. female last seen in October 2024 by Ms. Philis Nettle NP, I reviewed the note.  She is here for a routine visit.  She does not report any angina or interval nitroglycerin use.  Also no sudden dizziness or syncope.  She remains functional with ADLs.  Tells me that she does chair exercises occasionally at home to strengthen her legs and arms.  She does have an automatic home blood pressure cuff, I went over the memory and her systolics are generally running from the 120s to the 150s.  She has been hesitant to start any other antihypertensive medications reporting multiple prior allergies, not all of which she can remember.  Dr. Durwin Nora recommended low-dose losartan at his last visit, however she was not comfortable starting this.  I reviewed her interval lab work.  Physical Exam: VS:  BP 124/62   Pulse (!) 47   Ht 4\' 11"  (1.499 m)   Wt 147 lb 3.2 oz (66.8 kg)   SpO2 98%   BMI 29.73 kg/m , BMI Body mass index is 29.73 kg/m.  Wt Readings from Last 3 Encounters:  02/05/24 147 lb 3.2 oz (66.8 kg)  01/26/24 145 lb (65.8 kg)  12/01/23 147 lb (66.7 kg)    General: Patient appears comfortable at rest. HEENT: Conjunctiva and lids normal. Neck: Supple, no elevated JVP or carotid bruits. Lungs: Clear to auscultation, nonlabored breathing at rest. Cardiac: Regular rate and rhythm, no S3 or significant systolic murmur. Extremities: No pitting edema.  ECG:  An ECG dated 09/01/2023 was personally reviewed today and demonstrated:  Sinus rhythm with second-degree type I AV block, nonspecific ST abnormalities.  Labwork: 09/01/2023: ALT 21; AST 20; BUN 28; Creatinine, Ser 1.20; Hemoglobin 11.6; Platelets 190; Potassium 4.5; Sodium 137     Component Value Date/Time   CHOL 100 04/15/2023 1414   CHOL 121 11/21/2021 1115   TRIG 102 04/15/2023 1414   HDL 37  (L) 04/15/2023 1414   HDL 39 (L) 11/21/2021 1115   CHOLHDL 2.7 04/15/2023 1414   VLDL 20 04/15/2023 1414   LDLCALC 43 04/15/2023 1414   LDLCALC 55 11/21/2021 1115   LDLCALC 109 (H) 03/07/2020 1137   Other Studies Reviewed Today:  No interval cardiac testing for review today.  Assessment and Plan:  1.  Multivessel CAD status post CABG in 2021 with LIMA to LAD, SVG to distal RCA, and SVG to OM1.  Echocardiogram in May 2024 revealed LVEF 65 to 70%.  She does not report any angina or interval nitroglycerin use.  Continue aspirin 81 mg daily, Lipitor 40 mg daily, and as needed nitroglycerin.  2.  Conduction system disease.  Intermittent bradycardia with episodic second-degree type I heart block and dropped sinus beats with 3.2-second pause by cardiac monitor in 2023.  She is not on any AV nodal blockers and has been observed over time.  No sudden dizziness or syncope.  3.  Primary hypertension.  Blood pressure looks good today in the absence of antihypertensive therapy.  She does have elevated systolics per review of home checks, but remains hesitant to start any new antihypertensive medications.  4.  CKD stage IIIb, creatinine 1.2 with GFR 45 in September 2024.  5.  Mixed hyperlipidemia, LDL 43 in May 2024.  Continue Lipitor 40 mg daily.  Disposition:  Follow up  6 months.  Signed,  Jonelle Sidle, M.D., F.A.C.C. [facility]

## 2024-02-09 ENCOUNTER — Telehealth: Payer: Self-pay | Admitting: Cardiology

## 2024-02-09 NOTE — Telephone Encounter (Signed)
 Patient is curious if we are okay with her taking Losartan per Dr.Dixon, she states he is recommending her starting this medication. Although patient is wanting to ask Dr.McDowell if it is okay for her to take this medication from a cardiac standpoint before she starts medication.

## 2024-02-09 NOTE — Telephone Encounter (Signed)
 Left patient detailed message per DPR.  Routed to Dr.Dixon as well.

## 2024-02-09 NOTE — Telephone Encounter (Signed)
 Pt c/o medication issue:  1. Name of Medication: lostarn   2. How are you currently taking this medication (dosage and times per day)?    3. Are you having a reaction (difficulty breathing--STAT)? no  4. What is your medication issue? Calling to see if its okay for her to take this medication. Please advise    Patient states if she doesn't answer, please call daughter phone.

## 2024-02-16 ENCOUNTER — Encounter: Payer: Self-pay | Admitting: Nurse Practitioner

## 2024-02-16 ENCOUNTER — Ambulatory Visit (INDEPENDENT_AMBULATORY_CARE_PROVIDER_SITE_OTHER): Payer: Medicare Other | Admitting: Nurse Practitioner

## 2024-02-16 VITALS — BP 122/58 | HR 44 | Ht 59.0 in | Wt 146.0 lb

## 2024-02-16 DIAGNOSIS — I10 Essential (primary) hypertension: Secondary | ICD-10-CM | POA: Diagnosis not present

## 2024-02-16 DIAGNOSIS — E559 Vitamin D deficiency, unspecified: Secondary | ICD-10-CM | POA: Diagnosis not present

## 2024-02-16 DIAGNOSIS — E1121 Type 2 diabetes mellitus with diabetic nephropathy: Secondary | ICD-10-CM | POA: Diagnosis not present

## 2024-02-16 DIAGNOSIS — E782 Mixed hyperlipidemia: Secondary | ICD-10-CM | POA: Diagnosis not present

## 2024-02-16 DIAGNOSIS — Z7984 Long term (current) use of oral hypoglycemic drugs: Secondary | ICD-10-CM | POA: Diagnosis not present

## 2024-02-16 DIAGNOSIS — N1832 Chronic kidney disease, stage 3b: Secondary | ICD-10-CM | POA: Diagnosis not present

## 2024-02-16 DIAGNOSIS — R809 Proteinuria, unspecified: Secondary | ICD-10-CM | POA: Diagnosis not present

## 2024-02-16 DIAGNOSIS — E1122 Type 2 diabetes mellitus with diabetic chronic kidney disease: Secondary | ICD-10-CM | POA: Diagnosis not present

## 2024-02-16 DIAGNOSIS — E1129 Type 2 diabetes mellitus with other diabetic kidney complication: Secondary | ICD-10-CM | POA: Diagnosis not present

## 2024-02-16 DIAGNOSIS — Z905 Acquired absence of kidney: Secondary | ICD-10-CM

## 2024-02-16 LAB — POCT GLYCOSYLATED HEMOGLOBIN (HGB A1C): Hemoglobin A1C: 7.8 % — AB (ref 4.0–5.6)

## 2024-02-16 MED ORDER — LINAGLIPTIN 5 MG PO TABS: 5.0000 mg | ORAL_TABLET | Freq: Every day | ORAL | 3 refills | Status: DC

## 2024-02-16 MED ORDER — METFORMIN HCL 500 MG PO TABS: 500.0000 mg | ORAL_TABLET | Freq: Every day | ORAL | 1 refills | Status: DC

## 2024-02-16 NOTE — Progress Notes (Signed)
 Endocrinology Follow Up Note       02/16/2024, 11:02 AM   Subjective:    Patient ID: [name] [name], female    DOB: 11-07-1941.  [name] [name] is being seen in follow up after being seen in consultation for management of currently uncontrolled symptomatic diabetes requested by  Billie Lade, MD.   Past Medical History:  Diagnosis Date   Anxiety    CAD (coronary artery disease)    Multivessel status post CABG June 2021 - LIMA to LAD, SVG to distal RCA, SVG to OM1   Chronic obstructive pulmonary disease, unspecified (HCC)    Depression    Essential hypertension    GERD    History of pneumonia    Incarcerated ventral hernia    Osteoarthritis    Type 2 diabetes mellitus (HCC)     Past Surgical History:  Procedure Laterality Date   CATARACT EXTRACTION Bilateral 2023   CORONARY ARTERY BYPASS GRAFT N/A 05/22/2020   Procedure: CORONARY ARTERY BYPASS GRAFTING (CABG) x3 LIMA TO LAD, SVG TO OM1, SVG TO DISTAL RIGHT;  Surgeon: Corliss Skains, MD;  Location: [facility] OR;  Service: Open Heart Surgery;  Laterality: N/A;   LEFT HEART CATH AND CORONARY ANGIOGRAPHY N/A 05/21/2020   Procedure: LEFT HEART CATH AND CORONARY ANGIOGRAPHY;  Surgeon: [place], Peter M, MD;  Location: [facility] INVASIVE CV LAB;  Service: Cardiovascular;  Laterality: N/A;   NEPHRECTOMY     TEE WITHOUT CARDIOVERSION N/A 05/22/2020   Procedure: TRANSESOPHAGEAL ECHOCARDIOGRAM (TEE);  Surgeon: Corliss Skains, MD;  Location: [facility] OR;  Service: Open Heart Surgery;  Laterality: N/A;   VEIN HARVEST Right 05/22/2020   Procedure: Open Vein Harvest;  Surgeon: Corliss Skains, MD;  Location: [facility] OR;  Service: Open Heart Surgery;  Laterality: Right;   VENTRAL HERNIA REPAIR  09/01/2017   mistaken entry    Social History   Socioeconomic History   Marital status: Widowed    Spouse name: Not on file   Number of children: 1   Years of education: 48    Highest education level: 12th grade  Occupational History   Occupation: Retired  Tobacco Use   Smoking status: Never    Passive exposure: Never   Smokeless tobacco: Never  Vaping Use   Vaping status: Never Used  Substance and Sexual Activity   Alcohol use: No   Drug use: No   Sexual activity: Not Currently  Other Topics Concern   Not on file  Social History Narrative   Lives alone.  Has 1 daughter      Enjoys reading and cooking      Eats all food groups   Caffeine, some coffee   Water reports drinking pretty well throughout the day      Social Drivers of Health   Financial Resource Strain: Low Risk  (01/26/2024)   Overall Financial Resource Strain (CARDIA)    Difficulty of Paying Living Expenses: Not hard at all  Food Insecurity: No Food Insecurity (01/26/2024)   Hunger Vital Sign    Worried About Running Out of Food in the Last Year: Never true    Ran Out of Food in the Last Year: Never true  Transportation Needs: No Transportation Needs (01/26/2024)   PRAPARE - Administrator, Civil Service (Medical): No    Lack of Transportation (Non-Medical): No  Physical Activity: Insufficiently Active (01/26/2024)   Exercise Vital Sign    Days of Exercise per Week: 3 days    Minutes of Exercise per Session: 20 min  Stress: No Stress Concern Present (01/26/2024)   [employer] of Occupational Health - Occupational Stress Questionnaire    Feeling of Stress : Not at all  Social Connections: Moderately Isolated (01/26/2024)   Social Connection and Isolation Panel [NHANES]    Frequency of Communication with Friends and Family: More than three times a week    Frequency of Social Gatherings with Friends and Family: More than three times a week    Attends Religious Services: More than 4 times per year    Active Member of [employer] or Organizations: No    Attends Banker Meetings: Never    Marital Status: Widowed    Family History  Problem Relation Age of Onset    CAD Father    Heart failure Sister    CAD Daughter    Heart disease Daughter     Outpatient Encounter Medications as of 02/16/2024  Medication Sig   Accu-Chek Softclix Lancets lancets Use as instructed   acetaminophen (TYLENOL) 325 MG tablet Take 650 mg by mouth every 6 (six) hours as needed for moderate pain.   aspirin EC 81 MG tablet Take 1 tablet (81 mg total) by mouth daily. Swallow whole.   atorvastatin (LIPITOR) 40 MG tablet Take 1 tablet (40 mg total) by mouth at bedtime.   blood glucose meter kit and supplies Dispense based on patient and insurance preference. Use up to four times daily as directed. (FOR ICD-10 E10.9, E11.9).   Blood Pressure Monitor DEVI 1 each by Does not apply route daily.   cetirizine (ZYRTEC) 10 MG tablet Take 1 tablet (10 mg total) by mouth daily as needed (allergies).   famotidine (PEPCID) 20 MG tablet take 1 tablet (20 milligram total) by mouth daily.   fish oil-omega-3 fatty acids 1000 MG capsule Take 1 g by mouth daily.   glucose blood (ACCU-CHEK GUIDE) test strip Use as instructed to monitor glucose twice daily   glucose blood test strip Use as instructed to monitor glucose twice daily   guaiFENesin-dextromethorphan (ROBITUSSIN DM) 100-10 MG/5ML syrup Take 5 mLs by mouth every 4 (four) hours as needed for cough.   Multiple Vitamin (MULITIVITAMIN WITH MINERALS) TABS Take 1 tablet by mouth daily.   Multiple Vitamins-Minerals (AIRBORNE GUMMIES PO) Take by mouth daily.   nitroGLYCERIN (NITROSTAT) 0.4 MG SL tablet Place 1 tablet (0.4 mg total) under the tongue every 5 (five) minutes x 3 doses as needed for chest pain (if no relief after 3rd dose, proceed to the ED or call 911).   polyethylene glycol (MIRALAX / GLYCOLAX) 17 g packet Take 17 g by mouth daily as needed for mild constipation or moderate constipation.   [DISCONTINUED] linagliptin (TRADJENTA) 5 MG TABS tablet Take 1 tablet (5 mg total) by mouth daily.   [DISCONTINUED] metFORMIN (GLUCOPHAGE) 500 MG  tablet Take 1 tablet (500 mg total) by mouth daily with breakfast.   linagliptin (TRADJENTA) 5 MG TABS tablet Take 1 tablet (5 mg total) by mouth daily.   metFORMIN (GLUCOPHAGE) 500 MG tablet Take 1 tablet (500 mg total) by mouth daily with breakfast.   No facility-administered encounter medications on file as of 02/16/2024.  ALLERGIES: Allergies  Allergen Reactions   Iodinated Contrast Media Other (See Comments)    Unknown reaction. Due to kidney issues. Do not use Unknown reaction. Due to kidney issues. Do not use   Codeine Rash   Morphine And Codeine Other (See Comments) and Rash    Unknown Reaction Unknown Reaction   Other     Pt has multiple allergies per pt but unsure of all.    Penicillins Other (See Comments)    Unknown reaction Unknown reaction    VACCINATION STATUS: Immunization History  Administered Date(s) Administered   Influenza, High Dose Seasonal PF 11/12/2017, 08/13/2018   Influenza-Unspecified 11/12/2011, 11/03/2012, 11/02/2013, 09/20/2014, 09/19/2015, 09/23/2016, 08/18/2019   Pneumococcal Conjugate-13 09/20/2014   Pneumococcal Polysaccharide-23 03/18/2016    Diabetes She presents for her follow-up diabetic visit. She has type 2 diabetes mellitus. Onset time: Was diagnosed at approx age of 38. Her disease course has been stable. There are no hypoglycemic associated symptoms. Associated symptoms include blurred vision and foot paresthesias. Pertinent negatives for diabetes include no fatigue, no polydipsia and no polyuria. There are no hypoglycemic complications. Symptoms are stable. Diabetic complications include heart disease, nephropathy and peripheral neuropathy. Risk factors for coronary artery disease include diabetes mellitus, dyslipidemia, hypertension, stress, tobacco exposure, sedentary lifestyle and post-menopausal. Current diabetic treatment includes oral agent (dual therapy). She is compliant with treatment most of the time. Her weight is fluctuating  minimally. She is following a generally healthy diet. When asked about meal planning, she reported none. She has not had a previous visit with a dietitian. She rarely participates in exercise. Her home blood glucose trend is fluctuating minimally. Her breakfast blood glucose range is generally 140-180 mg/dl. (She presents today with her meter and logs showing mostly at goal fasting glycemic profile.  Her POCT A1c today is 7.8%, increasing from last visit of 7.2%.   She notes she cheats on her diet every once in a while but has plans to exercise more now that the weather is getting warmer.  She notes she has been more stressed in the last several months.) An ACE inhibitor/angiotensin II receptor blocker is not being taken. She does not see a podiatrist.Eye exam is current.  Hyperlipidemia This is a chronic problem. The current episode started more than 1 year ago. The problem is uncontrolled. Recent lipid tests were reviewed and are variable. Exacerbating diseases include chronic renal disease and diabetes. Factors aggravating her hyperlipidemia include fatty foods and beta blockers. Current antihyperlipidemic treatment includes statins. The current treatment provides mild improvement of lipids. Compliance problems include adherence to diet and adherence to exercise.  Risk factors for coronary artery disease include diabetes mellitus, dyslipidemia, hypertension, post-menopausal, a sedentary lifestyle and stress.  Hypertension This is a chronic problem. The current episode started more than 1 year ago. The problem has been waxing and waning since onset. The problem is controlled. Associated symptoms include blurred vision. There are no associated agents to hypertension. Risk factors for coronary artery disease include diabetes mellitus, dyslipidemia, sedentary lifestyle, smoking/tobacco exposure, stress and post-menopausal state. Past treatments include beta blockers. The current treatment provides mild  improvement. There are no compliance problems.  Hypertensive end-organ damage includes kidney disease and CAD/MI. Identifiable causes of hypertension include chronic renal disease.    Review of systems  Constitutional: + stable body weight,  current Body mass index is 29.49 kg/m. , no fatigue, no subjective hyperthermia, no subjective hypothermia Eyes: no blurry vision, no xerophthalmia ENT: no sore throat, no nodules palpated in throat,  no dysphagia/odynophagia, no hoarseness Cardiovascular: no chest pain, no shortness of breath, no palpitations, no leg swelling Respiratory: no cough, no shortness of breath Gastrointestinal: no nausea/vomiting/diarrhea, large hernia located to left side-tender to touch Musculoskeletal: no muscle/joint aches Skin: no rashes, no hyperemia Neurological: no tremors, no numbness, no tingling, no dizziness Psychiatric: no depression, no anxiety  Objective:     BP (!) 122/58 (BP Location: Left Arm, Patient Position: Sitting, Cuff Size: Large)   Pulse (!) 44   Ht 4\' 11"  (1.499 m)   Wt 146 lb (66.2 kg)   BMI 29.49 kg/m   Wt Readings from Last 3 Encounters:  02/16/24 146 lb (66.2 kg)  02/05/24 147 lb 3.2 oz (66.8 kg)  01/26/24 145 lb (65.8 kg)     BP Readings from Last 3 Encounters:  02/16/24 (!) 122/58  02/05/24 124/62  12/01/23 137/73      Physical Exam- Limited  Constitutional:  Body mass index is 29.49 kg/m. , not in acute distress, anxious state of mind Eyes:  EOMI, no exophthalmos Musculoskeletal: she does have abnormal bulge in LLQ of abdomen stretching around her back (inoperable hernia), strength intact in all four extremities, no gross restriction of joint movements Skin:  no rashes, no hyperemia Neurological: no tremor with outstretched hands   CMP ( most recent) CMP     Component Value Date/Time   NA 137 09/01/2023 1801   NA 141 11/21/2021 1115   K 4.5 09/01/2023 1801   CL 105 09/01/2023 1801   CO2 25 09/01/2023 1801    GLUCOSE 145 (H) 09/01/2023 1801   BUN 28 (H) 09/01/2023 1801   BUN 27 11/21/2021 1115   CREATININE 1.20 (H) 09/01/2023 1801   CREATININE 1.28 (H) 03/07/2020 1137   CALCIUM 8.6 (L) 09/01/2023 1801   CALCIUM 9.2 06/23/2023 1403   PROT 7.0 09/01/2023 1801   PROT 6.8 11/21/2021 1115   ALBUMIN 3.5 09/01/2023 1801   ALBUMIN 4.5 11/21/2021 1115   AST 20 09/01/2023 1801   ALT 21 09/01/2023 1801   ALKPHOS 46 09/01/2023 1801   BILITOT 0.5 09/01/2023 1801   BILITOT 0.3 11/21/2021 1115   GFRNONAA 45 (L) 09/01/2023 1801   GFRNONAA 40 (L) 03/07/2020 1137   GFRAA 48 (L) 12/28/2020 0955   GFRAA 46 (L) 03/07/2020 1137     Diabetic Labs (most recent): Lab Results  Component Value Date   HGBA1C 7.8 (A) 02/16/2024   HGBA1C 7.2 (A) 10/12/2023   HGBA1C 6.9 (A) 06/04/2023   MICROALBUR 30 02/27/2021   MICROALBUR 2.4 03/07/2020   MICROALBUR 0.9 08/13/2018     Lipid Panel ( most recent) Lipid Panel     Component Value Date/Time   CHOL 100 04/15/2023 1414   CHOL 121 11/21/2021 1115   TRIG 102 04/15/2023 1414   HDL 37 (L) 04/15/2023 1414   HDL 39 (L) 11/21/2021 1115   CHOLHDL 2.7 04/15/2023 1414   VLDL 20 04/15/2023 1414   LDLCALC 43 04/15/2023 1414   LDLCALC 55 11/21/2021 1115   LDLCALC 109 (H) 03/07/2020 1137   LABVLDL 27 11/21/2021 1115      Lab Results  Component Value Date   TSH 2.469 05/21/2022   TSH 3.040 08/22/2021   TSH 2.02 11/17/2019   FREET4 1.25 08/22/2021           Assessment & Plan:   1) Controlled Type 2 Diabetes with CKD stage 3  - [name] [name] has currently uncontrolled symptomatic type 2 DM since 83 years of age.  She presents today with her meter and logs showing mostly at goal fasting glycemic profile.  Her POCT A1c today is 7.8%, increasing from last visit of 7.2%.   She notes she cheats on her diet every once in a while but has plans to exercise more now that the weather is getting warmer.  She notes she has been more stressed in the last several  months.  -Recent labs reviewed.  - I had a long discussion with her about the progressive nature of diabetes and the pathology behind its complications. -her diabetes is complicated by CAD and she remains at a high risk for more acute and chronic complications which include CAD, CVA, CKD, retinopathy, and neuropathy. These are all discussed in detail with her.  - Nutritional counseling repeated at each appointment due to patients tendency to fall back in to old habits.  - The patient admits there is a room for improvement in their diet and drink choices. -  Suggestion is made for the patient to avoid simple carbohydrates from their diet including Cakes, Sweet Desserts / Pastries, Ice Cream, Soda (diet and regular), Sweet Tea, Candies, Chips, Cookies, Sweet Pastries, Store Bought Juices, Alcohol in Excess of 1-2 drinks a day, Artificial Sweeteners, Coffee Creamer, and "Sugar-free" Products. This will help patient to have stable blood glucose profile and potentially avoid unintended weight gain.   - I encouraged the patient to switch to unprocessed or minimally processed complex starch and increased protein intake (animal or plant source), fruits, and vegetables.   - Patient is advised to stick to a routine mealtimes to eat 3 meals a day and avoid unnecessary snacks (to snack only to correct hypoglycemia).  - she will be scheduled with Norm Salt, RDN, CDE for diabetes education.  - I have approached her with the following individualized plan to manage  her diabetes and patient agrees:   Given her stable, at goal glycemic profile overall, she can continue her current medication regimen of Metformin 500 mg po once daily with breakfast, Tradjenta 5 mg po daily with breakfast.    -she is encouraged to continue monitoring blood glucose at least once daily, before breakfast, and to call the clinic if she has readings less than 70 or above 200 for 3 tests in a row.  - Specific targets for  A1c;   LDL, HDL,  and Triglycerides were discussed with the patient.  2) Blood Pressure /Hypertension:  her blood pressure is controlled to target for her age.   she is not currently on any antihypertensive medications at this time.  3) Lipids/Hyperlipidemia:    Review of her recent lipid panel from 04/15/23 showed controlled  LDL at 43.  she  is advised to continue Lipitor 40 mg daily at bedtime and Fish Oil supplement.  Side effects and precautions discussed with her.    4)  Weight/Diet:  her Body mass index is 29.49 kg/m.  -  clearly complicating her diabetes care.   she is a candidate for weight loss. I discussed with her the fact that loss of 5 - 10% of her  current body weight will have the most impact on her diabetes management.  Exercise, and detailed carbohydrates information provided  -  detailed on discharge instructions.  5) Hypervitaminosis of vitamin D Her most recent vitamin D level on 08/22/21 was high at 113.  She was taking several supplements that contained vitamin D in it.  The nephrologist asked her to stop all vitamin D supplements.  6)  Chronic Care/Health Maintenance: -she is on Statin medications and is encouraged to initiate and continue to follow up with Ophthalmology, Dentist, Podiatrist at least yearly or according to recommendations, and advised to stay away from smoking. I have recommended yearly flu vaccine and pneumonia vaccine at least every 5 years; moderate intensity exercise for up to 150 minutes weekly; and sleep for at least 7 hours a day.  - she is advised to maintain close follow up with Durwin Nora Lucina Mellow, MD for primary care needs, as well as her other providers for optimal and coordinated care.  She did ask me today to write her a letter of exemption to wear safety belt in the car.  I did do this for her today as she has significant hernia stretching from LLQ of abdomen around her back which is tender to touch and would cause significant discomfort for  her.     I spent  46  minutes in the care of the patient today including review of labs from CMP, Lipids, Thyroid Function, Hematology (current and previous including abstractions from other facilities); face-to-face time discussing  her blood glucose readings/logs, discussing hypoglycemia and hyperglycemia episodes and symptoms, medications doses, her options of short and long term treatment based on the latest standards of care / guidelines;  discussion about incorporating lifestyle medicine;  and documenting the encounter. Risk reduction counseling performed per USPSTF guidelines to reduce obesity and cardiovascular risk factors.     Please refer to Patient Instructions for Blood Glucose Monitoring and Insulin/Medications Dosing Guide"  in media tab for additional information. Please  also refer to " Patient Self Inventory" in the Media  tab for reviewed elements of pertinent patient history.  [name] [name] participated in the discussions, expressed understanding, and voiced agreement with the above plans.  All questions were answered to her satisfaction. she is encouraged to contact clinic should she have any questions or concerns prior to her return visit.   Follow up plan: - Return in about 4 months (around 06/17/2024) for Diabetes F/U with A1c in office, No previsit labs.  Ronny Bacon, [facility] [address] [place], [place] [address] Phone: [phone] Fax: [phone]  02/16/2024, 11:02 AM

## 2024-02-18 DIAGNOSIS — R809 Proteinuria, unspecified: Secondary | ICD-10-CM | POA: Diagnosis not present

## 2024-02-18 DIAGNOSIS — I129 Hypertensive chronic kidney disease with stage 1 through stage 4 chronic kidney disease, or unspecified chronic kidney disease: Secondary | ICD-10-CM | POA: Diagnosis not present

## 2024-02-18 DIAGNOSIS — E1129 Type 2 diabetes mellitus with other diabetic kidney complication: Secondary | ICD-10-CM | POA: Diagnosis not present

## 2024-02-18 DIAGNOSIS — N1832 Chronic kidney disease, stage 3b: Secondary | ICD-10-CM | POA: Diagnosis not present

## 2024-03-01 ENCOUNTER — Other Ambulatory Visit: Payer: Self-pay | Admitting: Nurse Practitioner

## 2024-03-01 ENCOUNTER — Other Ambulatory Visit: Payer: Self-pay | Admitting: Internal Medicine

## 2024-03-04 ENCOUNTER — Ambulatory Visit (INDEPENDENT_AMBULATORY_CARE_PROVIDER_SITE_OTHER): Payer: Medicare Other | Admitting: Internal Medicine

## 2024-03-04 ENCOUNTER — Other Ambulatory Visit: Payer: Self-pay | Admitting: Internal Medicine

## 2024-03-04 ENCOUNTER — Other Ambulatory Visit: Payer: Self-pay | Admitting: Nurse Practitioner

## 2024-03-04 ENCOUNTER — Encounter: Payer: Self-pay | Admitting: Internal Medicine

## 2024-03-04 VITALS — BP 155/73 | HR 77 | Ht 59.0 in | Wt 148.8 lb

## 2024-03-04 DIAGNOSIS — I10 Essential (primary) hypertension: Secondary | ICD-10-CM

## 2024-03-04 DIAGNOSIS — N1832 Chronic kidney disease, stage 3b: Secondary | ICD-10-CM

## 2024-03-04 MED ORDER — LOSARTAN POTASSIUM 25 MG PO TABS: 25.0000 mg | ORAL_TABLET | Freq: Every day | ORAL | 1 refills | Status: DC

## 2024-03-04 NOTE — Progress Notes (Signed)
 Established Patient Office Visit  Subjective   Patient ID: [name] [name], female    DOB: 1941-10-18  Age: 83 y.o. MRN: [id number]  Chief Complaint  Patient presents with   Care Management    Three month follow up    [name] [name] returns to care today for routine follow-up.  She was last evaluated by me in December 2024.  No medication changes were made at that time.  We discussed low-dose losartan in the setting of CKD and she requested time to consider starting treatment.  51-month follow-up was arranged.  In the interim, she has been seen by cardiology and endocrinology for follow-up.  There have otherwise been no acute interval events. [name] [name] reports feeling well today.  Her chief concern is a billing discrepancy that she plans to discuss with her podiatry office.  She does not have any additional concerns to discuss today.  Past Medical History:  Diagnosis Date   Anxiety    CAD (coronary artery disease)    Multivessel status post CABG June 2021 - LIMA to LAD, SVG to distal RCA, SVG to OM1   Chronic obstructive pulmonary disease, unspecified (HCC)    Depression    Essential hypertension    GERD    History of pneumonia    Incarcerated ventral hernia    Osteoarthritis    Type 2 diabetes mellitus (HCC)    Past Surgical History:  Procedure Laterality Date   CATARACT EXTRACTION Bilateral 2023   CORONARY ARTERY BYPASS GRAFT N/A 05/22/2020   Procedure: CORONARY ARTERY BYPASS GRAFTING (CABG) x3 LIMA TO LAD, SVG TO OM1, SVG TO DISTAL RIGHT;  Surgeon: Corliss Skains, MD;  Location: [facility] OR;  Service: Open Heart Surgery;  Laterality: N/A;   LEFT HEART CATH AND CORONARY ANGIOGRAPHY N/A 05/21/2020   Procedure: LEFT HEART CATH AND CORONARY ANGIOGRAPHY;  Surgeon: [place], Peter M, MD;  Location: [facility] INVASIVE CV LAB;  Service: Cardiovascular;  Laterality: N/A;   NEPHRECTOMY     TEE WITHOUT CARDIOVERSION N/A 05/22/2020   Procedure: TRANSESOPHAGEAL ECHOCARDIOGRAM (TEE);  Surgeon:  Corliss Skains, MD;  Location: [facility] OR;  Service: Open Heart Surgery;  Laterality: N/A;   VEIN HARVEST Right 05/22/2020   Procedure: Open Vein Harvest;  Surgeon: Corliss Skains, MD;  Location: [facility] OR;  Service: Open Heart Surgery;  Laterality: Right;   VENTRAL HERNIA REPAIR  09/01/2017   mistaken entry   Social History   Tobacco Use   Smoking status: Never    Passive exposure: Never   Smokeless tobacco: Never  Vaping Use   Vaping status: Never Used  Substance Use Topics   Alcohol use: No   Drug use: No   Family History  Problem Relation Age of Onset   CAD Father    Heart failure Sister    CAD Daughter    Heart disease Daughter    Allergies  Allergen Reactions   Iodinated Contrast Media Other (See Comments)    Unknown reaction. Due to kidney issues. Do not use Unknown reaction. Due to kidney issues. Do not use   Codeine Rash   Morphine And Codeine Other (See Comments) and Rash    Unknown Reaction Unknown Reaction   Other     Pt has multiple allergies per pt but unsure of all.    Penicillins Other (See Comments)    Unknown reaction Unknown reaction   Review of Systems  Constitutional:  Negative for chills and fever.  HENT:  Negative for sore throat.  Respiratory:  Negative for cough and shortness of breath.   Cardiovascular:  Negative for chest pain, palpitations and leg swelling.  Gastrointestinal:  Negative for abdominal pain, blood in stool, constipation, diarrhea, nausea and vomiting.  Genitourinary:  Negative for dysuria and hematuria.  Musculoskeletal:  Negative for myalgias.  Skin:  Negative for itching and rash.  Neurological:  Negative for dizziness and headaches.  Psychiatric/Behavioral:  Negative for depression and suicidal ideas.      Objective:     BP (!) 155/73 (BP Location: Right Arm, Patient Position: Sitting, Cuff Size: Normal)   Pulse 77   Ht 4\' 11"  (1.499 m)   Wt 148 lb 12.8 oz (67.5 kg)   SpO2 96%   BMI 30.05 kg/m  BP Readings  from Last 3 Encounters:  03/04/24 (!) 155/73  02/16/24 (!) 122/58  02/05/24 124/62   Physical Exam Vitals reviewed.  Constitutional:      General: She is not in acute distress.    Appearance: Normal appearance. She is obese. She is not toxic-appearing.  HENT:     Head: Normocephalic and atraumatic.     Right Ear: External ear normal.     Left Ear: External ear normal.     Nose: Nose normal. No congestion or rhinorrhea.     Mouth/Throat:     Mouth: Mucous membranes are moist.     Pharynx: Oropharynx is clear. No oropharyngeal exudate or posterior oropharyngeal erythema.  Eyes:     General: No scleral icterus.    Extraocular Movements: Extraocular movements intact.     Conjunctiva/sclera: Conjunctivae normal.     Pupils: Pupils are equal, round, and reactive to light.  Cardiovascular:     Rate and Rhythm: Normal rate and regular rhythm.     Pulses: Normal pulses.     Heart sounds: Normal heart sounds. No murmur heard.    No friction rub. No gallop.  Pulmonary:     Effort: Pulmonary effort is normal.     Breath sounds: Normal breath sounds. No wheezing, rhonchi or rales.  Abdominal:     General: Abdomen is flat. Bowel sounds are normal. There is no distension.     Palpations: Abdomen is soft.     Tenderness: There is no abdominal tenderness.  Musculoskeletal:        General: No swelling. Normal range of motion.     Cervical back: Normal range of motion.     Right lower leg: No edema.     Left lower leg: No edema.  Lymphadenopathy:     Cervical: No cervical adenopathy.  Skin:    General: Skin is warm and dry.     Capillary Refill: Capillary refill takes less than 2 seconds.     Coloration: Skin is not jaundiced.  Neurological:     General: No focal deficit present.     Mental Status: She is alert and oriented to person, place, and time.  Psychiatric:        Mood and Affect: Mood normal.        Behavior: Behavior normal.   Last CBC Lab Results  Component Value Date    WBC 8.0 09/01/2023   HGB 11.6 (L) 09/01/2023   HCT 33.8 (L) 09/01/2023   MCV 93.6 09/01/2023   MCH 32.1 09/01/2023   RDW 12.2 09/01/2023   PLT 190 09/01/2023   Last metabolic panel Lab Results  Component Value Date   GLUCOSE 145 (H) 09/01/2023   NA 137 09/01/2023   K 4.5 09/01/2023  CL 105 09/01/2023   CO2 25 09/01/2023   BUN 28 (H) 09/01/2023   CREATININE 1.20 (H) 09/01/2023   GFRNONAA 45 (L) 09/01/2023   CALCIUM 8.6 (L) 09/01/2023   PHOS 3.7 06/23/2023   PROT 7.0 09/01/2023   ALBUMIN 3.5 09/01/2023   LABGLOB 2.3 11/21/2021   AGRATIO 2.0 11/21/2021   BILITOT 0.5 09/01/2023   ALKPHOS 46 09/01/2023   AST 20 09/01/2023   ALT 21 09/01/2023   ANIONGAP 7 09/01/2023   Last lipids Lab Results  Component Value Date   CHOL 100 04/15/2023   HDL 37 (L) 04/15/2023   LDLCALC 43 04/15/2023   TRIG 102 04/15/2023   CHOLHDL 2.7 04/15/2023   Last hemoglobin A1c Lab Results  Component Value Date   HGBA1C 7.8 (A) 02/16/2024   Last thyroid functions Lab Results  Component Value Date   TSH 2.469 05/21/2022   Last vitamin D Lab Results  Component Value Date   VD25OH 51.37 06/23/2023     Assessment & Plan:   Problem List Items Addressed This Visit       Essential hypertension - Primary   BP is elevated today.  We have previously discussed starting losartan in the setting of HTN, CKD, and DM but she has been hesitant to start medication.  Losartan was recently recommended by her nephrologist and she is in agreement with starting treatment. -Start losartan 25 mg daily      Chronic kidney disease, stage 3b (HCC)   Followed by nephrology (Dr. Wolfgang Phoenix).  Recently seen for follow-up.  UPC elevated.  As otherwise documented, losartan 25 mg daily has been prescribed today.  Nephrology follow-up is scheduled for July.      Return in about 3 months (around 06/04/2024).   Billie Lade, MD

## 2024-03-04 NOTE — Assessment & Plan Note (Addendum)
 Followed by nephrology (Dr. Wolfgang Phoenix).  Recently seen for follow-up.  UPC elevated.  As otherwise documented, losartan 25 mg daily has been prescribed today.  Nephrology follow-up is scheduled for July.

## 2024-03-04 NOTE — Assessment & Plan Note (Signed)
 BP is elevated today.  We have previously discussed starting losartan in the setting of HTN, CKD, and DM but she has been hesitant to start medication.  Losartan was recently recommended by her nephrologist and she is in agreement with starting treatment. -Start losartan 25 mg daily

## 2024-03-04 NOTE — Patient Instructions (Signed)
 It was a pleasure to see you today.  Thank you for giving [place] the opportunity to be involved in your care.  Below is a brief recap of your visit and next steps.  We will plan to see you again in 3 months.  Summary Start losartan 25 mg daily Follow up in 3 months

## 2024-03-08 NOTE — Telephone Encounter (Signed)
 Copied from CRM [id number]. Topic: Appointments - Appointment Info/Confirmation >> Mar 07, 2024  4:11 PM Albin Felling L wrote: Patient/patient representative is calling for information regarding an appointment.   Requesting clarification on upcoming appointments for 03/14/2024 for Mammagram and DEXA scan. Possible confusion with Daughter's appointments see CRM: [phone]  Requesting to speak to Abby,   Best callback number: [phone]

## 2024-03-09 ENCOUNTER — Telehealth: Payer: Self-pay | Admitting: Internal Medicine

## 2024-03-09 NOTE — Telephone Encounter (Signed)
 Copied from CRM [id number]. Topic: General - Other >> Mar 09, 2024 11:22 AM Marlow Baars wrote: Reason for CRM: The patient called in stating she is sorry she doesn't get to see Dr Durwin Nora one last time. She would like for him to know thank you, good luck and that she will be praying for him. The patient states she may try and slip in to say goodbye even though she doesn't have an appt. I did confirm her appt in June with her new provider.

## 2024-03-14 ENCOUNTER — Other Ambulatory Visit (HOSPITAL_COMMUNITY)

## 2024-03-14 ENCOUNTER — Ambulatory Visit (HOSPITAL_COMMUNITY)

## 2024-03-28 ENCOUNTER — Other Ambulatory Visit: Payer: Self-pay | Admitting: Internal Medicine

## 2024-03-28 ENCOUNTER — Ambulatory Visit (HOSPITAL_COMMUNITY)
Admission: RE | Admit: 2024-03-28 | Discharge: 2024-03-28 | Disposition: A | Discharge status: Home / Self Care | Source: Ambulatory Visit | Attending: Internal Medicine | Admitting: Internal Medicine

## 2024-03-28 ENCOUNTER — Ambulatory Visit (HOSPITAL_COMMUNITY)
Admission: RE | Admit: 2024-03-28 | Discharge: 2024-03-28 | Disposition: A | Discharge status: Home / Self Care | Source: Ambulatory Visit | Attending: Internal Medicine

## 2024-03-28 DIAGNOSIS — Z0000 Encounter for general adult medical examination without abnormal findings: Secondary | ICD-10-CM | POA: Insufficient documentation

## 2024-03-28 DIAGNOSIS — Z1231 Encounter for screening mammogram for malignant neoplasm of breast: Secondary | ICD-10-CM | POA: Diagnosis not present

## 2024-03-28 DIAGNOSIS — M8589 Other specified disorders of bone density and structure, multiple sites: Secondary | ICD-10-CM | POA: Diagnosis not present

## 2024-03-28 DIAGNOSIS — Z780 Asymptomatic menopausal state: Secondary | ICD-10-CM | POA: Diagnosis not present

## 2024-03-29 DIAGNOSIS — E1140 Type 2 diabetes mellitus with diabetic neuropathy, unspecified: Secondary | ICD-10-CM | POA: Diagnosis not present

## 2024-03-29 DIAGNOSIS — E1151 Type 2 diabetes mellitus with diabetic peripheral angiopathy without gangrene: Secondary | ICD-10-CM | POA: Diagnosis not present

## 2024-03-30 ENCOUNTER — Ambulatory Visit: Payer: Self-pay

## 2024-03-30 NOTE — Telephone Encounter (Signed)
 1st attempt, LVM requesting return call   Copied from CRM [id number]. Topic: Clinical - Lab/Test Results >> Mar 30, 2024  1:09 PM [name] [name] wrote: Reason for CRM: Patient returned call regarding Imaging results for bone density and MAMMO screening, asking for someone to call her back

## 2024-03-30 NOTE — Telephone Encounter (Signed)
 1st attempt, LVM requesting return call  03/30/2024 2:10- 2nd attempt, no answer, LVM with CB # [phone]   Copied from CRM [id number]. Topic: Clinical - Lab/Test Results >> Mar 30, 2024  1:09 PM [name] [name] wrote: Reason for CRM: Patient returned call regarding Imaging results for bone density and MAMMO screening, asking for someone to call her back

## 2024-03-30 NOTE — Telephone Encounter (Signed)
 3rd attempt-LVM, routing

## 2024-04-05 ENCOUNTER — Other Ambulatory Visit: Payer: Self-pay | Admitting: Nurse Practitioner

## 2024-05-03 ENCOUNTER — Other Ambulatory Visit: Payer: Self-pay | Admitting: Internal Medicine

## 2024-05-04 ENCOUNTER — Other Ambulatory Visit: Payer: Self-pay | Admitting: Nurse Practitioner

## 2024-05-11 ENCOUNTER — Other Ambulatory Visit: Payer: Self-pay | Admitting: Cardiology

## 2024-05-17 ENCOUNTER — Ambulatory Visit: Payer: Self-pay | Admitting: Internal Medicine

## 2024-06-02 ENCOUNTER — Other Ambulatory Visit: Payer: Self-pay | Admitting: Nurse Practitioner

## 2024-06-03 ENCOUNTER — Ambulatory Visit

## 2024-06-06 ENCOUNTER — Other Ambulatory Visit: Payer: Self-pay | Admitting: Internal Medicine

## 2024-06-07 DIAGNOSIS — E1151 Type 2 diabetes mellitus with diabetic peripheral angiopathy without gangrene: Secondary | ICD-10-CM | POA: Diagnosis not present

## 2024-06-07 DIAGNOSIS — E1140 Type 2 diabetes mellitus with diabetic neuropathy, unspecified: Secondary | ICD-10-CM | POA: Diagnosis not present

## 2024-06-10 DIAGNOSIS — N189 Chronic kidney disease, unspecified: Secondary | ICD-10-CM | POA: Diagnosis not present

## 2024-06-10 DIAGNOSIS — E211 Secondary hyperparathyroidism, not elsewhere classified: Secondary | ICD-10-CM | POA: Diagnosis not present

## 2024-06-10 DIAGNOSIS — D631 Anemia in chronic kidney disease: Secondary | ICD-10-CM | POA: Diagnosis not present

## 2024-06-10 DIAGNOSIS — R809 Proteinuria, unspecified: Secondary | ICD-10-CM | POA: Diagnosis not present

## 2024-06-20 ENCOUNTER — Ambulatory Visit (INDEPENDENT_AMBULATORY_CARE_PROVIDER_SITE_OTHER): Admitting: Nurse Practitioner

## 2024-06-20 ENCOUNTER — Encounter: Payer: Self-pay | Admitting: Nurse Practitioner

## 2024-06-20 VITALS — BP 130/66 | HR 65 | Ht 59.0 in | Wt 143.6 lb

## 2024-06-20 DIAGNOSIS — E559 Vitamin D deficiency, unspecified: Secondary | ICD-10-CM | POA: Diagnosis not present

## 2024-06-20 DIAGNOSIS — I10 Essential (primary) hypertension: Secondary | ICD-10-CM | POA: Diagnosis not present

## 2024-06-20 DIAGNOSIS — E782 Mixed hyperlipidemia: Secondary | ICD-10-CM | POA: Diagnosis not present

## 2024-06-20 DIAGNOSIS — E1121 Type 2 diabetes mellitus with diabetic nephropathy: Secondary | ICD-10-CM

## 2024-06-20 DIAGNOSIS — Z905 Acquired absence of kidney: Secondary | ICD-10-CM

## 2024-06-20 DIAGNOSIS — Z7984 Long term (current) use of oral hypoglycemic drugs: Secondary | ICD-10-CM | POA: Diagnosis not present

## 2024-06-20 LAB — POCT GLYCOSYLATED HEMOGLOBIN (HGB A1C): Hemoglobin A1C: 7.4 % — AB (ref 4.0–5.6)

## 2024-06-20 MED ORDER — LINAGLIPTIN 5 MG PO TABS: 5.0000 mg | ORAL_TABLET | Freq: Every day | ORAL | 1 refills | Status: DC

## 2024-06-20 MED ORDER — METFORMIN HCL 500 MG PO TABS: 500.0000 mg | ORAL_TABLET | Freq: Every day | ORAL | 1 refills | Status: DC

## 2024-06-20 NOTE — Progress Notes (Signed)
 Endocrinology Follow Up Note       06/20/2024, 2:00 PM   Subjective:    Patient ID: [name] [name], female    DOB: 18-Feb-1941.  [name] [name] is being seen in follow up after being seen in consultation for management of currently uncontrolled symptomatic diabetes requested by  Bevely Doffing, FNP.   Past Medical History:  Diagnosis Date   Anxiety    CAD (coronary artery disease)    Multivessel status post CABG June 2021 - LIMA to LAD, SVG to distal RCA, SVG to OM1   Chronic obstructive pulmonary disease, unspecified (HCC)    Depression    Essential hypertension    GERD    History of pneumonia    Incarcerated ventral hernia    Osteoarthritis    Type 2 diabetes mellitus (HCC)     Past Surgical History:  Procedure Laterality Date   CATARACT EXTRACTION Bilateral 2023   CORONARY ARTERY BYPASS GRAFT N/A 05/22/2020   Procedure: CORONARY ARTERY BYPASS GRAFTING (CABG) x3 LIMA TO LAD, SVG TO OM1, SVG TO DISTAL RIGHT;  Surgeon: Shyrl Linnie KIDD, MD;  Location: [facility] OR;  Service: Open Heart Surgery;  Laterality: N/A;   LEFT HEART CATH AND CORONARY ANGIOGRAPHY N/A 05/21/2020   Procedure: LEFT HEART CATH AND CORONARY ANGIOGRAPHY;  Surgeon: Swazil[place], Peter M, MD;  Location: [facility] INVASIVE CV LAB;  Service: Cardiovascular;  Laterality: N/A;   NEPHRECTOMY     TEE WITHOUT CARDIOVERSION N/A 05/22/2020   Procedure: TRANSESOPHAGEAL ECHOCARDIOGRAM (TEE);  Surgeon: Shyrl Linnie KIDD, MD;  Location: [facility] OR;  Service: Open Heart Surgery;  Laterality: N/A;   VEIN HARVEST Right 05/22/2020   Procedure: Open Vein Harvest;  Surgeon: Shyrl Linnie KIDD, MD;  Location: [facility] OR;  Service: Open Heart Surgery;  Laterality: Right;   VENTRAL HERNIA REPAIR  09/01/2017   mistaken entry    Social History   Socioeconomic History   Marital status: Widowed    Spouse name: Not on file   Number of children: 1   Years of education: 27    Highest education level: 12th grade  Occupational History   Occupation: Retired  Tobacco Use   Smoking status: Never    Passive exposure: Never   Smokeless tobacco: Never  Vaping Use   Vaping status: Never Used  Substance and Sexual Activity   Alcohol use: No   Drug use: No   Sexual activity: Not Currently  Other Topics Concern   Not on file  Social History Narrative   Lives alone.  Has 1 daughter      Enjoys reading and cooking      Eats all food groups   Caffeine, some coffee   Water reports drinking pretty well throughout the day      Social Drivers of Health   Financial Resource Strain: Low Risk  (01/26/2024)   Overall Financial Resource Strain (CARDIA)    Difficulty of Paying Living Expenses: Not hard at all  Food Insecurity: No Food Insecurity (01/26/2024)   Hunger Vital Sign    Worried About Running Out of Food in the Last Year: Never true    Ran Out of Food in the Last Year: Never true  Transportation Needs: No Transportation Needs (01/26/2024)   PRAPARE - Administrator, Civil Service (Medical): No    Lack of Transportation (Non-Medical): No  Physical Activity: Insufficiently Active (01/26/2024)   Exercise Vital Sign    Days of Exercise per Week: 3 days    Minutes of Exercise per Session: 20 min  Stress: No Stress Concern Present (01/26/2024)   [employer] of Occupational Health - Occupational Stress Questionnaire    Feeling of Stress : Not at all  Social Connections: Moderately Isolated (01/26/2024)   Social Connection and Isolation Panel    Frequency of Communication with Friends and Family: More than three times a week    Frequency of Social Gatherings with Friends and Family: More than three times a week    Attends Religious Services: More than 4 times per year    Active Member of [employer] or Organizations: No    Attends Banker Meetings: Never    Marital Status: Widowed    Family History  Problem Relation Age of Onset   CAD  Father    Heart failure Sister    CAD Daughter    Heart disease Daughter     Outpatient Encounter Medications as of 06/20/2024  Medication Sig   Accu-Chek Softclix Lancets lancets Use as instructed   acetaminophen  (TYLENOL ) 325 MG tablet Take 650 mg by mouth every 6 (six) hours as needed for moderate pain.   aspirin  EC 81 MG tablet Take 1 tablet (81 mg total) by mouth daily. Swallow whole.   atorvastatin  (LIPITOR) 40 MG tablet take 1 tablet (40 MILLIGRAM total) by mouth at bedtime.   blood glucose meter kit and supplies Dispense based on patient and insurance preference. Use up to four times daily as directed. (FOR ICD-10 E10.9, E11.9).   Blood Pressure Monitor DEVI 1 each by Does not apply route daily.   cetirizine  (ZYRTEC ) 10 MG tablet Take 1 tablet (10 mg total) by mouth daily as needed (allergies).   famotidine  (PEPCID ) 20 MG tablet take 1 tablet (20 milligram total) by mouth daily.   fish oil-omega-3 fatty acids  1000 MG capsule Take 1 g by mouth daily.   glucose blood (ACCU-CHEK GUIDE) test strip Use as instructed to monitor glucose twice daily   glucose blood test strip Use as instructed to monitor glucose twice daily   guaiFENesin -dextromethorphan (ROBITUSSIN DM) 100-10 MG/5ML syrup Take 5 mLs by mouth every 4 (four) hours as needed for cough.   losartan  (COZAAR ) 25 MG tablet Take 1 tablet (25 mg total) by mouth daily.   Multiple Vitamin (MULITIVITAMIN WITH MINERALS) TABS Take 1 tablet by mouth daily.   Multiple Vitamins-Minerals (AIRBORNE GUMMIES PO) Take by mouth daily.   nitroGLYCERIN  (NITROSTAT ) 0.4 MG SL tablet place 1 tablet (0.4 MILLIGRAM total) under the tongue every 5 (five) minutes TIMES 3 doses as needed for chest pain (if no relief after 3rd dose, proceed to the ed or call 911).   polyethylene glycol (MIRALAX  / GLYCOLAX ) 17 g packet Take 17 g by mouth daily as needed for mild constipation or moderate constipation.   [DISCONTINUED] metFORMIN  (GLUCOPHAGE ) 500 MG tablet Take 1  tablet (500 mg total) by mouth daily with breakfast.   [DISCONTINUED] TRADJENTA  5 MG TABS tablet Take 1 tablet by mouth once daily   linagliptin  (TRADJENTA ) 5 MG TABS tablet Take 1 tablet (5 mg total) by mouth daily.   metFORMIN  (GLUCOPHAGE ) 500 MG tablet Take 1 tablet (500 mg total) by mouth daily with breakfast.  No facility-administered encounter medications on file as of 06/20/2024.    ALLERGIES: Allergies  Allergen Reactions   Iodinated Contrast Media Other (See Comments)    Unknown reaction. Due to kidney issues. Do not use Unknown reaction. Due to kidney issues. Do not use   Codeine Rash   Morphine  And Codeine Other (See Comments) and Rash    Unknown Reaction Unknown Reaction   Other     Pt has multiple allergies per pt but unsure of all.    Penicillins Other (See Comments)    Unknown reaction Unknown reaction    VACCINATION STATUS: Immunization History  Administered Date(s) Administered   Influenza, High Dose Seasonal PF 11/12/2017, 08/13/2018   Influenza-Unspecified 11/12/2011, 11/03/2012, 11/02/2013, 09/20/2014, 09/19/2015, 09/23/2016, 08/18/2019   Pneumococcal Conjugate-13 09/20/2014   Pneumococcal Polysaccharide-23 03/18/2016    Diabetes She presents for her follow-up diabetic visit. She has type 2 diabetes mellitus. Onset time: Was diagnosed at approx age of 32. Her disease course has been improving. There are no hypoglycemic associated symptoms. Associated symptoms include blurred vision and foot paresthesias. Pertinent negatives for diabetes include no fatigue, no polydipsia and no polyuria. There are no hypoglycemic complications. Symptoms are stable. Diabetic complications include heart disease, nephropathy and peripheral neuropathy. Risk factors for coronary artery disease include diabetes mellitus, dyslipidemia, hypertension, stress, tobacco exposure, sedentary lifestyle and post-menopausal. Current diabetic treatment includes oral agent (dual therapy). She is  compliant with treatment most of the time. Her weight is fluctuating minimally. She is following a generally healthy diet. When asked about meal planning, she reported none. She has not had a previous visit with a dietitian. She rarely participates in exercise. Her home blood glucose trend is fluctuating minimally. Her breakfast blood glucose range is generally 130-140 mg/dl. (She presents today with her meter and logs showing mostly at goal fasting glycemic profile.  Her POCT A1c today is 7.4%, improving from last visit of 7.8%.   She notes she cheats on her diet every once in a while but has plans to exercise more now that the weather is getting warmer.  She notes she has been more stressed in the last several months, even had to take a heart pill today due to tightness in her chest.) An ACE inhibitor/angiotensin II receptor blocker is not being taken. She does not see a podiatrist.Eye exam is current.  Hyperlipidemia This is a chronic problem. The current episode started more than 1 year ago. The problem is uncontrolled. Recent lipid tests were reviewed and are variable. Exacerbating diseases include chronic renal disease and diabetes. Factors aggravating her hyperlipidemia include fatty foods and beta blockers. Current antihyperlipidemic treatment includes statins. The current treatment provides mild improvement of lipids. Compliance problems include adherence to diet and adherence to exercise.  Risk factors for coronary artery disease include diabetes mellitus, dyslipidemia, hypertension, post-menopausal, a sedentary lifestyle and stress.  Hypertension This is a chronic problem. The current episode started more than 1 year ago. The problem has been waxing and waning since onset. The problem is controlled. Associated symptoms include blurred vision. There are no associated agents to hypertension. Risk factors for coronary artery disease include diabetes mellitus, dyslipidemia, sedentary lifestyle,  smoking/tobacco exposure, stress and post-menopausal state. Past treatments include beta blockers. The current treatment provides mild improvement. There are no compliance problems.  Hypertensive end-organ damage includes kidney disease and CAD/MI. Identifiable causes of hypertension include chronic renal disease.    Review of systems  Constitutional: + decreasing body weight,  current Body mass index is  29 kg/m. , no fatigue, no subjective hyperthermia, no subjective hypothermia Eyes: no blurry vision, no xerophthalmia ENT: no sore throat, no nodules palpated in throat, no dysphagia/odynophagia, no hoarseness Cardiovascular: no chest pain, no shortness of breath, no palpitations, no leg swelling Respiratory: no cough, no shortness of breath Gastrointestinal: no nausea/vomiting/diarrhea, large hernia located to left side-tender to touch Musculoskeletal: no muscle/joint aches Skin: no rashes, no hyperemia Neurological: no tremors, no numbness, no tingling, no dizziness Psychiatric: no depression, no anxiety  Objective:     BP 130/66 (BP Location: Left Arm, Patient Position: Sitting, Cuff Size: Large)   Pulse 65   Ht 4' 11 (1.499 m)   Wt 143 lb 9.6 oz (65.1 kg)   BMI 29.00 kg/m   Wt Readings from Last 3 Encounters:  06/20/24 143 lb 9.6 oz (65.1 kg)  03/04/24 148 lb 12.8 oz (67.5 kg)  02/16/24 146 lb (66.2 kg)     BP Readings from Last 3 Encounters:  06/20/24 130/66  03/04/24 (!) 155/73  02/16/24 (!) 122/58      Physical Exam- Limited  Constitutional:  Body mass index is 29 kg/m. , not in acute distress, anxious state of mind Eyes:  EOMI, no exophthalmos Musculoskeletal: she does have abnormal bulge in LLQ of abdomen stretching around her back (inoperable hernia), strength intact in all four extremities, no gross restriction of joint movements Skin:  no rashes, no hyperemia Neurological: no tremor with outstretched hands   CMP ( most recent) CMP     Component Value  Date/Time   NA 137 09/01/2023 1801   NA 141 11/21/2021 1115   K 4.5 09/01/2023 1801   CL 105 09/01/2023 1801   CO2 25 09/01/2023 1801   GLUCOSE 145 (H) 09/01/2023 1801   BUN 28 (H) 09/01/2023 1801   BUN 27 11/21/2021 1115   CREATININE 1.20 (H) 09/01/2023 1801   CREATININE 1.28 (H) 03/07/2020 1137   CALCIUM  8.6 (L) 09/01/2023 1801   CALCIUM  9.2 06/23/2023 1403   PROT 7.0 09/01/2023 1801   PROT 6.8 11/21/2021 1115   ALBUMIN  3.5 09/01/2023 1801   ALBUMIN  4.5 11/21/2021 1115   AST 20 09/01/2023 1801   ALT 21 09/01/2023 1801   ALKPHOS 46 09/01/2023 1801   BILITOT 0.5 09/01/2023 1801   BILITOT 0.3 11/21/2021 1115   GFRNONAA 45 (L) 09/01/2023 1801   GFRNONAA 40 (L) 03/07/2020 1137   GFRAA 48 (L) 12/28/2020 0955   GFRAA 46 (L) 03/07/2020 1137     Diabetic Labs (most recent): Lab Results  Component Value Date   HGBA1C 7.4 (A) 06/20/2024   HGBA1C 7.8 (A) 02/16/2024   HGBA1C 7.2 (A) 10/12/2023   MICROALBUR 30 02/27/2021   MICROALBUR 2.4 03/07/2020   MICROALBUR 0.9 08/13/2018     Lipid Panel ( most recent) Lipid Panel     Component Value Date/Time   CHOL 100 04/15/2023 1414   CHOL 121 11/21/2021 1115   TRIG 102 04/15/2023 1414   HDL 37 (L) 04/15/2023 1414   HDL 39 (L) 11/21/2021 1115   CHOLHDL 2.7 04/15/2023 1414   VLDL 20 04/15/2023 1414   LDLCALC 43 04/15/2023 1414   LDLCALC 55 11/21/2021 1115   LDLCALC 109 (H) 03/07/2020 1137   LABVLDL 27 11/21/2021 1115      Lab Results  Component Value Date   TSH 2.469 05/21/2022   TSH 3.040 08/22/2021   TSH 2.02 11/17/2019   FREET4 1.25 08/22/2021           Assessment &  Plan:   1) Controlled Type 2 Diabetes with CKD stage 3  - [name] [name] has currently uncontrolled symptomatic type 2 DM since 83 years of age.  She presents today with her meter and logs showing mostly at goal fasting glycemic profile.  Her POCT A1c today is 7.4%, improving from last visit of 7.8%.   She notes she cheats on her diet every once in  a while but has plans to exercise more now that the weather is getting warmer.  She notes she has been more stressed in the last several months, even had to take a heart pill today due to tightness in her chest.  -Recent labs reviewed.  - I had a long discussion with her about the progressive nature of diabetes and the pathology behind its complications. -her diabetes is complicated by CAD and she remains at a high risk for more acute and chronic complications which include CAD, CVA, CKD, retinopathy, and neuropathy. These are all discussed in detail with her.  - Nutritional counseling repeated at each appointment due to patients tendency to fall back in to old habits.  - The patient admits there is a room for improvement in their diet and drink choices. -  Suggestion is made for the patient to avoid simple carbohydrates from their diet including Cakes, Sweet Desserts / Pastries, Ice Cream, Soda (diet and regular), Sweet Tea, Candies, Chips, Cookies, Sweet Pastries, Store Bought Juices, Alcohol in Excess of 1-2 drinks a day, Artificial Sweeteners, Coffee Creamer, and Sugar-free Products. This will help patient to have stable blood glucose profile and potentially avoid unintended weight gain.   - I encouraged the patient to switch to unprocessed or minimally processed complex starch and increased protein intake (animal or plant source), fruits, and vegetables.   - Patient is advised to stick to a routine mealtimes to eat 3 meals a day and avoid unnecessary snacks (to snack only to correct hypoglycemia).  - she will be scheduled with Penny Crumpton, RDN, CDE for diabetes education.  - I have approached her with the following individualized plan to manage  her diabetes and patient agrees:   Given her stable, at goal glycemic profile overall, she can continue her current medication regimen of Metformin  500 mg po once daily with breakfast, Tradjenta  5 mg po daily with breakfast.   Goal A1c for her  would be around 7%.  -she is encouraged to continue monitoring blood glucose at least once daily, before breakfast, and to call the clinic if she has readings less than 70 or above 200 for 3 tests in a row.  - Specific targets for  A1c;  LDL, HDL,  and Triglycerides were discussed with the patient.  2) Blood Pressure /Hypertension:  her blood pressure is controlled to target for her age.   she is not currently on any antihypertensive medications at this time.  3) Lipids/Hyperlipidemia:    Review of her recent lipid panel from 04/15/23 showed controlled  LDL at 43.  she  is advised to continue Lipitor 40 mg daily at bedtime and Fish Oil supplement.  Side effects and precautions discussed with her.    4)  Weight/Diet:  her Body mass index is 29 kg/m.  -  clearly complicating her diabetes care.   she is a candidate for weight loss. I discussed with her the fact that loss of 5 - 10% of her  current body weight will have the most impact on her diabetes management.  Exercise, and detailed carbohydrates  information provided  -  detailed on discharge instructions.  5) Hypervitaminosis of vitamin D  Her most recent vitamin D  level on 08/22/21 was high at 113.  She was taking several supplements that contained vitamin D  in it.  The nephrologist asked her to stop all vitamin D  supplements.  6) Chronic Care/Health Maintenance: -she is on Statin medications and is encouraged to initiate and continue to follow up with Ophthalmology, Dentist, Podiatrist at least yearly or according to recommendations, and advised to stay away from smoking. I have recommended yearly flu vaccine and pneumonia vaccine at least every 5 years; moderate intensity exercise for up to 150 minutes weekly; and sleep for at least 7 hours a day.  - she is advised to maintain close follow up with Bevely Doffing, FNP for primary care needs, as well as her other providers for optimal and coordinated care.     I spent  30  minutes in the care  of the patient today including review of labs from CMP, Lipids, Thyroid  Function, Hematology (current and previous including abstractions from other facilities); face-to-face time discussing  her blood glucose readings/logs, discussing hypoglycemia and hyperglycemia episodes and symptoms, medications doses, her options of short and long term treatment based on the latest standards of care / guidelines;  discussion about incorporating lifestyle medicine;  and documenting the encounter. Risk reduction counseling performed per USPSTF guidelines to reduce obesity and cardiovascular risk factors.     Please refer to Patient Instructions for Blood Glucose Monitoring and Insulin /Medications Dosing Guide  in media tab for additional information. Please  also refer to  Patient Self Inventory in the Media  tab for reviewed elements of pertinent patient history.  [name] [name] participated in the discussions, expressed understanding, and voiced agreement with the above plans.  All questions were answered to her satisfaction. she is encouraged to contact clinic should she have any questions or concerns prior to her return visit.   Follow up plan: - Return in about 4 months (around 10/21/2024) for Diabetes F/U with A1c in office, No previsit labs.  Benton Rio, [facility] [address] [place], [place] [address] Phone: [phone] Fax: [phone]  06/20/2024, 2:00 PM

## 2024-06-24 DIAGNOSIS — N1832 Chronic kidney disease, stage 3b: Secondary | ICD-10-CM | POA: Diagnosis not present

## 2024-06-24 DIAGNOSIS — E1129 Type 2 diabetes mellitus with other diabetic kidney complication: Secondary | ICD-10-CM | POA: Diagnosis not present

## 2024-06-24 DIAGNOSIS — I129 Hypertensive chronic kidney disease with stage 1 through stage 4 chronic kidney disease, or unspecified chronic kidney disease: Secondary | ICD-10-CM | POA: Diagnosis not present

## 2024-06-24 DIAGNOSIS — R809 Proteinuria, unspecified: Secondary | ICD-10-CM | POA: Diagnosis not present

## 2024-06-30 ENCOUNTER — Other Ambulatory Visit: Payer: Self-pay

## 2024-07-02 ENCOUNTER — Other Ambulatory Visit: Payer: Self-pay | Admitting: Nurse Practitioner

## 2024-07-04 ENCOUNTER — Ambulatory Visit (INDEPENDENT_AMBULATORY_CARE_PROVIDER_SITE_OTHER)

## 2024-07-04 VITALS — BP 122/64 | HR 71 | Resp 16 | Ht 59.0 in | Wt 143.0 lb

## 2024-07-04 DIAGNOSIS — R1900 Intra-abdominal and pelvic swelling, mass and lump, unspecified site: Secondary | ICD-10-CM

## 2024-07-04 DIAGNOSIS — K436 Other and unspecified ventral hernia with obstruction, without gangrene: Secondary | ICD-10-CM

## 2024-07-04 NOTE — Progress Notes (Addendum)
 Established Patient Office Visit  Subjective   Patient ID: [name] [name], female    DOB: [birth year]  Age: 83 y.o. MRN: [id number]  Chief Complaint  Patient presents with   Hypertension    Follow up visit    Bloated    States her abdomen swells a lot     HPI  Patient Active Problem List   Diagnosis Date Noted   OSA (obstructive sleep apnea) 07/31/2023   Essential hypertension 06/08/2023   Coronary artery disease 06/08/2023   Elevated troponin 04/24/2023   Tick bite of lower leg 04/24/2023   Bronchitis 02/18/2023   PAD (peripheral artery disease) (HCC) 08/21/2022   Overweight (BMI 25.0-29.9) 08/21/2022   Right hip pain 08/21/2022   Pain of right thumb 11/27/2021   TIA (transient ischemic attack) 07/01/2021   Chest pain 06/30/2021   Medication management 12/12/2020   Mixed hyperlipidemia 11/27/2020   Left lower quadrant abdominal mass 11/27/2020   Abdominal wall mass of left flank 11/27/2020   Acute pain of left shoulder 10/03/2020   Joint stiffness of hand, left 10/03/2020   Gastro-esophageal reflux disease with esophagitis    Major depressive disorder, single episode, unspecified    Unspecified osteoarthritis, unspecified site    Atherosclerotic heart disease of native coronary artery without angina pectoris    Chronic kidney disease, stage 3b (HCC)    Hypertensive heart disease without heart failure    Type 2 diabetes mellitus with diabetic nephropathy (HCC)    Ventral hernia without obstruction or gangrene 09/01/2017   Incarcerated ventral hernia       ROS    Objective:     BP 122/64   Pulse 71   Resp 16   Ht 4' 11 (1.499 m)   Wt 143 lb (64.9 kg)   SpO2 94%   BMI 28.88 kg/m  BP Readings from Last 3 Encounters:  07/04/24 122/64  06/20/24 130/66  03/04/24 (!) 155/73   Wt Readings from Last 3 Encounters:  07/04/24 143 lb (64.9 kg)  06/20/24 143 lb 9.6 oz (65.1 kg)  03/04/24 148 lb 12.8 oz (67.5 kg)      Physical Exam Vitals and nursing  note reviewed.  Constitutional:      Appearance: She is obese.  HENT:     Head: Normocephalic.  Eyes:     Extraocular Movements: Extraocular movements intact.     Pupils: Pupils are equal, round, and reactive to light.  Cardiovascular:     Rate and Rhythm: Normal rate and regular rhythm.  Pulmonary:     Effort: Pulmonary effort is normal.     Breath sounds: Normal breath sounds.  Abdominal:     General: Abdomen is protuberant. Bowel sounds are decreased. There is distension.     Palpations: There is mass.     Tenderness: There is generalized abdominal tenderness. There is no right CVA tenderness or left CVA tenderness.     Hernia: A hernia is present. Hernia is present in the ventral area.  Musculoskeletal:     Cervical back: Normal range of motion and neck supple.  Neurological:     Mental Status: She is alert and oriented to person, place, and time.  Psychiatric:        Mood and Affect: Mood normal.        Thought Content: Thought content normal.      No results found for any visits on 07/04/24.  Last CBC Lab Results  Component Value Date   WBC 8.0 09/01/2023  HGB 11.6 (L) 09/01/2023   HCT 33.8 (L) 09/01/2023   MCV 93.6 09/01/2023   MCH 32.1 09/01/2023   RDW 12.2 09/01/2023   PLT 190 09/01/2023   Last metabolic panel Lab Results  Component Value Date   GLUCOSE 145 (H) 09/01/2023   NA 137 09/01/2023   K 4.5 09/01/2023   CL 105 09/01/2023   CO2 25 09/01/2023   BUN 28 (H) 09/01/2023   CREATININE 1.20 (H) 09/01/2023   GFRNONAA 45 (L) 09/01/2023   CALCIUM  8.6 (L) 09/01/2023   PHOS 3.7 06/23/2023   PROT 7.0 09/01/2023   ALBUMIN  3.5 09/01/2023   LABGLOB 2.3 11/21/2021   AGRATIO 2.0 11/21/2021   BILITOT 0.5 09/01/2023   ALKPHOS 46 09/01/2023   AST 20 09/01/2023   ALT 21 09/01/2023   ANIONGAP 7 09/01/2023   Last lipids Lab Results  Component Value Date   CHOL 100 04/15/2023   HDL 37 (L) 04/15/2023   LDLCALC 43 04/15/2023   TRIG 102 04/15/2023    CHOLHDL 2.7 04/15/2023   Last hemoglobin A1c Lab Results  Component Value Date   HGBA1C 7.4 (A) 06/20/2024   Last thyroid  functions Lab Results  Component Value Date   TSH 2.469 05/21/2022      The ASCVD Risk score (Arnett DK, et al., 2019) failed to calculate for the following reasons:   The 2019 ASCVD risk score is only valid for ages 48 to 51    Assessment & Plan:   Problem List Items Addressed This Visit       Other   Incarcerated ventral hernia   Chronic in nature.  Unfortunately she is not a good surgical candidate for repair.  Manage symptoms with over-the-counter pain relievers and avoiding constipation.      Abdominal wall mass of left flank - Primary   Chronic in nature.  She continues to endorse difficulty with having regular bowel movements. She continues to take MiraLAX  and Colace. Vital signs are stable no acute findings at this time.       Return in about 4 months (around 11/04/2024) for chronic follow-up with PCP.    Leita Longs, FNP

## 2024-07-07 ENCOUNTER — Other Ambulatory Visit: Payer: Self-pay | Admitting: Nurse Practitioner

## 2024-07-07 NOTE — Telephone Encounter (Signed)
 Pt states this needs to be filled. It was sent to [facility] but she uses [facility], pt is out

## 2024-07-12 NOTE — Assessment & Plan Note (Signed)
 Chronic in nature.  She continues to endorse difficulty with having regular bowel movements. She continues to take MiraLAX  and Colace. Vital signs are stable no acute findings at this time.

## 2024-07-12 NOTE — Assessment & Plan Note (Signed)
 Chronic in nature.  Unfortunately she is not a good surgical candidate for repair.  Manage symptoms with over-the-counter pain relievers and avoiding constipation.

## 2024-07-28 ENCOUNTER — Other Ambulatory Visit: Payer: Self-pay

## 2024-08-18 DIAGNOSIS — E1140 Type 2 diabetes mellitus with diabetic neuropathy, unspecified: Secondary | ICD-10-CM | POA: Diagnosis not present

## 2024-08-18 DIAGNOSIS — E1151 Type 2 diabetes mellitus with diabetic peripheral angiopathy without gangrene: Secondary | ICD-10-CM | POA: Diagnosis not present

## 2024-08-28 ENCOUNTER — Other Ambulatory Visit: Payer: Self-pay | Admitting: Internal Medicine

## 2024-08-28 ENCOUNTER — Other Ambulatory Visit: Payer: Self-pay

## 2024-08-28 DIAGNOSIS — N1832 Chronic kidney disease, stage 3b: Secondary | ICD-10-CM

## 2024-08-28 DIAGNOSIS — I10 Essential (primary) hypertension: Secondary | ICD-10-CM

## 2024-09-26 ENCOUNTER — Other Ambulatory Visit: Payer: Self-pay | Admitting: Nurse Practitioner

## 2024-09-26 ENCOUNTER — Other Ambulatory Visit: Payer: Self-pay

## 2024-09-28 ENCOUNTER — Encounter: Payer: Self-pay | Admitting: Cardiology

## 2024-09-28 ENCOUNTER — Ambulatory Visit: Attending: Cardiology | Admitting: Cardiology

## 2024-09-28 VITALS — BP 132/64 | HR 71 | Ht 59.0 in | Wt 142.8 lb

## 2024-09-28 DIAGNOSIS — E782 Mixed hyperlipidemia: Secondary | ICD-10-CM | POA: Diagnosis not present

## 2024-09-28 DIAGNOSIS — N1832 Chronic kidney disease, stage 3b: Secondary | ICD-10-CM | POA: Insufficient documentation

## 2024-09-28 DIAGNOSIS — I25119 Atherosclerotic heart disease of native coronary artery with unspecified angina pectoris: Secondary | ICD-10-CM | POA: Diagnosis not present

## 2024-09-28 DIAGNOSIS — I441 Atrioventricular block, second degree: Secondary | ICD-10-CM | POA: Insufficient documentation

## 2024-09-28 NOTE — Patient Instructions (Addendum)

## 2024-09-28 NOTE — Progress Notes (Signed)
    Cardiology Office Note  Date: 09/28/2024   ID: XARIAH SILVERNAIL, DOB [birth year], MRN [id number]  History of Present Illness: [name] [name] is an 83 y.o. female last seen in February.  She is here for a follow-up visit.  Reports no exertional chest pain or nitroglycerin  use.  No sudden dizziness or syncope.  Her weight has been stable.  I went over her medications.  No changes noted from a cardiac perspective.  Blood pressure is reasonable today.  I went over her interval lab work as well.  I reviewed her ECG today which shows sinus rhythm with second-degree Mobitz type I AV block and repolarization abnormalities.  Physical Exam: VS:  BP 132/64   Pulse 71   Ht 4' 11 (1.499 m)   Wt 142 lb 12.8 oz (64.8 kg)   SpO2 96%   BMI 28.84 kg/m , BMI Body mass index is 28.84 kg/m.  Wt Readings from Last 3 Encounters:  09/28/24 142 lb 12.8 oz (64.8 kg)  07/04/24 143 lb (64.9 kg)  06/20/24 143 lb 9.6 oz (65.1 kg)    General: Patient appears comfortable at rest. HEENT: Conjunctiva and lids normal. Neck: Supple, no elevated JVP or carotid bruits. Lungs: Clear to auscultation, nonlabored breathing at rest. Cardiac: Regular rate and rhythm, no S3, soft systolic murmur. Extremities: No pitting edema.  ECG:  An ECG dated 09/01/2023 was personally reviewed today and demonstrated:  Sinus rhythm with second-degree type I AV block, nonspecific ST-T changes.  Labwork:    Component Value Date/Time   CHOL 100 04/15/2023 1414   CHOL 121 11/21/2021 1115   TRIG 102 04/15/2023 1414   HDL 37 (L) 04/15/2023 1414   HDL 39 (L) 11/21/2021 1115   CHOLHDL 2.7 04/15/2023 1414   VLDL 20 04/15/2023 1414   LDLCALC 43 04/15/2023 1414   LDLCALC 55 11/21/2021 1115   LDLCALC 109 (H) 03/07/2020 1137  June 2025: BUN 18, creatinine 1.39, GFR 38, potassium 5, hemoglobin 12.3, platelets 216 July 2025: Hemoglobin A1c 7.4%  Other Studies Reviewed Today:  No interval cardiac testing for review  today.  Assessment and Plan:  1.  Multivessel CAD status post CABG in 2021 with LIMA to LAD, SVG to distal RCA, and SVG to OM1.  Echocardiogram in May 2024 revealed LVEF 65 to 70%.  She does not describe any interval angina or nitroglycerin  use.  Continue aspirin  81 mg daily, Lipitor 40 mg daily, and as needed nitroglycerin .   2.  Conduction system disease.  Intermittent bradycardia with episodic second-degree type I heart block and dropped sinus beats with 3.2-second pause by cardiac monitor in 2023.  ECG reviewed today and stable.  She remains asymptomatic, no sudden dizziness or syncope.  She is not on any AV nodal blockers.   3.  Primary hypertension.  Continue Cozaar  25 mg daily.   4.  CKD stage IIIb, creatinine 1.39 with GFR 38 in June.   5.  Mixed hyperlipidemia, LDL 43 in May 2024.  Continue Lipitor 40 mg daily.  Disposition:  Follow up 6 months.  Signed, Jayson JUDITHANN Sierras, M.D., F.A.C.C. [facility]

## 2024-10-14 DIAGNOSIS — N189 Chronic kidney disease, unspecified: Secondary | ICD-10-CM | POA: Diagnosis not present

## 2024-10-14 DIAGNOSIS — I10 Essential (primary) hypertension: Secondary | ICD-10-CM | POA: Diagnosis not present

## 2024-10-14 DIAGNOSIS — E119 Type 2 diabetes mellitus without complications: Secondary | ICD-10-CM | POA: Diagnosis not present

## 2024-10-14 DIAGNOSIS — R809 Proteinuria, unspecified: Secondary | ICD-10-CM | POA: Diagnosis not present

## 2024-10-14 DIAGNOSIS — D631 Anemia in chronic kidney disease: Secondary | ICD-10-CM | POA: Diagnosis not present

## 2024-10-24 ENCOUNTER — Ambulatory Visit: Admitting: Nurse Practitioner

## 2024-10-24 ENCOUNTER — Encounter: Payer: Self-pay | Admitting: Nurse Practitioner

## 2024-10-24 VITALS — BP 124/78 | HR 60 | Ht 59.0 in | Wt 142.4 lb

## 2024-10-24 DIAGNOSIS — E1121 Type 2 diabetes mellitus with diabetic nephropathy: Secondary | ICD-10-CM

## 2024-10-24 DIAGNOSIS — E559 Vitamin D deficiency, unspecified: Secondary | ICD-10-CM | POA: Diagnosis not present

## 2024-10-24 DIAGNOSIS — Z7984 Long term (current) use of oral hypoglycemic drugs: Secondary | ICD-10-CM

## 2024-10-24 DIAGNOSIS — Z905 Acquired absence of kidney: Secondary | ICD-10-CM

## 2024-10-24 DIAGNOSIS — E782 Mixed hyperlipidemia: Secondary | ICD-10-CM

## 2024-10-24 DIAGNOSIS — I10 Essential (primary) hypertension: Secondary | ICD-10-CM

## 2024-10-24 LAB — POCT GLYCOSYLATED HEMOGLOBIN (HGB A1C): Hemoglobin A1C: 7.5 % — AB (ref 4.0–5.6)

## 2024-10-24 MED ORDER — LINAGLIPTIN 5 MG PO TABS: 5.0000 mg | ORAL_TABLET | Freq: Every day | ORAL | 1 refills | Status: AC

## 2024-10-24 MED ORDER — METFORMIN HCL 500 MG PO TABS: 500.0000 mg | ORAL_TABLET | Freq: Every day | ORAL | 1 refills | Status: AC

## 2024-10-24 NOTE — Progress Notes (Signed)
 Endocrinology Follow Up Note       10/24/2024, 1:27 PM   Subjective:    Patient ID: [name] [name], female    DOB: Apr 15, 1941.  [name] [name] is being seen in follow up after being seen in consultation for management of currently uncontrolled symptomatic diabetes requested by  Bevely Doffing, FNP.   Past Medical History:  Diagnosis Date   Anxiety    CAD (coronary artery disease)    Multivessel status post CABG June 2021 - LIMA to LAD, SVG to distal RCA, SVG to OM1   Chronic obstructive pulmonary disease, unspecified (HCC)    Depression    Essential hypertension    GERD    History of pneumonia    Incarcerated ventral hernia    Osteoarthritis    Type 2 diabetes mellitus (HCC)     Past Surgical History:  Procedure Laterality Date   CATARACT EXTRACTION Bilateral 2023   CORONARY ARTERY BYPASS GRAFT N/A 05/22/2020   Procedure: CORONARY ARTERY BYPASS GRAFTING (CABG) x3 LIMA TO LAD, SVG TO OM1, SVG TO DISTAL RIGHT;  Surgeon: Shyrl Linnie KIDD, MD;  Location: [facility] OR;  Service: Open Heart Surgery;  Laterality: N/A;   LEFT HEART CATH AND CORONARY ANGIOGRAPHY N/A 05/21/2020   Procedure: LEFT HEART CATH AND CORONARY ANGIOGRAPHY;  Surgeon: Jordan, Peter M, MD;  Location: [facility] INVASIVE CV LAB;  Service: Cardiovascular;  Laterality: N/A;   NEPHRECTOMY     TEE WITHOUT CARDIOVERSION N/A 05/22/2020   Procedure: TRANSESOPHAGEAL ECHOCARDIOGRAM (TEE);  Surgeon: Shyrl Linnie KIDD, MD;  Location: [facility] OR;  Service: Open Heart Surgery;  Laterality: N/A;   VEIN HARVEST Right 05/22/2020   Procedure: Open Vein Harvest;  Surgeon: Shyrl Linnie KIDD, MD;  Location: [facility] OR;  Service: Open Heart Surgery;  Laterality: Right;   VENTRAL HERNIA REPAIR  09/01/2017   mistaken entry    Social History   Socioeconomic History   Marital status: Widowed    Spouse name: Not on file   Number of children: 1   Years of education: 37    Highest education level: 12th grade  Occupational History   Occupation: Retired  Tobacco Use   Smoking status: Never    Passive exposure: Never   Smokeless tobacco: Never  Vaping Use   Vaping status: Never Used  Substance and Sexual Activity   Alcohol use: No   Drug use: No   Sexual activity: Not Currently  Other Topics Concern   Not on file  Social History Narrative   Lives alone.  Has 1 daughter      Enjoys reading and cooking      Eats all food groups   Caffeine, some coffee   Water reports drinking pretty well throughout the day      Social Drivers of Health   Financial Resource Strain: Low Risk  (01/26/2024)   Overall Financial Resource Strain (CARDIA)    Difficulty of Paying Living Expenses: Not hard at all  Food Insecurity: No Food Insecurity (01/26/2024)   Hunger Vital Sign    Worried About Running Out of Food in the Last Year: Never true    Ran Out of Food in the Last Year: Never true  Transportation Needs: No Transportation Needs (01/26/2024)   PRAPARE - Administrator, Civil Service (Medical): No    Lack of Transportation (Non-Medical): No  Physical Activity: Insufficiently Active (01/26/2024)   Exercise Vital Sign    Days of Exercise per Week: 3 days    Minutes of Exercise per Session: 20 min  Stress: No Stress Concern Present (01/26/2024)   [employer] of Occupational Health - Occupational Stress Questionnaire    Feeling of Stress : Not at all  Social Connections: Moderately Isolated (01/26/2024)   Social Connection and Isolation Panel    Frequency of Communication with Friends and Family: More than three times a week    Frequency of Social Gatherings with Friends and Family: More than three times a week    Attends Religious Services: More than 4 times per year    Active Member of [employer] or Organizations: No    Attends Banker Meetings: Never    Marital Status: Widowed    Family History  Problem Relation Age of Onset   CAD  Father    Heart failure Sister    CAD Daughter    Heart disease Daughter     Outpatient Encounter Medications as of 10/24/2024  Medication Sig   Accu-Chek Softclix Lancets lancets Use as instructed   acetaminophen  (TYLENOL ) 325 MG tablet Take 650 mg by mouth every 6 (six) hours as needed for moderate pain.   aspirin  EC 81 MG tablet Take 1 tablet (81 mg total) by mouth daily. Swallow whole.   atorvastatin  (LIPITOR) 40 MG tablet take 1 tablet (40 MILLIGRAM total) by mouth at bedtime.   blood glucose meter kit and supplies Dispense based on patient and insurance preference. Use up to four times daily as directed. (FOR ICD-10 E10.9, E11.9).   Blood Pressure Monitor DEVI 1 each by Does not apply route daily.   cetirizine  (ZYRTEC ) 10 MG tablet Take 1 tablet (10 mg total) by mouth daily as needed (allergies).   famotidine  (PEPCID ) 20 MG tablet take 1 tablet (20 milligram total) by mouth daily.   fish oil-omega-3 fatty acids  1000 MG capsule Take 1 g by mouth daily.   glucose blood (ACCU-CHEK GUIDE) test strip Use as instructed to monitor glucose twice daily   glucose blood test strip Use as instructed to monitor glucose twice daily   guaiFENesin -dextromethorphan (ROBITUSSIN DM) 100-10 MG/5ML syrup Take 5 mLs by mouth every 4 (four) hours as needed for cough.   losartan  (COZAAR ) 25 MG tablet take 1 tablet (25 MILLIGRAM total) by mouth daily.   Multiple Vitamin (MULITIVITAMIN WITH MINERALS) TABS Take 1 tablet by mouth daily.   Multiple Vitamins-Minerals (AIRBORNE GUMMIES PO) Take by mouth daily.   nitroGLYCERIN  (NITROSTAT ) 0.4 MG SL tablet place 1 tablet (0.4 MILLIGRAM total) under the tongue every 5 (five) minutes TIMES 3 doses as needed for chest pain (if no relief after 3rd dose, proceed to the ed or call 911).   polyethylene glycol (MIRALAX  / GLYCOLAX ) 17 g packet Take 17 g by mouth daily as needed for mild constipation or moderate constipation.   [DISCONTINUED] linagliptin  (TRADJENTA ) 5 MG TABS  tablet Take 1 tablet by mouth once daily   [DISCONTINUED] metFORMIN  (GLUCOPHAGE ) 500 MG tablet Take 1 tablet (500 mg total) by mouth daily with breakfast.   linagliptin  (TRADJENTA ) 5 MG TABS tablet Take 1 tablet (5 mg total) by mouth daily.   metFORMIN  (GLUCOPHAGE ) 500 MG tablet Take 1 tablet (500 mg total) by mouth daily with breakfast.  No facility-administered encounter medications on file as of 10/24/2024.    ALLERGIES: Allergies  Allergen Reactions   Iodinated Contrast Media Other (See Comments)    Unknown reaction. Due to kidney issues. Do not use Unknown reaction. Due to kidney issues. Do not use   Codeine Rash   Morphine  And Codeine Other (See Comments) and Rash    Unknown Reaction Unknown Reaction   Other     Pt has multiple allergies per pt but unsure of all.    Penicillins Other (See Comments)    Unknown reaction Unknown reaction    VACCINATION STATUS: Immunization History  Administered Date(s) Administered   INFLUENZA, HIGH DOSE SEASONAL PF 11/12/2017, 08/13/2018   Influenza-Unspecified 11/12/2011, 11/03/2012, 11/02/2013, 09/20/2014, 09/19/2015, 09/23/2016, 08/18/2019   Pneumococcal Conjugate-13 09/20/2014   Pneumococcal Polysaccharide-23 03/18/2016    Diabetes She presents for her follow-up diabetic visit. She has type 2 diabetes mellitus. Onset time: Was diagnosed at approx age of 44. Her disease course has been stable. There are no hypoglycemic associated symptoms. Associated symptoms include foot paresthesias. Pertinent negatives for diabetes include no fatigue, no polydipsia and no polyuria. There are no hypoglycemic complications. Symptoms are stable. Diabetic complications include heart disease, nephropathy and peripheral neuropathy. Risk factors for coronary artery disease include diabetes mellitus, dyslipidemia, hypertension, stress, tobacco exposure, sedentary lifestyle and post-menopausal. Current diabetic treatment includes oral agent (dual therapy). She is  compliant with treatment most of the time. Her weight is fluctuating minimally. She is following a generally healthy diet. When asked about meal planning, she reported none. She has not had a previous visit with a dietitian. She rarely participates in exercise. Her home blood glucose trend is fluctuating minimally. Her breakfast blood glucose range is generally 130-140 mg/dl. (She presents today with her meter and logs showing mostly at goal fasting glycemic profile.  Her POCT A1c today is 7.5%, essentially unchanged from last visit.   She notes she continues to work hard on diet and exercise best she can.  She cannot exercise optimally due to hernia.) An ACE inhibitor/angiotensin II receptor blocker is not being taken. She does not see a podiatrist.Eye exam is current.    Review of systems  Constitutional: + stable body weight,  current Body mass index is 28.76 kg/m. , no fatigue, no subjective hyperthermia, no subjective hypothermia Eyes: no blurry vision, no xerophthalmia ENT: no sore throat, no nodules palpated in throat, no dysphagia/odynophagia, no hoarseness Cardiovascular: no chest pain, no shortness of breath, no palpitations, no leg swelling Respiratory: no cough, no shortness of breath Gastrointestinal: no nausea/vomiting/diarrhea, large hernia located to left side-tender to touch Musculoskeletal: no muscle/joint aches Skin: no rashes, no hyperemia Neurological: no tremors, no numbness, no tingling, no dizziness Psychiatric: no depression, no anxiety  Objective:     BP 124/78 (BP Location: Left Arm, Patient Position: Sitting, Cuff Size: Large)   Pulse 60   Ht 4' 11 (1.499 m)   Wt 142 lb 6.4 oz (64.6 kg)   BMI 28.76 kg/m   Wt Readings from Last 3 Encounters:  10/24/24 142 lb 6.4 oz (64.6 kg)  09/28/24 142 lb 12.8 oz (64.8 kg)  07/04/24 143 lb (64.9 kg)     BP Readings from Last 3 Encounters:  10/24/24 124/78  09/28/24 132/64  07/04/24 122/64      Physical Exam-  Limited  Constitutional:  Body mass index is 28.76 kg/m. , not in acute distress, anxious state of mind Eyes:  EOMI, no exophthalmos Musculoskeletal: she does have abnormal bulge  in LLQ of abdomen stretching around her back (inoperable hernia), strength intact in all four extremities, no gross restriction of joint movements Skin:  no rashes, no hyperemia Neurological: no tremor with outstretched hands   Diabetic Foot Exam - Simple   Simple Foot Form Diabetic Foot exam was performed with the following findings: Yes 10/24/2024  1:15 PM  Visual Inspection No deformities, no ulcerations, no other skin breakdown bilaterally: Yes Sensation Testing Intact to touch and monofilament testing bilaterally: Yes Pulse Check Posterior Tibialis and Dorsalis pulse intact bilaterally: Yes Comments      CMP ( most recent) CMP     Component Value Date/Time   NA 137 09/01/2023 1801   NA 141 11/21/2021 1115   K 4.5 09/01/2023 1801   CL 105 09/01/2023 1801   CO2 25 09/01/2023 1801   GLUCOSE 145 (H) 09/01/2023 1801   BUN 28 (H) 09/01/2023 1801   BUN 27 11/21/2021 1115   CREATININE 1.20 (H) 09/01/2023 1801   CREATININE 1.28 (H) 03/07/2020 1137   CALCIUM  8.6 (L) 09/01/2023 1801   CALCIUM  9.2 06/23/2023 1403   PROT 7.0 09/01/2023 1801   PROT 6.8 11/21/2021 1115   ALBUMIN  3.5 09/01/2023 1801   ALBUMIN  4.5 11/21/2021 1115   AST 20 09/01/2023 1801   ALT 21 09/01/2023 1801   ALKPHOS 46 09/01/2023 1801   BILITOT 0.5 09/01/2023 1801   BILITOT 0.3 11/21/2021 1115   GFRNONAA 45 (L) 09/01/2023 1801   GFRNONAA 40 (L) 03/07/2020 1137   GFRAA 48 (L) 12/28/2020 0955   GFRAA 46 (L) 03/07/2020 1137     Diabetic Labs (most recent): Lab Results  Component Value Date   HGBA1C 7.5 (A) 10/24/2024   HGBA1C 7.4 (A) 06/20/2024   HGBA1C 7.8 (A) 02/16/2024   MICROALBUR 30 02/27/2021   MICROALBUR 2.4 03/07/2020   MICROALBUR 0.9 08/13/2018     Lipid Panel ( most recent) Lipid Panel     Component  Value Date/Time   CHOL 100 04/15/2023 1414   CHOL 121 11/21/2021 1115   TRIG 102 04/15/2023 1414   HDL 37 (L) 04/15/2023 1414   HDL 39 (L) 11/21/2021 1115   CHOLHDL 2.7 04/15/2023 1414   VLDL 20 04/15/2023 1414   LDLCALC 43 04/15/2023 1414   LDLCALC 55 11/21/2021 1115   LDLCALC 109 (H) 03/07/2020 1137   LABVLDL 27 11/21/2021 1115      Lab Results  Component Value Date   TSH 2.469 05/21/2022   TSH 3.040 08/22/2021   TSH 2.02 11/17/2019   FREET4 1.25 08/22/2021           Assessment & Plan:   1) Controlled Type 2 Diabetes with CKD stage 3  - [name] [name] has currently uncontrolled symptomatic type 2 DM since 83 years of age.  She presents today with her meter and logs showing mostly at goal fasting glycemic profile.  Her POCT A1c today is 7.5%, essentially unchanged from last visit.   She notes she continues to work hard on diet and exercise best she can.  She cannot exercise optimally due to hernia.  She sees her kidney specialist tomorrow.  -Recent labs reviewed.  - I had a long discussion with her about the progressive nature of diabetes and the pathology behind its complications. -her diabetes is complicated by CAD and she remains at a high risk for more acute and chronic complications which include CAD, CVA, CKD, retinopathy, and neuropathy. These are all discussed in detail with her.  - Nutritional counseling  repeated/built upon at each appointment.  - The patient admits there is a room for improvement in their diet and drink choices. -  Suggestion is made for the patient to avoid simple carbohydrates from their diet including Cakes, Sweet Desserts / Pastries, Ice Cream, Soda (diet and regular), Sweet Tea, Candies, Chips, Cookies, Sweet Pastries, Store Bought Juices, Alcohol in Excess of 1-2 drinks a day, Artificial Sweeteners, Coffee Creamer, and Sugar-free Products. This will help patient to have stable blood glucose profile and potentially avoid unintended weight  gain.   - I encouraged the patient to switch to unprocessed or minimally processed complex starch and increased protein intake (animal or plant source), fruits, and vegetables.   - Patient is advised to stick to a routine mealtimes to eat 3 meals a day and avoid unnecessary snacks (to snack only to correct hypoglycemia).  - she will be scheduled with Penny Crumpton, RDN, CDE for diabetes education.  - I have approached her with the following individualized plan to manage  her diabetes and patient agrees:   -Given her stable, at goal glycemic profile overall, she can continue her current medication regimen of Metformin  500 mg po once daily with breakfast, Tradjenta  5 mg po daily with breakfast.   Goal A1c for her would be around 7%.  -she is encouraged to continue monitoring blood glucose at least once daily, before breakfast, and to call the clinic if she has readings less than 70 or above 200 for 3 tests in a row.  - Specific targets for  A1c;  LDL, HDL,  and Triglycerides were discussed with the patient.  2) Blood Pressure /Hypertension:  her blood pressure is controlled to target for her age.   she is not currently on any antihypertensive medications at this time.  3) Lipids/Hyperlipidemia:    Review of her recent lipid panel from 04/15/23 showed controlled  LDL at 43.  she  is advised to continue Lipitor 40 mg daily at bedtime and Fish Oil supplement.  Side effects and precautions discussed with her.    4)  Weight/Diet:  her Body mass index is 28.76 kg/m.  -  clearly complicating her diabetes care.   she is a candidate for weight loss. I discussed with her the fact that loss of 5 - 10% of her  current body weight will have the most impact on her diabetes management.  Exercise, and detailed carbohydrates information provided  -  detailed on discharge instructions.  5) Hypervitaminosis of vitamin D  Her most recent vitamin D  level on 08/22/21 was high at 113.  She was taking several  supplements that contained vitamin D  in it.  The nephrologist asked her to stop all vitamin D  supplements.  6) Chronic Care/Health Maintenance: -she is on Statin medications and is encouraged to initiate and continue to follow up with Ophthalmology, Dentist, Podiatrist at least yearly or according to recommendations, and advised to stay away from smoking. I have recommended yearly flu vaccine and pneumonia vaccine at least every 5 years; moderate intensity exercise for up to 150 minutes weekly; and sleep for at least 7 hours a day.  - she is advised to maintain close follow up with Bevely Doffing, FNP for primary care needs, as well as her other providers for optimal and coordinated care.     I spent  31  minutes in the care of the patient today including review of labs from CMP, Lipids, Thyroid  Function, Hematology (current and previous including abstractions from other facilities); face-to-face time  discussing  her blood glucose readings/logs, discussing hypoglycemia and hyperglycemia episodes and symptoms, medications doses, her options of short and long term treatment based on the latest standards of care / guidelines;  discussion about incorporating lifestyle medicine;  and documenting the encounter. Risk reduction counseling performed per USPSTF guidelines to reduce obesity and cardiovascular risk factors.     Please refer to Patient Instructions for Blood Glucose Monitoring and Insulin /Medications Dosing Guide  in media tab for additional information. Please  also refer to  Patient Self Inventory in the Media  tab for reviewed elements of pertinent patient history.  [name] [name] participated in the discussions, expressed understanding, and voiced agreement with the above plans.  All questions were answered to her satisfaction. she is encouraged to contact clinic should she have any questions or concerns prior to her return visit.   Follow up plan: - Return in about 4 months (around  02/21/2025) for Diabetes F/U with A1c in office, No previsit labs, Bring meter and logs.  Benton Rio, [facility] [address] [place], [place] [address] Phone: [phone] Fax: [phone]  10/24/2024, 1:27 PM

## 2024-10-25 ENCOUNTER — Other Ambulatory Visit: Payer: Self-pay | Admitting: Cardiology

## 2024-10-25 DIAGNOSIS — Z905 Acquired absence of kidney: Secondary | ICD-10-CM | POA: Diagnosis not present

## 2024-10-25 DIAGNOSIS — E1122 Type 2 diabetes mellitus with diabetic chronic kidney disease: Secondary | ICD-10-CM | POA: Diagnosis not present

## 2024-10-25 DIAGNOSIS — N1832 Chronic kidney disease, stage 3b: Secondary | ICD-10-CM | POA: Diagnosis not present

## 2024-10-25 DIAGNOSIS — I129 Hypertensive chronic kidney disease with stage 1 through stage 4 chronic kidney disease, or unspecified chronic kidney disease: Secondary | ICD-10-CM | POA: Diagnosis not present

## 2024-10-26 ENCOUNTER — Other Ambulatory Visit: Payer: Self-pay | Admitting: Cardiology

## 2024-10-27 ENCOUNTER — Other Ambulatory Visit: Payer: Self-pay | Admitting: Cardiology

## 2024-10-30 ENCOUNTER — Other Ambulatory Visit: Payer: Self-pay

## 2024-11-03 DIAGNOSIS — E1140 Type 2 diabetes mellitus with diabetic neuropathy, unspecified: Secondary | ICD-10-CM | POA: Diagnosis not present

## 2024-11-03 DIAGNOSIS — E1151 Type 2 diabetes mellitus with diabetic peripheral angiopathy without gangrene: Secondary | ICD-10-CM | POA: Diagnosis not present

## 2024-11-08 ENCOUNTER — Ambulatory Visit

## 2024-11-08 VITALS — BP 137/62 | HR 59 | Resp 16 | Ht 59.0 in | Wt 141.0 lb

## 2024-11-08 DIAGNOSIS — K5900 Constipation, unspecified: Secondary | ICD-10-CM

## 2024-11-08 DIAGNOSIS — R600 Localized edema: Secondary | ICD-10-CM

## 2024-11-08 DIAGNOSIS — E119 Type 2 diabetes mellitus without complications: Secondary | ICD-10-CM | POA: Diagnosis not present

## 2024-11-08 DIAGNOSIS — I10 Essential (primary) hypertension: Secondary | ICD-10-CM | POA: Diagnosis not present

## 2024-11-08 DIAGNOSIS — E1121 Type 2 diabetes mellitus with diabetic nephropathy: Secondary | ICD-10-CM

## 2024-11-08 NOTE — Progress Notes (Signed)
 Established Patient Office Visit  Subjective   Patient ID: GWENLYN [name], female    DOB: [birth year]  Age: 83 y.o. MRN: [id number]  Chief Complaint  Patient presents with   Medical Management of Chronic Issues    4 month follow up     HPI Discussed the use of AI scribe software for clinical note transcription with the patient, who gave verbal consent to proceed.  History of Present Illness    [name] [name] is an 83 year old female who presents for a four-month follow-up visit.  Musculoskeletal pain and edema - Occasional pain rated at 5/10 - No weakness on one side of the body - Swelling present on the left side - No swelling in the legs - Elevates legs when possible and avoids prolonged standing - Performs regular leg and foot exercises to maintain circulation  Gastrointestinal symptoms - Occasional heartburn, particularly after eating large meals - Heartburn affects sleep - Continues daily use of Miralax  and Colace  Glycemic control - Blood glucose levels stable, ranging from 120 to 127 mg/dL  Cardiovascular care - Under the care of a cardiologist  Respiratory status - No history of COPD or breathing issues     Patient Active Problem List   Diagnosis Date Noted   Constipation 11/13/2024   Edema of lower extremity 11/13/2024   OSA (obstructive sleep apnea) 07/31/2023   Essential hypertension 06/08/2023   Coronary artery disease 06/08/2023   Elevated troponin 04/24/2023   Tick bite of lower leg 04/24/2023   Bronchitis 02/18/2023   PAD (peripheral artery disease) 08/21/2022   Overweight (BMI 25.0-29.9) 08/21/2022   Right hip pain 08/21/2022   Pain of right thumb 11/27/2021   TIA (transient ischemic attack) 07/01/2021   Chest pain 06/30/2021   Medication management 12/12/2020   Mixed hyperlipidemia 11/27/2020   Left lower quadrant abdominal mass 11/27/2020   Abdominal wall mass of left flank 11/27/2020   Acute pain of left shoulder 10/03/2020   Joint  stiffness of hand, left 10/03/2020   Gastro-esophageal reflux disease with esophagitis    Major depressive disorder, single episode, unspecified    Unspecified osteoarthritis, unspecified site    Atherosclerotic heart disease of native coronary artery without angina pectoris    Chronic kidney disease, stage 3b (HCC)    Hypertensive heart disease without heart failure    Type 2 diabetes mellitus with diabetic nephropathy (HCC)    Ventral hernia without obstruction or gangrene 09/01/2017   Incarcerated ventral hernia     ROS    Objective:     BP 137/62   Pulse (!) 59   Resp 16   Ht 4' 11 (1.499 m)   Wt 141 lb 0.3 oz (64 kg)   SpO2 97%   BMI 28.48 kg/m  BP Readings from Last 3 Encounters:  11/08/24 137/62  10/24/24 124/78  09/28/24 132/64   Wt Readings from Last 3 Encounters:  11/08/24 141 lb 0.3 oz (64 kg)  10/24/24 142 lb 6.4 oz (64.6 kg)  09/28/24 142 lb 12.8 oz (64.8 kg)     Physical Exam Vitals and nursing note reviewed.  Constitutional:      Appearance: Normal appearance.  HENT:     Head: Normocephalic.  Eyes:     Extraocular Movements: Extraocular movements intact.     Pupils: Pupils are equal, round, and reactive to light.  Cardiovascular:     Rate and Rhythm: Normal rate and regular rhythm.  Pulmonary:     Effort: Pulmonary effort  is normal.     Breath sounds: Normal breath sounds.  Musculoskeletal:     Cervical back: Normal range of motion and neck supple.     Right lower leg: 1+ Pitting Edema present.     Left lower leg: 1+ Pitting Edema present.  Neurological:     Mental Status: She is alert and oriented to person, place, and time.  Psychiatric:        Mood and Affect: Mood normal.        Thought Content: Thought content normal.    No results found for any visits on 11/08/24.    The ASCVD Risk score (Arnett DK, et al., 2019) failed to calculate for the following reasons:   The 2019 ASCVD risk score is only valid for ages 3 to 29     Assessment & Plan:   Problem List Items Addressed This Visit       Cardiovascular and Mediastinum   Essential hypertension   Blood pressure slightly elevated but not concerning. Managed by cardiologist. - Continue current antihypertensive regimen as prescribed by cardiologist.        Endocrine   Type 2 diabetes mellitus with diabetic nephropathy (HCC) - Primary   Blood sugar levels slightly elevated at 127 mg/dL. Managed by cardiologist. - Continue current diabetes management plan as prescribed by cardiologist.        Other   Constipation   Managed with daily Miralax  and Colace. - Continue Miralax  and Colace daily.      Edema of lower extremity   Mild edema in lower extremities. - Continue leg elevation and exercise to manage edema.      Return in about 1 year (around 11/08/2025) for needs AWV, chronic follow-up with PCP.    Leita Longs, FNP

## 2024-11-13 DIAGNOSIS — R600 Localized edema: Secondary | ICD-10-CM | POA: Insufficient documentation

## 2024-11-13 DIAGNOSIS — K5900 Constipation, unspecified: Secondary | ICD-10-CM | POA: Insufficient documentation

## 2024-11-13 NOTE — Assessment & Plan Note (Signed)
 Managed with daily Miralax  and Colace. - Continue Miralax  and Colace daily.

## 2024-11-13 NOTE — Assessment & Plan Note (Signed)
 Blood sugar levels slightly elevated at 127 mg/dL. Managed by cardiologist. - Continue current diabetes management plan as prescribed by cardiologist.

## 2024-11-13 NOTE — Assessment & Plan Note (Signed)
 Blood pressure slightly elevated but not concerning. Managed by cardiologist. - Continue current antihypertensive regimen as prescribed by cardiologist.

## 2024-11-13 NOTE — Assessment & Plan Note (Signed)
 Mild edema in lower extremities. - Continue leg elevation and exercise to manage edema.

## 2024-11-18 ENCOUNTER — Telehealth: Payer: Self-pay | Admitting: Cardiology

## 2024-11-18 NOTE — Telephone Encounter (Signed)
*  STAT* If patient is at the pharmacy, call can be transferred to refill team.   1. Which medications need to be refilled? (please list name of each medication and dose if known) atorvastatin  (LIPITOR) 40 MG tablet  take 1 tablet (40 MILLIGRAM total) by mouth at bedtime.   2. Would you like to learn more about the convenience, safety, & potential cost savings by using the [facility]? No   3. Are you open to using the [facility] No  4. Which pharmacy/location (including street and city if local pharmacy) is medication to be sent to?[facility] - [place], [place] - [address]    5. Do they need a 30 day or 90 day supply? 90 Day Supply  Pt is currently out of medication.

## 2024-11-21 MED ORDER — ATORVASTATIN CALCIUM 40 MG PO TABS: 40.0000 mg | ORAL_TABLET | Freq: Every day | ORAL | 3 refills | Status: AC

## 2024-11-21 NOTE — Telephone Encounter (Signed)
 Refill sent

## 2024-11-28 ENCOUNTER — Other Ambulatory Visit: Payer: Self-pay

## 2024-11-28 DIAGNOSIS — I10 Essential (primary) hypertension: Secondary | ICD-10-CM

## 2024-11-28 DIAGNOSIS — N1832 Chronic kidney disease, stage 3b: Secondary | ICD-10-CM

## 2024-12-01 ENCOUNTER — Other Ambulatory Visit: Payer: Self-pay

## 2025-01-02 ENCOUNTER — Other Ambulatory Visit: Payer: Self-pay

## 2025-01-19 ENCOUNTER — Telehealth: Payer: Self-pay

## 2025-01-19 NOTE — Telephone Encounter (Signed)
 Copied from CRM [id number]. Topic: General - Other >> Jan 19, 2025  1:24 PM Carla L wrote: Reason for CRM: Patient's daughter states she does not use mychart and to reach out to them via telephone for any changes in any appointments going forward.   They were not made aware of change in AWV. Daughter wants to make sure there is a note in patient's chart that she does not use mychart.   Best callback number: [phone]

## 2025-01-30 ENCOUNTER — Ambulatory Visit: Payer: Medicare Other

## 2025-02-22 ENCOUNTER — Ambulatory Visit: Admitting: Nurse Practitioner

## 2025-03-06 ENCOUNTER — Ambulatory Visit

## 2025-11-15 ENCOUNTER — Ambulatory Visit
